# Patient Record
Sex: Male | Born: 1935 | Race: White | Hispanic: No | Marital: Married | Smoking: Never smoker
Health system: Southern US, Community
[De-identification: ages and names within clinical notes are randomized; demographics above are authoritative.]

## PROBLEM LIST (undated history)

## (undated) DIAGNOSIS — D126 Benign neoplasm of colon, unspecified: Secondary | ICD-10-CM

## (undated) DIAGNOSIS — Z8719 Personal history of other diseases of the digestive system: Secondary | ICD-10-CM

## (undated) DIAGNOSIS — I209 Angina pectoris, unspecified: Secondary | ICD-10-CM

## (undated) DIAGNOSIS — E1169 Type 2 diabetes mellitus with other specified complication: Secondary | ICD-10-CM

## (undated) DIAGNOSIS — M1990 Unspecified osteoarthritis, unspecified site: Secondary | ICD-10-CM

## (undated) DIAGNOSIS — K5790 Diverticulosis of intestine, part unspecified, without perforation or abscess without bleeding: Secondary | ICD-10-CM

## (undated) DIAGNOSIS — C61 Malignant neoplasm of prostate: Secondary | ICD-10-CM

## (undated) DIAGNOSIS — R011 Cardiac murmur, unspecified: Secondary | ICD-10-CM

## (undated) DIAGNOSIS — I2510 Atherosclerotic heart disease of native coronary artery without angina pectoris: Secondary | ICD-10-CM

## (undated) DIAGNOSIS — E785 Hyperlipidemia, unspecified: Secondary | ICD-10-CM

## (undated) DIAGNOSIS — K219 Gastro-esophageal reflux disease without esophagitis: Secondary | ICD-10-CM

## (undated) DIAGNOSIS — R29890 Loss of height: Secondary | ICD-10-CM

## (undated) HISTORY — DX: Diverticulosis of intestine, part unspecified, without perforation or abscess without bleeding: K57.90

## (undated) HISTORY — PX: PROSTATECTOMY: SHX69

## (undated) HISTORY — PX: COLONOSCOPY: SHX174

## (undated) HISTORY — DX: Malignant neoplasm of prostate: C61

## (undated) HISTORY — DX: Unspecified osteoarthritis, unspecified site: M19.90

## (undated) HISTORY — DX: Hyperlipidemia, unspecified: E78.5

## (undated) HISTORY — PX: TONSILLECTOMY: SHX5217

## (undated) HISTORY — DX: Loss of height: R29.890

## (undated) HISTORY — DX: Type 2 diabetes mellitus with other specified complication: E11.69

## (undated) HISTORY — DX: Benign neoplasm of colon, unspecified: D12.6

---
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
## 2004-07-27 ENCOUNTER — Ambulatory Visit: Payer: Self-pay | Admitting: Gastroenterology

## 2004-08-08 ENCOUNTER — Ambulatory Visit: Payer: Self-pay | Admitting: Gastroenterology

## 2007-10-12 ENCOUNTER — Telehealth (INDEPENDENT_AMBULATORY_CARE_PROVIDER_SITE_OTHER): Payer: Self-pay | Admitting: *Deleted

## 2007-10-15 ENCOUNTER — Ambulatory Visit: Payer: Self-pay | Admitting: Family Medicine

## 2007-10-15 DIAGNOSIS — R42 Dizziness and giddiness: Secondary | ICD-10-CM

## 2007-10-15 DIAGNOSIS — Z8546 Personal history of malignant neoplasm of prostate: Secondary | ICD-10-CM

## 2007-10-15 DIAGNOSIS — R030 Elevated blood-pressure reading, without diagnosis of hypertension: Secondary | ICD-10-CM | POA: Insufficient documentation

## 2007-10-15 HISTORY — DX: Dizziness and giddiness: R42

## 2007-10-15 HISTORY — DX: Personal history of malignant neoplasm of prostate: Z85.46

## 2007-11-11 ENCOUNTER — Ambulatory Visit: Payer: Self-pay | Admitting: Family Medicine

## 2007-11-17 ENCOUNTER — Ambulatory Visit: Payer: Self-pay | Admitting: Gastroenterology

## 2007-11-17 DIAGNOSIS — Z860100 Personal history of colon polyps, unspecified: Secondary | ICD-10-CM

## 2007-11-17 HISTORY — DX: Personal history of colonic polyps: Z86.010

## 2007-11-17 HISTORY — DX: Personal history of colon polyps, unspecified: Z86.0100

## 2007-11-26 ENCOUNTER — Ambulatory Visit: Payer: Self-pay | Admitting: Gastroenterology

## 2007-11-26 ENCOUNTER — Encounter: Payer: Self-pay | Admitting: Gastroenterology

## 2007-11-26 ENCOUNTER — Encounter: Payer: Self-pay | Admitting: Family Medicine

## 2007-11-26 LAB — HM COLONOSCOPY

## 2008-01-20 ENCOUNTER — Ambulatory Visit: Payer: Self-pay | Admitting: Internal Medicine

## 2008-01-20 ENCOUNTER — Telehealth: Payer: Self-pay | Admitting: Gastroenterology

## 2008-01-20 DIAGNOSIS — K5289 Other specified noninfective gastroenteritis and colitis: Secondary | ICD-10-CM | POA: Insufficient documentation

## 2008-01-20 HISTORY — DX: Other specified noninfective gastroenteritis and colitis: K52.89

## 2008-02-05 ENCOUNTER — Telehealth (INDEPENDENT_AMBULATORY_CARE_PROVIDER_SITE_OTHER): Payer: Self-pay | Admitting: *Deleted

## 2008-02-11 ENCOUNTER — Ambulatory Visit: Payer: Self-pay | Admitting: Family Medicine

## 2008-02-11 DIAGNOSIS — E559 Vitamin D deficiency, unspecified: Secondary | ICD-10-CM

## 2008-02-11 DIAGNOSIS — K449 Diaphragmatic hernia without obstruction or gangrene: Secondary | ICD-10-CM

## 2008-02-11 DIAGNOSIS — R011 Cardiac murmur, unspecified: Secondary | ICD-10-CM | POA: Insufficient documentation

## 2008-02-11 HISTORY — DX: Vitamin D deficiency, unspecified: E55.9

## 2008-02-11 HISTORY — DX: Diaphragmatic hernia without obstruction or gangrene: K44.9

## 2008-02-12 ENCOUNTER — Encounter (INDEPENDENT_AMBULATORY_CARE_PROVIDER_SITE_OTHER): Payer: Self-pay | Admitting: *Deleted

## 2008-02-13 LAB — CONVERTED CEMR LAB
ALT: 57 units/L — ABNORMAL HIGH (ref 0–53)
AST: 42 units/L — ABNORMAL HIGH (ref 0–37)
Albumin: 4.6 g/dL (ref 3.5–5.2)
CO2: 28 meq/L (ref 19–32)
Cholesterol: 214 mg/dL (ref 0–200)
GFR calc Af Amer: 92 mL/min
Glucose, Bld: 104 mg/dL — ABNORMAL HIGH (ref 70–99)
HDL: 28.6 mg/dL — ABNORMAL LOW (ref >39.0)
Lymphocytes Relative: 33.6 % (ref 12.0–46.0)
Monocytes Absolute: 0.4 10*3/uL (ref 0.1–1.0)
Monocytes Relative: 7.9 % (ref 3.0–12.0)
Neutrophils Relative %: 55.9 % (ref 43.0–77.0)
Platelets: 283 10*3/uL (ref 150–400)
Potassium: 4.9 meq/L (ref 3.5–5.1)
RDW: 12.7 % (ref 11.5–14.6)
Sodium: 146 meq/L — ABNORMAL HIGH (ref 135–145)
TSH: 5.05 microintl units/mL (ref 0.35–5.50)
Total CHOL/HDL Ratio: 7.5
Triglycerides: 230 mg/dL (ref 0–149)
VLDL: 46 mg/dL — ABNORMAL HIGH (ref 0–40)
Vit D, 1,25-Dihydroxy: 26 — ABNORMAL LOW (ref 30–89)

## 2008-02-15 ENCOUNTER — Encounter (INDEPENDENT_AMBULATORY_CARE_PROVIDER_SITE_OTHER): Payer: Self-pay | Admitting: *Deleted

## 2008-02-18 ENCOUNTER — Encounter: Payer: Self-pay | Admitting: Family Medicine

## 2008-02-18 ENCOUNTER — Ambulatory Visit: Payer: Self-pay

## 2008-02-22 ENCOUNTER — Telehealth (INDEPENDENT_AMBULATORY_CARE_PROVIDER_SITE_OTHER): Payer: Self-pay | Admitting: *Deleted

## 2009-09-18 ENCOUNTER — Encounter: Payer: Self-pay | Admitting: Family Medicine

## 2010-07-12 ENCOUNTER — Encounter: Payer: Self-pay | Admitting: Family Medicine

## 2010-07-12 ENCOUNTER — Ambulatory Visit: Payer: Self-pay | Admitting: Family Medicine

## 2010-07-12 DIAGNOSIS — D239 Other benign neoplasm of skin, unspecified: Secondary | ICD-10-CM | POA: Insufficient documentation

## 2010-07-12 DIAGNOSIS — M1990 Unspecified osteoarthritis, unspecified site: Secondary | ICD-10-CM

## 2010-07-12 HISTORY — DX: Other benign neoplasm of skin, unspecified: D23.9

## 2010-07-12 HISTORY — DX: Unspecified osteoarthritis, unspecified site: M19.90

## 2010-07-12 LAB — CONVERTED CEMR LAB
Bilirubin Urine: NEGATIVE
Ketones, urine, test strip: NEGATIVE
Nitrite: NEGATIVE
Specific Gravity, Urine: 1.02
Urobilinogen, UA: NEGATIVE

## 2010-07-13 ENCOUNTER — Telehealth: Payer: Self-pay | Admitting: Family Medicine

## 2010-07-13 LAB — CONVERTED CEMR LAB
Albumin: 4.3 g/dL (ref 3.5–5.2)
Alkaline Phosphatase: 66 units/L (ref 39–117)
Basophils Absolute: 0 10*3/uL (ref 0.0–0.1)
Basophils Relative: 0.3 % (ref 0.0–3.0)
CO2: 26 meq/L (ref 19–32)
Chloride: 105 meq/L (ref 96–112)
Eosinophils Absolute: 0.1 10*3/uL (ref 0.0–0.7)
Glucose, Bld: 87 mg/dL (ref 70–99)
HCT: 47.7 % (ref 39.0–52.0)
Hemoglobin: 16.4 g/dL (ref 13.0–17.0)
Lymphocytes Relative: 28.2 % (ref 12.0–46.0)
Lymphs Abs: 1.6 10*3/uL (ref 0.7–4.0)
MCHC: 34.3 g/dL (ref 30.0–36.0)
MCV: 92.6 fL (ref 78.0–100.0)
Monocytes Absolute: 0.4 10*3/uL (ref 0.1–1.0)
Neutro Abs: 3.5 10*3/uL (ref 1.4–7.7)
PSA: 0.01 ng/mL — ABNORMAL LOW (ref 0.10–4.00)
RBC: 5.15 M/uL (ref 4.22–5.81)
RDW: 12.9 % (ref 11.5–14.6)
Sodium: 139 meq/L (ref 135–145)
Total CHOL/HDL Ratio: 5
Total Protein: 7 g/dL (ref 6.0–8.3)
Triglycerides: 171 mg/dL — ABNORMAL HIGH (ref 0.0–149.0)
Vit D, 25-Hydroxy: 35 ng/mL (ref 30–89)

## 2010-07-18 ENCOUNTER — Encounter: Payer: Self-pay | Admitting: Family Medicine

## 2010-07-22 HISTORY — PX: EYE SURGERY: SHX253

## 2010-08-21 NOTE — Letter (Signed)
Summary: [facility]  [facility]   Imported By: Lanelle Bal 09/26/2009 12:02:07  _____________________________________________________________________  External Attachment:    Type:   Image     Comment:   External Document

## 2010-08-23 NOTE — Assessment & Plan Note (Signed)
Summary: EST MEDICARE//PH   Vital Signs:  Patient profile:   77 year old male Height:      70 inches Weight:      188.8 pounds BMI:     27.19 Pulse rate:   64 / minute Pulse rhythm:   regular BP sitting:   118 / 80  (left arm) Cuff size:   regular  Vitals Entered By: Almeta Monas CMA Duncan Dull) (July 12, 2010 9:48 AM) CC: cpx/fasting---no problems  Does patient need assistance? Functional Status Self care, Cook/clean, Shopping, Social activities Ambulation Normal Comments pt is able to do all adls and can read and write  Vision Screening:      Vision Comments: + cataracts---sees Dr Hazle Quant 40db HL: Left  Right  Audiometry Comment: grossly normal    History of Present Illness: Pt here for cpe and labs.  [facility] and GE exam.  Pt seeing Dr Hazle Quant for cataracts.     Preventive Screening-Counseling & Management  Alcohol-Tobacco     Alcohol drinks/day: 0     Smoking Status: never  Caffeine-Diet-Exercise     Caffeine use/day: 2     Does Patient Exercise: no     Exercise Counseling: to improve exercise regimen  Hep-HIV-STD-Contraception     Dental Visit-last 6 months yes     Dental Care Counseling: not indicated; dental care within six months  Safety-Violence-Falls     Firearms in the Home: firearms in the home     Firearm Counseling: not indicated; uses recommended firearm safety measures     Smoke Detectors: yes     Smoke Detector Counseling: n/a     Violence in the Home: no risk noted     Sexual Abuse: no     Fall Risk: no      Sexual History:  currently monogamous.    Problems Prior to Update: 1)  Preventive Health Care  (ICD-V70.0) 2)  Osteoarthritis  (ICD-715.90) 3)  Unspecified Vitamin D Deficiency  (ICD-268.9) 4)  Cardiac Murmur  (ICD-785.2) 5)  Hiatal Hernia With Reflux  (ICD-553.3) 6)  Gastroenteritis  (ICD-558.9) 7)  Personal History of Colonic Polyps  (ICD-V12.72) 8)  Elevated Blood Pressure Without Diagnosis of  Hypertension  (ICD-796.2) 9)  Dizziness  (ICD-780.4) 10)  Prostate Cancer, Hx of  (ICD-V10.46)  Medications Prior to Update: 1)  Baby Aspirin 81 Mg  Chew (Aspirin) .[name].. 1 By Mouth Once Daily 2)  Fish Oil   Oil (Fish Oil) .[name].. 1 Tablet Once Daily 3)  Meclizine Hcl 25 Mg  Tabs (Meclizine Hcl) .[name].. 1 Q 6 Hrs As Needed Dizziness 4)  Prevacid 30 Mg  Cpdr (Lansoprazole) .[name].. 1 By Mouth Once Daily 5)  Vitamin D 04540 Unit  Caps (Ergocalciferol) .... Take One Capsule Weekly.  Current Medications (verified): 1)  Baby Aspirin 81 Mg  Chew (Aspirin) .[name].. 1 By Mouth Once Daily 2)  Caltrate 600 1500 Mg Tabs (Calcium Carbonate) .... By Mouth Once Daily 3)  Osteo Bi-Flex Regular Strength 250-200 Mg Tabs (Glucosamine-Chondroitin) .... By Mouth Once Daily 4)  Multivitamins  Caps (Multiple Vitamin) .... By Mouth Once Daily 5)  Zostavax 98119 Unt/0.32ml Solr (Zoster Vaccine Live) .[name].. 1 Ml Im X1 6)  Boostrix 5-2.5-18.5 Susp (Tetanus-Diphth-Acell Pertussis) .... Im X1  Allergies (verified): 1)  ! Pcn  Past History:  Past Surgical History: Last updated: 10/15/2007 Appendectomy Prostatectomy Tonsillectomy  Family History: Last updated: 10/15/2007 Family History Lung cancer Family History of Prostate CA 1st degree relative <50  Social History: Last updated: 10/15/2007 Married Never Smoked Alcohol use-no  Risk Factors: Alcohol Use: 0 (07/12/2010) Caffeine Use: 2 (07/12/2010) Exercise: no (07/12/2010)  Risk Factors: Smoking Status: never (07/12/2010)  Past Medical History: Prostate cancer, hx of Osteoarthritis  Family History: Reviewed history from 10/15/2007 and no changes required. Family History Lung cancer Family History of Prostate CA 1st degree relative <50  Social History: Reviewed history from 10/15/2007 and no changes required. Married Never Smoked Alcohol use-no Does Patient Exercise:  no Caffeine use/day:  2 Dental Care w/in 6 mos.:  yes Fall Risk:  no Sexual  History:  currently monogamous  Review of Systems      See HPI General:  Denies chills, fatigue, fever, loss of appetite, malaise, sleep disorder, sweats, weakness, and weight loss. Eyes:  Complains of blurring; denies discharge, double vision, eye irritation, eye pain, halos, itching, light sensitivity, red eye, vision loss-1 eye, and vision loss-both eyes. ENT:  Denies decreased hearing, difficulty swallowing, ear discharge, earache, hoarseness, nasal congestion, nosebleeds, postnasal drainage, ringing in ears, sinus pressure, and sore throat. CV:  Denies bluish discoloration of lips or nails, chest pain or discomfort, difficulty breathing at night, difficulty breathing while lying down, fainting, fatigue, leg cramps with exertion, lightheadness, near fainting, palpitations, shortness of breath with exertion, swelling of feet, swelling of hands, and weight gain. Resp:  Denies chest discomfort, chest pain with inspiration, cough, coughing up blood, excessive snoring, hypersomnolence, morning headaches, pleuritic, shortness of breath, sputum productive, and wheezing. GI:  Denies abdominal pain, bloody stools, change in bowel habits, constipation, dark tarry stools, diarrhea, excessive appetite, gas, hemorrhoids, indigestion, loss of appetite, nausea, vomiting, vomiting blood, and yellowish skin color. GU:  Denies decreased libido, discharge, dysuria, erectile dysfunction, genital sores, hematuria, incontinence, nocturia, urinary frequency, and urinary hesitancy. MS:  Denies joint pain, joint redness, joint swelling, loss of strength, low back pain, mid back pain, muscle aches, muscle , cramps, muscle weakness, stiffness, and thoracic pain. Derm:  Denies changes in color of skin, changes in nail beds, dryness, excessive perspiration, flushing, hair loss, insect bite(s), itching, lesion(s), poor wound healing, and rash. Psych:  Denies alternate hallucination ( auditory/visual), anxiety, depression, easily  angered, easily tearful, irritability, mental problems, panic attacks, sense of great danger, suicidal thoughts/plans, thoughts of violence, unusual visions or sounds, and thoughts /plans of harming others. Endo:  Denies cold intolerance, excessive hunger, excessive thirst, excessive urination, heat intolerance, polyuria, and weight change. Heme:  Denies abnormal bruising, bleeding, enlarge lymph nodes, fevers, pallor, and skin discoloration. Allergy:  Denies hives or rash, itching eyes, persistent infections, seasonal allergies, and sneezing.   Impression & Recommendations:  Problem # 1:  PREVENTIVE HEALTH CARE (ICD-V70.0) ghm utd Orders: Venipuncture (16109) TLB-Lipid Panel (80061-LIPID) TLB-BMP (Basic Metabolic Panel-BMET) (80048-METABOL) TLB-CBC Platelet - w/Differential (85025-CBCD) TLB-Hepatic/Liver Function Pnl (80076-HEPATIC) TLB-PSA (Prostate Specific Antigen) (84153-PSA) UA Dipstick W/ Micro (manual) (60454) Medicare -1st Annual Wellness Visit [phone]) EKG w/ Interpretation (93000)  Reviewed preventive care protocols, scheduled due services, and updated immunizations.  Problem # 2:  UNSPECIFIED VITAMIN D DEFICIENCY (ICD-268.9)  Orders: Venipuncture (91478) TLB-Lipid Panel (80061-LIPID) TLB-BMP (Basic Metabolic Panel-BMET) (80048-METABOL) TLB-CBC Platelet - w/Differential (85025-CBCD) TLB-Hepatic/Liver Function Pnl (80076-HEPATIC) TLB-PSA (Prostate Specific Antigen) (84153-PSA) T-Vitamin D (25-Hydroxy) (29562-13086) Specimen Handling (57846)  Problem # 3:  PROSTATE CANCER, HX OF (ICD-V10.46)  Orders: Venipuncture (96295) TLB-Lipid Panel (80061-LIPID) TLB-BMP (Basic Metabolic Panel-BMET) (80048-METABOL) TLB-CBC Platelet - w/Differential (85025-CBCD) TLB-Hepatic/Liver Function Pnl (80076-HEPATIC) TLB-PSA (Prostate Specific Antigen) (84153-PSA) T-Vitamin D (25-Hydroxy) (28413-24401) Venipuncture (02725) TLB-Lipid  Panel (80061-LIPID) TLB-BMP (Basic Metabolic  Panel-BMET) (80048-METABOL) TLB-CBC Platelet - w/Differential (85025-CBCD) TLB-Hepatic/Liver Function Pnl (80076-HEPATIC) TLB-PSA (Prostate Specific Antigen) (84153-PSA) Specimen Handling (99000)  Complete Medication List: 1)  Baby Aspirin 81 Mg Chew (Aspirin) .[name].. 1 by mouth once daily 2)  Caltrate 600 1500 Mg Tabs (Calcium carbonate) .... By mouth once daily 3)  Osteo Bi-flex Regular Strength 250-200 Mg Tabs (Glucosamine-chondroitin) .... By mouth once daily 4)  Multivitamins Caps (Multiple vitamin) .... By mouth once daily 5)  Zostavax 43329 Unt/0.99ml Solr (Zoster vaccine live) .[name].. 1 ml im x1 6)  Boostrix 5-2.5-18.5 Susp (Tetanus-diphth-acell pertussis) .... Im x1 Prescriptions: BOOSTRIX 5-2.5-18.5 SUSP (TETANUS-DIPHTH-ACELL PERTUSSIS) IM x1  #1 x 0   Entered and Authorized by:   Loreen Freud DO   Signed by:   Loreen Freud DO on 07/12/2010   Method used:   Print then Give to Patient   RxID:   [id number] ZOSTAVAX 09323 UNT/0.65ML SOLR (ZOSTER VACCINE LIVE) 1 ml IM x1  #1 x 0   Entered and Authorized by:   Loreen Freud DO   Signed by:   Loreen Freud DO on 07/12/2010   Method used:   Print then Give to Patient   RxID:   [id number]    Orders Added: 1)  Venipuncture [76283] 2)  TLB-Lipid Panel [80061-LIPID] 3)  TLB-BMP (Basic Metabolic Panel-BMET) [80048-METABOL] 4)  TLB-CBC Platelet - w/Differential [85025-CBCD] 5)  TLB-Hepatic/Liver Function Pnl [80076-HEPATIC] 6)  TLB-PSA (Prostate Specific Antigen) [84153-PSA] 7)  T-Vitamin D (25-Hydroxy) [15176-16073] 8)  Venipuncture [36415] 9)  TLB-Lipid Panel [80061-LIPID] 10)  TLB-BMP (Basic Metabolic Panel-BMET) [80048-METABOL] 11)  TLB-CBC Platelet - w/Differential [85025-CBCD] 12)  TLB-Hepatic/Liver Function Pnl [80076-HEPATIC] 13)  TLB-PSA (Prostate Specific Antigen) [71062-IRS] 14)  Specimen Handling [99000] 15)  UA Dipstick W/ Micro (manual) [81000] 16)  Medicare -1st Annual Wellness Visit [G0438] 17)  EKG w/  Interpretation [93000]    Flu Vaccine Result Date:  05/09/2010 Flu Vaccine Result:  given Flu Vaccine Next Due:  1 yr Pneumovax Result Date:  07/03/2005 Pneumovax Result:  given    Laboratory Results   Urine Tests    Routine Urinalysis   Color: yellow Appearance: Clear Glucose: negative   (Normal Range: Negative) Bilirubin: negative   (Normal Range: Negative) Ketone: negative   (Normal Range: Negative) Spec. Gravity: 1.020   (Normal Range: 1.003-1.035) Blood: negative   (Normal Range: Negative) pH: 5.0   (Normal Range: 5.0-8.0) Protein: negative   (Normal Range: Negative) Urobilinogen: negative   (Normal Range: 0-1) Nitrite: negative   (Normal Range: Negative) Leukocyte Esterace: negative   (Normal Range: Negative)    Comments: Floydene Flock  July 12, 2010 11:34 AM      Appended Document: EST MEDICARE//PH     Allergies: 1)  ! Pcn  Physical Exam  Skin:  + black mole on back x2 +2 dark brown moles on nose--new    Impression & Recommendations:  Problem # 4:  NEVI, MULTIPLE (ICD-216.9) avoid sun  use sunscreen f/u derm  Complete Medication List: 1)  Baby Aspirin 81 Mg Chew (Aspirin) .[name].. 1 by mouth once daily 2)  Caltrate 600 1500 Mg Tabs (Calcium carbonate) .... By mouth once daily 3)  Osteo Bi-flex Regular Strength 250-200 Mg Tabs (Glucosamine-chondroitin) .... By mouth once daily 4)  Multivitamins Caps (Multiple vitamin) .... By mouth once daily 5)  Zostavax 85462 Unt/0.47ml Solr (Zoster vaccine live) .[name].. 1 ml im x1 6)  Boostrix 5-2.5-18.5 Susp (Tetanus-diphth-acell pertussis) .... Im x1  Appended Document: EST  MEDICARE//PH     Allergies: 1)  ! Pcn  Physical Exam  General:  Well-developed,well-nourished,in no acute distress; alert,appropriate and cooperative throughout examination Head:  Normocephalic and atraumatic without obvious abnormalities. No apparent alopecia or balding. Eyes:  pupils equal, pupils round, pupils reactive to  light, and no injection.   Ears:  External ear exam shows no significant lesions or deformities.  Otoscopic examination reveals clear canals, tympanic membranes are intact bilaterally without bulging, retraction, inflammation or discharge. Hearing is grossly normal bilaterally. Nose:  External nasal examination shows no deformity or inflammation. Nasal mucosa are pink and moist without lesions or exudates. Mouth:  Oral mucosa and oropharynx without lesions or exudates.  Teeth in good repair. Neck:  No deformities, masses, or tenderness noted. Lungs:  Normal respiratory effort, chest expands symmetrically. Lungs are clear to auscultation, no crackles or wheezes. Heart:  normal rate and Grade  2 /6 systolic ejection murmur.   Abdomen:  Bowel sounds positive,abdomen soft and non-tender without masses, organomegaly or hernias noted. Rectal:  per urololgy Genitalia:  per urology Prostate:  per urology Msk:  normal ROM, no joint tenderness, no joint swelling, no joint warmth, no redness over joints, no joint deformities, no joint instability, and no crepitation.   Pulses:  R posterior tibial normal, R dorsalis pedis normal, R carotid normal, L posterior tibial normal, L dorsalis pedis normal, and L carotid normal.   Extremities:  No clubbing, cyanosis, edema, or deformity noted with normal full range of motion of all joints.   Neurologic:  No cranial nerve deficits noted. Station and gait are normal. Plantar reflexes are down-going bilaterally. DTRs are symmetrical throughout. Sensory, motor and coordinative functions appear intact. Skin:  Intact without suspicious lesions or rashes Cervical Nodes:  No lymphadenopathy noted Psych:  Oriented X3, memory intact for recent and remote, normally interactive, good eye contact, not anxious appearing, not depressed appearing, and not suicidal.     Complete Medication List: 1)  Baby Aspirin 81 Mg Chew (Aspirin) .[name].. 1 by mouth once daily 2)  Caltrate 600 1500  Mg Tabs (Calcium carbonate) .... By mouth once daily 3)  Osteo Bi-flex Regular Strength 250-200 Mg Tabs (Glucosamine-chondroitin) .... By mouth once daily 4)  Multivitamins Caps (Multiple vitamin) .... By mouth once daily 5)  Zostavax 16109 Unt/0.103ml Solr (Zoster vaccine live) .[name].. 1 ml im x1 6)  Boostrix 5-2.5-18.5 Susp (Tetanus-diphth-acell pertussis) .... Im x1

## 2010-08-23 NOTE — Consult Note (Signed)
Summary: [facility]  [facility]   Imported By: Lanelle Bal 08/17/2010 10:32:12  _____________________________________________________________________  External Attachment:    Type:   Image     Comment:   External Document

## 2010-08-23 NOTE — Progress Notes (Signed)
Summary: Results-FYI  Phone Note Outgoing Call   Call placed by: Almeta Monas CMA Duncan Dull),  July 13, 2010 11:29 AM Call placed to: Patient Details for Reason: Ideally your LDL (bad cholesterol) should be <_100___, your HDL (good cholesterol) should be >_40__ and your triglycerides should be< 150.  Diet and exercise will increase HDL and decrease the LDL and triglycerides. Read Dr. Danice Goltz book--Eat Drink and Be Healthy.  Start Lipitor 10 mg #30 1 by mouth  at bedtime,  2 refills.   We will recheck labs in _3__ months.   lipids, hep 272.4  Summary of Call: I spoke with the patient and he stated that he would rather do some home remedies before he started any meds, I advised of the actual numbers and explained the importance of the meds and high risk of stroke and heart attack. Pt declined meds and wanted to try his home remedy first. Stated he would call back if he changed his mind. He said he would f/u in 3 mos as directed..... Almeta Monas CMA Duncan Dull)  July 13, 2010 11:33 AM

## 2010-10-08 ENCOUNTER — Other Ambulatory Visit (INDEPENDENT_AMBULATORY_CARE_PROVIDER_SITE_OTHER): Payer: Medicare Other

## 2010-10-08 ENCOUNTER — Encounter: Payer: Self-pay | Admitting: *Deleted

## 2010-10-08 ENCOUNTER — Other Ambulatory Visit: Payer: Self-pay | Admitting: Family Medicine

## 2010-10-08 DIAGNOSIS — E785 Hyperlipidemia, unspecified: Secondary | ICD-10-CM

## 2010-10-08 LAB — LDL CHOLESTEROL, DIRECT: Direct LDL: 165.3 mg/dL

## 2010-10-08 LAB — LIPID PANEL
Cholesterol: 232 mg/dL — ABNORMAL HIGH (ref 0–200)
Total CHOL/HDL Ratio: 5
Triglycerides: 198 mg/dL — ABNORMAL HIGH (ref 0.0–149.0)
VLDL: 39.6 mg/dL (ref 0.0–40.0)

## 2010-10-08 LAB — HEPATIC FUNCTION PANEL
AST: 30 U/L (ref 0–37)
Albumin: 4.5 g/dL (ref 3.5–5.2)
Alkaline Phosphatase: 64 U/L (ref 39–117)
Total Bilirubin: 0.7 mg/dL (ref 0.3–1.2)

## 2010-12-12 ENCOUNTER — Encounter: Payer: Self-pay | Admitting: Gastroenterology

## 2011-01-05 ENCOUNTER — Encounter: Payer: Self-pay | Admitting: Family Medicine

## 2011-01-15 ENCOUNTER — Encounter: Payer: Self-pay | Admitting: Family Medicine

## 2011-01-15 ENCOUNTER — Other Ambulatory Visit (INDEPENDENT_AMBULATORY_CARE_PROVIDER_SITE_OTHER): Payer: Medicare Other | Admitting: Family Medicine

## 2011-01-15 ENCOUNTER — Ambulatory Visit (INDEPENDENT_AMBULATORY_CARE_PROVIDER_SITE_OTHER): Payer: Medicare Other | Admitting: Family Medicine

## 2011-01-15 VITALS — BP 120/80 | HR 67 | Temp 99.5°F | Wt 186.0 lb

## 2011-01-15 DIAGNOSIS — L719 Rosacea, unspecified: Secondary | ICD-10-CM

## 2011-01-15 DIAGNOSIS — E785 Hyperlipidemia, unspecified: Secondary | ICD-10-CM

## 2011-01-15 LAB — HEPATIC FUNCTION PANEL
ALT: 32 U/L (ref 0–53)
Alkaline Phosphatase: 62 U/L (ref 39–117)
Bilirubin, Direct: 0.1 mg/dL (ref 0.0–0.3)
Total Protein: 7 g/dL (ref 6.0–8.3)

## 2011-01-15 LAB — LIPID PANEL
HDL: 44.1 mg/dL (ref >39.00)
Total CHOL/HDL Ratio: 4

## 2011-01-15 LAB — BASIC METABOLIC PANEL
CO2: 25 mEq/L (ref 19–32)
Calcium: 9.5 mg/dL (ref 8.4–10.5)
Chloride: 109 mEq/L (ref 96–112)
Potassium: 4.3 mEq/L (ref 3.5–5.1)
Sodium: 142 mEq/L (ref 135–145)

## 2011-01-15 MED ORDER — OCUVITE EYE HEALTH FORMULA PO CAPS: 16.0000 mg | ORAL_CAPSULE | ORAL | Status: DC

## 2011-01-15 MED ORDER — METRONIDAZOLE 0.75 % EX GEL: Freq: Two times a day (BID) | CUTANEOUS | Status: AC

## 2011-01-15 NOTE — Progress Notes (Signed)
  Subjective:    Evan Wright is a 77 y.o. male here for follow up of dyslipidemia. The patient does not use medications that may worsen dyslipidemias (corticosteroids, progestins, anabolic steroids, diuretics, beta-blockers, amiodarone, cyclosporine, olanzapine). The patient exercises occasionally. The patient is not known to have coexisting coronary artery disease.   Cardiac Risk Factors Age > 45-male, > 55-male:  YES  +1  Smoking:   NO  Sig. family hx of CHD*:  NO  Hypertension:   NO  Diabetes:   NO  HDL < 35:   NO  HDL > 59:   NO  Total: 1   *- Sig. family h/o CHD per NCEP = MI or sudden death at <55yo in  father or other 1st-degree male relative, or <65yo in mother or  other 1st-degree male relative  The following portions of the patient's history were reviewed and updated as appropriate: allergies, current medications, past family history, past medical history, past social history, past surgical history and problem list.  Review of Systems Pertinent items are noted in HPI.    Objective:    BP 120/80  Pulse 67  Temp(Src) 99.5 F (37.5 C) (Oral)  Wt 186 lb (84.369 kg)  SpO2 98% General appearance: alert, cooperative, appears stated age and no distress Neck: no adenopathy, no carotid bruit, no JVD, supple, symmetrical, trachea midline and thyroid not enlarged, symmetric, no tenderness/mass/nodules Lungs: clear to auscultation bilaterally Heart: murmur  2/6 Extremities: extremities normal, atraumatic, no cyanosis or edema psy--AAO x3  nad  Lab Review Lab Results  Component Value Date   CHOL 232* 10/08/2010   CHOL 213* 07/12/2010   CHOL 214* 02/11/2008   HDL 44.30 10/08/2010   HDL 08.65 07/12/2010   HDL 28.6* 02/11/2008   LDLDIRECT 165.3 10/08/2010   LDLDIRECT 157.2 07/12/2010      Assessment:    Dyslipidemia as detailed above with 1 CHD risk factors using NCEP scheme above.  Target levels for LDL are: < 100 mg/dl (CHD or "CHD risk equivalent" is  present)  Explained to the patient the respective contributions of genetics, diet, and exercise to lipid levels and the use of medication in severe cases which do not respond to lifestyle alteration. The patient's interest and motivation in making lifestyle changes seems good.    Plan:    The following changes are planned for the next 6 months, at which time the patient will return for repeat fasting lipids:  1. Dietary changes: Increase soluble fiber Plant sterols 2grams per day (e.g. Benecol): yes Reduce saturated fat, "trans" monounsaturated fatty acids, and cholesterol Referral to nutritionist 2. Exercise changes:  increase physical exercise 3. Other treatment: None 4. Lipid-lowering medications: zocor 10 mg  (Recommended by NCEP after 3-6 mos of dietary therapy & lifestyle modification,  except if CHD is present or LDL well above 190.) 5. Hormone replacement therapy (patient is a postmenopausal  woman): no 6. Screening for secondary causes of dyslipidemias: None indicated 7. Lipid screening for relatives: na 8. Follow up: 6 months.  Note: The majority of the visit was spent in counseling on the pathophysiology and treatment of dyslipidemias. The total face-to-face time was in excess of 20 minutes.

## 2011-01-15 NOTE — Patient Instructions (Signed)

## 2011-01-24 ENCOUNTER — Telehealth: Payer: Self-pay | Admitting: Family Medicine

## 2011-01-24 NOTE — Telephone Encounter (Signed)
Left msg for pt to return call.

## 2011-01-24 NOTE — Telephone Encounter (Signed)
Discussed with [name] and she stated [name] had been cutting his pills in half, she stated she will tell him to take the full dose and call back to schedule the recheck     KP

## 2011-01-24 NOTE — Telephone Encounter (Signed)
Message copied by Doristine Devoid on Thu Jan 24, 2011 10:11 AM ------      Message from: Lelon Perla      Created: Tue Jan 22, 2011  4:55 PM       Cholesterol--- LDL goal < 100,  HDL >40,  TG < 150.  Diet and exercise will increase HDL and decrease LDL and TG.  Fish,  Fish Oil, Flaxseed oil will also help increase the HDL and decrease Triglycerides.   Recheck labs in 3 months.       Please increase zocor to 20 mg a day-------272.4  Lipid, hep

## 2011-03-05 ENCOUNTER — Other Ambulatory Visit: Payer: Self-pay | Admitting: Family Medicine

## 2011-03-05 MED ORDER — SIMVASTATIN 20 MG PO TABS: 20.0000 mg | ORAL_TABLET | Freq: Every day | ORAL | Status: DC

## 2011-03-05 NOTE — Telephone Encounter (Signed)
Rx sent 

## 2011-05-14 ENCOUNTER — Ambulatory Visit (INDEPENDENT_AMBULATORY_CARE_PROVIDER_SITE_OTHER): Payer: Medicare Other | Admitting: Gastroenterology

## 2011-05-14 ENCOUNTER — Encounter: Payer: Self-pay | Admitting: Gastroenterology

## 2011-05-14 DIAGNOSIS — Z86010 Personal history of colonic polyps: Secondary | ICD-10-CM

## 2011-05-14 DIAGNOSIS — Z800 Family history of malignant neoplasm of digestive organs: Secondary | ICD-10-CM

## 2011-05-14 HISTORY — DX: Family history of malignant neoplasm of digestive organs: Z80.0

## 2011-05-14 NOTE — Progress Notes (Signed)
Evan Wright is a pleasant 77 year old white male with family history of colon cancer and personal history of colon polyps and prostate cancer here to set a followup colonoscopy. Colonoscopy in 2006 and 2001 demonstrated multiple adenomatous polyps. He has no GI complaints including change in bowel habits, abdominal pain, melena or hematochezia. Because of multiplicity of polyps 3 year followup was recommended. Family history is pertinent for his brother who had colon cancer in his early 66s.      Past Medical History  Diagnosis Date  . Prostate cancer   . Osteoarthritis   . Hyperlipidemia   . Colon polyps   . Diverticulosis    Past Surgical History  Procedure Date  . Appendectomy   . Prostatectomy   . Tonsillectomy   . Eye surgery 2012    march and 11/28/2022  --Dr Hazle Quant    reports that he has never smoked. He does not have any smokeless tobacco history on file. He reports that he does not drink alcohol. His drug history not on file. family history includes Colon cancer in his brother; Lung cancer in an unspecified family member; and Prostate cancer in an unspecified family member.  Current medications and social history were reviewed in [facility] electronic medical record  Review of Systems: Pertinent positive and negative review of systems were noted in the above HPI section. All other review of systems were otherwise negative.  Vital signs were reviewed in today's medical record Physical Exam: General: Well developed , well nourished, no acute distress Head: Normocephalic and atraumatic Eyes:  sclerae anicteric, EOMI Ears: Normal auditory acuity Mouth: No deformity or lesions Neck: Supple, no masses or thyromegaly Lungs: Clear throughout to auscultation Heart: Regular rate and rhythm; no murmurs, rubs or bruits Abdomen: Soft, non tender and non distended. No masses, hepatosplenomegaly or hernias noted. Normal Bowel sounds Rectal:deferred Musculoskeletal: Symmetrical with no  gross deformities  Skin: No lesions on visible extremities Pulses:  Normal pulses noted Extremities: No clubbing, cyanosis, edema or deformities noted Neurological: Alert oriented x 4, grossly nonfocal Cervical Nodes:  No significant cervical adenopathy Inguinal Nodes: No significant inguinal adenopathy Psychological:  Alert and cooperative. Normal mood and affect

## 2011-05-14 NOTE — Patient Instructions (Signed)
As requested by Dr Arlyce Dice, You are to schedule your follow up colonoscopy Please call back to schedule when you decide on your time and date When you call back to schedule you will be scheduled for a Previsit with a nurse at no-charge to sign paperwork and get all information needed for your procedure

## 2011-05-14 NOTE — Assessment & Plan Note (Signed)
Plan followup colonoscopy 

## 2011-07-30 ENCOUNTER — Other Ambulatory Visit: Payer: Self-pay | Admitting: Family Medicine

## 2011-07-30 MED ORDER — SIMVASTATIN 20 MG PO TABS: 20.0000 mg | ORAL_TABLET | Freq: Every day | ORAL | Status: DC

## 2011-07-30 NOTE — Telephone Encounter (Signed)
Letter mailed to schedule a lab apt.     KP 

## 2011-08-22 ENCOUNTER — Other Ambulatory Visit: Payer: Self-pay | Admitting: Family Medicine

## 2011-08-22 DIAGNOSIS — E785 Hyperlipidemia, unspecified: Secondary | ICD-10-CM

## 2011-08-23 ENCOUNTER — Other Ambulatory Visit (INDEPENDENT_AMBULATORY_CARE_PROVIDER_SITE_OTHER): Payer: Medicare Other

## 2011-08-23 DIAGNOSIS — E785 Hyperlipidemia, unspecified: Secondary | ICD-10-CM

## 2011-08-23 LAB — LIPID PANEL
Cholesterol: 182 mg/dL (ref 0–200)
Triglycerides: 183 mg/dL — ABNORMAL HIGH (ref 0.0–149.0)

## 2011-08-23 LAB — HEPATIC FUNCTION PANEL
ALT: 34 U/L (ref 0–53)
AST: 30 U/L (ref 0–37)
Albumin: 4.4 g/dL (ref 3.5–5.2)
Alkaline Phosphatase: 64 U/L (ref 39–117)
Total Protein: 7.3 g/dL (ref 6.0–8.3)

## 2011-09-03 ENCOUNTER — Ambulatory Visit (AMBULATORY_SURGERY_CENTER): Payer: Medicare Other | Admitting: *Deleted

## 2011-09-03 VITALS — Ht 72.0 in | Wt 184.0 lb

## 2011-09-03 DIAGNOSIS — Z1211 Encounter for screening for malignant neoplasm of colon: Secondary | ICD-10-CM

## 2011-09-03 MED ORDER — PEG-KCL-NACL-NASULF-NA ASC-C 100 G PO SOLR: ORAL | Status: DC

## 2011-09-17 ENCOUNTER — Encounter: Payer: Self-pay | Admitting: Gastroenterology

## 2011-09-17 ENCOUNTER — Ambulatory Visit (AMBULATORY_SURGERY_CENTER): Payer: Medicare Other | Admitting: Gastroenterology

## 2011-09-17 DIAGNOSIS — Z800 Family history of malignant neoplasm of digestive organs: Secondary | ICD-10-CM

## 2011-09-17 DIAGNOSIS — D126 Benign neoplasm of colon, unspecified: Secondary | ICD-10-CM

## 2011-09-17 DIAGNOSIS — Z1211 Encounter for screening for malignant neoplasm of colon: Secondary | ICD-10-CM

## 2011-09-17 MED ORDER — SODIUM CHLORIDE 0.9 % IV SOLN: 500.0000 mL | INTRAVENOUS | Status: DC

## 2011-09-17 NOTE — Patient Instructions (Signed)
YOU HAD AN ENDOSCOPIC PROCEDURE TODAY AT THE [facility]: Refer to the procedure report that was given to you for any specific questions about what was found during the examination.  If the procedure report does not answer your questions, please call your gastroenterologist to clarify.  If you requested that your care partner not be given the details of your procedure findings, then the procedure report has been included in a sealed envelope for you to review at your convenience later.  YOU SHOULD EXPECT: Some feelings of bloating in the abdomen. Passage of more gas than usual.  Walking can help get rid of the air that was put into your GI tract during the procedure and reduce the bloating. If you had a lower endoscopy (such as a colonoscopy or flexible sigmoidoscopy) you may notice spotting of blood in your stool or on the toilet paper. If you underwent a bowel prep for your procedure, then you may not have a normal bowel movement for a few days.  DIET: Your first meal following the procedure should be a light meal and then it is ok to progress to your normal diet.  A half-sandwich or bowl of soup is an example of a good first meal.  Heavy or fried foods are harder to digest and may make you feel nauseous or bloated.  Likewise meals heavy in dairy and vegetables can cause extra gas to form and this can also increase the bloating.  Drink plenty of fluids but you should avoid alcoholic beverages for 24 hours.  ACTIVITY: Your care partner should take you home directly after the procedure.  You should plan to take it easy, moving slowly for the rest of the day.  You can resume normal activity the day after the procedure however you should NOT DRIVE or use heavy machinery for 24 hours (because of the sedation medicines used during the test).    SYMPTOMS TO REPORT IMMEDIATELY: A gastroenterologist can be reached at any hour.  During normal business hours, 8:30 AM to 5:00 PM Monday through Friday,  call [phone].  After hours and on weekends, please call the GI answering service at [phone] who will take a message and have the physician on call contact you.   Following lower endoscopy (colonoscopy or flexible sigmoidoscopy):  Excessive amounts of blood in the stool  Significant tenderness or worsening of abdominal pains  Swelling of the abdomen that is new, acute  Fever of 100F or higher    FOLLOW UP: If any biopsies were taken you will be contacted by phone or by letter within the next 1-3 weeks.  Call your gastroenterologist if you have not heard about the biopsies in 3 weeks.  Our staff will call the home number listed on your records the next business day following your procedure to check on you and address any questions or concerns that you may have at that time regarding the information given to you following your procedure. This is a courtesy call and so if there is no answer at the home number and we have not heard from you through the emergency physician on call, we will assume that you have returned to your regular daily activities without incident.  SIGNATURES/CONFIDENTIALITY: You and/or your care partner have signed paperwork which will be entered into your electronic medical record.  These signatures attest to the fact that that the information above on your After Visit Summary has been reviewed and is understood.  Full responsibility of the confidentiality   of this discharge information lies with you and/or your care-partner.     

## 2011-09-17 NOTE — Op Note (Signed)
[facility] [address] [place], [place]  [address]  COLONOSCOPY PROCEDURE REPORT  PATIENT:  Evan Wright, Evan Wright  MR#:  [id number] BIRTHDATE:  1924-12-20, 87 yrs. old  GENDER:  male ENDOSCOPIST:  Barbette Hair. Arlyce Dice, MD REF. BY: PROCEDURE DATE:  09/17/2011 PROCEDURE:  Colonoscopy with snare polypectomy, Colon with cold biopsy polypectomy ASA CLASS:  Class II INDICATIONS:  Screening, history of pre-cancerous (adenomatous) colon polyps, family history of colon cancer 2009 - multiple polyps Brother with colon Ca MEDICATIONS:   MAC sedation, administered by CRNA propofol 200mg IV  DESCRIPTION OF PROCEDURE:   After the risks benefits and alternatives of the procedure were thoroughly explained, informed consent was obtained.  Digital rectal exam was performed and revealed external hemorrhoids.   The LB 180AL [id number] endoscope was introduced through the anus and advanced to the cecum, which was identified by both the appendix and ileocecal valve, without limitations.  The quality of the prep was excellent, using MoviPrep.  The instrument was then slowly withdrawn as the colon was fully examined. <<PROCEDUREIMAGES>>  FINDINGS:  There were multiple polyps identified and removed. 10 polyps ranging from 2-61mm in cecum, ascending colon and hepatic flexure. All but one polyp was removed with cold polypectomy snare and submitted to pathology. The smallest polyp was removed with cold bx forceps (see image4, image6, image7, and image8).  A sessile polyp was found in the descending colon. It was 6 mm in size. Polyp was snared without cautery. Retrieval was successful (see image15). snare polyp  A sessile polyp was found in the sigmoid colon. It was 10 mm in size. It was found 20 cm from the point of entry. Polyp was snared without cautery. Retrieval was successful (see image17). snare polyp  Scattered diverticula were found in the sigmoid to descending colon segments.  This was otherwise a  normal examination of the colon (see image2, image3, and image18).   Retroflexed views in the rectum revealed no abnormalities.    The time to cecum =  1) 4.0  minutes. The scope was then withdrawn in  1) 22.75  minutes from the cecum and the procedure completed. COMPLICATIONS:  None ENDOSCOPIC IMPRESSION: 1) Polyps, multiple 2) 6 mm sessile polyp in the descending colon 3) 10 mm sessile polyp in the sigmoid colon 4) Diverticula, scattered in the sigmoid to descending colon segments 5) Otherwise normal examination RECOMMENDATIONS: 1) Await biopsy results REPEAT EXAM:  You will receive a letter from Dr. Arlyce Dice in 1-2 weeks, after reviewing the final pathology, with followup recommendations.  ______________________________ Barbette Hair Arlyce Dice, MD  CC:  Lelon Perla, DO  n. eSIGNED:   Barbette Hair. Dayleen Beske at 09/17/2011 10:28 AM  Burna Cash, [phone]

## 2011-09-17 NOTE — Progress Notes (Signed)
Patient did not experience any of the following events: a burn prior to discharge; a fall within the facility; wrong site/side/patient/procedure/implant event; or a hospital transfer or hospital admission upon discharge from the facility. (G8907) Patient did not have preoperative order for IV antibiotic SSI prophylaxis. (G8918)  

## 2011-09-17 NOTE — Progress Notes (Signed)
Propofol per [facility]. See scanned intra procedure report. ewm 

## 2011-09-18 ENCOUNTER — Telehealth: Payer: Self-pay | Admitting: *Deleted

## 2011-09-18 NOTE — Telephone Encounter (Signed)
  Follow up Call-  Call back number 09/17/2011  Post procedure Call Back phone  # [phone]  Permission to leave phone message Yes     Patient questions:  Message left to call us if necessary.

## 2011-09-24 ENCOUNTER — Encounter: Payer: Self-pay | Admitting: Gastroenterology

## 2011-10-23 ENCOUNTER — Telehealth: Payer: Self-pay | Admitting: Family Medicine

## 2011-10-23 MED ORDER — SIMVASTATIN 20 MG PO TABS: 20.0000 mg | ORAL_TABLET | Freq: Every day | ORAL | Status: DC

## 2011-10-23 NOTE — Telephone Encounter (Signed)
Refill: Simvastatin 20 mg tab. Take 1 tablet by mouth at bedtime. Qty 30. Last fill 09-27-11

## 2012-05-18 ENCOUNTER — Ambulatory Visit (INDEPENDENT_AMBULATORY_CARE_PROVIDER_SITE_OTHER): Payer: Medicare Other | Admitting: Family Medicine

## 2012-05-18 ENCOUNTER — Encounter: Payer: Self-pay | Admitting: Family Medicine

## 2012-05-18 VITALS — BP 132/70 | HR 62 | Temp 98.2°F | Ht 69.5 in | Wt 172.8 lb

## 2012-05-18 DIAGNOSIS — Z8546 Personal history of malignant neoplasm of prostate: Secondary | ICD-10-CM

## 2012-05-18 DIAGNOSIS — M1990 Unspecified osteoarthritis, unspecified site: Secondary | ICD-10-CM

## 2012-05-18 DIAGNOSIS — Z125 Encounter for screening for malignant neoplasm of prostate: Secondary | ICD-10-CM

## 2012-05-18 DIAGNOSIS — M654 Radial styloid tenosynovitis [de Quervain]: Secondary | ICD-10-CM

## 2012-05-18 DIAGNOSIS — Z23 Encounter for immunization: Secondary | ICD-10-CM

## 2012-05-18 DIAGNOSIS — E785 Hyperlipidemia, unspecified: Secondary | ICD-10-CM

## 2012-05-18 DIAGNOSIS — Z0000 Encounter for general adult medical examination without abnormal findings: Secondary | ICD-10-CM

## 2012-05-18 HISTORY — DX: Radial styloid tenosynovitis (de quervain): M65.4

## 2012-05-18 LAB — CBC WITH DIFFERENTIAL/PLATELET
Basophils Absolute: 0 10*3/uL (ref 0.0–0.1)
Basophils Relative: 0.3 % (ref 0.0–3.0)
Eosinophils Absolute: 0.1 10*3/uL (ref 0.0–0.7)
Hemoglobin: 15.6 g/dL (ref 13.0–17.0)
Lymphocytes Relative: 19.4 % (ref 12.0–46.0)
Lymphs Abs: 1.4 10*3/uL (ref 0.7–4.0)
MCHC: 33.1 g/dL (ref 30.0–36.0)
MCV: 91.9 fl (ref 78.0–100.0)
Monocytes Absolute: 0.5 10*3/uL (ref 0.1–1.0)
Neutro Abs: 5.3 10*3/uL (ref 1.4–7.7)
RBC: 5.14 Mil/uL (ref 4.22–5.81)
RDW: 13.2 % (ref 11.5–14.6)

## 2012-05-18 LAB — POCT URINALYSIS DIPSTICK
Bilirubin, UA: NEGATIVE
Blood, UA: NEGATIVE
Ketones, UA: NEGATIVE
Leukocytes, UA: NEGATIVE
Spec Grav, UA: 1.02
pH, UA: 6.5

## 2012-05-18 LAB — LIPID PANEL
Cholesterol: 154 mg/dL (ref 0–200)
HDL: 43.7 mg/dL (ref >39.00)
Triglycerides: 113 mg/dL (ref 0.0–149.0)
VLDL: 22.6 mg/dL (ref 0.0–40.0)

## 2012-05-18 LAB — HEPATIC FUNCTION PANEL
Albumin: 3.9 g/dL (ref 3.5–5.2)
Total Protein: 6.8 g/dL (ref 6.0–8.3)

## 2012-05-18 LAB — BASIC METABOLIC PANEL
CO2: 23 mEq/L (ref 19–32)
Calcium: 9.6 mg/dL (ref 8.4–10.5)
Chloride: 108 mEq/L (ref 96–112)
Glucose, Bld: 113 mg/dL — ABNORMAL HIGH (ref 70–99)
Sodium: 139 mEq/L (ref 135–145)

## 2012-05-18 MED ORDER — CELECOXIB 200 MG PO CAPS: ORAL_CAPSULE | ORAL | Status: DC

## 2012-05-18 MED ORDER — ZOSTER VACCINE LIVE 19400 UNT/0.65ML ~~LOC~~ SOLR: 0.6500 mL | Freq: Once | SUBCUTANEOUS | Status: DC

## 2012-05-18 NOTE — Patient Instructions (Addendum)
Preventive Care for Adults, Male A healthy lifestyle and preventive care can promote health and wellness. Preventive health guidelines for men include the following key practices:  A routine yearly physical is a good way to check with your caregiver about your health and preventative screening. It is a chance to share any concerns and updates on your health, and to receive a thorough exam.  Visit your dentist for a routine exam and preventative care every 6 months. Brush your teeth twice a day and floss once a day. Good oral hygiene prevents tooth decay and gum disease.  The frequency of eye exams is based on your age, health, family medical history, use of contact lenses, and other factors. Follow your caregiver's recommendations for frequency of eye exams.  Eat a healthy diet. Foods like vegetables, fruits, whole grains, low-fat dairy products, and lean protein foods contain the nutrients you need without too many calories. Decrease your intake of foods high in solid fats, added sugars, and salt. Eat the right amount of calories for you.Get information about a proper diet from your caregiver, if necessary.  Regular physical exercise is one of the most important things you can do for your health. Most adults should get at least 150 minutes of moderate-intensity exercise (any activity that increases your heart rate and causes you to sweat) each week. In addition, most adults need muscle-strengthening exercises on 2 or more days a week.  Maintain a healthy weight. The body mass index (BMI) is a screening tool to identify possible weight problems. It provides an estimate of body fat based on height and weight. Your caregiver can help determine your BMI, and can help you achieve or maintain a healthy weight.For adults 20 years and older:  A BMI below 18.5 is considered underweight.  A BMI of 18.5 to 24.9 is normal.  A BMI of 25 to 29.9 is considered overweight.  A BMI of 30 and above is  considered obese.  Maintain normal blood lipids and cholesterol levels by exercising and minimizing your intake of saturated fat. Eat a balanced diet with plenty of fruit and vegetables. Blood tests for lipids and cholesterol should begin at age 20 and be repeated every 5 years. If your lipid or cholesterol levels are high, you are over 50, or you are a high risk for heart disease, you may need your cholesterol levels checked more frequently.Ongoing high lipid and cholesterol levels should be treated with medicines if diet and exercise are not effective.  If you smoke, find out from your caregiver how to quit. If you do not use tobacco, do not start.  If you choose to drink alcohol, do not exceed 2 drinks per day. One drink is considered to be 12 ounces (355 mL) of beer, 5 ounces (148 mL) of wine, or 1.5 ounces (44 mL) of liquor.  Avoid use of street drugs. Do not share needles with anyone. Ask for help if you need support or instructions about stopping the use of drugs.  High blood pressure causes heart disease and increases the risk of stroke. Your blood pressure should be checked at least every 1 to 2 years. Ongoing high blood pressure should be treated with medicines, if weight loss and exercise are not effective.  If you are 45 to 76 years old, ask your caregiver if you should take aspirin to prevent heart disease.  Diabetes screening involves taking a blood sample to check your fasting blood sugar level. This should be done once every 3 years,   after age 45, if you are within normal weight and without risk factors for diabetes. Testing should be considered at a younger age or be carried out more frequently if you are overweight and have at least 1 risk factor for diabetes.  Colorectal cancer can be detected and often prevented. Most routine colorectal cancer screening begins at the age of 50 and continues through age 75. However, your caregiver may recommend screening at an earlier age if you  have risk factors for colon cancer. On a yearly basis, your caregiver may provide home test kits to check for hidden blood in the stool. Use of a small camera at the end of a tube, to directly examine the colon (sigmoidoscopy or colonoscopy), can detect the earliest forms of colorectal cancer. Talk to your caregiver about this at age 50, when routine screening begins. Direct examination of the colon should be repeated every 5 to 10 years through age 75, unless early forms of pre-cancerous polyps or small growths are found.  Hepatitis C blood testing is recommended for all people born from 1945 through 1965 and any individual with known risks for hepatitis C.  Practice safe sex. Use condoms and avoid high-risk sexual practices to reduce the spread of sexually transmitted infections (STIs). STIs include gonorrhea, chlamydia, syphilis, trichomonas, herpes, HPV, and human immunodeficiency virus (HIV). Herpes, HIV, and HPV are viral illnesses that have no cure. They can result in disability, cancer, and death.  A one-time screening for abdominal aortic aneurysm (AAA) and surgical repair of large AAAs by sound wave imaging (ultrasonography) is recommended for ages 65 to 75 years who are current or former smokers.  Healthy men should no longer receive prostate-specific antigen (PSA) blood tests as part of routine cancer screening. Consult with your caregiver about prostate cancer screening.  Testicular cancer screening is not recommended for adult males who have no symptoms. Screening includes self-exam, caregiver exam, and other screening tests. Consult with your caregiver about any symptoms you have or any concerns you have about testicular cancer.  Use sunscreen with skin protection factor (SPF) of 30 or more. Apply sunscreen liberally and repeatedly throughout the day. You should seek shade when your shadow is shorter than you. Protect yourself by wearing long sleeves, pants, a wide-brimmed hat, and  sunglasses year round, whenever you are outdoors.  Once a month, do a whole body skin exam, using a mirror to look at the skin on your back. Notify your caregiver of new moles, moles that have irregular borders, moles that are larger than a pencil eraser, or moles that have changed in shape or color.  Stay current with required immunizations.  Influenza. You need a dose every fall (or winter). The composition of the flu vaccine changes each year, so being vaccinated once is not enough.  Pneumococcal polysaccharide. You need 1 to 2 doses if you smoke cigarettes or if you have certain chronic medical conditions. You need 1 dose at age 65 (or older) if you have never been vaccinated.  Tetanus, diphtheria, pertussis (Tdap, Td). Get 1 dose of Tdap vaccine if you are younger than age 65 years, are over 65 and have contact with an infant, are a healthcare worker, or simply want to be protected from whooping cough. After that, you need a Td booster dose every 10 years. Consult your caregiver if you have not had at least 3 tetanus and diphtheria-containing shots sometime in your life or have a deep or dirty wound.  HPV. This vaccine is recommended   for males 13 through 76 years of age. This vaccine may be given to men 22 through 76 years of age who have not completed the 3 dose series. It is recommended for men through age 26 who have sex with men or whose immune system is weakened because of HIV infection, other illness, or medications. The vaccine is given in 3 doses over 6 months.  Measles, mumps, rubella (MMR). You need at least 1 dose of MMR if you were born in 1957 or later. You may also need a 2nd dose.  Meningococcal. If you are age 19 to 21 years and a first-year college student living in a residence hall, or have one of several medical conditions, you need to get vaccinated against meningococcal disease. You may also need additional booster doses.  Zoster (shingles). If you are age 60 years or  older, you should get this vaccine.  Varicella (chickenpox). If you have never had chickenpox or you were vaccinated but received only 1 dose, talk to your caregiver to find out if you need this vaccine.  Hepatitis A. You need this vaccine if you have a specific risk factor for hepatitis A virus infection, or you simply wish to be protected from this disease. The vaccine is usually given as 2 doses, 6 to 18 months apart.  Hepatitis B. You need this vaccine if you have a specific risk factor for hepatitis B virus infection or you simply wish to be protected from this disease. The vaccine is given in 3 doses, usually over 6 months. Preventative Service / Frequency Ages 19 to 39  Blood pressure check.** / Every 1 to 2 years.  Lipid and cholesterol check.** / Every 5 years beginning at age 20.  Hepatitis C blood test.** / For any individual with known risks for hepatitis C.  Skin self-exam. / Monthly.  Influenza immunization.** / Every year.  Pneumococcal polysaccharide immunization.** / 1 to 2 doses if you smoke cigarettes or if you have certain chronic medical conditions.  Tetanus, diphtheria, pertussis (Tdap,Td) immunization. / A one-time dose of Tdap vaccine. After that, you need a Td booster dose every 10 years.  HPV immunization. / 3 doses over 6 months, if 26 and younger.  Measles, mumps, rubella (MMR) immunization. / You need at least 1 dose of MMR if you were born in 1957 or later. You may also need a 2nd dose.  Meningococcal immunization. / 1 dose if you are age 19 to 21 years and a first-year college student living in a residence hall, or have one of several medical conditions, you need to get vaccinated against meningococcal disease. You may also need additional booster doses.  Varicella immunization.** / Consult your caregiver.  Hepatitis A immunization.** / Consult your caregiver. 2 doses, 6 to 18 months apart.  Hepatitis B immunization.** / Consult your caregiver. 3 doses  usually over 6 months. Ages 40 to 64  Blood pressure check.** / Every 1 to 2 years.  Lipid and cholesterol check.** / Every 5 years beginning at age 20.  Fecal occult blood test (FOBT) of stool. / Every year beginning at age 50 and continuing until age 75. You may not have to do this test if you get colonoscopy every 10 years.  Flexible sigmoidoscopy** or colonoscopy.** / Every 5 years for a flexible sigmoidoscopy or every 10 years for a colonoscopy beginning at age 50 and continuing until age 75.  Hepatitis C blood test.** / For all people born from 1945 through 1965 and any   individual with known risks for hepatitis C.  Skin self-exam. / Monthly.  Influenza immunization.** / Every year.  Pneumococcal polysaccharide immunization.** / 1 to 2 doses if you smoke cigarettes or if you have certain chronic medical conditions.  Tetanus, diphtheria, pertussis (Tdap/Td) immunization.** / A one-time dose of Tdap vaccine. After that, you need a Td booster dose every 10 years.  Measles, mumps, rubella (MMR) immunization. / You need at least 1 dose of MMR if you were born in 1957 or later. You may also need a 2nd dose.  Varicella immunization.**/ Consult your caregiver.  Meningococcal immunization.** / Consult your caregiver.  Hepatitis A immunization.** / Consult your caregiver. 2 doses, 6 to 18 months apart.  Hepatitis B immunization.** / Consult your caregiver. 3 doses, usually over 6 months. Ages 65 and over  Blood pressure check.** / Every 1 to 2 years.  Lipid and cholesterol check.**/ Every 5 years beginning at age 20.  Fecal occult blood test (FOBT) of stool. / Every year beginning at age 50 and continuing until age 75. You may not have to do this test if you get colonoscopy every 10 years.  Flexible sigmoidoscopy** or colonoscopy.** / Every 5 years for a flexible sigmoidoscopy or every 10 years for a colonoscopy beginning at age 50 and continuing until age 75.  Hepatitis C blood  test.** / For all people born from 1945 through 1965 and any individual with known risks for hepatitis C.  Abdominal aortic aneurysm (AAA) screening.** / A one-time screening for ages 65 to 75 years who are current or former smokers.  Skin self-exam. / Monthly.  Influenza immunization.** / Every year.  Pneumococcal polysaccharide immunization.** / 1 dose at age 65 (or older) if you have never been vaccinated.  Tetanus, diphtheria, pertussis (Tdap, Td) immunization. / A one-time dose of Tdap vaccine if you are over 65 and have contact with an infant, are a healthcare worker, or simply want to be protected from whooping cough. After that, you need a Td booster dose every 10 years.  Varicella immunization. ** / Consult your caregiver.  Meningococcal immunization.** / Consult your caregiver.  Hepatitis A immunization. ** / Consult your caregiver. 2 doses, 6 to 18 months apart.  Hepatitis B immunization.** / Check with your caregiver. 3 doses, usually over 6 months. **Family history and personal history of risk and conditions may change your caregiver's recommendations. Document Released: 09/03/2001 Document Revised: 09/30/2011 Document Reviewed: 12/03/2010 [employer] Patient Information 2013 [employer].  

## 2012-05-18 NOTE — Assessment & Plan Note (Signed)
Sample of celebrex given

## 2012-05-18 NOTE — Assessment & Plan Note (Signed)
con't meds  Check labs 

## 2012-05-18 NOTE — Assessment & Plan Note (Signed)
Brace from pharmacy F/u ortho celebrex

## 2012-05-18 NOTE — Progress Notes (Signed)
Subjective:    Evan Wright is a 78 y.o. male who presents for Medicare Annual/Subsequent preventive examination.   Preventive Screening-Counseling & Management  Tobacco History  Smoking status  . Never Smoker   Smokeless tobacco  . Never Used    Problems Prior to Visit 1. osteoarthritis  Current Problems (verified) Patient Active Problem List  Diagnosis  . UNSPECIFIED VITAMIN D DEFICIENCY  . HIATAL HERNIA WITH REFLUX  . GASTROENTERITIS  . DIZZINESS  . CARDIAC MURMUR  . ELEVATED BLOOD PRESSURE WITHOUT DIAGNOSIS OF HYPERTENSION  . PROSTATE CANCER, HX OF  . PERSONAL HISTORY OF COLONIC POLYPS  . NEVI, MULTIPLE  . OSTEOARTHRITIS  . Family history of malignant neoplasm of gastrointestinal tract    Medications Prior to Visit Current Outpatient Prescriptions on File Prior to Visit  Medication Sig Dispense Refill  . aspirin 81 MG chewable tablet Chew 81 mg by mouth daily.        . Calcium Carbonate (CALTRATE 600) 1500 MG TABS Take by mouth daily.        . Glucosamine-Chondroitin (OSTEO BI-FLEX REGULAR STRENGTH) 250-200 MG TABS Take by mouth daily.        . simvastatin (ZOCOR) 20 MG tablet Take 1 tablet (20 mg total) by mouth at bedtime.  30 tablet  5    Current Medications (verified) Current Outpatient Prescriptions  Medication Sig Dispense Refill  . AMBULATORY NON FORMULARY MEDICATION Total Body Daily Defense (coq10,D3,Resevertrol,Greentea,ginkgo,lutein,omega 3 oils)  1 by mouth daily      . aspirin 81 MG chewable tablet Chew 81 mg by mouth daily.        . Calcium Carbonate (CALTRATE 600) 1500 MG TABS Take by mouth daily.        . Glucosamine-Chondroitin (OSTEO BI-FLEX REGULAR STRENGTH) 250-200 MG TABS Take by mouth daily.        . simvastatin (ZOCOR) 20 MG tablet Take 1 tablet (20 mg total) by mouth at bedtime.  30 tablet  5  . zoster vaccine live, PF, (ZOSTAVAX) 96045 UNT/0.65ML injection Inject 19,400 Units into the skin once.  1 vial  0     Allergies  (verified) Penicillins   PAST HISTORY  Family History Family History  Problem Relation Age of Onset  . Lung cancer    . Prostate cancer    . Colon cancer Brother     Social History History  Substance Use Topics  . Smoking status: Never Smoker   . Smokeless tobacco: Never Used  . Alcohol Use: No    Are there smokers in your home (other than you)?  No  Risk Factors Current exercise habits: Home exercise routine includes yard work .  Dietary issues discussed: na   Cardiac risk factors: advanced age (older than 75 for men, 73 for women), dyslipidemia and male gender.  Depression Screen (Note: if answer to either of the following is "Yes", a more complete depression screening is indicated)   Q1: Over the past two weeks, have you felt down, depressed or hopeless? No  Q2: Over the past two weeks, have you felt little interest or pleasure in doing things? No  Have you lost interest or pleasure in daily life? No  Do you often feel hopeless? No  Do you cry easily over simple problems? No  Activities of Daily Living In your present state of health, do you have any difficulty performing the following activities?:  Driving? No Managing money?  No Feeding yourself? No Getting from bed to chair? No Climbing a  flight of stairs? No Preparing food and eating?: No Bathing or showering? No Getting dressed: No Getting to the toilet? No Using the toilet:No Moving around from place to place: No In the past year have you fallen or had a near fall?:No   Are you sexually active?  Yes  Do you have more than one partner?  No  Hearing Difficulties: No Do you often ask people to speak up or repeat themselves? No Do you experience ringing or noises in your ears? No Do you have difficulty understanding soft or whispered voices? No   Do you feel that you have a problem with memory? No  Do you often misplace items? No  Do you feel safe at home?  Yes  Cognitive Testing  Alert? Yes   Normal Appearance?Yes  Oriented to person? Yes  Place? Yes   Time? Yes  Recall of three objects?  Yes  Can perform simple calculations? Yes  Displays appropriate judgment?Yes  Can read the correct time from a watch face?Yes   Advanced Directives have been discussed with the patient? No   List the Names of Other Physician/Practitioners you currently use: 1.  Urology-[facility] 2.  optho-- [place] 3 Dentist-  [facility] 4  Ortho-- [place] [facility] 5  GI-- [facility] any recent Medical Services you may have received from other than [facility] providers in the past year (date may be approximate).  Immunization History  Administered Date(s) Administered  . Influenza Whole 05/09/2010  . Pneumococcal Polysaccharide 07/03/2005    Screening Tests Health Maintenance  Topic Date Due  . Tetanus/tdap  [birth year]/1945  . Zostavax  08/28/1986  . Colonoscopy  09/16/2012  . Influenza Vaccine  03/22/2013  . Pneumococcal Polysaccharide Vaccine Age 62 And Over  Completed    All answers were reviewed with the patient and necessary referrals were made:  Loreen Freud, DO   05/18/2012   History reviewed: allergies, current medications, past family history, past medical history, past social history, past surgical history and problem list  Review of Systems  Review of Systems  Constitutional: Negative for activity change, appetite change and fatigue.  HENT: Negative for hearing loss, congestion, tinnitus and ear discharge. Dentist-- q47m  Eyes: Negative for visual disturbance (see optho q1y -- vision corrected to 20/20 with glasses).  Respiratory: Negative for cough, chest tightness and shortness of breath.   Cardiovascular: Negative for chest pain, palpitations and leg swelling.  Gastrointestinal: Negative for abdominal pain, diarrhea, constipation and abdominal distention.  Genitourinary: Negative for urgency, frequency, decreased urine volume and difficulty urinating.  Musculoskeletal: Negative for back pain,  arthralgias and gait problem.  Skin: Negative for color change, pallor and rash.  Neurological: Negative for dizziness, light-headedness, numbness and headaches.  Hematological: Negative for adenopathy. Does not bruise/bleed easily.  Psychiatric/Behavioral: Negative for suicidal ideas, confusion, sleep disturbance, self-injury, dysphoric mood, decreased concentration and agitation.  Pt is able to read and write and can do all ADLs No risk for falling No abuse/ violence in home     Objective:     Vision by Snellen chart: opth Blood pressure 132/70, pulse 62, temperature 98.2 F (36.8 C), temperature source Oral, height 5' 9.5" (1.765 m), weight 172 lb 12.8 oz (78.382 kg), SpO2 99.00%. Body mass index is 25.15 kg/(m^2).  BP 132/70  Pulse 62  Temp 98.2 F (36.8 C) (Oral)  Ht 5' 9.5" (1.765 m)  Wt 172 lb 12.8 oz (78.382 kg)  BMI 25.15 kg/m2  SpO2 99% General appearance: alert, cooperative, appears stated age  and no distress Head: Normocephalic, without obvious abnormality, atraumatic Eyes: negative findings: lids and lashes normal, conjunctivae and sclerae normal and pupils equal, round, reactive to light and accomodation Ears: normal TM's and external ear canals both ears Nose: Nares normal. Septum midline. Mucosa normal. No drainage or sinus tenderness. Throat: lips, mucosa, and tongue normal; teeth and gums normal Neck: no adenopathy, no carotid bruit, no JVD, supple, symmetrical, trachea midline and thyroid not enlarged, symmetric, no tenderness/mass/nodules Back: symmetric, no curvature. ROM normal. No CVA tenderness. Lungs: clear to auscultation bilaterally Chest wall: no tenderness Heart: regular rate and rhythm, S1, S2 normal, no murmur, click, rub or gallop Abdomen: soft, non-tender; bowel sounds normal; no masses,  no organomegaly Male genitalia: normal, deferred-- per urology Rectal: deferred--urology Extremities: extremities normal, atraumatic, no cyanosis or  edema Pulses: 2+ and symmetric Skin: Skin color, texture, turgor normal. No rashes or lesions Lymph nodes: Cervical, supraclavicular, and axillary nodes normal. Neurologic: Alert and oriented X 3, normal strength and tone. Normal symmetric reflexes. Normal coordination and gait Psych-- no depression, no anxiety    Assessment:     cpe     Plan:     During the course of the visit the patient was educated and counseled about appropriate screening and preventive services including:    Influenza vaccine  Prostate cancer screening  Colorectal cancer screening  Glaucoma screening  Nutrition counseling   Advanced directives: has NO advanced directive - not interested in additional information  Diet review for nutrition referral? Yes ____  Not Indicated __x__   Patient Instructions (the written plan) was given to the patient.  Medicare Attestation I have personally reviewed: The patient's medical and social history Their use of alcohol, tobacco or illicit drugs Their current medications and supplements The patient's functional ability including ADLs,fall risks, home safety risks, cognitive, and hearing and visual impairment Diet and physical activities Evidence for depression or mood disorders  The patient's weight, height, BMI, and visual acuity have been recorded in the chart.  I have made referrals, counseling, and provided education to the patient based on review of the above and I have provided the patient with a written personalized care plan for preventive services.     Loreen Freud, DO   05/18/2012

## 2012-05-18 NOTE — Assessment & Plan Note (Signed)
Per u rology 

## 2012-06-10 ENCOUNTER — Encounter: Payer: Medicare Other | Admitting: Family Medicine

## 2012-06-23 ENCOUNTER — Encounter: Payer: Medicare Other | Admitting: Family Medicine

## 2012-08-11 ENCOUNTER — Encounter: Payer: Medicare Other | Admitting: Family Medicine

## 2012-08-26 ENCOUNTER — Encounter: Payer: Self-pay | Admitting: Gastroenterology

## 2012-09-24 ENCOUNTER — Telehealth: Payer: Self-pay | Admitting: Family Medicine

## 2012-09-24 MED ORDER — SIMVASTATIN 20 MG PO TABS: 20.0000 mg | ORAL_TABLET | Freq: Every day | ORAL | Status: DC

## 2012-09-24 NOTE — Telephone Encounter (Signed)
refill Simvastatin (Tab) 20 MG Take 1 tablet (20 mg total) by mouth at bedtime. #30 LAST FILL IS NOT LISTED

## 2013-02-16 ENCOUNTER — Encounter: Payer: Self-pay | Admitting: Gastroenterology

## 2013-03-31 ENCOUNTER — Ambulatory Visit (INDEPENDENT_AMBULATORY_CARE_PROVIDER_SITE_OTHER): Payer: Medicare Other | Admitting: Gastroenterology

## 2013-03-31 ENCOUNTER — Encounter: Payer: Self-pay | Admitting: Gastroenterology

## 2013-03-31 VITALS — BP 170/80 | HR 76 | Ht 69.5 in | Wt 177.4 lb

## 2013-03-31 DIAGNOSIS — Z860100 Personal history of colon polyps, unspecified: Secondary | ICD-10-CM

## 2013-03-31 NOTE — Progress Notes (Signed)
History of Present Illness:  79 year old male here for recall colonoscopy.  Polyps were removed in 2009 and again in 2013.  At last colonoscopy 12 adenomatous polyps were removed ranging from 2-10 mm.  Except for mild constipation he has no complaints.    Review of Systems: Pertinent positive and negative review of systems were noted in the above HPI section. All other review of systems were otherwise negative.    Current Medications, Allergies, Past Medical History, Past Surgical History, Family History and Social History were reviewed in [facility] electronic medical record  Vital signs were reviewed in today's medical record. Physical Exam: General: Well developed , well nourished, no acute distress Skin: anicteric Head: Normocephalic and atraumatic Eyes:  sclerae anicteric, EOMI Ears: Normal auditory acuity Mouth: No deformity or lesions Lungs: Clear throughout to auscultation Heart: Regular rate and rhythm; no murmurs, rubs or bruits Abdomen: Soft, non tender and non distended. No masses, hepatosplenomegaly or hernias noted. Normal Bowel sounds Rectal:deferred Musculoskeletal: Symmetrical with no gross deformities  Pulses:  Normal pulses noted Extremities: No clubbing, cyanosis, edema or deformities noted Neurological: Alert oriented x 4, grossly nonfocal Psychological:  Alert and cooperative. Normal mood and affect

## 2013-03-31 NOTE — Patient Instructions (Addendum)
Follow up as needed

## 2013-03-31 NOTE — Assessment & Plan Note (Addendum)
Patient has had recurrent polyposis including 12 polyps that were removed in 2013.  Fortunately, there was no concerning pathology and the largest polyp was 10 mm.  Weighing risks versus benefits for routine colonoscopy I favor expectant management only.  I explained this to the patient who is agreeable to this.  Accordingly, no further routine examinations will be scheduled.

## 2013-04-02 ENCOUNTER — Telehealth: Payer: Self-pay | Admitting: Gastroenterology

## 2013-04-02 NOTE — Telephone Encounter (Signed)
Evan Wright the pt's daughter is calling wanting to speak to someone about her father being discriminated due to age. She states he was seen on Wednesday and was told he no longer needed to have a colon due to age. She says he had one last year and he was over 80, and she doesn't understand what the difference is. Her contact # is [phone].

## 2013-04-02 NOTE — Telephone Encounter (Signed)
Spoke with pts daughter and let her know that with new guidelines we do not usually do colons on pts over the age of 12. Discussed with her the risk vs benefit but she still was not pleased. Offered to schedule an appt for her and the pt to come in a speak with Dr. Arlyce Dice about her concerns. States she may call back to schedule an appt.

## 2013-04-13 ENCOUNTER — Telehealth: Payer: Self-pay | Admitting: Family Medicine

## 2013-04-13 MED ORDER — SIMVASTATIN 20 MG PO TABS: ORAL_TABLET | ORAL | Status: DC

## 2013-04-13 NOTE — Telephone Encounter (Signed)
Refill request for:   Simvastatin (Tab) 20 MG  Qty: 30 Directions: Take 1 tablet (20 mg total) by mouth at bedtime.  Last filled: 3.6.2014 Last OV: 10.28.2013

## 2013-07-26 ENCOUNTER — Other Ambulatory Visit: Payer: Self-pay | Admitting: *Deleted

## 2013-07-26 DIAGNOSIS — E785 Hyperlipidemia, unspecified: Secondary | ICD-10-CM

## 2013-07-26 MED ORDER — SIMVASTATIN 20 MG PO TABS: ORAL_TABLET | ORAL | Status: DC

## 2013-07-26 NOTE — Telephone Encounter (Signed)
Spoke with patient made aware that we could only refill zocor for 30 days. Any additional refills require OV and labs. He will call the office tomorrow to schedule appt.

## 2013-11-10 ENCOUNTER — Telehealth: Payer: Self-pay

## 2013-11-10 NOTE — Telephone Encounter (Signed)
Medication List and allergies:  Updated and Reviewed  90 day supply/mail order: n/a Local prescriptions:  [facility], [place] - [id number]  Immunization due:  Td- due; Zoster- due  A/P: No changes to personal, family or [place] Flu- 05/2013 Tdap- received last one more than 10 years ago PNA- 07/03/05 Shingles- never received one CCS- 09/17/11- polyps and diverticula PSA- 05/18/12- 0.01   To discuss with provider: Nothing at this time.

## 2013-11-10 NOTE — Telephone Encounter (Signed)
Left message with wife, Jazen Spraggins, for patient to call back.

## 2013-11-11 ENCOUNTER — Ambulatory Visit (INDEPENDENT_AMBULATORY_CARE_PROVIDER_SITE_OTHER): Payer: Medicare Other | Admitting: Family Medicine

## 2013-11-11 ENCOUNTER — Encounter: Payer: Self-pay | Admitting: Family Medicine

## 2013-11-11 VITALS — BP 120/70 | HR 60 | Temp 98.5°F | Ht 69.5 in | Wt 180.0 lb

## 2013-11-11 DIAGNOSIS — Z8546 Personal history of malignant neoplasm of prostate: Secondary | ICD-10-CM

## 2013-11-11 DIAGNOSIS — E785 Hyperlipidemia, unspecified: Secondary | ICD-10-CM

## 2013-11-11 DIAGNOSIS — Z23 Encounter for immunization: Secondary | ICD-10-CM

## 2013-11-11 DIAGNOSIS — Z0000 Encounter for general adult medical examination without abnormal findings: Secondary | ICD-10-CM

## 2013-11-11 LAB — CBC WITH DIFFERENTIAL/PLATELET
BASOS ABS: 0 10*3/uL (ref 0.0–0.1)
Basophils Relative: 0.3 % (ref 0.0–3.0)
EOS ABS: 0.1 10*3/uL (ref 0.0–0.7)
EOS PCT: 1.3 % (ref 0.0–5.0)
HEMATOCRIT: 48.1 % (ref 39.0–52.0)
Hemoglobin: 16.3 g/dL (ref 13.0–17.0)
LYMPHS ABS: 1.9 10*3/uL (ref 0.7–4.0)
Lymphocytes Relative: 28.3 % (ref 12.0–46.0)
MCHC: 33.9 g/dL (ref 30.0–36.0)
MCV: 92.1 fl (ref 78.0–100.0)
MONO ABS: 0.5 10*3/uL (ref 0.1–1.0)
Monocytes Relative: 7.8 % (ref 3.0–12.0)
Neutro Abs: 4.3 10*3/uL (ref 1.4–7.7)
Neutrophils Relative %: 62.3 % (ref 43.0–77.0)
PLATELETS: 214 10*3/uL (ref 150.0–400.0)
RBC: 5.23 Mil/uL (ref 4.22–5.81)
RDW: 13 % (ref 11.5–14.6)
WBC: 6.9 10*3/uL (ref 4.5–10.5)

## 2013-11-11 LAB — LIPID PANEL
CHOLESTEROL: 172 mg/dL (ref 0–200)
HDL: 45.1 mg/dL (ref >39.00)
LDL CALC: 97 mg/dL (ref 0–99)
TRIGLYCERIDES: 149 mg/dL (ref 0.0–149.0)
Total CHOL/HDL Ratio: 4
VLDL: 29.8 mg/dL (ref 0.0–40.0)

## 2013-11-11 LAB — BASIC METABOLIC PANEL
BUN: 15 mg/dL (ref 6–23)
CHLORIDE: 106 meq/L (ref 96–112)
CO2: 23 meq/L (ref 19–32)
Calcium: 9.8 mg/dL (ref 8.4–10.5)
Creatinine, Ser: 0.9 mg/dL (ref 0.4–1.5)
GFR: 90.58 mL/min (ref >60.00)
GLUCOSE: 117 mg/dL — AB (ref 70–99)
POTASSIUM: 4.5 meq/L (ref 3.5–5.1)
Sodium: 140 mEq/L (ref 135–145)

## 2013-11-11 LAB — HEPATIC FUNCTION PANEL
ALT: 34 U/L (ref 0–53)
AST: 32 U/L (ref 0–37)
Albumin: 4.5 g/dL (ref 3.5–5.2)
Alkaline Phosphatase: 66 U/L (ref 39–117)
BILIRUBIN TOTAL: 1.1 mg/dL (ref 0.3–1.2)
Bilirubin, Direct: 0.2 mg/dL (ref 0.0–0.3)
Total Protein: 7.2 g/dL (ref 6.0–8.3)

## 2013-11-11 LAB — PSA: PSA: 0.01 ng/mL — ABNORMAL LOW (ref 0.10–4.00)

## 2013-11-11 MED ORDER — SIMVASTATIN 20 MG PO TABS: ORAL_TABLET | ORAL | Status: DC

## 2013-11-11 MED ORDER — TETANUS-DIPHTH-ACELL PERTUSSIS 5-2-15.5 LF-MCG/0.5 IM SUSP: 0.5000 mL | Freq: Once | INTRAMUSCULAR | Status: DC

## 2013-11-11 MED ORDER — ZOSTER VACCINE LIVE 19400 UNT/0.65ML ~~LOC~~ SOLR: 0.6500 mL | Freq: Once | SUBCUTANEOUS | Status: DC

## 2013-11-11 NOTE — Progress Notes (Signed)
Subjective:    Evan Wright is a 80 y.o. male who presents for Medicare Annual/Subsequent preventive examination.   Preventive Screening-Counseling & Management  Tobacco History  Smoking status  . Never Smoker   Smokeless tobacco  . Never Used    Problems Prior to Visit 1. none  Current Problems (verified) Patient Active Problem List   Diagnosis Date Noted  . Hyperlipidemia 05/18/2012  . De Quervain's tenosynovitis 05/18/2012  . Family history of malignant neoplasm of gastrointestinal tract 05/14/2011  . NEVI, MULTIPLE 07/12/2010  . OSTEOARTHRITIS 07/12/2010  . UNSPECIFIED VITAMIN D DEFICIENCY 02/11/2008  . HIATAL HERNIA WITH REFLUX 02/11/2008  . CARDIAC MURMUR 02/11/2008  . GASTROENTERITIS 01/20/2008  . PERSONAL HISTORY OF COLONIC POLYPS 11/17/2007  . DIZZINESS 10/15/2007  . ELEVATED BLOOD PRESSURE WITHOUT DIAGNOSIS OF HYPERTENSION 10/15/2007  . PROSTATE CANCER, HX OF 10/15/2007    Medications Prior to Visit Current Outpatient Prescriptions on File Prior to Visit  Medication Sig Dispense Refill  . aspirin 81 MG chewable tablet Chew 81 mg by mouth daily.        . Calcium Carbonate (CALTRATE 600) 1500 MG TABS Take by mouth daily.        . Psyllium (METAMUCIL PO) Take by mouth daily.       No current facility-administered medications on file prior to visit.    Current Medications (verified) Current Outpatient Prescriptions  Medication Sig Dispense Refill  . aspirin 81 MG chewable tablet Chew 81 mg by mouth daily.        . Calcium Carbonate (CALTRATE 600) 1500 MG TABS Take by mouth daily.        . Psyllium (METAMUCIL PO) Take by mouth daily.      . simvastatin (ZOCOR) 20 MG tablet 1 tab by mouth at bedtime--  30 tablet  5  . Tdap (ADACEL) 11-20-13.5 LF-MCG/0.5 injection Inject 0.5 mLs into the muscle once.  0.5 mL  0  . zoster vaccine live, PF, (ZOSTAVAX) 17616 UNT/0.65ML injection Inject 19,400 Units into the skin once.  1 each  0   No current  facility-administered medications for this visit.     Allergies (verified) Penicillins   PAST HISTORY  Family History Family History  Problem Relation Age of Onset  . Lung cancer Father   . Prostate cancer Father   . Colon cancer Brother   . Colitis Mother     Social History History  Substance Use Topics  . Smoking status: Never Smoker   . Smokeless tobacco: Never Used  . Alcohol Use: No    Are there smokers in your home (other than you)?  No  Risk Factors Current exercise habits: The patient does not participate in regular exercise at present.  Dietary issues discussed: na   Cardiac risk factors: advanced age (older than 15 for men, 38 for women), dyslipidemia, male gender and sedentary lifestyle.  Depression Screen (Note: if answer to either of the following is "Yes", a more complete depression screening is indicated)   Q1: Over the past two weeks, have you felt down, depressed or hopeless? No  Q2: Over the past two weeks, have you felt little interest or pleasure in doing things? No  Have you lost interest or pleasure in daily life? No  Do you often feel hopeless? No  Do you cry easily over simple problems? No  Activities of Daily Living In your present state of health, do you have any difficulty performing the following activities?:  Driving? No Managing money?  No  Feeding yourself? No Getting from bed to chair? No Climbing a flight of stairs? No Preparing food and eating?: No Bathing or showering? No Getting dressed: No Getting to the toilet? No Using the toilet:No Moving around from place to place: No In the past year have you fallen or had a near fall?:No   Are you sexually active?  Yes  Do you have more than one partner?  No  Hearing Difficulties: No Do you often ask people to speak up or repeat themselves? No Do you experience ringing or noises in your ears? No Do you have difficulty understanding soft or whispered voices? No   Do you feel that  you have a problem with memory? No  Do you often misplace items? No  Do you feel safe at home?  Yes  Cognitive Testing  Alert? Yes  Normal Appearance?Yes  Oriented to person? Yes  Place? Yes   Time? Yes  Recall of three objects?  Yes  Can perform simple calculations? Yes  Displays appropriate judgment?Yes  Can read the correct time from a watch face?Yes   Advanced Directives have been discussed with the patient? Yes   List the Names of Other Physician/Practitioners you currently use: 1.  opth-- [facility] 2. Dental --   Indicate any recent Medical Services you may have received from other than [facility] providers in the past year (date may be approximate).  Immunization History  Administered Date(s) Administered  . Influenza Split 05/18/2012  . Influenza Whole 05/09/2010  . Influenza-Unspecified 05/22/2013  . Pneumococcal Polysaccharide-23 07/03/2005    Screening Tests Health Maintenance  Topic Date Due  . Tetanus/tdap  [birth year]/1945  . Zostavax  08/28/1986  . Colonoscopy  09/16/2012  . Influenza Vaccine  02/19/2014  . Pneumococcal Polysaccharide Vaccine Age 39 And Over  Completed    All answers were reviewed with the patient and necessary referrals were made:  Garnet Koyanagi, DO   11/11/2013   History reviewed:  He  has a past medical history of Osteoarthritis; Hyperlipidemia; Diverticulosis; Prostate cancer; and Adenomatous colon polyp. He  does not have any pertinent problems on file. He  has past surgical history that includes Prostatectomy; Tonsillectomy; Eye surgery (2012); and Appendectomy (1948). His family history includes Colitis in his mother; Colon cancer in his brother; Lung cancer in his father; Prostate cancer in his father. He  reports that he has never smoked. He has never used smokeless tobacco. He reports that he does not drink alcohol or use illicit drugs. He has a current medication list which includes the following prescription(s): aspirin, calcium carbonate,  psyllium, simvastatin, tdap, and zoster vaccine live (pf). Current Outpatient Prescriptions on File Prior to Visit  Medication Sig Dispense Refill  . aspirin 81 MG chewable tablet Chew 81 mg by mouth daily.        . Calcium Carbonate (CALTRATE 600) 1500 MG TABS Take by mouth daily.        . Psyllium (METAMUCIL PO) Take by mouth daily.       No current facility-administered medications on file prior to visit.   He is allergic to penicillins.  Review of Systems  Review of Systems  Constitutional: Negative for activity change, appetite change and fatigue.  HENT: Negative for hearing loss, congestion, tinnitus and ear discharge.   Eyes: Negative for visual disturbance (see optho q1y -- vision corrected to 20/20 with glasses).  Respiratory: Negative for cough, chest tightness and shortness of breath.   Cardiovascular: Negative for chest pain, palpitations and leg swelling.  Gastrointestinal: Negative for abdominal pain, diarrhea, constipation and abdominal distention.  Genitourinary: Negative for urgency, frequency, decreased urine volume and difficulty urinating.  Musculoskeletal: Negative for back pain, arthralgias and gait problem.  Skin: Negative for color change, pallor and rash.  Neurological: Negative for dizziness, light-headedness, numbness and headaches.  Hematological: Negative for adenopathy. Does not bruise/bleed easily.  Psychiatric/Behavioral: Negative for suicidal ideas, confusion, sleep disturbance, self-injury, dysphoric mood, decreased concentration and agitation.  Pt is able to read and write and can do all ADLs No risk for falling No abuse/ violence in home      Objective:     Vision by Snellen chart: opth Blood pressure 120/70, pulse 60, temperature 98.5 F (36.9 C), temperature source Oral, height 5' 9.5" (1.765 m), weight 180 lb (81.647 kg), SpO2 99.00%. Body mass index is 26.21 kg/(m^2).  BP 120/70  Pulse 60  Temp(Src) 98.5 F (36.9 C) (Oral)  Ht 5'  9.5" (1.765 m)  Wt 180 lb (81.647 kg)  BMI 26.21 kg/m2  SpO2 99% General appearance: alert, cooperative, appears stated age and no distress Head: Normocephalic, without obvious abnormality, atraumatic Eyes: conjunctivae/corneas clear. PERRL, EOM's intact. Fundi benign. Ears: normal TM's and external ear canals both ears Nose: Nares normal. Septum midline. Mucosa normal. No drainage or sinus tenderness. Throat: lips, mucosa, and tongue normal; teeth and gums normal Neck: no adenopathy, no carotid bruit, no JVD, supple, symmetrical, trachea midline and thyroid not enlarged, symmetric, no tenderness/mass/nodules Back: symmetric, no curvature. ROM normal. No CVA tenderness. Lungs: clear to auscultation bilaterally Chest wall: no tenderness Heart: S1, S2 normal Abdomen: soft, non-tender; bowel sounds normal; no masses,  no organomegaly Male genitalia: deferred---urology Rectal: deferred--urology Extremities: extremities normal, atraumatic, no cyanosis or edema Pulses: 2+ and symmetric Skin: Skin color, texture, turgor normal. No rashes or lesions Lymph nodes: Cervical, supraclavicular, and axillary nodes normal. Neurologic: Alert and oriented X 3, normal strength and tone. Normal symmetric reflexes. Normal coordination and gait Psych- no depression, no anxiety      Assessment:     cpe     Plan:     During the course of the visit the patient was educated and counseled about appropriate screening and preventive services including:    Pneumococcal vaccine   Influenza vaccine  Td vaccine  Screening electrocardiogram  Prostate cancer screening  Glaucoma screening  Advanced directives: has NO advanced directive  - add't info requested. Referral to SW: no       Pt was told by GI he no longer needs colonoscopy due to age Diet review for nutrition referral? Yes ____  Not Indicated __x_   Patient Instructions (the written plan) was given to the patient.  Medicare  Attestation I have personally reviewed: The patient's medical and social history Their use of alcohol, tobacco or illicit drugs Their current medications and supplements The patient's functional ability including ADLs,fall risks, home safety risks, cognitive, and hearing and visual impairment Diet and physical activities Evidence for depression or mood disorders  The patient's weight, height, BMI, and visual acuity have been recorded in the chart.  I have made referrals, counseling, and provided education to the patient based on review of the above and I have provided the patient with a written personalized care plan for preventive services.    1. Hyperlipidemia LDL goal < 100 Check labs, con't meds - Basic metabolic panel - CBC with Differential - Hepatic function panel - Lipid panel - POCT urinalysis dipstick  2. History of prostate cancer Per urology -  PSA  3. Need for shingles vaccine - zoster vaccine live, PF, (ZOSTAVAX) 19147 UNT/0.65ML injection; Inject 19,400 Units into the skin once.  Dispense: 1 each; Refill: 0  4. Need for prophylactic vaccination with combined diphtheria-tetanus-pertussis (DTP) vaccine  - Tdap (ADACEL) 11-20-13.5 LF-MCG/0.5 injection; Inject 0.5 mLs into the muscle once.  Dispense: 0.5 mL; Refill: 0  5. Medicare annual wellness visit, subsequent  6. Other and unspecified hyperlipidemia Check labs  - simvastatin (ZOCOR) 20 MG tablet; 1 tab by mouth at bedtime--  Dispense: 30 tablet; Refill: [place], DO   11/11/2013

## 2013-11-11 NOTE — Progress Notes (Signed)
Pre visit review using our clinic review tool, if applicable. No additional management support is needed unless otherwise documented below in the visit note. 

## 2013-11-11 NOTE — Patient Instructions (Signed)
Preventive Care for Adults, Male A healthy lifestyle and preventive care can promote health and wellness. Preventive health guidelines for men include the following key practices:  A routine yearly physical is a good way to check with your health care provider about your health and preventative screening. It is a chance to share any concerns and updates on your health and to receive a thorough exam.  Visit your dentist for a routine exam and preventative care every 6 months. Brush your teeth twice a day and floss once a day. Good oral hygiene prevents tooth decay and gum disease.  The frequency of eye exams is based on your age, health, family medical history, use of contact lenses, and other factors. Follow your health care provider's recommendations for frequency of eye exams.  Eat a healthy diet. Foods such as vegetables, fruits, whole grains, low-fat dairy products, and lean protein foods contain the nutrients you need without too many calories. Decrease your intake of foods high in solid fats, added sugars, and salt. Eat the right amount of calories for you.Get information about a proper diet from your health care provider, if necessary.  Regular physical exercise is one of the most important things you can do for your health. Most adults should get at least 150 minutes of moderate-intensity exercise (any activity that increases your heart rate and causes you to sweat) each week. In addition, most adults need muscle-strengthening exercises on 2 or more days a week.  Maintain a healthy weight. The body mass index (BMI) is a screening tool to identify possible weight problems. It provides an estimate of body fat based on height and weight. Your health care provider can find your BMI and can help you achieve or maintain a healthy weight.For adults 20 years and older:  A BMI below 18.5 is considered underweight.  A BMI of 18.5 to 24.9 is normal.  A BMI of 25 to 29.9 is considered  overweight.  A BMI of 30 and above is considered obese.  Maintain normal blood lipids and cholesterol levels by exercising and minimizing your intake of saturated fat. Eat a balanced diet with plenty of fruit and vegetables. Blood tests for lipids and cholesterol should begin at age 42 and be repeated every 5 years. If your lipid or cholesterol levels are high, you are over 50, or you are at high risk for heart disease, you may need your cholesterol levels checked more frequently.Ongoing high lipid and cholesterol levels should be treated with medicines if diet and exercise are not working.  If you smoke, find out from your health care provider how to quit. If you do not use tobacco, do not start.  Lung cancer screening is recommended for adults aged 24 80 years who are at high risk for developing lung cancer because of a history of smoking. A yearly low-dose CT scan of the lungs is recommended for people who have at least a 30-pack-year history of smoking and are a current smoker or have quit within the past 15 years. A pack year of smoking is smoking an average of 1 pack of cigarettes a day for 1 year (for example: 1 pack a day for 30 years or 2 packs a day for 15 years). Yearly screening should continue until the smoker has stopped smoking for at least 15 years. Yearly screening should be stopped for people who develop a health problem that would prevent them from having lung cancer treatment.  If you choose to drink alcohol, do not have  more than 2 drinks per day. One drink is considered to be 12 ounces (355 mL) of beer, 5 ounces (148 mL) of wine, or 1.5 ounces (44 mL) of liquor.  Avoid use of street drugs. Do not share needles with anyone. Ask for help if you need support or instructions about stopping the use of drugs.  High blood pressure causes heart disease and increases the risk of stroke. Your blood pressure should be checked at least every 1 2 years. Ongoing high blood pressure should be  treated with medicines, if weight loss and exercise are not effective.  If you are 75 78 years old, ask your health care provider if you should take aspirin to prevent heart disease.  Diabetes screening involves taking a blood sample to check your fasting blood sugar level. This should be done once every 3 years, after age 19, if you are within normal weight and without risk factors for diabetes. Testing should be considered at a younger age or be carried out more frequently if you are overweight and have at least 1 risk factor for diabetes.  Colorectal cancer can be detected and often prevented. Most routine colorectal cancer screening begins at the age of 47 and continues through age 80. However, your health care provider may recommend screening at an earlier age if you have risk factors for colon cancer. On a yearly basis, your health care provider may provide home test kits to check for hidden blood in the stool. Use of a small camera at the end of a tube to directly examine the colon (sigmoidoscopy or colonoscopy) can detect the earliest forms of colorectal cancer. Talk to your health care provider about this at age 66, when routine screening begins. Direct exam of the colon should be repeated every 5 10 years through age 19, unless early forms of precancerous polyps or small growths are found.  People who are at an increased risk for hepatitis B should be screened for this virus. You are considered at high risk for hepatitis B if:  You were born in a country where hepatitis B occurs often. Talk with your health care provider about which countries are considered high-risk.  Your parents were born in a high-risk country and you have not received a shot to protect against hepatitis B (hepatitis B vaccine).  You have HIV or AIDS.  You use needles to inject street drugs.  You live with, or have sex with, someone who has hepatitis B.  You are a man who has sex with other men (MSM).  You get  hemodialysis treatment.  You take certain medicines for conditions such as cancer, organ transplantation, and autoimmune conditions.  Hepatitis C blood testing is recommended for all people born from [birth year] through 1965 and any individual with known risks for hepatitis C.  Practice safe sex. Use condoms and avoid high-risk sexual practices to reduce the spread of sexually transmitted infections (STIs). STIs include gonorrhea, chlamydia, syphilis, trichomonas, herpes, HPV, and human immunodeficiency virus (HIV). Herpes, HIV, and HPV are viral illnesses that have no cure. They can result in disability, cancer, and death.  A one-time screening for abdominal aortic aneurysm (AAA) and surgical repair of large AAAs by ultrasound are recommended for men ages 94 to 74 years who are current or former smokers.  Healthy men should no longer receive prostate-specific antigen (PSA) blood tests as part of routine cancer screening. Talk with your health care provider about prostate cancer screening.  Testicular cancer screening is not recommended  for adult males who have no symptoms. Screening includes self-exam, a health care provider exam, and other screening tests. Consult with your health care provider about any symptoms you have or any concerns you have about testicular cancer.  Use sunscreen. Apply sunscreen liberally and repeatedly throughout the day. You should seek shade when your shadow is shorter than you. Protect yourself by wearing long sleeves, pants, a wide-brimmed hat, and sunglasses year round, whenever you are outdoors.  Once a month, do a whole-body skin exam, using a mirror to look at the skin on your back. Tell your health care provider about new moles, moles that have irregular borders, moles that are larger than a pencil eraser, or moles that have changed in shape or color.  Stay current with required vaccines (immunizations).  Influenza vaccine. All adults should be immunized every  year.  Tetanus, diphtheria, and acellular pertussis (Td, Tdap) vaccine. An adult who has not previously received Tdap or who does not know his vaccine status should receive 1 dose of Tdap. This initial dose should be followed by tetanus and diphtheria toxoids (Td) booster doses every 10 years. Adults with an unknown or incomplete history of completing a 3-dose immunization series with Td-containing vaccines should begin or complete a primary immunization series including a Tdap dose. Adults should receive a Td booster every 10 years.  Varicella vaccine. An adult without evidence of immunity to varicella should receive 2 doses or a second dose if he has previously received 1 dose.  Human papillomavirus (HPV) vaccine. Males aged 44 21 years who have not received the vaccine previously should receive the 3-dose series. Males aged 43 26 years may be immunized. Immunization is recommended through the age of 50 years for any male who has sex with males and did not get any or all doses earlier. Immunization is recommended for any person with an immunocompromised condition through the age of 23 years if he did not get any or all doses earlier. During the 3-dose series, the second dose should be obtained 4 8 weeks after the first dose. The third dose should be obtained 24 weeks after the first dose and 16 weeks after the second dose.  Zoster vaccine. One dose is recommended for adults aged 96 years or older unless certain conditions are present.  Measles, mumps, and rubella (MMR) vaccine. Adults born before 55 generally are considered immune to measles and mumps. Adults born in [birth year] or later should have 1 or more doses of MMR vaccine unless there is a contraindication to the vaccine or there is laboratory evidence of immunity to each of the three diseases. A routine second dose of MMR vaccine should be obtained at least 28 days after the first dose for students attending postsecondary schools, health care  workers, or international travelers. People who received inactivated measles vaccine or an unknown type of measles vaccine during 1963 1967 should receive 2 doses of MMR vaccine. People who received inactivated mumps vaccine or an unknown type of mumps vaccine before 1979 and are at high risk for mumps infection should consider immunization with 2 doses of MMR vaccine. Unvaccinated health care workers born before [birth year] who lack laboratory evidence of measles, mumps, or rubella immunity or laboratory confirmation of disease should consider measles and mumps immunization with 2 doses of MMR vaccine or rubella immunization with 1 dose of MMR vaccine.  Pneumococcal 13-valent conjugate (PCV13) vaccine. When indicated, a person who is uncertain of his immunization history and has no record of immunization  should receive the PCV13 vaccine. An adult aged 67 years or older who has certain medical conditions and has not been previously immunized should receive 1 dose of PCV13 vaccine. This PCV13 should be followed with a dose of pneumococcal polysaccharide (PPSV23) vaccine. The PPSV23 vaccine dose should be obtained at least 8 weeks after the dose of PCV13 vaccine. An adult aged 79 years or older who has certain medical conditions and previously received 1 or more doses of PPSV23 vaccine should receive 1 dose of PCV13. The PCV13 vaccine dose should be obtained 1 or more years after the last PPSV23 vaccine dose.  Pneumococcal polysaccharide (PPSV23) vaccine. When PCV13 is also indicated, PCV13 should be obtained first. All adults aged 74 years and older should be immunized. An adult younger than age 50 years who has certain medical conditions should be immunized. Any person who resides in a nursing home or long-term care facility should be immunized. An adult smoker should be immunized. People with an immunocompromised condition and certain other conditions should receive both PCV13 and PPSV23 vaccines. People with human  immunodeficiency virus (HIV) infection should be immunized as soon as possible after diagnosis. Immunization during chemotherapy or radiation therapy should be avoided. Routine use of PPSV23 vaccine is not recommended for American Indians, [place] Natives, or people younger than 65 years unless there are medical conditions that require PPSV23 vaccine. When indicated, people who have unknown immunization and have no record of immunization should receive PPSV23 vaccine. One-time revaccination 5 years after the first dose of PPSV23 is recommended for people aged 41 64 years who have chronic kidney failure, nephrotic syndrome, asplenia, or immunocompromised conditions. People who received 1 2 doses of PPSV23 before age 15 years should receive another dose of PPSV23 vaccine at age 48 years or later if at least 5 years have passed since the previous dose. Doses of PPSV23 are not needed for people immunized with PPSV23 at or after age 69 years.  Meningococcal vaccine. Adults with asplenia or persistent complement component deficiencies should receive 2 doses of quadrivalent meningococcal conjugate (MenACWY-D) vaccine. The doses should be obtained at least 2 months apart. Microbiologists working with certain meningococcal bacteria, [place] recruits, people at risk during an outbreak, and people who travel to or live in countries with a high rate of meningitis should be immunized. A first-year college student up through age 7 years who is living in a residence hall should receive a dose if he did not receive a dose on or after his 16th birthday. Adults who have certain high-risk conditions should receive one or more doses of vaccine.  Hepatitis A vaccine. Adults who wish to be protected from this disease, have certain high-risk conditions, work with hepatitis A-infected animals, work in hepatitis A research labs, or travel to or work in countries with a high rate of hepatitis A should be immunized. Adults who were  previously unvaccinated and who anticipate close contact with an international adoptee during the first 60 days after arrival in the [place] from a country with a high rate of hepatitis A should be immunized.  Hepatitis B vaccine. Adults who wish to be protected from this disease, have certain high-risk conditions, may be exposed to blood or other infectious body fluids, are household contacts or sex partners of hepatitis B positive people, are clients or workers in certain care facilities, or travel to or work in countries with a high rate of hepatitis B should be immunized.  Haemophilus influenzae type b (Hib) vaccine. A  previously unvaccinated person with asplenia or sickle cell disease or having a scheduled splenectomy should receive 1 dose of Hib vaccine. Regardless of previous immunization, a recipient of a hematopoietic stem cell transplant should receive a 3-dose series 6 12 months after his successful transplant. Hib vaccine is not recommended for adults with HIV infection. Preventive Service / Frequency Ages 62 to 3  Blood pressure check.** / Every 1 to 2 years.  Lipid and cholesterol check.** / Every 5 years beginning at age 43.  Hepatitis C blood test.** / For any individual with known risks for hepatitis C.  Skin self-exam. / Monthly.  Influenza vaccine. / Every year.  Tetanus, diphtheria, and acellular pertussis (Tdap, Td) vaccine.** / Consult your health care provider. 1 dose of Td every 10 years.  Varicella vaccine.** / Consult your health care provider.  HPV vaccine. / 3 doses over 6 months, if 48 or younger.  Measles, mumps, rubella (MMR) vaccine.** / You need at least 1 dose of MMR if you were born in 1957 or later. You may also need a second dose.  Pneumococcal 13-valent conjugate (PCV13) vaccine.** / Consult your health care provider.  Pneumococcal polysaccharide (PPSV23) vaccine.** / 1 to 2 doses if you smoke cigarettes or if you have certain  conditions.  Meningococcal vaccine.** / 1 dose if you are age 8 to 70 years and a Market researcher living in a residence hall, or have one of several medical conditions. You may also need additional booster doses.  Hepatitis A vaccine.** / Consult your health care provider.  Hepatitis B vaccine.** / Consult your health care provider.  Haemophilus influenzae type b (Hib) vaccine.** / Consult your health care provider. Ages 48 to 32  Blood pressure check.** / Every 1 to 2 years.  Lipid and cholesterol check.** / Every 5 years beginning at age 38.  Lung cancer screening. / Every year if you are aged 40 80 years and have a 30-pack-year history of smoking and currently smoke or have quit within the past 15 years. Yearly screening is stopped once you have quit smoking for at least 15 years or develop a health problem that would prevent you from having lung cancer treatment.  Fecal occult blood test (FOBT) of stool. / Every year beginning at age 4 and continuing until age 70. You may not have to do this test if you get a colonoscopy every 10 years.  Flexible sigmoidoscopy** or colonoscopy.** / Every 5 years for a flexible sigmoidoscopy or every 10 years for a colonoscopy beginning at age 76 and continuing until age 62.  Hepatitis C blood test.** / For all people born from [birth year] through 1965 and any individual with known risks for hepatitis C.  Skin self-exam. / Monthly.  Influenza vaccine. / Every year.  Tetanus, diphtheria, and acellular pertussis (Tdap/Td) vaccine.** / Consult your health care provider. 1 dose of Td every 10 years.  Varicella vaccine.** / Consult your health care provider.  Zoster vaccine.** / 1 dose for adults aged 60 years or older.  Measles, mumps, rubella (MMR) vaccine.** / You need at least 1 dose of MMR if you were born in 1957 or later. You may also need a second dose.  Pneumococcal 13-valent conjugate (PCV13) vaccine.** / Consult your health care  provider.  Pneumococcal polysaccharide (PPSV23) vaccine.** / 1 to 2 doses if you smoke cigarettes or if you have certain conditions.  Meningococcal vaccine.** / Consult your health care provider.  Hepatitis A vaccine.** / Consult your health care  provider.  Hepatitis B vaccine.** / Consult your health care provider.  Haemophilus influenzae type b (Hib) vaccine.** / Consult your health care provider. Ages 65 and over  Blood pressure check.** / Every 1 to 2 years.  Lipid and cholesterol check.**/ Every 5 years beginning at age 20.  Lung cancer screening. / Every year if you are aged 55 80 years and have a 30-pack-year history of smoking and currently smoke or have quit within the past 15 years. Yearly screening is stopped once you have quit smoking for at least 15 years or develop a health problem that would prevent you from having lung cancer treatment.  Fecal occult blood test (FOBT) of stool. / Every year beginning at age 50 and continuing until age 75. You may not have to do this test if you get a colonoscopy every 10 years.  Flexible sigmoidoscopy** or colonoscopy.** / Every 5 years for a flexible sigmoidoscopy or every 10 years for a colonoscopy beginning at age 50 and continuing until age 75.  Hepatitis C blood test.** / For all people born from 1945 through 1965 and any individual with known risks for hepatitis C.  Abdominal aortic aneurysm (AAA) screening.** / A one-time screening for ages 65 to 75 years who are current or former smokers.  Skin self-exam. / Monthly.  Influenza vaccine. / Every year.  Tetanus, diphtheria, and acellular pertussis (Tdap/Td) vaccine.** / 1 dose of Td every 10 years.  Varicella vaccine.** / Consult your health care provider.  Zoster vaccine.** / 1 dose for adults aged 60 years or older.  Pneumococcal 13-valent conjugate (PCV13) vaccine.** / Consult your health care provider.  Pneumococcal polysaccharide (PPSV23) vaccine.** / 1 dose for all  adults aged 65 years and older.  Meningococcal vaccine.** / Consult your health care provider.  Hepatitis A vaccine.** / Consult your health care provider.  Hepatitis B vaccine.** / Consult your health care provider.  Haemophilus influenzae type b (Hib) vaccine.** / Consult your health care provider. **Family history and personal history of risk and conditions may change your health care provider's recommendations. Document Released: 09/03/2001 Document Revised: 04/28/2013 Document Reviewed: 12/03/2010 [employer] Patient Information 2014 [employer].  

## 2013-11-22 ENCOUNTER — Other Ambulatory Visit: Payer: Self-pay

## 2013-11-22 DIAGNOSIS — E785 Hyperlipidemia, unspecified: Secondary | ICD-10-CM

## 2013-11-22 MED ORDER — SIMVASTATIN 20 MG PO TABS: ORAL_TABLET | ORAL | Status: DC

## 2013-11-22 NOTE — Telephone Encounter (Signed)
Rx faxed.    KP 

## 2014-05-16 ENCOUNTER — Other Ambulatory Visit (INDEPENDENT_AMBULATORY_CARE_PROVIDER_SITE_OTHER): Payer: Medicare Other

## 2014-05-16 DIAGNOSIS — E785 Hyperlipidemia, unspecified: Secondary | ICD-10-CM

## 2014-05-16 LAB — HEPATIC FUNCTION PANEL
ALK PHOS: 66 U/L (ref 39–117)
ALT: 21 U/L (ref 0–53)
AST: 20 U/L (ref 0–37)
Albumin: 3.8 g/dL (ref 3.5–5.2)
Bilirubin, Direct: 0 mg/dL (ref 0.0–0.3)
TOTAL PROTEIN: 7.4 g/dL (ref 6.0–8.3)
Total Bilirubin: 0.6 mg/dL (ref 0.2–1.2)

## 2014-05-16 LAB — BASIC METABOLIC PANEL
BUN: 16 mg/dL (ref 6–23)
CALCIUM: 9.3 mg/dL (ref 8.4–10.5)
CO2: 28 meq/L (ref 19–32)
CREATININE: 1 mg/dL (ref 0.4–1.5)
Chloride: 107 mEq/L (ref 96–112)
GFR: 75 mL/min (ref >60.00)
Glucose, Bld: 132 mg/dL — ABNORMAL HIGH (ref 70–99)
Potassium: 4.1 mEq/L (ref 3.5–5.1)
Sodium: 141 mEq/L (ref 135–145)

## 2014-05-16 LAB — LIPID PANEL
Cholesterol: 183 mg/dL (ref 0–200)
HDL: 37.3 mg/dL — AB (ref >39.00)
LDL Cholesterol: 124 mg/dL — ABNORMAL HIGH (ref 0–99)
NONHDL: 145.7
TRIGLYCERIDES: 110 mg/dL (ref 0.0–149.0)
Total CHOL/HDL Ratio: 5
VLDL: 22 mg/dL (ref 0.0–40.0)

## 2014-05-24 ENCOUNTER — Other Ambulatory Visit (INDEPENDENT_AMBULATORY_CARE_PROVIDER_SITE_OTHER): Payer: Medicare Other

## 2014-05-24 DIAGNOSIS — R7309 Other abnormal glucose: Secondary | ICD-10-CM

## 2014-05-24 LAB — BASIC METABOLIC PANEL
BUN: 17 mg/dL (ref 6–23)
CALCIUM: 9.4 mg/dL (ref 8.4–10.5)
CO2: 25 mEq/L (ref 19–32)
Chloride: 107 mEq/L (ref 96–112)
Creatinine, Ser: 1 mg/dL (ref 0.4–1.5)
GFR: 77.68 mL/min (ref >60.00)
Glucose, Bld: 125 mg/dL — ABNORMAL HIGH (ref 70–99)
Potassium: 4.3 mEq/L (ref 3.5–5.1)
Sodium: 140 mEq/L (ref 135–145)

## 2014-05-24 LAB — HEMOGLOBIN A1C: Hgb A1c MFr Bld: 6.5 % (ref 4.6–6.5)

## 2014-10-03 ENCOUNTER — Telehealth: Payer: Self-pay | Admitting: Family Medicine

## 2014-10-03 DIAGNOSIS — R739 Hyperglycemia, unspecified: Secondary | ICD-10-CM

## 2014-10-03 DIAGNOSIS — E785 Hyperlipidemia, unspecified: Secondary | ICD-10-CM

## 2014-10-03 NOTE — Telephone Encounter (Signed)
Lab appt schedule for pt's

## 2014-10-03 NOTE — Telephone Encounter (Signed)
Order are in. Please schedule.      KP

## 2014-10-03 NOTE — Telephone Encounter (Signed)
Caller name:Skolnik, Arzell Relation to HQ:[id number] Call back [place]:  Reason for call: pt states that dr. Etter Sjogren informed him that he needed to do labs again in 6 months, pt is needing order placed so that he can make his appt. Also states that he and his wife comes at the same time for labs. Shadi, Sessler ([birth year]).

## 2014-10-04 ENCOUNTER — Other Ambulatory Visit (INDEPENDENT_AMBULATORY_CARE_PROVIDER_SITE_OTHER): Payer: Medicare Other

## 2014-10-04 DIAGNOSIS — E785 Hyperlipidemia, unspecified: Secondary | ICD-10-CM

## 2014-10-04 DIAGNOSIS — R739 Hyperglycemia, unspecified: Secondary | ICD-10-CM

## 2014-10-04 LAB — BASIC METABOLIC PANEL
BUN: 22 mg/dL (ref 6–23)
CHLORIDE: 107 meq/L (ref 96–112)
CO2: 30 mEq/L (ref 19–32)
Calcium: 10 mg/dL (ref 8.4–10.5)
Creatinine, Ser: 1.05 mg/dL (ref 0.40–1.50)
GFR: 70.83 mL/min (ref >60.00)
Glucose, Bld: 118 mg/dL — ABNORMAL HIGH (ref 70–99)
POTASSIUM: 4.5 meq/L (ref 3.5–5.1)
Sodium: 140 mEq/L (ref 135–145)

## 2014-10-04 LAB — LIPID PANEL
CHOL/HDL RATIO: 4
CHOLESTEROL: 171 mg/dL (ref 0–200)
HDL: 41.4 mg/dL (ref >39.00)
LDL Cholesterol: 97 mg/dL (ref 0–99)
NonHDL: 129.6
Triglycerides: 163 mg/dL — ABNORMAL HIGH (ref 0.0–149.0)
VLDL: 32.6 mg/dL (ref 0.0–40.0)

## 2014-10-04 LAB — HEPATIC FUNCTION PANEL
ALBUMIN: 4.6 g/dL (ref 3.5–5.2)
ALT: 25 U/L (ref 0–53)
AST: 24 U/L (ref 0–37)
Alkaline Phosphatase: 63 U/L (ref 39–117)
Bilirubin, Direct: 0.2 mg/dL (ref 0.0–0.3)
TOTAL PROTEIN: 7.2 g/dL (ref 6.0–8.3)
Total Bilirubin: 0.7 mg/dL (ref 0.2–1.2)

## 2014-10-04 LAB — HEMOGLOBIN A1C: HEMOGLOBIN A1C: 6.3 % (ref 4.6–6.5)

## 2015-02-10 ENCOUNTER — Ambulatory Visit: Payer: Medicare Other | Admitting: Family Medicine

## 2015-02-21 ENCOUNTER — Encounter: Payer: Self-pay | Admitting: Family Medicine

## 2015-02-21 ENCOUNTER — Ambulatory Visit (INDEPENDENT_AMBULATORY_CARE_PROVIDER_SITE_OTHER): Payer: Medicare Other | Admitting: Family Medicine

## 2015-02-21 VITALS — BP 138/86 | HR 67 | Temp 98.3°F | Ht 70.0 in | Wt 171.0 lb

## 2015-02-21 DIAGNOSIS — E785 Hyperlipidemia, unspecified: Secondary | ICD-10-CM

## 2015-02-21 DIAGNOSIS — R739 Hyperglycemia, unspecified: Secondary | ICD-10-CM | POA: Diagnosis not present

## 2015-02-21 LAB — LIPID PANEL
CHOL/HDL RATIO: 4
CHOLESTEROL: 192 mg/dL (ref 0–200)
HDL: 44.6 mg/dL (ref >39.00)
LDL Cholesterol: 119 mg/dL — ABNORMAL HIGH (ref 0–99)
NONHDL: 147.05
Triglycerides: 140 mg/dL (ref 0.0–149.0)
VLDL: 28 mg/dL (ref 0.0–40.0)

## 2015-02-21 LAB — HEPATIC FUNCTION PANEL
ALBUMIN: 4.3 g/dL (ref 3.5–5.2)
ALK PHOS: 57 U/L (ref 39–117)
ALT: 20 U/L (ref 0–53)
AST: 21 U/L (ref 0–37)
BILIRUBIN TOTAL: 0.6 mg/dL (ref 0.2–1.2)
Bilirubin, Direct: 0.1 mg/dL (ref 0.0–0.3)
Total Protein: 6.9 g/dL (ref 6.0–8.3)

## 2015-02-21 LAB — BASIC METABOLIC PANEL
BUN: 17 mg/dL (ref 6–23)
CO2: 30 mEq/L (ref 19–32)
CREATININE: 0.91 mg/dL (ref 0.40–1.50)
Calcium: 9.5 mg/dL (ref 8.4–10.5)
Chloride: 106 mEq/L (ref 96–112)
GFR: 83.48 mL/min (ref >60.00)
GLUCOSE: 115 mg/dL — AB (ref 70–99)
POTASSIUM: 4.2 meq/L (ref 3.5–5.1)
Sodium: 140 mEq/L (ref 135–145)

## 2015-02-21 LAB — HEMOGLOBIN A1C: Hgb A1c MFr Bld: 6.2 % (ref 4.6–6.5)

## 2015-02-21 NOTE — Patient Instructions (Signed)

## 2015-02-21 NOTE — Progress Notes (Signed)
Pre visit review using our clinic review tool, if applicable. No additional management support is needed unless otherwise documented below in the visit note. 

## 2015-02-21 NOTE — Progress Notes (Signed)
Patient ID: Evan Wright, male    DOB: [birth year]  Age: 81 y.o. MRN: [id number]    Subjective:  Subjective HPI ALEXIO SROKA presents for f/u cholesterol and sugar.  Pt was concerned about dm and stopped the zocor.   Pt c/o balance issues he has fallen a few times but states he just tripped.  He is not interested in any PT or other referrals.  Review of Systems  Constitutional: Negative for diaphoresis, appetite change, fatigue and unexpected weight change.  Eyes: Negative for pain, redness and visual disturbance.  Respiratory: Negative for cough, chest tightness, shortness of breath and wheezing.   Cardiovascular: Negative for chest pain, palpitations and leg swelling.  Endocrine: Negative for cold intolerance, heat intolerance, polydipsia, polyphagia and polyuria.  Genitourinary: Negative for dysuria, frequency and difficulty urinating.  Neurological: Negative for dizziness, light-headedness, numbness and headaches.  Psychiatric/Behavioral: Negative for decreased concentration. The patient is not nervous/anxious.     History Past Medical History  Diagnosis Date  . Osteoarthritis   . Hyperlipidemia   . Diverticulosis   . Prostate cancer   . Adenomatous colon polyp     He has past surgical history that includes Prostatectomy; Tonsillectomy; Eye surgery (2012); and Appendectomy (1948).   His family history includes Colitis in his mother; Colon cancer in his brother; Lung cancer in his father; Prostate cancer in his father.He reports that he has never smoked. He has never used smokeless tobacco. He reports that he does not drink alcohol or use illicit drugs.  Current Outpatient Prescriptions on File Prior to Visit  Medication Sig Dispense Refill  . Calcium Carbonate (CALTRATE 600) 1500 MG TABS Take by mouth daily.      . Psyllium (METAMUCIL PO) Take by mouth daily.     No current facility-administered medications on file prior to visit.     Objective:  Objective Physical Exam   Constitutional: He is oriented to person, place, and time. Vital signs are normal. He appears well-developed and well-nourished. He is sleeping.  HENT:  Head: Normocephalic and atraumatic.  Mouth/Throat: Oropharynx is clear and moist.  Eyes: EOM are normal. Pupils are equal, round, and reactive to light.  Neck: Normal range of motion. Neck supple. No thyromegaly present.  Cardiovascular: Normal rate and regular rhythm.   No murmur heard. Pulmonary/Chest: Effort normal and breath sounds normal. No respiratory distress. He has no wheezes. He has no rales. He exhibits no tenderness.  Musculoskeletal: He exhibits no edema or tenderness.  Neurological: He is alert and oriented to person, place, and time.  Skin: Skin is warm and dry.  Psychiatric: He has a normal mood and affect. His behavior is normal. Judgment and thought content normal.   BP 138/86 mmHg  Pulse 67  Temp(Src) 98.3 F (36.8 C) (Oral)  Ht 5\' 10"  (1.778 m)  Wt 171 lb (77.565 kg)  BMI 24.54 kg/m2  SpO2 98% Wt Readings from Last 3 Encounters:  02/21/15 171 lb (77.565 kg)  11/11/13 180 lb (81.647 kg)  03/31/13 177 lb 6 oz (80.457 kg)     Lab Results  Component Value Date   WBC 6.9 11/11/2013   HGB 16.3 11/11/2013   HCT 48.1 11/11/2013   PLT 214.0 11/11/2013   GLUCOSE 115* 02/21/2015   CHOL 192 02/21/2015   TRIG 140.0 02/21/2015   HDL 44.60 02/21/2015   LDLDIRECT 165.3 10/08/2010   LDLCALC 119* 02/21/2015   ALT 20 02/21/2015   AST 21 02/21/2015   NA 140 02/21/2015  K 4.2 02/21/2015   CL 106 02/21/2015   CREATININE 0.91 02/21/2015   BUN 17 02/21/2015   CO2 30 02/21/2015   TSH 5.05 02/11/2008   PSA 0.01* 11/11/2013   HGBA1C 6.2 02/21/2015    No results found.   Assessment & Plan:  Plan I have discontinued Mr. Clasby aspirin, zoster vaccine live (PF), Tdap, and simvastatin. I am also having him maintain his Calcium Carbonate, Psyllium (METAMUCIL PO), and EYE VITAMINS.  Meds ordered this encounter    Medications  . Multiple Vitamins-Minerals (EYE VITAMINS) CAPS    Sig: Take 1 capsule by mouth daily.    Problem List Items Addressed This Visit    Hyperlipidemia - Primary    Pt stopped med because he was concerned about DM Check labs today      Relevant Orders   Hepatic function panel (Completed)   Lipid panel (Completed)    Other Visit Diagnoses    Hyperglycemia        Relevant Orders    Basic metabolic panel (Completed)    Hemoglobin A1c (Completed)       Follow-up: Return if symptoms worsen or fail to improve, for annual exam, fasting.  Garnet Koyanagi, DO

## 2015-02-22 NOTE — Assessment & Plan Note (Signed)
Pt stopped med because he was concerned about DM Check labs today

## 2015-03-17 ENCOUNTER — Encounter: Payer: Self-pay | Admitting: Gastroenterology

## 2015-05-24 ENCOUNTER — Telehealth: Payer: Self-pay | Admitting: Family Medicine

## 2015-05-24 DIAGNOSIS — Z1211 Encounter for screening for malignant neoplasm of colon: Secondary | ICD-10-CM

## 2015-05-24 NOTE — Telephone Encounter (Signed)
Caller name: Pleasant Bensinger   Relationship to patient: Self   Can be reached: [phone]   Reason for call: Pt would like to speak with CMA directly. He says that it is hard to explain to me what he has to speak with you about

## 2015-05-25 NOTE — Telephone Encounter (Signed)
Ok let pt know.

## 2015-05-25 NOTE — Telephone Encounter (Signed)
Can he get a colonoscopy since the cologuard is not covered?

## 2015-05-25 NOTE — Telephone Encounter (Signed)
Spoke with the patient and he wanted to get the Cologuard done,he said Dr.Lowe told him that she was going to order I. I advised I was not made aware and he verbalized understanding. He wanted to know if his insurance would cover it and I advised I was not sure, I called the insurance company and it is not covered but the colonoscopy is. I tried to call the patient back but the line is busy.      KP

## 2015-05-26 NOTE — Telephone Encounter (Signed)
Spoke with patient and he stated that he had 9 polyps removed his colon and was told to recheck in 1 year, and he is concerned, but GI told him he was too old for the colonoscopy. He said she will call [employer] and try to get them to approve his Cologuard. He wanted to know if there was a CT scan or something else he could do, and other  procedure?     KP

## 2015-05-27 NOTE — Telephone Encounter (Signed)
If Gi said repeat in 1 year than we will get him an appointment with GI to discuss this--- ok for referral

## 2015-05-29 ENCOUNTER — Telehealth: Payer: Self-pay

## 2015-05-29 NOTE — Addendum Note (Signed)
Addended by: Ewing Schlein on: 05/29/2015 08:53 AM   Modules accepted: Orders

## 2015-05-29 NOTE — Telephone Encounter (Signed)
Called patient to schedule annual wellness visit

## 2015-06-12 ENCOUNTER — Ambulatory Visit: Payer: Medicare Other | Admitting: Gastroenterology

## 2015-08-11 ENCOUNTER — Ambulatory Visit: Payer: Medicare Other | Admitting: Gastroenterology

## 2015-09-21 ENCOUNTER — Telehealth: Payer: Self-pay | Admitting: Family Medicine

## 2015-09-21 NOTE — Telephone Encounter (Signed)
Patient declined flu shot  °

## 2015-09-21 NOTE — Telephone Encounter (Signed)
Noted  KP 

## 2015-09-27 ENCOUNTER — Encounter: Payer: Self-pay | Admitting: Gastroenterology

## 2015-09-27 ENCOUNTER — Ambulatory Visit (INDEPENDENT_AMBULATORY_CARE_PROVIDER_SITE_OTHER): Payer: Medicare Other | Admitting: Gastroenterology

## 2015-09-27 VITALS — BP 160/80 | HR 68 | Ht 69.5 in | Wt 175.8 lb

## 2015-09-27 DIAGNOSIS — Z86010 Personal history of colonic polyps: Secondary | ICD-10-CM

## 2015-09-27 NOTE — Progress Notes (Signed)
HPI :  82 y/o male here to discuss possible surveillance colonoscopy. He has been previously followed by Dr. Deatra Ina. He has had multiple colonoscopies over the years with adenomas removed.  His last colonoscopy was in 2013 in which he had 10 adenomas removed, however one was 1cm and all the rest were small and < 1cm . He reports some baseline constipation, but no significant bowel changes. He takes metamucil PRN. He has rare blood in the stools he thinks is due to hemorrhoids and has been chronic and unchanged. No abdominal pains. He is eating well without difficulty. No weight loss. His brother he thinks had a large polyp removed surgically, but no colon cancer. His mother passed away of C diff colitis.  He has a history of prostate cancer which was treated, he thinks about 20 years ago. He is otherwise very healthy, does not take any prescription medications, and is quite active without limitations. No history of anemia  Endoscopic history: Colonoscopy 08/2011 - > 10 adenomas Colonoscopy 5.2009 - 3 adenomas Colonoscopy 1/06 - 3 adenomas Colonoscopy 10/04  - 6 right sided adenomas   Past Medical History  Diagnosis Date  . Osteoarthritis   . Hyperlipidemia   . Diverticulosis   . Prostate cancer ([place])   . Adenomatous colon polyp      Past Surgical History  Procedure Laterality Date  . Prostatectomy    . Tonsillectomy    . Eye surgery  2012    march and April  --Dr [name]  . Appendectomy  1948  . Colonoscopy     Family History  Problem Relation Age of Onset  . Lung cancer Father   . Prostate cancer Father   . Colon cancer Brother   . Colitis Mother    Social History  Substance Use Topics  . Smoking status: Never Smoker   . Smokeless tobacco: Never Used  . Alcohol Use: No   Current Outpatient Prescriptions  Medication Sig Dispense Refill  . Calcium Carbonate (CALTRATE 600) 1500 MG TABS Take by mouth daily.      . Multiple Vitamins-Minerals (EYE VITAMINS) CAPS Take 1  capsule by mouth daily.    . Psyllium (METAMUCIL PO) Take by mouth daily.     No current facility-administered medications for this visit.   Allergies  Allergen Reactions  . Penicillins Other (See Comments)    unknown     Review of Systems: All systems reviewed and negative except where noted in HPI.   Lab Results  Component Value Date   WBC 6.9 11/11/2013   HGB 16.3 11/11/2013   HCT 48.1 11/11/2013   MCV 92.1 11/11/2013   PLT 214.0 11/11/2013    Lab Results  Component Value Date   ALT 20 02/21/2015   AST 21 02/21/2015   ALKPHOS 57 02/21/2015   BILITOT 0.6 02/21/2015    Lab Results  Component Value Date   CREATININE 0.91 02/21/2015   BUN 17 02/21/2015   NA 140 02/21/2015   K 4.2 02/21/2015   CL 106 02/21/2015   CO2 30 02/21/2015     Physical Exam: BP 160/80 mmHg  Pulse 68  Ht 5' 9.5" (1.765 m)  Wt 175 lb 12.8 oz (79.742 kg)  BMI 25.60 kg/m2 Constitutional: Pleasant,well-developed, male in no acute distress. HEENT: Normocephalic and atraumatic. Conjunctivae are normal. No scleral icterus. Neck supple.  Cardiovascular: Normal rate, regular rhythm.  Pulmonary/chest: Effort normal and breath sounds normal. No wheezing, rales or rhonchi. Abdominal: Soft, nondistended, nontender. Bowel sounds  active throughout. There are no masses palpable. No hepatomegaly. Extremities: trace to (+) 1 edema Lymphadenopathy: No cervical adenopathy noted. Neurological: Alert and oriented to person place and time. Skin: Skin is warm and dry. No rashes noted. Psychiatric: Normal mood and affect. Behavior is normal.   ASSESSMENT AND PLAN: 82 y/o male with remote history of prostate cancer, in remission, and in otherwise good health with a history of multiple colon adenomas on prior colonoscopies. His last exam was in 2013 as outlined above, he had 10 small adenomas removed. He followed up a year later with Dr. Deatra Ina and they decided against further surveillance colonoscopy at that  time. He presents again today to discuss the utility of surveillance colonoscopy. While he is in good health for his age without significant comorbidities, I think the benefit of surveillance colonoscopy at his age of 58 is likely low at this time. He had multiple small adenomas removed at his last exam, but nothing high risk for colon cancer. We discussed the risks of anesthesia and colonoscopy. I think it is possible he has some small colon polyps at this time, but unlikely they will be clinically relevant or cause colon cancer in his life time. If he is anxious about this or strongly wished to have a colonoscopy I offered it to him, but following our discussion and weighing the risks / benefits of the exam, he wished to hold off on future surveillance colonoscopy or stool testing at this time. If he has concerns moving forward about this he can follow up as needed for reassessment. All questions answered.   Blythe Cellar, MD [facility]astro[facility] Pager [phone]

## 2015-09-27 NOTE — Patient Instructions (Signed)
   Please follow up with [place] as needed.     I appreciate the opportunity to care for you.

## 2016-04-05 DIAGNOSIS — H524 Presbyopia: Secondary | ICD-10-CM | POA: Diagnosis not present

## 2016-04-22 ENCOUNTER — Ambulatory Visit: Payer: Medicare Other | Admitting: *Deleted

## 2016-05-15 ENCOUNTER — Ambulatory Visit: Payer: Medicare Other | Admitting: *Deleted

## 2016-05-24 ENCOUNTER — Encounter: Payer: Self-pay | Admitting: *Deleted

## 2016-05-24 ENCOUNTER — Ambulatory Visit (INDEPENDENT_AMBULATORY_CARE_PROVIDER_SITE_OTHER): Payer: Medicare Other | Admitting: *Deleted

## 2016-05-24 VITALS — BP 164/84 | HR 69 | Temp 98.0°F | Resp 16 | Ht 70.0 in | Wt 178.2 lb

## 2016-05-24 DIAGNOSIS — Z0000 Encounter for general adult medical examination without abnormal findings: Secondary | ICD-10-CM

## 2016-05-24 NOTE — Progress Notes (Signed)
Pre visit review using our clinic review tool, if applicable. No additional management support is needed unless otherwise documented below in the visit note. 

## 2016-05-24 NOTE — Progress Notes (Signed)
Subjective:   Evan Wright is a 82 y.o. male who presents for Medicare Annual/Subsequent preventive examination.  Review of Systems:  No ROS.  Medicare Wellness Visit.  Cardiac Risk Factors include: advanced age (>29men, >27 women);dyslipidemia;male gender  Sleep patterns:   No issues per pt. Home Safety/Smoke Alarms:  Pt lives in a house with his wife. Pt states he feels safe. Smoke alarms working in house.  Living environment; residence and Firearm Safety: No firearms [place] Safety/Bike Helmet: Wears seatbelt.   Counseling:   Eye Exam- Follows with Dr.[facility]- Pt sees dentist in [place] every 6 months.  Male:   CCS- last 09/17/11 w/ Dr. Deatra Ina. Multiple polyps. 1 year recall, however pt seen by Dr. Deatra Ina 03/2013 and decided on no further routine examinations.      PSA-   Lab Results  Component Value Date   PSA 0.01 (L) 11/11/2013   PSA 0.01 (L) 05/18/2012   PSA 0.01 (L) 07/12/2010       Objective:    Vitals: BP (!) 164/84 (BP Location: Right Arm, Patient Position: Sitting)   Pulse 69   Temp 98 F (36.7 C) (Oral)   Resp 16   Ht 5\' 10"  (1.778 m)   Wt 178 lb 3.2 oz (80.8 kg)   SpO2 99%   BMI 25.57 kg/m   Body mass index is 25.57 kg/m.  Tobacco History  Smoking Status  . Never Smoker  Smokeless Tobacco  . Never Used     Counseling given: Not Answered   Past Medical History:  Diagnosis Date  . Adenomatous colon polyp   . Diverticulosis   . Hyperlipidemia   . Osteoarthritis   . Prostate cancer [facility])    Past Surgical History:  Procedure Laterality Date  . APPENDECTOMY  1948  . COLONOSCOPY    . EYE SURGERY  2012   march and April  --Dr [name]  . PROSTATECTOMY    . TONSILLECTOMY     Family History  Problem Relation Age of Onset  . Colon cancer Brother   . Lung cancer Father   . Prostate cancer Father   . Colitis Mother    History  Sexual Activity  . Sexual activity: Not on file    Outpatient Encounter Prescriptions as of  05/24/2016  Medication Sig  . Calcium Carbonate (CALTRATE 600) 1500 MG TABS Take by mouth daily.    . Multiple Vitamins-Minerals (EYE VITAMINS) CAPS Take 1 capsule by mouth daily.  . Psyllium (METAMUCIL PO) Take by mouth daily.   No facility-administered encounter medications on file as of 05/24/2016.     Activities of Daily Living In your present state of health, do you have any difficulty performing the following activities: 05/24/2016  Hearing? N  Vision? N  Difficulty concentrating or making decisions? N  Walking or climbing stairs? Y  Dressing or bathing? N  Doing errands, shopping? N  Preparing Food and eating ? N  Using the Toilet? N  In the past six months, have you accidently leaked urine? N  Do you have problems with loss of bowel control? Y  Managing your Medications? N  Managing your Finances? N  Housekeeping or managing your Housekeeping? N  Some recent data might be hidden    Patient Care Team: [name], DO as PCP - General Calvert Cantor, MD as Consulting Physician (Ophthalmology) Carolan Clines, MD as Consulting Physician (Urology)   Assessment:    Physical assessment deferred to PCP.  Exercise Activities and  Dietary recommendations Current Exercise Habits: The patient does not participate in regular exercise at present, Exercise limited by: Other - see comments (pt states he just has to take breaks during activity)  Diet (meal preparation, eat out, water intake, caffeinated beverages, dairy products, fruits and vegetables): well balanced. Eats 3 meals per day. Drinks water throughout the day. Enjoys coffee in the morning and occasionally tea with lunch. No sodas. Breakfast: Eggs, meat, toast, and coffee  Lunch: Meat and vegetable (often baked chicken for meat) Dinner: Meat and vegtable   Goals    . Healthy Lifestyle          Continue doing brain stimulating activities (puzzles, reading, adult coloring books, staying active) to keep memory  sharp.  Continue to eat heart healthy diet (full of fruits, vegetables, whole grains, lean protein, water--limit salt, fat, and sugar intake) and maintain physical activity as tolerated.      Fall Risk Fall Risk  05/24/2016 02/21/2015 11/11/2013  Falls in the past year? No No No  Risk for fall due to : Impaired balance/gait - -   Depression Screen PHQ 2/9 Scores 05/24/2016 02/21/2015 11/11/2013 05/18/2012  PHQ - 2 Score 0 0 0 0    Cognitive Function MMSE - Mini Mental State Exam 05/24/2016  Orientation to time 5  Orientation to Place 5  Registration 3  Attention/ Calculation 5  Recall 3  Language- name 2 objects 2  Language- repeat 1  Language- follow 3 step command 3  Language- read & follow direction 1  Write a sentence 1  Copy design 0  Total score 29        Immunization History  Administered Date(s) Administered  . Influenza Split 05/18/2012, 08/06/2014  . Influenza Whole 05/09/2010  . Influenza-Unspecified 05/22/2013  . Pneumococcal Conjugate-13 08/06/2014  . Pneumococcal Polysaccharide-23 07/03/2005   Screening Tests Health Maintenance  Topic Date Due  . INFLUENZA VACCINE  10/19/2016 (Originally 02/20/2016)  . TETANUS/TDAP  11/21/2016 (Originally 08/28/1943)  . ZOSTAVAX  05/24/2017 (Originally 08/28/1986)  . PNA vac Low Risk Adult  Completed      Plan:   BP elevated today. Pt asymptomatic. Follow-up with Dr. Carollee Herter as scheduled. Bring a copy of your advanced directives to your next office visit. Pt states he will get flu shot at pharmacy with wife.   During the course of the visit the patient was educated and counseled about the following appropriate screening and preventive services:   Vaccines to include Pneumoccal, Influenza, Hepatitis B, Td, Zostavax, HCV  Cardiovascular Disease  Colorectal cancer screening  Diabetes screening  Prostate Cancer Screening  Glaucoma screening  Nutrition counseling   Patient Instructions (the written plan) was given  to the patient.    Shela Nevin, [place]  05/24/2016

## 2016-05-24 NOTE — Patient Instructions (Addendum)
Follow-up w/ Dr. Carollee Herter as scheduled.   Bring a copy of your advanced directives to your next office visit.  Advance Directive Advance directives are the legal documents that allow you to make choices about your health care and medical treatment if you cannot speak for yourself. Advance directives are a way for you to communicate your wishes to family, friends, and health care providers. The specified people can then convey your decisions about end-of-life care to avoid confusion if you should become unable to communicate. Ideally, the process of discussing and writing advance directives should happen over time rather than making decisions all at once. Advance directives can be modified as your situation changes, and you can change your mind at any time, even after you have signed the advance directives. Each state has its own laws regarding advance directives. You may want to check with your health care provider, attorney, or state representative about the law in your state. Below are some examples of advance directives. LIVING WILL A living will is a set of instructions documenting your wishes about medical care when you cannot care for yourself. It is used if you become:  Terminally ill.  Incapacitated.  Unable to communicate.  Unable to make decisions. Items to consider in your living will include:  The use or non-use of life-sustaining equipment, such as dialysis machines and breathing machines (ventilators).  A do not resuscitate (DNR) order, which is the instruction not to use cardiopulmonary resuscitation (CPR) if breathing or heartbeat stops.  Tube feeding.  Withholding of food and fluids.  Comfort (palliative) care when the goal becomes comfort rather than a cure.  Organ and tissue donation. A living will does not give instructions about distribution of your money and property if you should pass away. It is advisable to seek the expert advice of a lawyer in drawing up a  will regarding your possessions. Decisions about taxes, beneficiaries, and asset distribution will be legally binding. This process can relieve your family and friends of any burdens surrounding disputes or questions that may come up about the allocation of your assets. DO NOT RESUSCITATE (DNR) A do not resuscitate (DNR) order is a request to not have CPR in the event that your heart stops beating or you stop breathing. Unless given other instructions, a health care provider will try to help any patient whose heart has stopped or who has stopped breathing.  HEALTH CARE PROXY AND DURABLE POWER OF ATTORNEY FOR HEALTH CARE A health care proxy is a person (agent) appointed to make medical decisions for you if you cannot. Generally, people choose someone they know well and trust to represent their preferences when they can no longer do so. You should be sure to ask this person for agreement to act as your agent. An agent may have to exercise judgment in the event of a medical decision for which your wishes are not known. The durable power of attorney for health care is the legal document that names your health care proxy. Once written, it should be:  Signed.  Notarized.  Dated.  Copied.  Witnessed.  Incorporated into your medical record. You may also want to appoint someone to manage your financial affairs if you cannot. This is called a durable power of attorney for finances. It is a separate legal document from the durable power of attorney for health care. You may choose the same person or someone different from your health care proxy to act as your agent in financial matters.  This information is not intended to replace advice given to you by your health care provider. Make sure you discuss any questions you have with your health care provider.   Document Released: 10/15/2007 Document Revised: 07/13/2013 Document Reviewed: 11/25/2012 Elsevier Interactive Patient Education [employer].

## 2016-05-24 NOTE — Progress Notes (Signed)
reviewed

## 2016-06-11 ENCOUNTER — Encounter: Payer: Self-pay | Admitting: Family Medicine

## 2016-06-11 ENCOUNTER — Ambulatory Visit (HOSPITAL_BASED_OUTPATIENT_CLINIC_OR_DEPARTMENT_OTHER)
Admission: RE | Admit: 2016-06-11 | Discharge: 2016-06-11 | Disposition: A | Discharge status: Home / Self Care | Payer: Medicare Other | Source: Ambulatory Visit | Attending: Family Medicine | Admitting: Family Medicine

## 2016-06-11 ENCOUNTER — Ambulatory Visit (INDEPENDENT_AMBULATORY_CARE_PROVIDER_SITE_OTHER): Payer: Medicare Other | Admitting: Family Medicine

## 2016-06-11 VITALS — BP 142/70 | HR 68 | Temp 97.7°F | Resp 16 | Ht 72.0 in | Wt 178.6 lb

## 2016-06-11 DIAGNOSIS — M545 Low back pain: Secondary | ICD-10-CM | POA: Diagnosis present

## 2016-06-11 DIAGNOSIS — R29890 Loss of height: Secondary | ICD-10-CM | POA: Insufficient documentation

## 2016-06-11 DIAGNOSIS — R29898 Other symptoms and signs involving the musculoskeletal system: Secondary | ICD-10-CM | POA: Diagnosis not present

## 2016-06-11 DIAGNOSIS — R42 Dizziness and giddiness: Secondary | ICD-10-CM | POA: Diagnosis not present

## 2016-06-11 DIAGNOSIS — I700 Atherosclerosis of aorta: Secondary | ICD-10-CM | POA: Insufficient documentation

## 2016-06-11 DIAGNOSIS — Z23 Encounter for immunization: Secondary | ICD-10-CM | POA: Diagnosis not present

## 2016-06-11 DIAGNOSIS — M4317 Spondylolisthesis, lumbosacral region: Secondary | ICD-10-CM | POA: Diagnosis not present

## 2016-06-11 DIAGNOSIS — I708 Atherosclerosis of other arteries: Secondary | ICD-10-CM | POA: Diagnosis not present

## 2016-06-11 DIAGNOSIS — M85832 Other specified disorders of bone density and structure, left forearm: Secondary | ICD-10-CM | POA: Insufficient documentation

## 2016-06-11 DIAGNOSIS — M4186 Other forms of scoliosis, lumbar region: Secondary | ICD-10-CM | POA: Diagnosis not present

## 2016-06-11 DIAGNOSIS — M85852 Other specified disorders of bone density and structure, left thigh: Secondary | ICD-10-CM | POA: Insufficient documentation

## 2016-06-11 DIAGNOSIS — M1288 Other specific arthropathies, not elsewhere classified, other specified site: Secondary | ICD-10-CM | POA: Insufficient documentation

## 2016-06-11 DIAGNOSIS — E559 Vitamin D deficiency, unspecified: Secondary | ICD-10-CM | POA: Diagnosis not present

## 2016-06-11 HISTORY — DX: Loss of height: R29.890

## 2016-06-11 HISTORY — DX: Other symptoms and signs involving the musculoskeletal system: R29.898

## 2016-06-11 LAB — COMPREHENSIVE METABOLIC PANEL
ALK PHOS: 65 U/L (ref 39–117)
ALT: 27 U/L (ref 0–53)
AST: 22 U/L (ref 0–37)
Albumin: 4.5 g/dL (ref 3.5–5.2)
BUN: 15 mg/dL (ref 6–23)
CHLORIDE: 106 meq/L (ref 96–112)
CO2: 28 mEq/L (ref 19–32)
Calcium: 9.8 mg/dL (ref 8.4–10.5)
Creatinine, Ser: 0.98 mg/dL (ref 0.40–1.50)
GFR: 76.41 mL/min (ref >60.00)
GLUCOSE: 140 mg/dL — AB (ref 70–99)
POTASSIUM: 5 meq/L (ref 3.5–5.1)
SODIUM: 142 meq/L (ref 135–145)
TOTAL PROTEIN: 7.1 g/dL (ref 6.0–8.3)
Total Bilirubin: 0.8 mg/dL (ref 0.2–1.2)

## 2016-06-11 LAB — CBC WITH DIFFERENTIAL/PLATELET
BASOS PCT: 0.2 % (ref 0.0–3.0)
Basophils Absolute: 0 10*3/uL (ref 0.0–0.1)
EOS PCT: 1.5 % (ref 0.0–5.0)
Eosinophils Absolute: 0.1 10*3/uL (ref 0.0–0.7)
HCT: 47 % (ref 39.0–52.0)
Hemoglobin: 15.9 g/dL (ref 13.0–17.0)
LYMPHS ABS: 1.7 10*3/uL (ref 0.7–4.0)
Lymphocytes Relative: 23.4 % (ref 12.0–46.0)
MCHC: 33.9 g/dL (ref 30.0–36.0)
MCV: 91.3 fl (ref 78.0–100.0)
MONO ABS: 0.6 10*3/uL (ref 0.1–1.0)
MONOS PCT: 8.6 % (ref 3.0–12.0)
NEUTROS ABS: 4.7 10*3/uL (ref 1.4–7.7)
NEUTROS PCT: 66.3 % (ref 43.0–77.0)
Platelets: 210 10*3/uL (ref 150.0–400.0)
RBC: 5.14 Mil/uL (ref 4.22–5.81)
RDW: 12.8 % (ref 11.5–15.5)
WBC: 7.1 10*3/uL (ref 4.0–10.5)

## 2016-06-11 LAB — LIPID PANEL
CHOLESTEROL: 210 mg/dL — AB (ref 0–200)
HDL: 45.7 mg/dL (ref >39.00)
LDL Cholesterol: 127 mg/dL — ABNORMAL HIGH (ref 0–99)
NONHDL: 164.01
TRIGLYCERIDES: 186 mg/dL — AB (ref 0.0–149.0)
Total CHOL/HDL Ratio: 5
VLDL: 37.2 mg/dL (ref 0.0–40.0)

## 2016-06-11 LAB — POCT URINALYSIS DIPSTICK
Bilirubin, UA: NEGATIVE
Blood, UA: NEGATIVE
Glucose, UA: NEGATIVE
KETONES UA: NEGATIVE
LEUKOCYTES UA: NEGATIVE
NITRITE UA: NEGATIVE
PROTEIN UA: NEGATIVE
Spec Grav, UA: >=1.03
UROBILINOGEN UA: 0.2
pH, UA: 5.5

## 2016-06-11 LAB — PSA: PSA: 0.01 ng/mL — AB (ref 0.10–4.00)

## 2016-06-11 LAB — VITAMIN B12: VITAMIN B 12: 217 pg/mL (ref 211–911)

## 2016-06-11 LAB — TSH: TSH: 4.74 u[IU]/mL — AB (ref 0.35–4.50)

## 2016-06-11 NOTE — Assessment & Plan Note (Signed)
Check BMD

## 2016-06-11 NOTE — Assessment & Plan Note (Signed)
?   Etiology Check eeg + balance problems Refer to pt and neuro

## 2016-06-11 NOTE — Patient Instructions (Signed)
Deconditioning °Deconditioning refers to the changes in your body that occur during a period of inactivity. The changes happen in your heart, lungs, and muscles. They decrease your ability to be active, and they make you feel tired and weak. °There are three stages of deconditioning: °· Mild deconditioning. At this stage, you will notice a change in your ability to do your usual exercise activities, such as running, biking, or swimming. °· Moderate deconditioning. At this stage, you will notice a change in your ability to do normal everyday activities, such as walking, grocery shopping, and doing chores. °· Severe deconditioning. At this stage, you will notice a change in your ability to do minimal activity or normal self-care. °Deconditioning can occur after only a few days of inactivity. The longer the period of inactivity, the more severe the deconditioning will be, and the longer it will take to return to your previous level of functioning. °What are the causes? °Deconditioning is often caused by inactivity due to: °· Illnesses, such as cancer, stroke, heart attack, fibromyalgia, and chronic fatigue syndrome. °· Injuries, especially back injuries, broken bones, and ligament and tendon injuries. °· A long stay in the hospital. °· Pregnancy, especially if long periods of bed rest are needed. °What increases the risk? °This condition is more likely to develop in: °· People who are hospitalized. °· People on bed rest. °· People who are obese. °· People with poor nutrition. °· Elderly adults. °· People with injuries or illnesses that interfere with movement and activity. °What are the signs or symptoms? °Symptoms of deconditioning include: °· Weakness. °· Tiredness. °· Shortness of breath with minor exertion. °· A faster-than-normal heartbeat. You may not notice this without taking your pulse. °· Pain or discomfort with activity. °· Decreased strength. °· Decreased sense of balance. °· Decreased  endurance. °· Difficulty doing your usual forms of exercise. °· Difficulty doing activities of daily living, such as grocery shopping or chores. °· Difficulty walking around the house and doing basic self-care, such as getting to the bathroom, preparing meals, or doing laundry. °How is this diagnosed? °Deconditioning is diagnosed based on your medical history and a physical exam. During the physical exam, your health care provider will check for signs of deconditioning, such as: °· Decreased size of muscles. °· Decreased strength. °· Trouble with balance. °· Shortness of breath or abnormally increased heart rate after minor exertion. °How is this treated? °Treatment for deconditioning usually involves following a structured exercise program in which activity is increased gradually. Your health care provider will determine which exercises are right for you. The exercise program will likely include aerobic exercise and strength training: °· Aerobic exercise helps improve the functioning of the heart and lungs as well as the muscles. °· Strength training helps improve muscle size and strength. °Both of these types of exercise will improve your endurance. You may be referred to a physical therapist who can create a safe strengthening program for you to follow. °Follow these instructions at home: °· Follow the exercise program that is recommended by your health care provider or physical therapist. °· Do not increase your exercise any faster than directed. °· Eat a healthy diet. °· Do not use any products that contain nicotine or tobacco, such as cigarettes and e-cigarettes. If you need help quitting, ask your health care provider. °· Take over-the-counter and prescription medicines only as told by your health care provider. °· Keep all follow-up visits as told by your health care provider. This is important. °Contact a   health care provider if: °· You are not able to carry out the prescribed exercise program. °· You are  becoming more and more fatigued and weak. °· You become light-headed when rising to a sitting or standing position. °· Your level of endurance decreases after it has improved. °Get help right away if: °· You have chest pain. °· You are very short of breath. °· You have any episodes of passing out. °This information is not intended to replace advice given to you by your health care provider. Make sure you discuss any questions you have with your health care provider. °Document Released: 11/22/2013 Document Revised: 01/26/2016 Document Reviewed: 10/07/2015 °Elsevier Interactive Patient Education © 2017 [employer] ° °

## 2016-06-11 NOTE — Progress Notes (Signed)
Patient ID: Evan Wright, male    DOB: 26-Jan-1925  Age: 82 y.o. MRN: [id number]    Subjective:  Subjective  HPI  Evan Wright presents for f/u cholesterol and he c/o of sometimes feeling dizzy and difficulty getting up from floor or chair secondary to weakness in legs.  This has been going on for years but the weakness in the legs has gotten worse.    Review of Systems  Constitutional: Negative for appetite change, diaphoresis, fatigue and unexpected weight change.  Eyes: Negative for pain, redness and visual disturbance.  Respiratory: Negative for cough, chest tightness, shortness of breath and wheezing.   Cardiovascular: Negative for chest pain, palpitations and leg swelling.  Endocrine: Negative for cold intolerance, heat intolerance, polydipsia, polyphagia and polyuria.  Genitourinary: Negative for difficulty urinating, dysuria and frequency.  Musculoskeletal: Positive for arthralgias and back pain. Negative for myalgias.  Neurological: Positive for weakness. Negative for dizziness, light-headedness, numbness and headaches.    History Past Medical History:  Diagnosis Date  . Adenomatous colon polyp   . Diverticulosis   . Hyperlipidemia   . Osteoarthritis   . Prostate cancer [facility])     He has a past surgical history that includes Prostatectomy; Tonsillectomy; Eye surgery (2012); Appendectomy (1948); and Colonoscopy.   His family history includes Colitis in his mother; Colon cancer in his brother; Lung cancer in his father; Prostate cancer in his father.He reports that he has never smoked. He has never used smokeless tobacco. He reports that he does not drink alcohol or use drugs.  Current Outpatient Prescriptions on File Prior to Visit  Medication Sig Dispense Refill  . Calcium Carbonate (CALTRATE 600) 1500 MG TABS Take by mouth daily.      . Multiple Vitamins-Minerals (EYE VITAMINS) CAPS Take 1 capsule by mouth daily.    . Psyllium (METAMUCIL PO) Take by mouth daily.     No  current facility-administered medications on file prior to visit.      Objective:  Objective  Physical Exam  Constitutional: He is oriented to person, place, and time. Vital signs are normal. He appears well-developed and well-nourished. He is sleeping.  HENT:  Head: Normocephalic and atraumatic.  Mouth/Throat: Oropharynx is clear and moist.  Eyes: EOM are normal. Pupils are equal, round, and reactive to light.  Neck: Normal range of motion. Neck supple. No thyromegaly present.  Cardiovascular: Normal rate and regular rhythm.   Murmur heard. Pulmonary/Chest: Effort normal and breath sounds normal. No respiratory distress. He has no wheezes. He has no rales. He exhibits no tenderness.  Musculoskeletal: He exhibits no edema or tenderness.  Neurological: He is alert and oriented to person, place, and time.  Dec DTR b/l Weakness in both legs---- L >R   Skin: Skin is warm and dry.  Psychiatric: He has a normal mood and affect. His behavior is normal. Judgment and thought content normal.  Nursing note and vitals reviewed.  BP (!) 142/70 (BP Location: Left Arm, Patient Position: Sitting, Cuff Size: Normal)   Pulse 68   Temp 97.7 F (36.5 C) (Oral)   Resp 16   Ht 6' (1.829 m)   Wt 178 lb 9.6 oz (81 kg)   SpO2 98%   BMI 24.22 kg/m  Wt Readings from Last 3 Encounters:  06/11/16 178 lb 9.6 oz (81 kg)  05/24/16 178 lb 3.2 oz (80.8 kg)  09/27/15 175 lb 12.8 oz (79.7 kg)     Lab Results  Component Value Date   WBC 6.9  11/11/2013   HGB 16.3 11/11/2013   HCT 48.1 11/11/2013   PLT 214.0 11/11/2013   GLUCOSE 115 (H) 02/21/2015   CHOL 192 02/21/2015   TRIG 140.0 02/21/2015   HDL 44.60 02/21/2015   LDLDIRECT 165.3 10/08/2010   LDLCALC 119 (H) 02/21/2015   ALT 20 02/21/2015   AST 21 02/21/2015   NA 140 02/21/2015   K 4.2 02/21/2015   CL 106 02/21/2015   CREATININE 0.91 02/21/2015   BUN 17 02/21/2015   CO2 30 02/21/2015   TSH 5.05 02/11/2008   PSA 0.01 (L) 11/11/2013   HGBA1C  6.2 02/21/2015    No results found.   Assessment & Plan:  Plan  I am having Mr. Penman maintain his calcium carbonate, Psyllium (METAMUCIL PO), and EYE VITAMINS.  No orders of the defined types were placed in this encounter.   Problem List Items Addressed This Visit      Unprioritized   DIZZINESS    ? Etiology Check eeg + balance problems Refer to pt and neuro      Loss of height    Check BMD      Relevant Orders   DG Bone Density   Weakness of both lower extremities    Hx back problems Check xray Pt for re conditioning  Pt with problems getting up from chairs and stepping down off curb Balance is an issure as well Refer to neuro      Relevant Orders   Ambulatory referral to Physical Therapy   DG Lumbar Spine Complete   POCT urinalysis dipstick (Completed)   PSA   TSH   Vitamin B12   Vitamin D 1,25 dihydroxy   Lipid panel   CBC with Differential/Platelet   Comprehensive metabolic panel   Ambulatory referral to Neurology    Other Visit Diagnoses    Need for influenza vaccination    -  Primary   Relevant Orders   Flu vaccine HIGH DOSE PF (Completed)   Dizzy       Relevant Orders   Ambulatory referral to Physical Therapy   DG Lumbar Spine Complete   POCT urinalysis dipstick (Completed)   PSA   TSH   Vitamin B12   Vitamin D 1,25 dihydroxy   Lipid panel   CBC with Differential/Platelet   Comprehensive metabolic panel   Ambulatory referral to Neurology      Follow-up: Return in about 6 months (around 12/09/2016) for annual exam, fasting.  Evan Held, DO

## 2016-06-11 NOTE — Assessment & Plan Note (Signed)
Hx back problems Check xray Pt for re conditioning  Pt with problems getting up from chairs and stepping down off curb Balance is an issure as well Refer to neuro

## 2016-06-11 NOTE — Progress Notes (Signed)
Pre visit review using our clinic review tool, if applicable. No additional management support is needed unless otherwise documented below in the visit note. 

## 2016-06-12 NOTE — Progress Notes (Signed)
Lab results printed and mailed to pt.  PC

## 2016-06-14 LAB — VITAMIN D 1,25 DIHYDROXY
VITAMIN D 1, 25 (OH) TOTAL: 37 pg/mL (ref 18–72)
Vitamin D2 1, 25 (OH)2: <8 pg/mL
Vitamin D3 1, 25 (OH)2: 37 pg/mL

## 2016-06-25 ENCOUNTER — Telehealth: Payer: Self-pay

## 2016-06-25 NOTE — Telephone Encounter (Signed)
Would you please call    ----- Message -----  From: Franchot Gallo  Sent: 06/24/2016  1:42 PM  To: [name], DO  Subject: Referral                       Dr. Etter Sjogren,    I just spoke to Mr. Schrimsher and he would like a call from you before scheduling.    Have a nice day,    Otho Bellows patient to follow up.   Pt states he has an appt with neuro in Jan and would like to be seen by them before scheduling an appt with physical therapy.  He stated he was under the impression that Dr. Etter Sjogren wanted him to be seen by neuro first and then once results were back and Dr. Etter Sjogren deemed it necessary, be seen by physical therapy.   Please advise.

## 2016-06-29 NOTE — Telephone Encounter (Signed)
ok 

## 2016-07-03 ENCOUNTER — Telehealth: Payer: Self-pay | Admitting: Family Medicine

## 2016-07-03 NOTE — Telephone Encounter (Signed)
Patient would like a copy of the lab results from 06/11/16 sent to his address. Address of file is correct. Please advise

## 2016-07-04 NOTE — Telephone Encounter (Signed)
Printed and sent letter with results to pt, per pt's request. LB

## 2016-08-13 ENCOUNTER — Other Ambulatory Visit (INDEPENDENT_AMBULATORY_CARE_PROVIDER_SITE_OTHER): Payer: Medicare Other

## 2016-08-13 ENCOUNTER — Encounter: Payer: Self-pay | Admitting: Neurology

## 2016-08-13 ENCOUNTER — Ambulatory Visit (INDEPENDENT_AMBULATORY_CARE_PROVIDER_SITE_OTHER): Payer: Medicare Other | Admitting: Neurology

## 2016-08-13 VITALS — BP 144/76 | HR 76 | Ht 72.0 in | Wt 179.3 lb

## 2016-08-13 DIAGNOSIS — R29898 Other symptoms and signs involving the musculoskeletal system: Secondary | ICD-10-CM

## 2016-08-13 DIAGNOSIS — G629 Polyneuropathy, unspecified: Secondary | ICD-10-CM | POA: Diagnosis not present

## 2016-08-13 LAB — C-REACTIVE PROTEIN: CRP: 0.2 mg/dL — ABNORMAL LOW (ref 0.5–20.0)

## 2016-08-13 LAB — SEDIMENTATION RATE: SED RATE: 20 mm/h (ref 0–20)

## 2016-08-13 LAB — CK: Total CK: 178 U/L (ref 7–232)

## 2016-08-13 LAB — TSH: TSH: 5.51 u[IU]/mL — AB (ref 0.35–4.50)

## 2016-08-13 NOTE — Patient Instructions (Signed)
1. Schedule EMG/NCV of both lower extremities with Dr. Posey Pronto 2. Bloodwork for TSH, ESR, CRP, CK (creatine kinase), aldolase, lactate dehydrogenase 3. Refer to Physical Therapy for leg weakness and balance therapy 4. Follow-up after EMG

## 2016-08-13 NOTE — Progress Notes (Signed)
NEUROLOGY CONSULTATION NOTE  Evan Wright MRN: [id number] DOB: 09-May-1925  Referring provider: Dr. Lyndal Pulley Primary care provider: Dr. Lyndal Pulley  Reason for consult:  Leg weakness  Dear Dr Cheri Rous:  Thank you for your kind referral of Evan Wright for consultation of the above symptoms. Although his history is well known to you, please allow me to reiterate it for the purpose of our medical record. The patient was accompanied to the clinic by his wife and daughter who also provide collateral information. Records and images were personally reviewed where available.  HISTORY OF PRESENT ILLNESS: This is a pleasant 83 year old right-handed man with a history of diet-controlled hyperlipidemia, prostate cancer, presenting for evaluation of proximal leg weakness. He is also referred for headaches, but denies any headaches at this time. He states he has had trouble walking worse in the past year, getting out of a chair is difficult, he has to push with both hands, then gets his bearings before taking a step. He has difficulty stepping up curbs. He later states that around 4-5 years ago he had similar troubles getting out of chairs and was told he had arthritis in the lower spine. Around 5-6 years ago, he was going down steps and fell on his knees, and was told he had arthritis in his knees. Symptoms do not stop him form working in his garden, he continues to rake and mow the lawn. He feels tired pushing the Conservation officer, nature. He feels his problems are more due to balance rather than weakness. His family is concerned that he takes slow small steps, and sometimes he moves slightly and would fall to the floor. One time he was kicking toilet paper off the floor with his left foot then fell on the floor. He has to hold on to the rails when going down stairs. He has occasional numbness in both feet when in the same position for a prolonged period of time. He has noticed bending down makes him dizzy, such as  when he looks for his shoes. He has pain in his knees and around the thighs/calf areas. He reports arms are okay, they do not bother him. He denies any headaches, diplopia, dysarthria/dysphagia, neck pain, bowel/bladder dysfunction except for occasional constipation. His daughter later stated that when they eat out, he has some difficulty swallowing. He has a history of vertigo several years ago, which is different from the current dizziness. 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 No family history of similar symptoms.   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 There is scoliosis with multilevel arthropathy. Bone density scan showed osteopenia.  Laboratory Data: Lab Results  Component Value Date   WBC 7.1 06/11/2016   HGB 15.9 06/11/2016   HCT 47.0 06/11/2016   MCV 91.3 06/11/2016   PLT 210.0 06/11/2016  .[facility] Lab Results  Component Value Date   TSH 4.74 (H) 06/11/2016   Lab Results  Component Value Date   VITAMINB12 217 06/11/2016    PAST MEDICAL HISTORY: Past Medical History:  Diagnosis Date  . Adenomatous colon polyp   . Diverticulosis   . Hyperlipidemia   . Osteoarthritis   . Prostate cancer ([place])     PAST SURGICAL HISTORY: Past Surgical History:  Procedure Laterality Date  . APPENDECTOMY  1948  . COLONOSCOPY    . EYE SURGERY  2012  march and April  --Dr [name]  . PROSTATECTOMY    . TONSILLECTOMY      MEDICATIONS: Current Outpatient Prescriptions on File Prior to Visit  Medication Sig Dispense Refill  . Calcium Carbonate (CALTRATE 600) 1500 MG TABS Take by mouth daily.      . Multiple Vitamins-Minerals (EYE VITAMINS) CAPS Take 1 capsule by mouth daily.    . Psyllium (METAMUCIL PO) Take by mouth daily.     No current facility-administered medications on file prior to visit.     ALLERGIES: Allergies  Allergen Reactions  . Penicillins  Other (See Comments)    unknown    FAMILY HISTORY: Family History  Problem Relation Age of Onset  . Colon cancer Brother   . Lung cancer Father   . Prostate cancer Father   . Colitis Mother     SOCIAL HISTORY: Social History   Social History  . Marital status: Married    Spouse name: N/A  . Number of children: 2  . Years of education: N/A   Occupational History  . retired Retired   Social History Main Topics  . Smoking status: Never Smoker  . Smokeless tobacco: Never Used  . Alcohol use No  . Drug use: No  . Sexual activity: Not on file   Other Topics Concern  . Not on file   Social History Narrative  . No narrative on file    REVIEW OF SYSTEMS: Constitutional: No fevers, chills, or sweats, no generalized fatigue, change in appetite Eyes: No visual changes, double vision, eye pain Ear, nose and throat: No hearing loss, ear pain, nasal congestion, sore throat Cardiovascular: No chest pain, palpitations Respiratory:  No shortness of breath at rest or with exertion, wheezes GastrointestinaI: No nausea, vomiting, diarrhea, abdominal pain, fecal incontinence Genitourinary:  No dysuria, urinary retention or frequency Musculoskeletal:  No neck pain, + occasional back pain Integumentary: No rash, pruritus, skin lesions Neurological: as above Psychiatric: No depression, insomnia, anxiety Endocrine: No palpitations, fatigue, diaphoresis, mood swings, change in appetite, change in weight, increased thirst Hematologic/Lymphatic:  No anemia, purpura, petechiae. Allergic/Immunologic: no itchy/runny eyes, nasal congestion, recent allergic reactions, rashes  PHYSICAL EXAM: Vitals:   08/13/16 0855  BP: (!) 144/76  Pulse: 76   General: No acute distress, some hypophonia Head:  Normocephalic/atraumatic Eyes: Fundoscopic exam shows bilateral sharp discs, no vessel changes, exudates, or hemorrhages Neck: supple, no paraspinal tenderness, full range of motion Back: No  paraspinal tenderness Heart: regular rate and rhythm Lungs: Clear to auscultation bilaterally. Vascular: No carotid bruits. Skin/Extremities: No rash, no edema Neurological Exam: Mental status: alert and oriented to person, place, and time, no aphasia, slight dysarthria with pharyngeal sounds ("ka-ka"). Fund of knowledge is appropriate.  Recent and remote memory are intact.  Attention and concentration are normal.    Able to name objects and repeat phrases. Cranial nerves: CN I: not tested CN II: pupils equal, round and reactive to light, visual fields intact, fundi unremarkable. CN III, IV, VI:  full range of motion, no nystagmus, no ptosis CN V: facial sensation intact CN VII: upper and lower face symmetric CN VIII: hearing intact to finger rub CN IX, X: gag intact, uvula midline CN XI: sternocleidomastoid and trapezius muscles intact CN XII: tongue midline Bulk & Tone: normal, no cogwheeling, +fasciculations seen over both thighs and at times on the forearms Motor: 5/5 throughout except for 4/5 bilateral feet eversion, with no pronator drift. Decreased finger taps bilaterally, normal foot taps Sensation: intact to light touch,  cold, pin on both UE, decreased cold on both feet, intact pin, decreased vibration to knees bilaterally. Romberg test positive sway Deep Tendon Reflexes: unable to elicit reflexes even with facilitation, no ankle clonus Plantar responses: ?upgoing toe on left, downgoing on right Cerebellar: no incoordination on finger to nose, heel to shin. No dysdiadochokinesia Gait: slow and cautious, fair arm swing, mild difficulty with tandem walk but able.  Unable to stand without pushing with hands on chair.  Tremor: none  IMPRESSION: This is a pleasant 83 year old right-handed man with a history of diet-controlled hyperlipidemia, prostate cancer, presenting for evaluation of proximal leg weakness. His neurological exam shows pretty good muscle strength on individual muscle  testing except for possible mild weakness on foot eversion bilaterally. 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 We discussed that symptoms may be multifactorial from his spine, neuropathy, and knee issues. Myopathy is also considered with the proximal weakness reported. EMG/NCV of both lower extremities will be ordered to further evaluate his symptoms. Bloodwork for myopathy and neuropathy labs will be ordered. We discussed that he would benefit from physical therapy regardless of EMG results, he is agreeable to see them. He will follow-up after the tests.   Thank you for allowing me to participate in the care of this patient. Please do not hesitate to call for any questions or concerns.   Ellouise Newer, M.D.  CC: Dr. Cheri Rous

## 2016-08-15 LAB — LACTATE DEHYDROGENASE: LDH: 189 IU/L (ref 121–224)

## 2016-08-15 LAB — ALDOLASE: Aldolase: 5.5 U/L (ref 3.3–10.3)

## 2016-08-16 ENCOUNTER — Telehealth: Payer: Self-pay

## 2016-08-16 NOTE — Telephone Encounter (Signed)
-----   Message from Cameron Sprang, MD sent at 08/15/2016  1:55 PM EST ----- Pls let patient know that the bloodwork came back good except for the thyroid, it is actually higher than before. Pls have him contact Dr. Etter Sjogren, we will send results to her as well. Thanks

## 2016-08-16 NOTE — Telephone Encounter (Signed)
Patient notified. He states he will contact Dr. Etter Sjogren about thyroid level.

## 2016-08-20 ENCOUNTER — Ambulatory Visit (INDEPENDENT_AMBULATORY_CARE_PROVIDER_SITE_OTHER): Payer: Medicare Other | Admitting: Neurology

## 2016-08-20 DIAGNOSIS — R29898 Other symptoms and signs involving the musculoskeletal system: Secondary | ICD-10-CM

## 2016-08-20 DIAGNOSIS — G629 Polyneuropathy, unspecified: Secondary | ICD-10-CM

## 2016-08-20 NOTE — Procedures (Signed)
[facility] Neurology  [employer], [address]  [place], [place] [address] Tel: [phone] Fax:  [phone] Test Date:  08/20/2016  Patient: Evan Wright DOB: 05/04/1925 Physician: Narda Amber, DO  Sex: Male Height: 6' " Ref Phys: Ellouise Newer, MD  ID#: [id number] Temp: 33.6C Technician: Jerilynn Mages. Dean   Patient Complaints: This is a 83 year old gentleman referred for evaluation of bilateral leg weakness and gait instability.   NCV & EMG Findings: Extensive electrodiagnostic testing of the left lower extremity and additional studies of the right shows:  1. Bilateral sural and superficial peroneal sensory responses are absent. 2. Bilateral peroneal (EDB) and tibial motor responses are absent. Bilateral peroneal motor responses recording at the tibialis anterior is within normal limits. 3. Bilateral tibial H reflexes studies are prolonged. 4. Chronic motor axon loss changes are seen affecting all the tested muscles of the lower extremity (L3-S1 myotomes) and conform to a gradient pattern, with most severe changes distally.   Impression: The electrophysiologic findings are most consistent with a chronic sensorimotor polyneuropathy, axon loss in type, affecting the lower extremities. Overall, these findings are severe in degree electrically. A multilevel lumbosacral radiculopathy affecting the L3-S1 nerve roots cannot be excluded.   ___________________________ Narda Amber, DO    Nerve Conduction Studies Anti Sensory Summary Table   Site NR Peak (ms) Norm Peak (ms) P-T Amp (V) Norm P-T Amp  Left Sup Peroneal Anti Sensory (Ant Lat Mall)  33.6C  12 cm NR  <4.6  >3  Right Sup Peroneal Anti Sensory (Ant Lat Mall)  12 cm NR  <4.6  >3  Left Sural Anti Sensory (Lat Mall)  33.6C  Calf NR  <4.6  >3  Right Sural Anti Sensory (Lat Mall)  Calf NR  <4.6  >3   Motor Summary Table   Site NR Onset (ms) Norm Onset (ms) O-P Amp (mV) Norm O-P Amp Site1 Site2 Delta-0 (ms) Dist (cm) Vel (m/s)  Norm Vel (m/s)  Left Peroneal Motor (Ext Dig Brev)  33.6C  Ankle NR  <6.0  >2.5 B Fib Ankle  0.0  >40  B Fib NR     Poplt B Fib  0.0  >40  Poplt NR            Right Peroneal Motor (Ext Dig Brev)  Ankle NR  <6.0  >2.5 B Fib Ankle  0.0  >40  B Fib NR     Poplt B Fib  0.0  >40  Poplt NR            Left Peroneal TA Motor (Tib Ant)  33.6C  Fib Head    3.6 <4.5 3.0 >3 Poplit Fib Head 1.6 10.0 63 >40  Poplit    5.2  2.1         Right Peroneal TA Motor (Tib Ant)  Fib Head    3.9 <4.5 3.3 >3 Poplit Fib Head 1.7 10.0 59 >40  Poplit    5.6  3.1         Left Tibial Motor (Abd Hall Brev)  33.6C  Ankle NR  <6.0  >4 Knee Ankle  0.0  >40  Knee NR            Right Tibial Motor (Abd Hall Brev)  Ankle NR  <6.0  >4 Knee Ankle  0.0  >40  Knee NR             H Reflex Studies   NR H-Lat (ms) Lat Norm (ms) L-R H-Lat (ms)  M-Lat (ms) HLat-MLat (ms)  Left Tibial (Gastroc)  33.6C     37.96 <35 0.41 5.85 32.11  Right Tibial (Gastroc)     37.55 <35 0.41 5.85 31.70   EMG   Side Muscle Ins Act Fibs Psw Fasc Number Recrt Dur Dur. Amp Amp. Poly Poly. Comment  Left AntTibialis Nml Nml Nml Nml 2- Rapid Many 1+ Many 1+ Some 1+ N/A  Left Gastroc Nml Nml Nml Nml 2- Rapid Many 1+ Many 1+ Some 1+ N/A  Left Flex Dig Long Nml Nml Nml Nml SMU Rapid All 1+ All 1+ All 1+ N/A  Left RectFemoris Nml Nml Nml Nml 1- Rapid Some 1+ Some 1+ Some 1+ N/A  Left GluteusMed Nml Nml Nml Nml 1- Rapid Some 1+ Some 1+ Some 1+ N/A  Left BicepsFemS Nml Nml Nml Nml 1- Rapid Some 1+ Some 1+ Some 1+ N/A  Left AdductorLong Nml Nml Nml Nml 1- Rapid Some 1+ Some 1+ Some 1+ N/A  Right AntTibialis Nml Nml Nml Nml 2- Rapid Many 1+ Many 1+ Many 1+ N/A  Right Gastroc Nml Nml Nml Nml 2- Rapid Many 1+ Many 1+ Many 1+ N/A  Right Flex Dig Long Nml Nml Nml Nml SMU Rapid All 1+ All 1+ All 1+ N/A  Right RectFemoris Nml Nml Nml Nml 1- Rapid Some 1+ Some 1+ Some 1+ N/A  Right GluteusMed Nml Nml Nml Nml 1- Rapid Few 1+ Some 1+ Some 1+ N/A       Waveforms:

## 2016-08-28 ENCOUNTER — Telehealth: Payer: Self-pay | Admitting: Neurology

## 2016-08-28 NOTE — Telephone Encounter (Signed)
Called re: EMG results. Left VM, asked to call to leave best time and number to call him back.

## 2016-08-29 ENCOUNTER — Telehealth: Payer: Self-pay | Admitting: Family Medicine

## 2016-08-29 NOTE — Telephone Encounter (Signed)
Patient called requesting lab results from 07/2016. Please advise   Phone: [phone]

## 2016-08-29 NOTE — Telephone Encounter (Signed)
Can you please review labs from Dr. Delice Lesch?

## 2016-08-29 NOTE — Telephone Encounter (Signed)
If dr Evan Wright ordered them she will go over them with the patient

## 2016-08-30 NOTE — Telephone Encounter (Signed)
Hi Dr. Etter Sjogren, the patient is talking about his TSH, it was a little high when you saw him. He said you were going to re-check it and asked if I can check it with the other bloodwork. It was higher, so I asked him to contact you about it. Sorry for confusion, thanks  -Santiago Glad

## 2016-08-30 NOTE — Telephone Encounter (Signed)
Pt will need to start synthroid 25 mcg 1 po qd #30  2 refills --- recheck TSH in 2 months

## 2016-08-30 NOTE — Telephone Encounter (Signed)
ahhh-- ok-- thanks!   yvonne

## 2016-09-03 ENCOUNTER — Other Ambulatory Visit: Payer: Self-pay | Admitting: Family Medicine

## 2016-09-03 DIAGNOSIS — R7989 Other specified abnormal findings of blood chemistry: Secondary | ICD-10-CM

## 2016-09-03 MED ORDER — LEVOTHYROXINE SODIUM 25 MCG PO TABS: 25.0000 ug | ORAL_TABLET | Freq: Every day | ORAL | 3 refills | Status: DC

## 2016-09-03 NOTE — Telephone Encounter (Signed)
Left message on machine to call back  

## 2016-09-03 NOTE — Telephone Encounter (Signed)
Spoke to this patient Informed of TSH/sent in prescription/scheduled lab and put in order to TSH. The patient verbally understood/agreed to all instructions.

## 2016-09-03 NOTE — Telephone Encounter (Signed)
Called left message to call back 

## 2016-10-11 ENCOUNTER — Ambulatory Visit (INDEPENDENT_AMBULATORY_CARE_PROVIDER_SITE_OTHER): Payer: Medicare Other | Admitting: Neurology

## 2016-10-11 ENCOUNTER — Encounter: Payer: Self-pay | Admitting: Neurology

## 2016-10-11 VITALS — BP 140/78 | HR 65 | Temp 97.7°F | Resp 16 | Ht 69.5 in | Wt 178.5 lb

## 2016-10-11 DIAGNOSIS — G609 Hereditary and idiopathic neuropathy, unspecified: Secondary | ICD-10-CM

## 2016-10-11 DIAGNOSIS — R29898 Other symptoms and signs involving the musculoskeletal system: Secondary | ICD-10-CM | POA: Diagnosis not present

## 2016-10-11 HISTORY — DX: Other symptoms and signs involving the musculoskeletal system: R29.898

## 2016-10-11 HISTORY — DX: Hereditary and idiopathic neuropathy, unspecified: G60.9

## 2016-10-11 NOTE — Progress Notes (Signed)
NEUROLOGY FOLLOW UP OFFICE NOTE  Evan Wright [id number]  HISTORY OF PRESENT ILLNESS: I had the pleasure of seeing Evan Wright in follow-up in the neurology clinic on 10/11/2016.  The patient was last seen 2 months ago for proximal leg weakness and is again accompanied by his wife and daughter who help supplement the history today.  Records and images were personally reviewed where available. Bloodwork for neuropathy and myopathy was done, ESR, CRP, CK, aldolase, and lactate dehydrogenase were normal. His TSH was elevated at 5.51, he has been started by his PCP on Synthroid. He had an EMG/NCV of both legs done, showing chronic sensorimotor polyneuropathy, axon loss in type, affecting the lower extremities. Overall, these findings are severe in degree electrically. A multilevel lumbosacral radiculopathy affecting the L3-S1 nerve roots cannot be excluded. He has not done PT yet due to weather issues, his wife states he says he is "too busy." He denies any falls since his last visit. He still has difficulty standing up from chairs and needs the rail going up stairs. He still works in the garden and carries heavy loads and feels unbalanced. After working for an hour, he feels very fatigued and has to rest. He denies any leg or feet pain. He has occasional stabbing pain in the back when lifting. He denies any paresthesias in his legs. He feels his hands are strong but his shoulders are not as strong. He denies any headaches, diplopia, dysarthria/dysphagia, neck pain, bowel/bladder dysfunction.   HPI 08/28/2016: This is a pleasant 83 yo RH man with a history of diet-controlled hyperlipidemia, prostate cancer, who presented for evaluation of proximal leg weakness. He was also referred for headaches, but denied any headaches on initial evaluation. He states he has had trouble walking worse in the past year, getting out of a chair is difficult, he has to push with both hands, then gets his bearings before taking  a step. He has difficulty stepping up curbs. He later states that around 4-5 years ago he had similar troubles getting out of chairs and was told he had arthritis in the lower spine. Around 5-6 years ago, he was going down steps and fell on his knees, and was told he had arthritis in his knees. Symptoms do not stop him form working in his garden, he continues to rake and mow the lawn. He feels tired pushing the Conservation officer, nature. He feels his problems are more due to balance rather than weakness. His family is concerned that he takes slow small steps, and sometimes he moves slightly and would fall to the floor. One time he was kicking toilet paper off the floor with his left foot then fell on the floor. He has to hold on to the rails when going down stairs. He has occasional numbness in both feet when in the same position for a prolonged period of time. He has noticed bending down makes him dizzy, such as when he looks for his shoes. He has pain in his knees and around the thighs/calf areas. He reports arms are okay, they do not bother him. He denies any   except for occasional constipation. His daughter later stated that when they eat out, he has some difficulty swallowing. He has a history of vertigo several years ago, which is different from the current dizziness. 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 No family history of similar symptoms.   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 There is scoliosis with multilevel arthropathy. Bone density scan showed osteopenia.  PAST MEDICAL HISTORY: Past Medical History:  Diagnosis Date  . Adenomatous colon polyp   . Diverticulosis   . Hyperlipidemia   . Osteoarthritis   . Prostate cancer [facility])     MEDICATIONS: Current Outpatient Prescriptions on File Prior to Visit  Medication Sig Dispense Refill  . Calcium Carbonate (CALTRATE 600) 1500 MG  TABS Take by mouth daily.      [name] levothyroxine (SYNTHROID, LEVOTHROID) 25 MCG tablet Take 1 tablet (25 mcg total) by mouth daily before breakfast. 30 tablet 3  . Multiple Vitamins-Minerals (EYE VITAMINS) CAPS Take 1 capsule by mouth daily.    . Psyllium (METAMUCIL PO) Take by mouth daily.     No current facility-administered medications on file prior to visit.     ALLERGIES: Allergies  Allergen Reactions  . Penicillins Other (See Comments)    unknown    FAMILY HISTORY: Family History  Problem Relation Age of Onset  . Colon cancer Brother   . Lung cancer Father   . Prostate cancer Father   . Colitis Mother     SOCIAL HISTORY: Social History   Social History  . Marital status: Married    Spouse name: N/A  . Number of children: 2  . Years of education: N/A   Occupational History  . retired Retired   Social History Main Topics  . Smoking status: Never Smoker  . Smokeless tobacco: Never Used  . Alcohol use No  . Drug use: No  . Sexual activity: Not on file   Other Topics Concern  . Not on file   Social History Narrative  . No narrative on file    REVIEW OF SYSTEMS: Constitutional: No fevers, chills, or sweats, no generalized fatigue, change in appetite Eyes: No visual changes, double vision, eye pain Ear, nose and throat: No hearing loss, ear pain, nasal congestion, sore throat Cardiovascular: No chest pain, palpitations Respiratory:  No shortness of breath at rest or with exertion, wheezes GastrointestinaI: No nausea, vomiting, diarrhea, abdominal pain, fecal incontinence Genitourinary:  No dysuria, urinary retention or frequency Musculoskeletal:  No neck pain, + occasional back pain Integumentary: No rash, pruritus, skin lesions Neurological: as above Psychiatric: No depression, insomnia, anxiety Endocrine: No palpitations, fatigue, diaphoresis, mood swings, change in appetite, change in weight, increased thirst Hematologic/Lymphatic:  No anemia, purpura,  petechiae. Allergic/Immunologic: no itchy/runny eyes, nasal congestion, recent allergic reactions, rashes  PHYSICAL EXAM: Vitals:   10/11/16 0941  BP: 140/78  Pulse: 65  Resp: 16  Temp: 97.7 F (36.5 C)   General: No acute distress, some hypophonia (similar to prior) Head:  Normocephalic/atraumatic Neck: supple, no paraspinal tenderness, full range of motion Back: No paraspinal tenderness Heart: regular rate and rhythm Lungs: Clear to auscultation bilaterally. Vascular: No carotid bruits. Skin/Extremities: No rash, no edema Neurological Exam: Mental status: alert and oriented to person, place, and time, no aphasia, slight dysarthria. Fund of knowledge is appropriate.  Recent and remote memory are intact.  Attention and concentration are normal.    Able to name objects and repeat phrases. Cranial nerves: CN I: not tested CN II: pupils equal, round and reactive to light, visual fields intact CN III, IV, VI:  full range of motion, no nystagmus, no ptosis CN V: facial sensation intact CN VII: upper and lower face symmetric CN VIII: hearing intact to finger rub CN IX, X: gag  intact, uvula midline CN XI: sternocleidomastoid and trapezius muscles intact CN XII: tongue midline Bulk & Tone: normal, no cogwheeling, + occasional fasciculations seen at times on the forearms (similar to prior) Motor: 5/5 throughout except for 4/5 bilateral feet eversion, with no pronator drift. Sensation: intact decreased in both LE. Romberg test positive sway Deep Tendon Reflexes: unable to elicit reflexes even with facilitation, no ankle clonus Cerebellar: no incoordination on finger to nose testing Gait: slow small steps, fair arm swing, mild difficulty with tandem walk but able.  Unable to stand without pushing with hands on chair.  Tremor: none  IMPRESSION: This is a pleasant 83 yo RH man with a history of diet-controlled hyperlipidemia, prostate cancer, who presented for evaluation of proximal leg  weakness. Exam showed pretty good individual muscle testing, there is evidence of a length-dependent neuropathy and fasciculations. Bloodwork was overall unremarkable except for elevated TSH, which could cause myopathy and neuropathy, he is now on Synthroid. His EMG/NCV of both lower extremities did not show any evidence of myopathy, however there was evidence of chronic sensorimotor polyneuropathy, severe in degree. A multilevel lumbosacral radiculopathy affecting the L3-S1 nerve roots cannot be excluded. We discussed the option of doing lumbar imaging, however it is very likely he has degenerative disease, would recommend doing PT first. If symptoms continue, lumbar MRI will be ordered and we may consider neurosurgery consult if indicated.  He is hesitant to do PT, I strongly recommended this several times during the visit. He will follow-up in 6 months and knows to call for any changes.   Thank you for allowing me to participate in his care.  Please do not hesitate to call for any questions or concerns.  The duration of this appointment visit was 25 minutes of face-to-face time with the patient.  Greater than 50% of this time was spent in counseling, explanation of diagnosis, planning of further management, and coordination of care.   Ellouise Newer, M.D.   CC: Dr. Cheri Rous

## 2016-10-11 NOTE — Patient Instructions (Signed)
1. Strongly recommend Physical Therapy 2. Do not lift heavy objects 3. Continue thyroid medication as prescribed by Dr. Etter Sjogren 4. Follow-up in 6 months, call for any changes

## 2016-10-22 ENCOUNTER — Telehealth: Payer: Self-pay | Admitting: Family Medicine

## 2016-10-22 NOTE — Telephone Encounter (Signed)
ok 

## 2016-10-22 NOTE — Telephone Encounter (Signed)
Caller name:[facility]  Relationship to patient: Can be [phone] Pharmacy:  Reason for call:Need updated referral for PT, lower weakness

## 2016-10-22 NOTE — Telephone Encounter (Signed)
The patients referral expired and they need an updated one for him to do PT.  Advise if ok. Thanks,

## 2016-10-24 ENCOUNTER — Other Ambulatory Visit: Payer: Self-pay | Admitting: Family Medicine

## 2016-10-24 DIAGNOSIS — R29898 Other symptoms and signs involving the musculoskeletal system: Secondary | ICD-10-CM

## 2016-10-24 NOTE — Telephone Encounter (Signed)
Referred for PT.

## 2016-10-29 ENCOUNTER — Ambulatory Visit: Payer: Medicare Other | Attending: Family Medicine | Admitting: Physical Therapy

## 2016-10-29 DIAGNOSIS — M6281 Muscle weakness (generalized): Secondary | ICD-10-CM | POA: Diagnosis not present

## 2016-10-29 DIAGNOSIS — R2689 Other abnormalities of gait and mobility: Secondary | ICD-10-CM | POA: Diagnosis not present

## 2016-10-29 DIAGNOSIS — R293 Abnormal posture: Secondary | ICD-10-CM | POA: Insufficient documentation

## 2016-10-29 DIAGNOSIS — R2681 Unsteadiness on feet: Secondary | ICD-10-CM | POA: Diagnosis not present

## 2016-10-29 NOTE — Therapy (Signed)
[facility] [address]  [place], [place], [address] Phone: [phone]   Fax:  [phone]  Physical Therapy Evaluation  Patient Details  Name: Evan Wright MRN: [id number] Date of Birth: 11/23/1924 Referring Provider: Dr. Roma Schanz  Encounter Date: 10/29/2016      PT End of Session - 10/29/16 1230    Visit Number 1   Number of Visits 16   Date for PT Re-Evaluation 12/24/16   Authorization Type Medicare   PT Start Time 1017   PT Stop Time 1115   PT Time Calculation (min) 58 min   Activity Tolerance Patient tolerated treatment well   Behavior During Therapy [facility] for tasks assessed/performed      Past Medical History:  Diagnosis Date  . Adenomatous colon polyp   . Diverticulosis   . Hyperlipidemia   . Osteoarthritis   . Prostate cancer [facility])     Past Surgical History:  Procedure Laterality Date  . APPENDECTOMY  1948  . COLONOSCOPY    . EYE SURGERY  2012   march and April  --Dr [name]  . PROSTATECTOMY    . TONSILLECTOMY      There were no vitals filed for this visit.       Subjective Assessment - 10/29/16 1151    Subjective Patient with wife today. Reports a few month history of his balance becoming worse. Has been seen by neurology with NCV/EMG testing done with results of neuropathy of B LE. Patient reports most difficulty with sit to stand transfers, standing for prolonged periods of time, as well as initiating gait without first standing to ensure good balance. Has had 1 fall in the past 6 months with difficulty getting up. Denies any hip/knee pain, but does state he has general leg soreness.    Patient is accompained by: Family member   Pertinent History osteoarthritis, neuropathy   Limitations Standing;Walking   Diagnostic tests NCV/EMG: chronic sensorimotor polyneuropathy   Patient Stated Goals improve balance, reduce fall risk   Currently in Pain? No/denies   Multiple Pain Sites  No            [facility] PT Assessment - 10/29/16 1034      Assessment   Medical Diagnosis Weakness of lower extremity   Referring Provider Dr. Roma Schanz   Next MD Visit 11/01/16   Prior Therapy no     Precautions   Precautions None     Restrictions   Weight Bearing Restrictions No     Balance Screen   Has the patient fallen in the past 6 months Yes   How many times? 1   Has the patient had a decrease in activity level because of a fear of falling?  No   Is the patient reluctant to leave their home because of a fear of falling?  No     Home Environment   Living Environment Private residence   Living Arrangements Spouse/significant other   Type of [place] to enter   Entrance Stairs-Number of Steps 3   Entrance Stairs-Rails Right   [place] One level   [place] bars - tub/shower     Prior Function   Level of Independence Independent   Vocation Retired   Leisure gardening, yard work     Cognition   Overall Cognitive Status Within [employer] for tasks assessed     Observation/Other Assessments   Focus on Therapeutic  Outcomes (FOTO)  Neuro: 55 (45% limited, predicted 39% limited)     Sensation   Light Touch Appears Intact     Coordination   Gross Motor Movements are Fluid and Coordinated Yes     Posture/Postural Control   Posture/Postural Control Postural limitations   Postural Limitations Rounded Shoulders;Forward head;Increased thoracic kyphosis     ROM / Strength   AROM / PROM / Strength AROM;Strength     AROM   Overall AROM  Within functional limits for tasks performed   Overall AROM Comments B LE     Strength   Strength Assessment Site Hip;Knee;Ankle   Right/Left Hip Right;Left   Right Hip Flexion 4-/5   Left Hip Flexion 4/5   Right/Left Knee Right;Left   Right Knee Flexion 4-/5   Right Knee Extension 4/5   Left Knee Flexion 4-/5   Left Knee Extension 4/5   Right/Left Ankle Right;Left   Right  Ankle Dorsiflexion 3+/5   Left Ankle Dorsiflexion 3+/5     Transfers   Comments increased time for all trasnfer. Definite need of hands for all transfers     Ambulation/Gait   Ambulation/Gait Yes   Ambulation/Gait Assistance 6: Modified independent (Device/Increase time)   Ambulation Distance (Feet) 150 Feet   Assistive device None   Gait Pattern Decreased stride length;Trunk flexed   Ambulation Surface Level;Indoor   Gait velocity 1.95 feet/second     Standardized Balance Assessment   Standardized Balance Assessment Berg Balance Test;Timed Up and Go Test;Five Times Sit to Stand   Five times sit to stand comments  35.34     Berg Balance Test   Sit to Stand Able to stand  independently using hands   Standing Unsupported Able to stand 2 minutes with supervision   Sitting with Back Unsupported but Feet Supported on Floor or Stool Able to sit safely and securely 2 minutes   Stand to Sit Controls descent by using hands   Transfers Able to transfer safely, definite need of hands   Standing Unsupported with Eyes Closed Able to stand 10 seconds with supervision   Standing Ubsupported with Feet Together Able to place feet together independently and stand for 1 minute with supervision   From Standing, Reach Forward with Outstretched Arm Loses balance while trying/requires external support   From Standing Position, Pick up Object from Floor Able to pick up shoe, needs supervision   From Standing Position, Turn to Look Behind Over each Shoulder Turn sideways only but maintains balance   Turn 360 Degrees Able to turn 360 degrees safely but slowly   Standing Unsupported, Alternately Place Feet on Step/Stool Needs assistance to keep from falling or unable to try   Standing Unsupported, One Foot in [employer] balance while stepping or standing   Standing on One Leg Tries to lift leg/unable to hold 3 seconds but remains standing independently   Total Score 30     Timed Up and Go Test   Normal  TUG (seconds) 26.16                           PT Education - 10/29/16 1229    Education provided Yes   Education Details exam findings, POC. Much time spent discussing results of balance assessments and plan of treatment   Person(s) Educated Patient;Spouse   Methods Explanation   Comprehension Verbalized understanding;Returned demonstration;Need further instruction          PT Short Term Goals -  10/29/16 1239      PT SHORT TERM GOAL #1   Title Patient to be independent with initial HEP of OTAGO program (11/26/16)   Status New           PT Long Term Goals - 10/29/16 1239      PT LONG TERM GOAL #1   Title Patient to be independent with advanced HEP (12/24/16)   Status New     PT LONG TERM GOAL #2   Title Patient to improve gait velocity to >/= 2.62 feet/second demonstrating improved functional mobility (12/24/16)   Status New     PT LONG TERM GOAL #3   Title Patient to improve Berg Balance to >/= 45/56 demonstrating improved balance with reduced fall risk (12/24/16)   Status New     PT LONG TERM GOAL #4   Title Patient to improve TUG to </= 20 seconds demonstrating improved functional mobility (12/24/16)   Status New     PT LONG TERM GOAL #5   Title Patient to demonstrate ability to ambulate > 500 feet on various indoor and outdoor surfaces with no LOB or falls (12/24/16)   Status New     Additional Long Term Goals   Additional Long Term Goals Yes     PT LONG TERM GOAL #6   Title Patient to improve gross B LE strength to >/= 4+/5 (12/24/16)   Status New               Plan - 10/29/16 1234    Clinical Impression Statement Mr. Irizarry is a 83 y/o male presenting to [facility] today for initial evaluation regarding primary complaints of reduced balance with a history of falls. Patient today ambulating without AD, with short step length, with forward flexed trunk. Per patient report, patient has had NCV/EMG studies completed with findings of chronic sensorimotor  polyneuropathy. Patient with some strength deficits as seen with MMT as well as during trasnfers with definite need for use of hands as sometimes with multiple attempts to achieve upright standing posture. All balance assessments revealing high fall risk for patient with most difficulty during alternating stepping as well as during narrow base of support. Patient to benefit from skilled PT to address trength and balance deficits to improve functional mobility with reduced fall risk.    Rehab Potential Good   PT Frequency 2x / week   PT Duration 8 weeks   PT Treatment/Interventions ADLs/Self Care Home Management;Cryotherapy;Electrical Stimulation;Moist Heat;Ultrasound;Neuromuscular re-education;Balance training;Therapeutic exercise;Therapeutic activities;Functional mobility training;Stair training;Gait training;DME Instruction;Patient/family education;Manual techniques;Vasopneumatic Device;Taping   PT Next Visit Plan OTAGO   Consulted and Agree with Plan of Care Patient      Patient will benefit from skilled therapeutic intervention in order to improve the following deficits and impairments:  Decreased balance, Decreased mobility, Decreased strength, Difficulty walking  Visit Diagnosis: Unsteadiness on feet - Plan: PT plan of care cert/re-cert  Other abnormalities of gait and mobility - Plan: PT plan of care cert/re-cert  Muscle weakness (generalized) - Plan: PT plan of care cert/re-cert  Abnormal posture - Plan: PT plan of care cert/re-cert      G-Codes - 50/09/38 1243    Functional Assessment Tool Used (Outpatient Only) FOTO: 45% limited   Functional Limitation Mobility: Walking and moving around   Mobility: Walking and Moving Around Current Status (H8299) At least 40 percent but less than 60 percent impaired, limited or restricted   Mobility: Walking and Moving Around Goal Status (B7169) At least 20 percent but less than  40 percent impaired, limited or restricted       Problem  List Patient Active Problem List   Diagnosis Date Noted  . Idiopathic peripheral neuropathy 10/11/2016  . Proximal leg weakness 10/11/2016  . Weakness of both lower extremities 06/11/2016  . Loss of height 06/11/2016  . Hyperlipidemia 05/18/2012  . De Quervain's tenosynovitis 05/18/2012  . Family history of malignant neoplasm of gastrointestinal tract 05/14/2011  . NEVI, MULTIPLE 07/12/2010  . OSTEOARTHRITIS 07/12/2010  . UNSPECIFIED VITAMIN D DEFICIENCY 02/11/2008  . HIATAL HERNIA WITH REFLUX 02/11/2008  . CARDIAC MURMUR 02/11/2008  . GASTROENTERITIS 01/20/2008  . PERSONAL HISTORY OF COLONIC POLYPS 11/17/2007  . DIZZINESS 10/15/2007  . ELEVATED BLOOD PRESSURE WITHOUT DIAGNOSIS OF HYPERTENSION 10/15/2007  . PROSTATE CANCER, HX OF 10/15/2007    Lanney Gins, PT, DPT 10/29/16 1:15 PM   [facility] [address]  [place], [place], [address] Phone: [phone]   Fax:  [phone]  Name: Evan Wright MRN: [id number] Date of Birth: 02-23-1925

## 2016-11-01 ENCOUNTER — Other Ambulatory Visit (INDEPENDENT_AMBULATORY_CARE_PROVIDER_SITE_OTHER): Payer: Medicare Other

## 2016-11-01 DIAGNOSIS — R946 Abnormal results of thyroid function studies: Secondary | ICD-10-CM

## 2016-11-01 DIAGNOSIS — R7989 Other specified abnormal findings of blood chemistry: Secondary | ICD-10-CM

## 2016-11-01 LAB — TSH: TSH: 5.05 u[IU]/mL — ABNORMAL HIGH (ref 0.35–4.50)

## 2016-11-04 ENCOUNTER — Ambulatory Visit: Payer: Medicare Other | Admitting: Physical Therapy

## 2016-11-04 ENCOUNTER — Other Ambulatory Visit: Payer: Self-pay | Admitting: Family Medicine

## 2016-11-04 DIAGNOSIS — R2681 Unsteadiness on feet: Secondary | ICD-10-CM | POA: Diagnosis not present

## 2016-11-04 DIAGNOSIS — R2689 Other abnormalities of gait and mobility: Secondary | ICD-10-CM

## 2016-11-04 DIAGNOSIS — R293 Abnormal posture: Secondary | ICD-10-CM

## 2016-11-04 DIAGNOSIS — M6281 Muscle weakness (generalized): Secondary | ICD-10-CM

## 2016-11-04 DIAGNOSIS — E079 Disorder of thyroid, unspecified: Secondary | ICD-10-CM

## 2016-11-04 MED ORDER — LEVOTHYROXINE SODIUM 50 MCG PO TABS: 50.0000 ug | ORAL_TABLET | Freq: Every day | ORAL | 1 refills | Status: DC

## 2016-11-04 NOTE — Therapy (Signed)
[facility] [address]  [place], [place], [address] Phone: [phone]   Fax:  [phone]  Physical Therapy Treatment  Patient Details  Name: Evan Wright MRN: [id number] Date of Birth: [birth year] Referring Provider: Dr. Roma Schanz  Encounter Date: 11/04/2016      PT End of Session - 11/04/16 1104    Visit Number 2   Number of Visits 16   Date for PT Re-Evaluation 12/24/16   Authorization Type Medicare   PT Start Time 1102   PT Stop Time 1145   PT Time Calculation (min) 43 min   Activity Tolerance Patient tolerated treatment well   Behavior During Therapy [facility] for tasks assessed/performed      Past Medical History:  Diagnosis Date  . Adenomatous colon polyp   . Diverticulosis   . Hyperlipidemia   . Osteoarthritis   . Prostate cancer [facility])     Past Surgical History:  Procedure Laterality Date  . APPENDECTOMY  1948  . COLONOSCOPY    . EYE SURGERY  2012   march and April  --Dr [name]  . PROSTATECTOMY    . TONSILLECTOMY      There were no vitals filed for this visit.      Subjective Assessment - 11/04/16 1104    Subjective feels about the same with balance - still feels like it takes some time standing before he can walk.   Pertinent History osteoarthritis, neuropathy   Diagnostic tests NCV/EMG: chronic sensorimotor polyneuropathy   Patient Stated Goals improve balance, reduce fall risk   Currently in Pain? No/denies   Multiple Pain Sites No                         [facility] Adult PT Treatment/Exercise - 11/04/16 0001      Exercises   Exercises Knee/Hip     Knee/Hip Exercises: Aerobic   Nustep L4 x 5 minutes             Balance Exercises - 11/04/16 1149      OTAGO PROGRAM   Head Movements Standing;5 reps   Neck Movements Standing;5 reps   Back Extension Standing;5 reps   Trunk Movements Standing;5 reps   Ankle Movements Sitting;10 reps   Knee Extensor 10  reps   Knee Flexor 10 reps   Hip ABductor 10 reps   Ankle Plantorflexors 20 reps, support   Ankle Dorsiflexors 20 reps, support   Knee Bends 10 reps, support   Backwards Walking Support   Walking and Turning Around No assistive device   Sideways Walking No assistive device   Tandem Stance 10 seconds, support   Tandem Walk Support   One Leg Stand 10 seconds, support   Heel Walking Support   Toe Walk Support   Sit to Stand 10 reps, bilateral support             PT Short Term Goals - 11/04/16 1151      PT SHORT TERM GOAL #1   Title Patient to be independent with initial HEP of OTAGO program (11/26/16)   Status On-going           PT Long Term Goals - 11/04/16 1151      PT LONG TERM GOAL #1   Title Patient to be independent with advanced HEP (12/24/16)   Status On-going     PT LONG TERM GOAL #2   Title Patient to improve gait velocity  to >/= 2.62 feet/second demonstrating improved functional mobility (12/24/16)   Status On-going     PT LONG TERM GOAL #3   Title Patient to improve Berg Balance to >/= 45/56 demonstrating improved balance with reduced fall risk (12/24/16)   Status On-going     PT LONG TERM GOAL #4   Title Patient to improve TUG to </= 20 seconds demonstrating improved functional mobility (12/24/16)   Status On-going     PT LONG TERM GOAL #5   Title Patient to demonstrate ability to ambulate > 500 feet on various indoor and outdoor surfaces with no LOB or falls (12/24/16)   Status On-going     PT LONG TERM GOAL #6   Title Patient to improve gross B LE strength to >/= 4+/5 (12/24/16)   Status On-going               Plan - 11/04/16 1105    Clinical Impression Statement Patient doing well today - introduction and completion of OTAGO today with good ability. Does require UE support during standing and balance tassk to reduce fall risk, as well as heavy VC and TC for proper form of movement. Next session to focus on incorporation of stretching and  strengthening of B LE - with determination if patient needs to increase frequency to 2x/week depending on carryover at home. Patient and wife both in agreeance to this.    PT Treatment/Interventions ADLs/Self Care Home Management;Cryotherapy;Electrical Stimulation;Moist Heat;Ultrasound;Neuromuscular re-education;Balance training;Therapeutic exercise;Therapeutic activities;Functional mobility training;Stair training;Gait training;DME Instruction;Patient/family education;Manual techniques;Vasopneumatic Device;Taping   PT Next Visit Plan stretching (NO manual, only what patient is able to do independently), strengthening   Consulted and Agree with Plan of Care Patient      Patient will benefit from skilled therapeutic intervention in order to improve the following deficits and impairments:  Decreased balance, Decreased mobility, Decreased strength, Difficulty walking  Visit Diagnosis: Unsteadiness on feet  Other abnormalities of gait and mobility  Muscle weakness (generalized)  Abnormal posture     Problem List Patient Active Problem List   Diagnosis Date Noted  . Idiopathic peripheral neuropathy 10/11/2016  . Proximal leg weakness 10/11/2016  . Weakness of both lower extremities 06/11/2016  . Loss of height 06/11/2016  . Hyperlipidemia 05/18/2012  . De Quervain's tenosynovitis 05/18/2012  . Family history of malignant neoplasm of gastrointestinal tract 05/14/2011  . NEVI, MULTIPLE 07/12/2010  . OSTEOARTHRITIS 07/12/2010  . UNSPECIFIED VITAMIN D DEFICIENCY 02/11/2008  . HIATAL HERNIA WITH REFLUX 02/11/2008  . CARDIAC MURMUR 02/11/2008  . GASTROENTERITIS 01/20/2008  . PERSONAL HISTORY OF COLONIC POLYPS 11/17/2007  . DIZZINESS 10/15/2007  . ELEVATED BLOOD PRESSURE WITHOUT DIAGNOSIS OF HYPERTENSION 10/15/2007  . PROSTATE CANCER, HX OF 10/15/2007     Lanney Gins, PT, DPT 11/04/16 11:55 AM    [facility] [address]  [place], [place], [address] Phone: [phone]   Fax:  [phone]  Name: CAMILO MANDER MRN: [id number] Date of Birth: 1925/05/04

## 2016-11-11 ENCOUNTER — Ambulatory Visit: Payer: Medicare Other

## 2016-11-11 DIAGNOSIS — R2681 Unsteadiness on feet: Secondary | ICD-10-CM | POA: Diagnosis not present

## 2016-11-11 DIAGNOSIS — R2689 Other abnormalities of gait and mobility: Secondary | ICD-10-CM

## 2016-11-11 DIAGNOSIS — R293 Abnormal posture: Secondary | ICD-10-CM

## 2016-11-11 DIAGNOSIS — M6281 Muscle weakness (generalized): Secondary | ICD-10-CM

## 2016-11-11 NOTE — Therapy (Signed)
[facility] [address]  [place], [place], [address] Phone: [phone]   Fax:  [phone]  Physical Therapy Treatment  Patient Details  Name: Evan Wright MRN: [id number] Date of Birth: 27-Aug-1924 Referring Provider: Dr. Roma Schanz  Encounter Date: 11/11/2016      PT End of Session - 11/11/16 1328    Visit Number 3   Number of Visits 16   Date for PT Re-Evaluation 12/24/16   Authorization Type Medicare   PT Start Time 1315   PT Stop Time 1405   PT Time Calculation (min) 50 min   Activity Tolerance Patient tolerated treatment well   Behavior During Therapy [facility] for tasks assessed/performed      Past Medical History:  Diagnosis Date  . Adenomatous colon polyp   . Diverticulosis   . Hyperlipidemia   . Osteoarthritis   . Prostate cancer [facility])     Past Surgical History:  Procedure Laterality Date  . APPENDECTOMY  1948  . COLONOSCOPY    . EYE SURGERY  2012   march and April  --Dr [name]  . PROSTATECTOMY    . TONSILLECTOMY      There were no vitals filed for this visit.      Subjective Assessment - 11/11/16 1320    Subjective Pt. reporting he has been working in his yard a lot the last few days.     Patient Stated Goals improve balance, reduce fall risk   Currently in Pain? No/denies   Multiple Pain Sites No                         [facility] Adult PT Treatment/Exercise - 11/11/16 1338      Self-Care   Self-Care Other Self-Care Comments   Other Self-Care Comments  Discussion of importance for consistent performance of OTAGO balance program at home      Neuro Re-ed    Neuro Re-ed Details  at counter with UE support; tandem walk, side-stepping with yellow TB x 3 laps down/back each     Knee/Hip Exercises: Stretches   Passive Hamstring Stretch Right;Left;3 reps;30 seconds   Passive Hamstring Stretch Limitations Pt. able to perform without cueing for last rep on each    Piriformis Stretch Right;Left;1 rep;30 seconds   Piriformis Stretch Limitations with strap; figure-4; supine     Knee/Hip Exercises: Aerobic   Nustep L4 x 5 minutes     Knee/Hip Exercises: Seated   Long Arc Quad AROM;Right;Left;1 set;10 reps   Long Arc Quad Weight 2 lbs.   Long [employer] Limitations with adduction ball squeeze   Hamstring Curl AROM;1 set;Right;Left;15 reps   Hamstring Limitations red TB    Sit to Sand 5 reps;with UE support;3 sets  from mat table with airex pad; B UE pushoff                 PT Education - 11/11/16 1429    Education provided Yes   Education Details HS stretch with strap   Person(s) Educated Patient   Methods Explanation;Demonstration;Verbal cues;Handout   Comprehension Verbalized understanding;Returned demonstration;Verbal cues required;Need further instruction          PT Short Term Goals - 11/04/16 1151      PT SHORT TERM GOAL #1   Title Patient to be independent with initial HEP of OTAGO program (11/26/16)   Status On-going           PT  Long Term Goals - 11/04/16 1151      PT LONG TERM GOAL #1   Title Patient to be independent with advanced HEP (12/24/16)   Status On-going     PT LONG TERM GOAL #2   Title Patient to improve gait velocity to >/= 2.62 feet/second demonstrating improved functional mobility (12/24/16)   Status On-going     PT LONG TERM GOAL #3   Title Patient to improve Berg Balance to >/= 45/56 demonstrating improved balance with reduced fall risk (12/24/16)   Status On-going     PT LONG TERM GOAL #4   Title Patient to improve TUG to </= 20 seconds demonstrating improved functional mobility (12/24/16)   Status On-going     PT LONG TERM GOAL #5   Title Patient to demonstrate ability to ambulate > 500 feet on various indoor and outdoor surfaces with no LOB or falls (12/24/16)   Status On-going     PT LONG TERM GOAL #6   Title Patient to improve gross B LE strength to >/= 4+/5 (12/24/16)   Status On-going                Plan - 11/11/16 1422    Clinical Impression Statement Mr. Bond doing well today reporting he has been doing some of the OTAGO balance HEP.  Today's treatment focusing on LE strengthening to improve ability to rise from low surfaces, and LE flexibility for performance at home with HEP.  Pt. able to self-demo HS stretch with min cueing in treatment today thus added to HEP.  Significant time spent discussing need for consistent adherence to balance HEP today.  Pt. and wife verbalizing understanding of need for HEP performance to end treatment today.   PT Treatment/Interventions ADLs/Self Care Home Management;Cryotherapy;Electrical Stimulation;Moist Heat;Ultrasound;Neuromuscular re-education;Balance training;Therapeutic exercise;Therapeutic activities;Functional mobility training;Stair training;Gait training;DME Instruction;Patient/family education;Manual techniques;Vasopneumatic Device;Taping   PT Next Visit Plan Monitor adherence to OTAGO program; strengthening; balance training      Patient will benefit from skilled therapeutic intervention in order to improve the following deficits and impairments:  Decreased balance, Decreased mobility, Decreased strength, Difficulty walking  Visit Diagnosis: Unsteadiness on feet  Other abnormalities of gait and mobility  Muscle weakness (generalized)  Abnormal posture     Problem List Patient Active Problem List   Diagnosis Date Noted  . Idiopathic peripheral neuropathy 10/11/2016  . Proximal leg weakness 10/11/2016  . Weakness of both lower extremities 06/11/2016  . Loss of height 06/11/2016  . Hyperlipidemia 05/18/2012  . De Quervain's tenosynovitis 05/18/2012  . Family history of malignant neoplasm of gastrointestinal tract 05/14/2011  . NEVI, MULTIPLE 07/12/2010  . OSTEOARTHRITIS 07/12/2010  . UNSPECIFIED VITAMIN D DEFICIENCY 02/11/2008  . HIATAL HERNIA WITH REFLUX 02/11/2008  . CARDIAC MURMUR 02/11/2008  .  GASTROENTERITIS 01/20/2008  . PERSONAL HISTORY OF COLONIC POLYPS 11/17/2007  . DIZZINESS 10/15/2007  . ELEVATED BLOOD PRESSURE WITHOUT DIAGNOSIS OF HYPERTENSION 10/15/2007  . PROSTATE CANCER, HX OF 10/15/2007    Bess Harvest, PTA 11/11/16 2:37 PM   [facility] [address]  [place], [place], [address] Phone: [phone]   Fax:  [phone]  Name: Evan Wright MRN: [id number] Date of Birth: [birth year]

## 2016-11-18 ENCOUNTER — Ambulatory Visit: Payer: Medicare Other | Admitting: Physical Therapy

## 2016-11-18 DIAGNOSIS — M6281 Muscle weakness (generalized): Secondary | ICD-10-CM

## 2016-11-18 DIAGNOSIS — R293 Abnormal posture: Secondary | ICD-10-CM | POA: Diagnosis not present

## 2016-11-18 DIAGNOSIS — R2681 Unsteadiness on feet: Secondary | ICD-10-CM

## 2016-11-18 DIAGNOSIS — R2689 Other abnormalities of gait and mobility: Secondary | ICD-10-CM | POA: Diagnosis not present

## 2016-11-18 NOTE — Therapy (Signed)
[facility] [address]  [place], [place], [address] Phone: [phone]   Fax:  [phone]  Physical Therapy Treatment  Patient Details  Name: Evan Wright MRN: [id number] Date of Birth: 1924-10-22 Referring Provider: Dr. Roma Schanz  Encounter Date: 11/18/2016      PT End of Session - 11/18/16 1256    Visit Number 4   Number of Visits 16   Date for PT Re-Evaluation 12/24/16   Authorization Type Medicare   PT Start Time 1019   PT Stop Time 1100   PT Time Calculation (min) 41 min   Activity Tolerance Patient tolerated treatment well   Behavior During Therapy [facility] for tasks assessed/performed      Past Medical History:  Diagnosis Date  . Adenomatous colon polyp   . Diverticulosis   . Hyperlipidemia   . Osteoarthritis   . Prostate cancer [facility])     Past Surgical History:  Procedure Laterality Date  . APPENDECTOMY  1948  . COLONOSCOPY    . EYE SURGERY  2012   march and April  --Dr [name]  . PROSTATECTOMY    . TONSILLECTOMY      There were no vitals filed for this visit.      Subjective Assessment - 11/18/16 1256    Subjective Doing well at home - biggest complaint is balance with going up/down curbs   Pertinent History osteoarthritis, neuropathy   Diagnostic tests NCV/EMG: chronic sensorimotor polyneuropathy   Patient Stated Goals improve balance, reduce fall risk   Currently in Pain? No/denies   Multiple Pain Sites No                         [facility] Adult PT Treatment/Exercise - 11/18/16 0001      Neuro Re-ed    Neuro Re-ed Details  sit to stand between chairs - some reps with stepping over low foam roll, some with weaving in and out of cones; stanidng on AirEx with reaching and tossing bags 10 x each side; navigating environment to reach bend for cones;      Knee/Hip Exercises: Aerobic   Nustep L6 x 6 minutes                  PT Short Term Goals - 11/04/16  1151      PT SHORT TERM GOAL #1   Title Patient to be independent with initial HEP of OTAGO program (11/26/16)   Status On-going           PT Long Term Goals - 11/04/16 1151      PT LONG TERM GOAL #1   Title Patient to be independent with advanced HEP (12/24/16)   Status On-going     PT LONG TERM GOAL #2   Title Patient to improve gait velocity to >/= 2.62 feet/second demonstrating improved functional mobility (12/24/16)   Status On-going     PT LONG TERM GOAL #3   Title Patient to improve Berg Balance to >/= 45/56 demonstrating improved balance with reduced fall risk (12/24/16)   Status On-going     PT LONG TERM GOAL #4   Title Patient to improve TUG to </= 20 seconds demonstrating improved functional mobility (12/24/16)   Status On-going     PT LONG TERM GOAL #5   Title Patient to demonstrate ability to ambulate > 500 feet on various indoor and outdoor surfaces with no LOB or falls (12/24/16)  Status On-going     PT LONG TERM GOAL #6   Title Patient to improve gross B LE strength to >/= 4+/5 (12/24/16)   Status On-going               Plan - 11/18/16 1256    Clinical Impression Statement Patient doing well today - heavy focus on balance during session as well as for general mobility within the home and community. Good progress with sit to stand with good progression of improving intial gait after transfer as well as simulating step up/down curb with 4" aerobic step. Will plan to take patient to actual curb at next visit to work on this area of deficit more efficiently.    PT Treatment/Interventions ADLs/Self Care Home Management;Cryotherapy;Electrical Stimulation;Moist Heat;Ultrasound;Neuromuscular re-education;Balance training;Therapeutic exercise;Therapeutic activities;Functional mobility training;Stair training;Gait training;DME Instruction;Patient/family education;Manual techniques;Vasopneumatic Device;Taping   PT Next Visit Plan Monitor adherence to OTAGO program;  strengthening; balance training   Consulted and Agree with Plan of Care Patient      Patient will benefit from skilled therapeutic intervention in order to improve the following deficits and impairments:  Decreased balance, Decreased mobility, Decreased strength, Difficulty walking  Visit Diagnosis: Unsteadiness on feet  Other abnormalities of gait and mobility  Muscle weakness (generalized)  Abnormal posture     Problem List Patient Active Problem List   Diagnosis Date Noted  . Idiopathic peripheral neuropathy 10/11/2016  . Proximal leg weakness 10/11/2016  . Weakness of both lower extremities 06/11/2016  . Loss of height 06/11/2016  . Hyperlipidemia 05/18/2012  . De Quervain's tenosynovitis 05/18/2012  . Family history of malignant neoplasm of gastrointestinal tract 05/14/2011  . NEVI, MULTIPLE 07/12/2010  . OSTEOARTHRITIS 07/12/2010  . UNSPECIFIED VITAMIN D DEFICIENCY 02/11/2008  . HIATAL HERNIA WITH REFLUX 02/11/2008  . CARDIAC MURMUR 02/11/2008  . GASTROENTERITIS 01/20/2008  . PERSONAL HISTORY OF COLONIC POLYPS 11/17/2007  . DIZZINESS 10/15/2007  . ELEVATED BLOOD PRESSURE WITHOUT DIAGNOSIS OF HYPERTENSION 10/15/2007  . PROSTATE CANCER, HX OF 10/15/2007     Lanney Gins, PT, DPT 11/18/16 1:00 PM   [facility] [address]  [address] [place], [place], [address] Phone: [phone]   Fax:  [phone]  Name: Evan Wright MRN: [id number] Date of Birth: [birth year]

## 2016-11-25 ENCOUNTER — Ambulatory Visit: Payer: Medicare Other | Attending: Family Medicine

## 2016-11-25 DIAGNOSIS — R2681 Unsteadiness on feet: Secondary | ICD-10-CM | POA: Diagnosis not present

## 2016-11-25 DIAGNOSIS — M6281 Muscle weakness (generalized): Secondary | ICD-10-CM | POA: Diagnosis not present

## 2016-11-25 DIAGNOSIS — R293 Abnormal posture: Secondary | ICD-10-CM | POA: Insufficient documentation

## 2016-11-25 DIAGNOSIS — R2689 Other abnormalities of gait and mobility: Secondary | ICD-10-CM | POA: Diagnosis not present

## 2016-11-25 NOTE — Therapy (Signed)
[facility] [address]  [place], [place], [address] Phone: [phone]   Fax:  [phone]  Physical Therapy Treatment  Patient Details  Name: Evan Wright MRN: [id number] Date of Birth: 1924-12-10 Referring Provider: Dr. Roma Schanz  Encounter Date: 11/25/2016      PT End of Session - 11/25/16 1306    Visit Number 5   Number of Visits 16   Date for PT Re-Evaluation 12/24/16   Authorization Type Medicare   PT Start Time 0973   PT Stop Time 1100   PT Time Calculation (min) 45 min   Activity Tolerance Patient tolerated treatment well   Behavior During Therapy [facility] for tasks assessed/performed      Past Medical History:  Diagnosis Date  . Adenomatous colon polyp   . Diverticulosis   . Hyperlipidemia   . Osteoarthritis   . Prostate cancer [facility])     Past Surgical History:  Procedure Laterality Date  . APPENDECTOMY  1948  . COLONOSCOPY    . EYE SURGERY  2012   march and April  --Dr [name]  . PROSTATECTOMY    . TONSILLECTOMY      There were no vitals filed for this visit.      Subjective Assessment - 11/25/16 1016    Subjective Pt. reporting fall in yard while digging with shortened handled shovel. Pt. reporting his legs felt weak before he fell.  Pt. reporting he fell backwards in front yard landing on, "back and butt".  Pt. reporting he scooted to poll in yard at which point wife helped him to his feet.  Pt. reporting he did not hurt following this fall and did not stike his head upon falling.     Patient Stated Goals improve balance, reduce fall risk   Currently in Pain? No/denies   Multiple Pain Sites No                         [facility] Adult PT Treatment/Exercise - 11/25/16 1224      Ambulation/Gait   Curb 5: Supervision   Gait Comments Pt. able to ascend/descend 6" curb requiring supervision x 2 each way; pt. very instable requiring greately increased time to descend due to  anxiety of falling; practice ascending/descending sideways and forwards      Neuro Re-ed    Neuro Re-ed Details  Alternating toe touch to 6" step without UE support 2 x 10 reps each side; close supervision from therapist with occasional CGA; narrow stance and B staggered stance with head turns x 30 sec each way      Knee/Hip Exercises: Aerobic   Nustep L6 x 6 minutes     Knee/Hip Exercises: Standing   Hip ADduction --   Hip ADduction Limitations --   Hip Abduction Stengthening;Right;Left;1 set;15 reps;Knee straight   Abduction Limitations holding onto treadmill    Hip Extension Stengthening;Right;Left;1 set;15 reps;Knee straight  cueing for posture   Extension Limitations holding onto treadmill      Knee/Hip Exercises: Seated   Long Arc Quad AROM;Right;Left;1 set;15 reps   Long Arc Quad Weight 3 lbs.   Long [employer] Limitations with adduction ball squeeze   Hamstring Curl AROM;1 set;Right;Left;15 reps   Hamstring Limitations red TB    Sit to Sand 5 reps;with UE support  4 sets; from airex pad  PT Short Term Goals - 11/04/16 1151      PT SHORT TERM GOAL #1   Title Patient to be independent with initial HEP of OTAGO program (11/26/16)   Status On-going           PT Long Term Goals - 11/04/16 1151      PT LONG TERM GOAL #1   Title Patient to be independent with advanced HEP (12/24/16)   Status On-going     PT LONG TERM GOAL #2   Title Patient to improve gait velocity to >/= 2.62 feet/second demonstrating improved functional mobility (12/24/16)   Status On-going     PT LONG TERM GOAL #3   Title Patient to improve Berg Balance to >/= 45/56 demonstrating improved balance with reduced fall risk (12/24/16)   Status On-going     PT LONG TERM GOAL #4   Title Patient to improve TUG to </= 20 seconds demonstrating improved functional mobility (12/24/16)   Status On-going     PT LONG TERM GOAL #5   Title Patient to demonstrate ability to ambulate > 500  feet on various indoor and outdoor surfaces with no LOB or falls (12/24/16)   Status On-going     PT LONG TERM GOAL #6   Title Patient to improve gross B LE strength to >/= 4+/5 (12/24/16)   Status On-going               Plan - 11/25/16 1101    Clinical Impression Statement Pt. reporting fall in front yard while gardening using short handled shovel yesterday.  Pt. fell backwards landing on buttocks and back and reports he did not strike his head.  Pt. scooted on buttocks to nearby pole and wife helped him to his feet.  Wife reporting today pt. did not have dizziness, blurred vision, or unusual symptoms following fall.  PT made aware of fall today.  Pt. performed well in treatment today pain free.  Treatment focusing on balance training with stepping and head turn activities in narrow and staggered stance.  Gait training with navigation of outdoor curb today with pt. very unsteady requiring increase time to navigate with supervision.  Strength training focusing on improved muscle groups required to stance from low surfaces.  Pt. ending treatment noting LE fatigue.    PT Treatment/Interventions ADLs/Self Care Home Management;Cryotherapy;Electrical Stimulation;Moist Heat;Ultrasound;Neuromuscular re-education;Balance training;Therapeutic exercise;Therapeutic activities;Functional mobility training;Stair training;Gait training;DME Instruction;Patient/family education;Manual techniques;Vasopneumatic Device;Taping   PT Next Visit Plan Balance training; strengthening       Patient will benefit from skilled therapeutic intervention in order to improve the following deficits and impairments:  Decreased balance, Decreased mobility, Decreased strength, Difficulty walking  Visit Diagnosis: Unsteadiness on feet  Other abnormalities of gait and mobility  Muscle weakness (generalized)  Abnormal posture     Problem List Patient Active Problem List   Diagnosis Date Noted  . Idiopathic peripheral  neuropathy 10/11/2016  . Proximal leg weakness 10/11/2016  . Weakness of both lower extremities 06/11/2016  . Loss of height 06/11/2016  . Hyperlipidemia 05/18/2012  . De Quervain's tenosynovitis 05/18/2012  . Family history of malignant neoplasm of gastrointestinal tract 05/14/2011  . NEVI, MULTIPLE 07/12/2010  . OSTEOARTHRITIS 07/12/2010  . UNSPECIFIED VITAMIN D DEFICIENCY 02/11/2008  . HIATAL HERNIA WITH REFLUX 02/11/2008  . CARDIAC MURMUR 02/11/2008  . GASTROENTERITIS 01/20/2008  . PERSONAL HISTORY OF COLONIC POLYPS 11/17/2007  . DIZZINESS 10/15/2007  . ELEVATED BLOOD PRESSURE WITHOUT DIAGNOSIS OF HYPERTENSION 10/15/2007  . PROSTATE CANCER, HX OF 10/15/2007  Bess Harvest, PTA 11/25/16 1:10 PM  [facility] [address]  [place], [place], [address] Phone: [phone]   Fax:  [phone]  Name: Evan Wright MRN: [id number] Date of Birth: 08-05-1924

## 2016-12-02 ENCOUNTER — Ambulatory Visit: Payer: Medicare Other

## 2016-12-02 DIAGNOSIS — R293 Abnormal posture: Secondary | ICD-10-CM

## 2016-12-02 DIAGNOSIS — R2689 Other abnormalities of gait and mobility: Secondary | ICD-10-CM

## 2016-12-02 DIAGNOSIS — M6281 Muscle weakness (generalized): Secondary | ICD-10-CM | POA: Diagnosis not present

## 2016-12-02 DIAGNOSIS — R2681 Unsteadiness on feet: Secondary | ICD-10-CM | POA: Diagnosis not present

## 2016-12-02 NOTE — Therapy (Signed)
[facility] [address]  [place], [place], [address] Phone: [phone]   Fax:  [phone]  Physical Therapy Treatment  Patient Details  Name: Evan Wright MRN: [id number] Date of Birth: January 08, 1925 Referring Provider: Dr. Roma Schanz  Encounter Date: 12/02/2016      PT End of Session - 12/02/16 1027    Visit Number 6   Number of Visits 16   Date for PT Re-Evaluation 12/24/16   Authorization Type Medicare   PT Start Time 1021  pt. arrived late    PT Stop Time 1100   PT Time Calculation (min) 39 min   Activity Tolerance Patient tolerated treatment well   Behavior During Therapy [facility] for tasks assessed/performed      Past Medical History:  Diagnosis Date  . Adenomatous colon polyp   . Diverticulosis   . Hyperlipidemia   . Osteoarthritis   . Prostate cancer [facility])     Past Surgical History:  Procedure Laterality Date  . APPENDECTOMY  1948  . COLONOSCOPY    . EYE SURGERY  2012   march and April  --Dr [name]  . PROSTATECTOMY    . TONSILLECTOMY      There were no vitals filed for this visit.      Subjective Assessment - 12/02/16 1026    Subjective Pt. reporting some fatigue today due to going to park yesterday for mothers day gathering.     Patient Stated Goals improve balance, reduce fall risk   Currently in Pain? No/denies   Multiple Pain Sites No                         [facility] Adult PT Treatment/Exercise - 12/02/16 1038      Neuro Re-ed    Neuro Re-ed Details  Alternating toe-touch to 8" step with UE support x 10 reps each; Alternating cross-over toe-touch to rubber ball on 6" step x 10 reps each side; 1 HH support; Alternating step up onto 8" step with cross-over reach to cone on bolster x 5 reps each side      Knee/Hip Exercises: Aerobic   Nustep L6 x 6 minutes     Knee/Hip Exercises: Seated   Long Arc Quad Right;Left;15 reps;2 sets   Long Arc Quad Weight 3 lbs.   Long [employer] Limitations with adduction ball squeeze   Hamstring Curl 1 set;Right;Left;15 reps   Hamstring Limitations green TB   Sit to Sand 3 sets;5 reps;with UE support  chair with airex pad; 1 UE pushoff                  PT Short Term Goals - 11/04/16 1151      PT SHORT TERM GOAL #1   Title Patient to be independent with initial HEP of OTAGO program (11/26/16)   Status On-going           PT Long Term Goals - 11/04/16 1151      PT LONG TERM GOAL #1   Title Patient to be independent with advanced HEP (12/24/16)   Status On-going     PT LONG TERM GOAL #2   Title Patient to improve gait velocity to >/= 2.62 feet/second demonstrating improved functional mobility (12/24/16)   Status On-going     PT LONG TERM GOAL #3   Title Patient to improve Berg Balance to >/= 45/56 demonstrating improved balance with reduced fall risk (12/24/16)   Status  On-going     PT LONG TERM GOAL #4   Title Patient to improve TUG to </= 20 seconds demonstrating improved functional mobility (12/24/16)   Status On-going     PT LONG TERM GOAL #5   Title Patient to demonstrate ability to ambulate > 500 feet on various indoor and outdoor surfaces with no LOB or falls (12/24/16)   Status On-going     PT LONG TERM GOAL #6   Title Patient to improve gross B LE strength to >/= 4+/5 (12/24/16)   Status On-going               Plan - 12/02/16 1028    Clinical Impression Statement Pt. doing well today.  Balance activity today with stepping, and reaching activities focusing on improving control with SLS.  Pt. still requiring UE support with balance activity however seems to be improving with sit<>stand strengthening activities.  Reports performing, '"some" OTAGO balance HEP activities at home.  Complaint of some dizziness and "spinning" during standing therex today throughout treatment.  Pt. to see PT next visit and will plan to screen for vestibular involvement.   PT Treatment/Interventions ADLs/Self  Care Home Management;Cryotherapy;Electrical Stimulation;Moist Heat;Ultrasound;Neuromuscular re-education;Balance training;Therapeutic exercise;Therapeutic activities;Functional mobility training;Stair training;Gait training;DME Instruction;Patient/family education;Manual techniques;Vasopneumatic Device;Taping   PT Next Visit Plan Testing for potential vestibular involvement. Balance training; strengthening       Patient will benefit from skilled therapeutic intervention in order to improve the following deficits and impairments:  Decreased balance, Decreased mobility, Decreased strength, Difficulty walking  Visit Diagnosis: Unsteadiness on feet  Other abnormalities of gait and mobility  Muscle weakness (generalized)  Abnormal posture     Problem List Patient Active Problem List   Diagnosis Date Noted  . Idiopathic peripheral neuropathy 10/11/2016  . Proximal leg weakness 10/11/2016  . Weakness of both lower extremities 06/11/2016  . Loss of height 06/11/2016  . Hyperlipidemia 05/18/2012  . De Quervain's tenosynovitis 05/18/2012  . Family history of malignant neoplasm of gastrointestinal tract 05/14/2011  . NEVI, MULTIPLE 07/12/2010  . OSTEOARTHRITIS 07/12/2010  . UNSPECIFIED VITAMIN D DEFICIENCY 02/11/2008  . HIATAL HERNIA WITH REFLUX 02/11/2008  . CARDIAC MURMUR 02/11/2008  . GASTROENTERITIS 01/20/2008  . PERSONAL HISTORY OF COLONIC POLYPS 11/17/2007  . DIZZINESS 10/15/2007  . ELEVATED BLOOD PRESSURE WITHOUT DIAGNOSIS OF HYPERTENSION 10/15/2007  . PROSTATE CANCER, HX OF 10/15/2007    Bess Harvest, PTA 12/02/16 12:22 PM  [facility] [address]  [place], [place], [address] Phone: [phone]   Fax:  [phone]  Name: Evan Wright MRN: [id number] Date of Birth: Oct 25, 1924

## 2016-12-09 ENCOUNTER — Ambulatory Visit: Payer: Medicare Other | Admitting: Physical Therapy

## 2016-12-09 DIAGNOSIS — R2689 Other abnormalities of gait and mobility: Secondary | ICD-10-CM

## 2016-12-09 DIAGNOSIS — R2681 Unsteadiness on feet: Secondary | ICD-10-CM

## 2016-12-09 DIAGNOSIS — R293 Abnormal posture: Secondary | ICD-10-CM

## 2016-12-09 DIAGNOSIS — M6281 Muscle weakness (generalized): Secondary | ICD-10-CM

## 2016-12-09 NOTE — Patient Instructions (Signed)
Straight Leg Raise   Bend one leg. Raise other leg __6-8__ inches with knee locked. Exhale and tighten thigh muscles while raising leg. Repeat with other leg. Repeat _15___ times.    Bridging   Slowly raise buttocks from floor, keeping stomach tight. Repeat __15__ times per set. Do __2__ sets per session.    Hip Abduction: Side-Lying (Single Leg)    Lie on side with knees bent, tubing around thighs just above knees. Raise top leg, keeping knee bent. Repeat _15_ times per set. Repeat on other side. Do _2_ sets per session.   Long [employer]    Straighten operated leg and try to hold it __5__ seconds. Use ____ lbs on ankle. Repeat __15__ times. Do __2__ sessions a day.   Sit to Stand / Stand to Sit / Transfers   Sit on edge of a solid chair with arms, feet flat on floor. Lean forward over feet and stand up with hands on chair arms. Sit down slowly with hands on chair arms. Repeat _15___ times per session. Do _2-3___ sessions per day.

## 2016-12-10 DIAGNOSIS — R2681 Unsteadiness on feet: Secondary | ICD-10-CM | POA: Diagnosis not present

## 2016-12-10 NOTE — Therapy (Addendum)
[facility] [address]  [place], [place], [address] Phone: [phone]   Fax:  [phone]  Physical Therapy Treatment  Patient Details  Name: Evan Wright MRN: [id number] Date of Birth: Jan 19, 1925 Referring Provider: Dr. Roma Schanz  Encounter Date: 12/09/2016      PT End of Session - 12/09/16 1013    Visit Number 7   Number of Visits 16   Date for PT Re-Evaluation 12/24/16   Authorization Type Medicare   PT Start Time 1009   PT Stop Time 1058   PT Time Calculation (min) 49 min   Activity Tolerance Patient tolerated treatment well   Behavior During Therapy [facility] for tasks assessed/performed      Past Medical History:  Diagnosis Date  . Adenomatous colon polyp   . Diverticulosis   . Hyperlipidemia   . Osteoarthritis   . Prostate cancer [facility])     Past Surgical History:  Procedure Laterality Date  . APPENDECTOMY  1948  . COLONOSCOPY    . EYE SURGERY  2012   march and April  --Dr [name]  . PROSTATECTOMY    . TONSILLECTOMY      There were no vitals filed for this visit.      Subjective Assessment - 12/09/16 1012    Subjective Patient feels about the same; has noticed that its a little easier to get up and get going from standing   Pertinent History osteoarthritis, neuropathy   Diagnostic tests NCV/EMG: chronic sensorimotor polyneuropathy   Patient Stated Goals improve balance, reduce fall risk   Currently in Pain? No/denies   Multiple Pain Sites No                         [facility] Adult PT Treatment/Exercise - 12/09/16 1020      Ambulation/Gait   Gait velocity 1.80 feet/second     Knee/Hip Exercises: Aerobic   Nustep L6 x 6 minutes     Knee/Hip Exercises: Seated   Long Arc Quad Left;Right;15 reps   Sit to [employer] 10 reps;with UE support  seated on 1 AirEx     Knee/Hip Exercises: Supine   Bridges Limitations 15 reps   Straight Leg Raises Strengthening;Left;Right;15  reps   Other Supine Knee/Hip Exercises supine clam with red tband x 15 reps each leg     Knee/Hip Exercises: Sidelying   Clams 2 x 15 reps - red tband; B LE                  PT Short Term Goals - 12/10/16 0757      PT SHORT TERM GOAL #1   Title Patient to be independent with initial HEP of OTAGO program (11/26/16)   Status Achieved           PT Long Term Goals - 12/10/16 0757      PT LONG TERM GOAL #1   Title Patient to be independent with advanced HEP (12/24/16)   Status Achieved     PT LONG TERM GOAL #2   Title Patient to improve gait velocity to >/= 2.62 feet/second demonstrating improved functional mobility (12/24/16)   Status Not Met     PT LONG TERM GOAL #3   Title Patient to improve Berg Balance to >/= 45/56 demonstrating improved balance with reduced fall risk (12/24/16)   Status Unable to assess     PT LONG TERM GOAL #4   Title Patient to  improve TUG to </= 20 seconds demonstrating improved functional mobility (12/24/16)   Status Unable to assess     PT LONG TERM GOAL #5   Title Patient to demonstrate ability to ambulate > 500 feet on various indoor and outdoor surfaces with no LOB or falls (12/24/16)   Status Unable to assess     PT LONG TERM GOAL #6   Title Patient to improve gross B LE strength to >/= 4+/5 (12/24/16)   Status Unable to assess               Plan - Dec 20, 2016 0758    Clinical Impression Statement Patient doing well. Feels like his balance has remained about the same. Does still report need to stand erect for a few moments before beginning to walk upon transfer as well as inability to change lightbulb overhead without holding onto something, and go down curb slowly. PT educating patient on need to continue this for overall safety rather than rushing to walk and go down curbs as this only increases his fall risk. Patient notifying PT at end of session that he will be travelling over the next few weeks and is unsure if he will be back to PT,  thus PT unable to assess any goals other than gait speed due to time contraints. Patient has been compliant with HEP for balance with PT issuing some low level strengthening tasks today with good carryover.    PT Treatment/Interventions ADLs/Self Care Home Management;Cryotherapy;Electrical Stimulation;Moist Heat;Ultrasound;Neuromuscular re-education;Balance training;Therapeutic exercise;Therapeutic activities;Functional mobility training;Stair training;Gait training;DME Instruction;Patient/family education;Manual techniques;Vasopneumatic Device;Taping   Consulted and Agree with Plan of Care Patient      Patient will benefit from skilled therapeutic intervention in order to improve the following deficits and impairments:  Decreased balance, Decreased mobility, Decreased strength, Difficulty walking  Visit Diagnosis: Unsteadiness on feet  Other abnormalities of gait and mobility  Muscle weakness (generalized)  Abnormal posture       G-Codes - 20-Dec-2016 0803    Functional Assessment Tool Used (Outpatient Only) FOTO: 45% limited   Functional Limitation Mobility: Walking and moving around   Mobility: Walking and Moving Around Goal Status [phone]) At least 20 percent but less than 40 percent impaired, limited or restricted   Mobility: Walking and Moving Around Discharge Status [phone]) At least 40 percent but less than 60 percent impaired, limited or restricted      Problem List Patient Active Problem List   Diagnosis Date Noted  . Idiopathic peripheral neuropathy 10/11/2016  . Proximal leg weakness 10/11/2016  . Weakness of both lower extremities 06/11/2016  . Loss of height 06/11/2016  . Hyperlipidemia 05/18/2012  . De Quervain's tenosynovitis 05/18/2012  . Family history of malignant neoplasm of gastrointestinal tract 05/14/2011  . NEVI, MULTIPLE 07/12/2010  . OSTEOARTHRITIS 07/12/2010  . UNSPECIFIED VITAMIN D DEFICIENCY 02/11/2008  . HIATAL HERNIA WITH REFLUX 02/11/2008  .  CARDIAC MURMUR 02/11/2008  . GASTROENTERITIS 01/20/2008  . PERSONAL HISTORY OF COLONIC POLYPS 11/17/2007  . DIZZINESS 10/15/2007  . ELEVATED BLOOD PRESSURE WITHOUT DIAGNOSIS OF HYPERTENSION 10/15/2007  . PROSTATE CANCER, HX OF 10/15/2007    Lanney Gins, PT, DPT Dec 20, 2016 8:04 AM   PHYSICAL THERAPY DISCHARGE SUMMARY  Visits from Start of Care: 7  Current functional level related to goals / functional outcomes: See above   Remaining deficits: See above   Education / Equipment: HEP  Plan: Patient agrees to discharge.  Patient goals were partially met. Patient is being discharged due to being pleased with the  current functional level.  ?????    Patient with primary concerns for needing to stand prior to initiating walking with PT providing education on this as a good safety practice to ensure immediate balance and no lightheaded/dizziness upon arising. Patient welcome to return to PT for any other needs.   Lanney Gins, PT, DPT 01/06/17 9:24 AM    [facility] [address]  [place], [place], [address] Phone: [phone]   Fax:  [phone]  Name: Evan Wright MRN: [id number] Date of Birth: 1925/03/29

## 2017-01-06 ENCOUNTER — Other Ambulatory Visit: Payer: Medicare Other

## 2017-01-07 ENCOUNTER — Other Ambulatory Visit: Payer: Self-pay | Admitting: Family Medicine

## 2017-01-07 ENCOUNTER — Other Ambulatory Visit (INDEPENDENT_AMBULATORY_CARE_PROVIDER_SITE_OTHER): Payer: Medicare Other

## 2017-01-07 DIAGNOSIS — E079 Disorder of thyroid, unspecified: Secondary | ICD-10-CM

## 2017-01-07 DIAGNOSIS — E039 Hypothyroidism, unspecified: Secondary | ICD-10-CM

## 2017-01-07 LAB — TSH: TSH: 1.32 u[IU]/mL (ref 0.35–4.50)

## 2017-03-21 ENCOUNTER — Ambulatory Visit: Payer: Self-pay | Admitting: Podiatry

## 2017-03-26 ENCOUNTER — Telehealth: Payer: Self-pay | Admitting: Family Medicine

## 2017-03-26 ENCOUNTER — Encounter: Payer: Self-pay | Admitting: Family Medicine

## 2017-03-26 NOTE — Telephone Encounter (Signed)
° °  Reason for call:  Daughter called wanting to know if referral can be placed, patient declined appointment with PCP and would like call when PCP returns back to the office, informed daughter PCP/Nurse will touch base some time tomorrow.

## 2017-03-26 NOTE — Telephone Encounter (Signed)
Per Tye Maryland, RN (team health triage nurse), the patient last had mild chest pain 6 days ago, lasting for 15 minutes & requesting a referral to be seen at Dr. Harmon Pier office, cardiology. RN spoke with patient & he voiced that currently he's asymptomatic; denies chest pain, dizziness, lightheadedness & numbness/tingling in the limbs, however he would like a referral to the same cardiologist (Dr. Peter [name]) that his wife sees at this time. Informed patient that PCP is out of the office today, but will route message for review of this information. Patient voiced understanding and did not have any further questions or concerns before call ended.  Dr. Carollee Herter- Please advise. Thanks.

## 2017-03-26 NOTE — Telephone Encounter (Signed)
Patient Name: Evan Wright DOB: May 05, 1925 Initial Comment caller is looking for a referral to a cardiologist . Last Thursday he experienced chest pains . No chest pains since thursday.Osvaldo Human states he has NOT had any pains or issues today . He wants to see dr. peter [name] - cardiologist but needs a referral . Office transferred caller to nurse triage Nurse Assessment Nurse: Vallery Sa, RN, Tye Maryland Date/Time Eilene Ghazi Time): 03/26/2017 10:57:59 AM Confirm and document reason for call. If symptomatic, describe symptoms. ---Caller states that he some mild chest pain with nausea 6 days ago that lasted about 15 minutes. No breathing difficulty during the episode. No chest pain since. He is calling because he wants a referral to Dr. Peter [name], Cardiologist. No symptoms today. Does the patient have any new or worsening symptoms? ---Yes Will a triage be completed? ---Yes Related visit to physician within the last 2 weeks? ---No Does the PT have any chronic conditions? (i.e. diabetes, asthma, etc.) ---Yes List chronic conditions. ---Osteopenia, Neuropathy Is this a behavioral health or substance abuse call? ---No Guidelines Guideline Title Affirmed Question Affirmed Notes Chest Pain [1] Chest pain lasts > 5 minutes AND [2] occurred > 3 days ago (72 hours) AND [3] NO chest pain or cardiac symptoms now Final Disposition User See Physician within 24 Hours [place], RN, Tye Maryland Comments Talal aware that Dr. Etter Sjogren is off today and he asks for a referral to his wife's Cardiologist. Dr. Driscilla Moats, instead of being seen at the office. Called the office backline and connected him with Ronnie. Referrals GO TO FACILITY OTHER - SPECIFY Disagree/Comply: Comply

## 2017-03-26 NOTE — Telephone Encounter (Addendum)
error:[id number] ° °

## 2017-03-27 NOTE — Telephone Encounter (Signed)
Ok to refer.

## 2017-03-27 NOTE — Telephone Encounter (Signed)
Pt called in to follow up. Pt says that he would like to have ref placed upstairs with Dr. Bettina Gavia and Agustin Cree

## 2017-03-27 NOTE — Telephone Encounter (Signed)
Ok to refer Just make note pt refused appointment

## 2017-03-28 NOTE — Telephone Encounter (Signed)
They are on EPIC, have access to referral and notes

## 2017-03-28 NOTE — Telephone Encounter (Signed)
Patient checking on the status of referral, patient scheduled with Dr. Geraldo Pitter for Wednesday 04/02/17, cardiologist requesting referral notes and referral, please advise

## 2017-04-02 ENCOUNTER — Ambulatory Visit (INDEPENDENT_AMBULATORY_CARE_PROVIDER_SITE_OTHER): Payer: Medicare Other | Admitting: Cardiology

## 2017-04-02 ENCOUNTER — Encounter: Payer: Self-pay | Admitting: Cardiology

## 2017-04-02 DIAGNOSIS — I209 Angina pectoris, unspecified: Secondary | ICD-10-CM | POA: Diagnosis not present

## 2017-04-02 DIAGNOSIS — R079 Chest pain, unspecified: Secondary | ICD-10-CM

## 2017-04-02 DIAGNOSIS — R9431 Abnormal electrocardiogram [ECG] [EKG]: Secondary | ICD-10-CM | POA: Insufficient documentation

## 2017-04-02 HISTORY — DX: Abnormal electrocardiogram (ECG) (EKG): R94.31

## 2017-04-02 MED ORDER — ASPIRIN EC 81 MG PO TBEC: 81.0000 mg | DELAYED_RELEASE_TABLET | Freq: Every day | ORAL | 0 refills | Status: DC

## 2017-04-02 MED ORDER — NITROGLYCERIN 0.4 MG SL SUBL: 0.4000 mg | SUBLINGUAL_TABLET | SUBLINGUAL | 11 refills | Status: DC | PRN

## 2017-04-02 NOTE — Patient Instructions (Signed)
Medication Instructions:  Your physician has recommended you make the following change in your medication:  START aspirin EC 81 mg daily START nitroglycerin 0.4 mg sublingual (under your tongue) as needed for chest pain. When having chest pain, stop what you are doing and sit down. Take 1 nitro, wait 5 minutes. Still having chest pain, take 1 nitro, wait 5 minutes. Still having chest pain, take 1 nitro, dial 911. Total of 3 nitro in 15 minutes.    Labwork: Your physician recommends that you return for lab work in: today. BMP, LFT, lipid   Testing/Procedures: You had an EKG today.  Your physician has requested that you have an echocardiogram. Echocardiography is a painless test that uses sound waves to create images of your heart. It provides your doctor with information about the size and shape of your heart and how well your heart's chambers and valves are working. This procedure takes approximately one hour. There are no restrictions for this procedure.  Your physician has requested that you have a lexiscan myoview. For further information please visit [email or web address]. Please follow instruction sheet, as given.  Follow-Up: Your physician recommends that you schedule a follow-up appointment in: 1 month   Any Other Special Instructions Will Be Listed Below (If Applicable).     If you need a refill on your cardiac medications before your next appointment, please call your pharmacy.

## 2017-04-02 NOTE — Addendum Note (Signed)
Addended by: Orland Penman on: 04/02/2017 10:26 AM   Modules accepted: Orders

## 2017-04-02 NOTE — Addendum Note (Signed)
Addended by: Jossie Ng on: 04/02/2017 09:58 AM   Modules accepted: Orders

## 2017-04-02 NOTE — Progress Notes (Signed)
Cardiology Office Note:    Date:  04/02/2017   ID:  Evan Wright, DOB September 09, 1924, MRN [id number]  PCP:  Carollee Herter, Alferd Apa, DO  Cardiologist:  Jenean Lindau, MD   Referring MD: Carollee Herter, Alferd Apa, *    ASSESSMENT:    1. Angina pectoris ([name])   2. Abnormal EKG    PLAN:    In order of problems listed above:  1. Primary prevention stressed to the patient. Importance of compliance with diet and medications stressed and he vocalized understanding. His symptoms are of significant concerns especially in view of the fact of his advanced age. I told him to take 1 coated aspirin 81 mg on a daily basis. Sublingual nitroglycerin prescription was sent. His protocol and 911 protocol explained placed understanding. Patient will have echocardiogram to assess murmur heard on auscultation. Patient will also have a Lexiscan sestamibi. Further recommendations will be made based on the findings of these tests. I will do blood work including lipids to risk stratify him. He has been told to avoid anything strenuous in the next few days still he sees me in appointment in a month.   Medication Adjustments/Labs and Tests Ordered: Current medicines are reviewed at length with the patient today.  Concerns regarding medicines are outlined above.  No orders of the defined types were placed in this encounter.  No orders of the defined types were placed in this encounter.    History of Present Illness:    Evan Wright is a 83 y.o. male who is being seen today for the evaluation of chest pain at the request of [name], *. Patient is a pleasant 83 year old male. He has past medical history of thyroid problems. He is accompanied by his wife and 2 daughters and a very supportive family. He mentions to me that he had chest tightness 2 weeks ago and felt nauseous. He did not go to the emergency room. No orthopnea or PND. No radiation of the symptoms to any part of the body. At the time of my  evaluation is alert awake oriented and in no distress in the past 2 weeks that has been no recurrence of the symptoms.  Past Medical History:  Diagnosis Date  . Adenomatous colon polyp   . Diverticulosis   . Hyperlipidemia   . Osteoarthritis   . Prostate cancer [facility])     Past Surgical History:  Procedure Laterality Date  . APPENDECTOMY  1948  . COLONOSCOPY    . EYE SURGERY  2012   march and April  --Dr [name]  . PROSTATECTOMY    . TONSILLECTOMY      Current Medications: Current Meds  Medication Sig  . Calcium Carbonate (CALTRATE 600) 1500 MG TABS Take 1,200 mg by mouth daily.   . Cholecalciferol (VITAMIN D PO) Take 2,000 Units by mouth daily.   [name] levothyroxine (SYNTHROID, LEVOTHROID) 50 MCG tablet Take 1 tablet (50 mcg total) by mouth daily.  . Multiple Vitamins-Minerals (EYE VITAMINS) CAPS Take 1 capsule by mouth daily.  . Psyllium (METAMUCIL PO) Take by mouth daily.     Allergies:   Penicillins   Social History   Social History  . Marital status: Married    Spouse name: N/A  . Number of children: 2  . Years of education: N/A   Occupational History  . retired Retired   Social History Main Topics  . Smoking status: Never Smoker  . Smokeless tobacco: Never Used  . Alcohol use  No  . Drug use: No  . Sexual activity: Not Asked   Other Topics Concern  . None   Social History Narrative  . None     Family History: The patient's family history includes Colitis in his mother; Colon cancer in his brother; Lung cancer in his father; Prostate cancer in his father.  ROS:   Please see the history of present illness.    All other systems reviewed and are negative.  EKGs/Labs/Other Studies Reviewed:    The following studies were reviewed today: I reviewed lab work and lipids done in the past and discussed this with the patient at length.   Recent Labs: 06/11/2016: ALT 27; BUN 15; Creatinine, Ser 0.98; Hemoglobin 15.9; Platelets 210.0; Potassium 5.0; Sodium  142 01/07/2017: TSH 1.32  Recent Lipid Panel    Component Value Date/Time   CHOL 210 (H) 06/11/2016 1111   TRIG 186.0 (H) 06/11/2016 1111   HDL 45.70 06/11/2016 1111   CHOLHDL 5 06/11/2016 1111   VLDL 37.2 06/11/2016 1111   LDLCALC 127 (H) 06/11/2016 1111   LDLDIRECT 165.3 10/08/2010 1008    Physical Exam:    VS:  BP 138/76   Pulse 73   Ht 5\' 10"  (1.778 m)   Wt 176 lb 12.8 oz (80.2 kg)   SpO2 97%   BMI 25.37 kg/m     Wt Readings from Last 3 Encounters:  04/02/17 176 lb 12.8 oz (80.2 kg)  10/11/16 178 lb 8 oz (81 kg)  08/13/16 179 lb 5 oz (81.3 kg)     GEN: Patient is in no acute distress HEENT: Normal NECK: No JVD; No carotid bruits LYMPHATICS: No lymphadenopathy CARDIAC: S1 S2 regular, 2/6 systolic murmur at the apex. RESPIRATORY:  Clear to auscultation without rales, wheezing or rhonchi  ABDOMEN: Soft, non-tender, non-distended MUSCULOSKELETAL:  No edema; No deformity  SKIN: Warm and dry NEUROLOGIC:  Alert and oriented x 3 PSYCHIATRIC:  Normal affect    Signed, Jenean Lindau, MD  04/02/2017 9:44 AM    [place]

## 2017-04-03 ENCOUNTER — Ambulatory Visit: Payer: Self-pay | Admitting: Podiatry

## 2017-04-03 ENCOUNTER — Telehealth (HOSPITAL_COMMUNITY): Payer: Self-pay | Admitting: *Deleted

## 2017-04-03 DIAGNOSIS — R9431 Abnormal electrocardiogram [ECG] [EKG]: Secondary | ICD-10-CM | POA: Diagnosis not present

## 2017-04-03 DIAGNOSIS — I209 Angina pectoris, unspecified: Secondary | ICD-10-CM | POA: Diagnosis not present

## 2017-04-03 NOTE — Telephone Encounter (Signed)
Left message on voicemail per DPR in reference to upcoming appointment scheduled on 04/08/17 with detailed instructions given per Myocardial Perfusion Study Information Sheet for the test. LM to arrive 15 minutes early, and that it is imperative to arrive on time for appointment to keep from having the test rescheduled. If you need to cancel or reschedule your appointment, please call the office within 24 hours of your appointment. Failure to do so may result in a cancellation of your appointment, and a $50 no show fee. Phone number given for call back for any questions. Cartier Mapel Jacqueline    

## 2017-04-04 ENCOUNTER — Other Ambulatory Visit: Payer: Self-pay

## 2017-04-04 DIAGNOSIS — I209 Angina pectoris, unspecified: Secondary | ICD-10-CM

## 2017-04-04 DIAGNOSIS — E782 Mixed hyperlipidemia: Secondary | ICD-10-CM

## 2017-04-04 LAB — LIPID PANEL
CHOL/HDL RATIO: 5 ratio (ref 0.0–5.0)
Cholesterol, Total: 208 mg/dL — ABNORMAL HIGH (ref 100–199)
HDL: 42 mg/dL (ref >39)
LDL CALC: 129 mg/dL — AB (ref 0–99)
Triglycerides: 184 mg/dL — ABNORMAL HIGH (ref 0–149)
VLDL CHOLESTEROL CAL: 37 mg/dL (ref 5–40)

## 2017-04-04 LAB — BASIC METABOLIC PANEL
BUN / CREAT RATIO: 13 (ref 10–24)
BUN: 13 mg/dL (ref 10–36)
CO2: 22 mmol/L (ref 20–29)
CREATININE: 0.99 mg/dL (ref 0.76–1.27)
Calcium: 9.7 mg/dL (ref 8.6–10.2)
Chloride: 104 mmol/L (ref 96–106)
GFR, EST AFRICAN AMERICAN: 77 mL/min/{1.73_m2} (ref >59)
GFR, EST NON AFRICAN AMERICAN: 67 mL/min/{1.73_m2} (ref >59)
Glucose: 175 mg/dL — ABNORMAL HIGH (ref 65–99)
POTASSIUM: 4.4 mmol/L (ref 3.5–5.2)
SODIUM: 142 mmol/L (ref 134–144)

## 2017-04-04 LAB — HEPATIC FUNCTION PANEL
ALBUMIN: 4.4 g/dL (ref 3.2–4.6)
ALT: 19 IU/L (ref 0–44)
AST: 21 IU/L (ref 0–40)
Alkaline Phosphatase: 67 IU/L (ref 39–117)
BILIRUBIN TOTAL: 0.7 mg/dL (ref 0.0–1.2)
BILIRUBIN, DIRECT: 0.18 mg/dL (ref 0.00–0.40)
TOTAL PROTEIN: 7 g/dL (ref 6.0–8.5)

## 2017-04-08 ENCOUNTER — Other Ambulatory Visit: Payer: Self-pay

## 2017-04-08 ENCOUNTER — Ambulatory Visit (HOSPITAL_BASED_OUTPATIENT_CLINIC_OR_DEPARTMENT_OTHER): Payer: Medicare Other

## 2017-04-08 ENCOUNTER — Ambulatory Visit (HOSPITAL_COMMUNITY): Payer: Medicare Other | Attending: Cardiology

## 2017-04-08 DIAGNOSIS — R9431 Abnormal electrocardiogram [ECG] [EKG]: Secondary | ICD-10-CM | POA: Insufficient documentation

## 2017-04-08 DIAGNOSIS — I209 Angina pectoris, unspecified: Secondary | ICD-10-CM | POA: Diagnosis not present

## 2017-04-08 DIAGNOSIS — R079 Chest pain, unspecified: Secondary | ICD-10-CM | POA: Diagnosis not present

## 2017-04-08 DIAGNOSIS — I071 Rheumatic tricuspid insufficiency: Secondary | ICD-10-CM | POA: Insufficient documentation

## 2017-04-08 DIAGNOSIS — E785 Hyperlipidemia, unspecified: Secondary | ICD-10-CM | POA: Insufficient documentation

## 2017-04-08 LAB — MYOCARDIAL PERFUSION IMAGING
CHL CUP NUCLEAR SRS: 28
CHL CUP RESTING HR STRESS: 60 {beats}/min
CSEPPHR: 74 {beats}/min
LV dias vol: 117 mL (ref 62–150)
LV sys vol: 79 mL
RATE: 0.29
SDS: 8
SSS: 36
TID: 1.15

## 2017-04-08 MED ORDER — TECHNETIUM TC 99M TETROFOSMIN IV KIT
32.6000 | PACK | Freq: Once | INTRAVENOUS | Status: AC | PRN
  Administered 2017-04-08: 32.6 via INTRAVENOUS
  Filled 2017-04-08: qty 33

## 2017-04-08 MED ORDER — TECHNETIUM TC 99M TETROFOSMIN IV KIT
11.0000 | PACK | Freq: Once | INTRAVENOUS | Status: AC | PRN
  Administered 2017-04-08: 11 via INTRAVENOUS
  Filled 2017-04-08: qty 11

## 2017-04-08 MED ORDER — REGADENOSON 0.4 MG/5ML IV SOLN
0.4000 mg | Freq: Once | INTRAVENOUS | Status: AC
  Administered 2017-04-08: 0.4 mg via INTRAVENOUS

## 2017-04-10 ENCOUNTER — Ambulatory Visit: Payer: Medicare Other | Admitting: Cardiology

## 2017-04-10 ENCOUNTER — Ambulatory Visit (INDEPENDENT_AMBULATORY_CARE_PROVIDER_SITE_OTHER): Payer: Medicare Other | Admitting: Cardiology

## 2017-04-10 ENCOUNTER — Encounter: Payer: Self-pay | Admitting: Cardiology

## 2017-04-10 DIAGNOSIS — I255 Ischemic cardiomyopathy: Secondary | ICD-10-CM | POA: Insufficient documentation

## 2017-04-10 DIAGNOSIS — E782 Mixed hyperlipidemia: Secondary | ICD-10-CM | POA: Insufficient documentation

## 2017-04-10 HISTORY — DX: Mixed hyperlipidemia: E78.2

## 2017-04-10 HISTORY — DX: Ischemic cardiomyopathy: I25.5

## 2017-04-10 MED ORDER — CARVEDILOL 3.125 MG PO TABS: 3.1250 mg | ORAL_TABLET | Freq: Two times a day (BID) | ORAL | 11 refills | Status: DC

## 2017-04-10 MED ORDER — ATORVASTATIN CALCIUM 10 MG PO TABS: 10.0000 mg | ORAL_TABLET | Freq: Every day | ORAL | 11 refills | Status: DC

## 2017-04-10 NOTE — Addendum Note (Signed)
Addended by: Kathyrn Sheriff on: 04/10/2017 09:55 AM   Modules accepted: Orders

## 2017-04-10 NOTE — Addendum Note (Signed)
Addended by: Kathyrn Sheriff on: 04/10/2017 09:53 AM   Modules accepted: Orders

## 2017-04-10 NOTE — Patient Instructions (Addendum)
Medication Instructions:  Your physician has recommended you make the following change in your medication:  1.) START Coreg (carvediolol) 3.125 mg twice daily.  2.) START atorvastatin (Lipitor) 10 mg daily.   Labwork: Your physician recommends that you return for lab work in: 1 month fasting so we can check your cholesterol and liver function.   Testing/Procedures: Your physician has recommended that you wear a holter monitor for 48 hours. Holter monitors are medical devices that record the heart's electrical activity. Doctors most often use these monitors to diagnose arrhythmias. Arrhythmias are problems with the speed or rhythm of the heartbeat. The monitor is a small, portable device. You can wear one while you do your normal daily activities. This is usually used to diagnose what is causing palpitations/syncope (passing out).   Follow-Up: Your physician recommends that you schedule a follow-up appointment in: 1 month   Any Other Special Instructions Will Be Listed Below (If Applicable).  Please note that any paperwork needing to be filled out by the provider will need to be addressed at the front desk prior to seeing the provider. Please note that any paperwork FMLA, Disability or other documents regarding health condition is subject to a $25.00 charge that must be received prior to completion of paperwork in the form of a money order or check.    If you need a refill on your cardiac medications before your next appointment, please call your pharmacy.  Cholesterol Cholesterol is a white, waxy, fat-like substance that is needed by the human body in small amounts. The liver makes all the cholesterol we need. Cholesterol is carried from the liver by the blood through the blood vessels. Deposits of cholesterol (plaques) may build up on blood vessel (artery) walls. Plaques make the arteries narrower and stiffer. Cholesterol plaques increase the risk for heart attack and stroke. You cannot feel  your cholesterol level even if it is very high. The only way to know that it is high is to have a blood test. Once you know your cholesterol levels, you should keep a record of the test results. Work with your health care provider to keep your levels in the desired range. What do the results mean?  Total cholesterol is a rough measure of all the cholesterol in your blood.  LDL (low-density lipoprotein) is the "bad" cholesterol. This is the type that causes plaque to build up on the artery walls. You want this level to be low.  HDL (high-density lipoprotein) is the "good" cholesterol because it cleans the arteries and carries the LDL away. You want this level to be high.  Triglycerides are fat that the body can either burn for energy or store. High levels are closely linked to heart disease. What are the desired levels of cholesterol?  Total cholesterol below 200.  LDL below 100 for people who are at risk, below 70 for people at very high risk.  HDL above 40 is good. A level of 60 or higher is considered to be protective against heart disease.  Triglycerides below 150. How can I lower my cholesterol? Diet Follow your diet program as told by your health care provider.  Choose fish or white meat chicken and [place], roasted or baked. Limit fatty cuts of red meat, fried foods, and processed meats, such as sausage and lunch meats.  Eat lots of fresh fruits and vegetables.  Choose whole grains, beans, pasta, potatoes, and cereals.  Choose olive oil, corn oil, or canola oil, and use only small amounts.  Avoid butter, mayonnaise, shortening, or palm kernel oils.  Avoid foods with trans fats.  Drink skim or nonfat milk and eat low-fat or nonfat yogurt and cheeses. Avoid whole milk, cream, ice cream, egg yolks, and full-fat cheeses.  Healthier desserts include angel food cake, ginger snaps, animal crackers, hard candy, popsicles, and low-fat or nonfat frozen yogurt. Avoid pastries, cakes,  pies, and cookies.  Exercise  Follow your exercise program as told by your health care provider. A regular program: ? Helps to decrease LDL and raise HDL. ? Helps with weight control.  Do things that increase your activity level, such as gardening, walking, and taking the stairs.  Ask your health care provider about ways that you can be more active in your daily life.  Medicine  Take over-the-counter and prescription medicines only as told by your health care provider. ? Medicine may be prescribed by your health care provider to help lower cholesterol and decrease the risk for heart disease. This is usually done if diet and exercise have failed to bring down cholesterol levels. ? If you have several risk factors, you may need medicine even if your levels are normal.  This information is not intended to replace advice given to you by your health care provider. Make sure you discuss any questions you have with your health care provider. Document Released: 04/02/2001 Document Revised: 02/03/2016 Document Reviewed: 01/06/2016 Elsevier Interactive Patient Education  2017 [employer].

## 2017-04-10 NOTE — Progress Notes (Signed)
Cardiology Office Note:    Date:  04/10/2017   ID:  Evan Wright, DOB 30-Apr-1925, MRN [id number]  PCP:  Carollee Herter, Alferd Apa, DO  Cardiologist:  Jenean Lindau, MD   Referring MD: Carollee Herter, Alferd Apa, *    ASSESSMENT:    1. Ischemic cardiomyopathy   2. Mixed dyslipidemia    PLAN:    In order of problems listed above:  1. The findings of the echocardiogram and stress test and cardiomyopathy was discussed with the patient. The patient at this time is asymptomatic and 83 year old. Benefits and potential risks of aggressive invasive approach the possibility of evaluating for electrophysiologic studies was discussed with the patient and the family are not very keen on this and I respect her wishes. I we will initiate him on carvedilol 3.125 mg twice a day 2. Lipids were reviewed and in view of the current findings have initiated him on atorvastatin 10 mg daily.  3.     Patient will be seen in follow-up appointment and 40 liver lipid check in the next one month. He knows to go to the nearest emergency room for any concerning symptoms. Total time for this evaluation was 30 minutes.   Medication Adjustments/Labs and Tests Ordered: Current medicines are reviewed at length with the patient today.  Concerns regarding medicines are outlined above.  No orders of the defined types were placed in this encounter.  No orders of the defined types were placed in this encounter.    Chief Complaint  Patient presents with  . Follow-up     History of Present Illness:    Evan Wright is a 83 y.o. male patient and he was evaluated for significant symptoms suggesting of a possible myocardial infarction. Patient underwent echocardiogram and stress testing and has significant areas of fixed defects suggesting possible myocardial infarction of uncertain cause. No areas of ischemia identified. I called the patient to discuss these results. He is completely his wife and 2 daughters were very  supportive. At the time of my evaluation is alert awake oriented and in no distress. He mentions to me that with activities of daily living he has no symptoms.  Past Medical History:  Diagnosis Date  . Adenomatous colon polyp   . Diverticulosis   . Hyperlipidemia   . Osteoarthritis   . Prostate cancer [facility])     Past Surgical History:  Procedure Laterality Date  . APPENDECTOMY  1948  . COLONOSCOPY    . EYE SURGERY  2012   march and April  --Dr [name]  . PROSTATECTOMY    . TONSILLECTOMY      Current Medications: Current Meds  Medication Sig  . aspirin EC 81 MG tablet Take 1 tablet (81 mg total) by mouth daily.  . Calcium Carbonate (CALTRATE 600) 1500 MG TABS Take 1,200 mg by mouth daily.   . Cholecalciferol (VITAMIN D PO) Take 2,000 Units by mouth daily.   [name] levothyroxine (SYNTHROID, LEVOTHROID) 50 MCG tablet Take 1 tablet (50 mcg total) by mouth daily.  . Multiple Vitamins-Minerals (EYE VITAMINS) CAPS Take 1 capsule by mouth daily.  . nitroGLYCERIN (NITROSTAT) 0.4 MG SL tablet Place 1 tablet (0.4 mg total) under the tongue every 5 (five) minutes as needed for chest pain.  [name] Psyllium (METAMUCIL PO) Take by mouth daily.     Allergies:   Penicillins   Social History   Social History  . Marital status: Married    Spouse name: N/A  . Number of children:  2  . Years of education: N/A   Occupational History  . retired Retired   Social History Main Topics  . Smoking status: Never Smoker  . Smokeless tobacco: Never Used  . Alcohol use No  . Drug use: No  . Sexual activity: Not Asked   Other Topics Concern  . None   Social History Narrative  . None     Family History: The patient's family history includes Colitis in his mother; Colon cancer in his brother; Lung cancer in his father; Prostate cancer in his father.  ROS:   Please see the history of present illness.    All other systems reviewed and are negative.  EKGs/Labs/Other Studies Reviewed:    The following  studies were reviewed today: I mentioned to the patient and his family the findings of the echocardiogram and the stress test at extensive length. We had to discuss questions which was extensive and detailed and questions were answered to their satisfaction.   Recent Labs: 06/11/2016: Hemoglobin 15.9; Platelets 210.0 01/07/2017: TSH 1.32 04/03/2017: ALT 19; BUN 13; Creatinine, Ser 0.99; Potassium 4.4; Sodium 142  Recent Lipid Panel    Component Value Date/Time   CHOL 208 (H) 04/03/2017 1002   TRIG 184 (H) 04/03/2017 1002   HDL 42 04/03/2017 1002   CHOLHDL 5.0 04/03/2017 1002   CHOLHDL 5 06/11/2016 1111   VLDL 37.2 06/11/2016 1111   LDLCALC 129 (H) 04/03/2017 1002   LDLDIRECT 165.3 10/08/2010 1008    Physical Exam:    VS:  BP 136/66   Pulse 71   Ht 5\' 11"  (1.803 m)   Wt 175 lb 1.9 oz (79.4 kg)   SpO2 98%   BMI 24.42 kg/m     Wt Readings from Last 3 Encounters:  04/10/17 175 lb 1.9 oz (79.4 kg)  04/08/17 176 lb (79.8 kg)  04/02/17 176 lb 12.8 oz (80.2 kg)     GEN: Patient is in no acute distress HEENT: Normal NECK: No JVD; No carotid bruits LYMPHATICS: No lymphadenopathy CARDIAC: Hear sounds regular, 2/6 systolic murmur at the apex. RESPIRATORY:  Clear to auscultation without rales, wheezing or rhonchi  ABDOMEN: Soft, non-tender, non-distended MUSCULOSKELETAL:  No edema; No deformity  SKIN: Warm and dry NEUROLOGIC:  Alert and oriented x 3 PSYCHIATRIC:  Normal affect   Signed, Jenean Lindau, MD  04/10/2017 9:43 AM    [place]

## 2017-04-15 DIAGNOSIS — H353131 Nonexudative age-related macular degeneration, bilateral, early dry stage: Secondary | ICD-10-CM | POA: Diagnosis not present

## 2017-04-15 DIAGNOSIS — H524 Presbyopia: Secondary | ICD-10-CM | POA: Diagnosis not present

## 2017-04-15 DIAGNOSIS — Z961 Presence of intraocular lens: Secondary | ICD-10-CM | POA: Diagnosis not present

## 2017-04-15 DIAGNOSIS — H26491 Other secondary cataract, right eye: Secondary | ICD-10-CM | POA: Diagnosis not present

## 2017-04-16 ENCOUNTER — Ambulatory Visit: Payer: Medicare Other

## 2017-04-16 DIAGNOSIS — E782 Mixed hyperlipidemia: Secondary | ICD-10-CM

## 2017-04-16 DIAGNOSIS — I255 Ischemic cardiomyopathy: Secondary | ICD-10-CM

## 2017-04-17 ENCOUNTER — Ambulatory Visit: Payer: Medicare Other | Admitting: Neurology

## 2017-04-21 DIAGNOSIS — I209 Angina pectoris, unspecified: Secondary | ICD-10-CM

## 2017-04-21 HISTORY — DX: Angina pectoris, unspecified: I20.9

## 2017-04-23 ENCOUNTER — Telehealth: Payer: Self-pay | Admitting: Cardiology

## 2017-04-23 NOTE — Telephone Encounter (Signed)
Pt c/o of Chest Pain: STAT if CP now or developed within 24 hours  1. Are you having CP right now? No   2. Are you experiencing any other symptoms (ex. SOB, nausea, vomiting, sweating)? No   3. How long have you been experiencing CP? Today   4. Is your CP continuous or coming and going? Continuous   5. Have you taken Nitroglycerin? Yes 2 and then took alka seltzer   Discussed care with Dr. Geraldo Pitter and pt was educated regarding the nitro protocol and 911. He was also arranged for a follow up tomorrow in office.  ?

## 2017-04-23 NOTE — Telephone Encounter (Signed)
Patient states that he had some pains in his chest and they didn't go away for some time and he took 2 nitro and the pain did not go away immediately, but it has eased off and he also took [employer].  He is just concerned and wants to talk to someone.[name]

## 2017-04-24 ENCOUNTER — Ambulatory Visit (INDEPENDENT_AMBULATORY_CARE_PROVIDER_SITE_OTHER): Payer: Medicare Other | Admitting: Cardiology

## 2017-04-24 ENCOUNTER — Ambulatory Visit (HOSPITAL_BASED_OUTPATIENT_CLINIC_OR_DEPARTMENT_OTHER)
Admission: RE | Admit: 2017-04-24 | Discharge: 2017-04-24 | Disposition: A | Discharge status: Home / Self Care | Payer: Medicare Other | Source: Ambulatory Visit | Attending: Cardiology | Admitting: Cardiology

## 2017-04-24 ENCOUNTER — Encounter: Payer: Self-pay | Admitting: Cardiology

## 2017-04-24 VITALS — BP 130/32 | HR 64 | Ht 72.0 in | Wt 173.1 lb

## 2017-04-24 DIAGNOSIS — I255 Ischemic cardiomyopathy: Secondary | ICD-10-CM | POA: Diagnosis not present

## 2017-04-24 DIAGNOSIS — I4729 Other ventricular tachycardia: Secondary | ICD-10-CM

## 2017-04-24 DIAGNOSIS — R079 Chest pain, unspecified: Secondary | ICD-10-CM | POA: Diagnosis not present

## 2017-04-24 DIAGNOSIS — E782 Mixed hyperlipidemia: Secondary | ICD-10-CM

## 2017-04-24 DIAGNOSIS — I209 Angina pectoris, unspecified: Secondary | ICD-10-CM | POA: Insufficient documentation

## 2017-04-24 DIAGNOSIS — I472 Ventricular tachycardia: Secondary | ICD-10-CM

## 2017-04-24 HISTORY — DX: Other ventricular tachycardia: I47.29

## 2017-04-24 HISTORY — DX: Ventricular tachycardia: I47.2

## 2017-04-24 NOTE — Progress Notes (Signed)
Cardiology Office Note:    Date:  04/24/2017   ID:  Evan Wright, DOB 1924/12/02, MRN [id number]  PCP:  Evan Wright, Evan Apa, DO  Cardiologist:  Evan Lindau, MD   Referring MD: Evan Wright, Evan Wright, *    ASSESSMENT:    1. Angina pectoris ([place])   2. Ischemic cardiomyopathy   3. Mixed dyslipidemia   4. Nonsustained ventricular tachycardia (HCC)    PLAN:    In order of problems listed above:  1. I discussed coronary angiography and left heart catheterization with the patient at extensive length. Procedure, benefits and potential risks were explained. Patient had multiple questions which were answered to the patient's satisfaction. Patient agreed and consented for the procedure. Further recommendations will be made based on the findings of the coronary angiography. In the interim. The patient has any significant symptoms he knows to go to the nearest emergency room. 2. The patient and family took extensive time to understand the Process of coronary angiography and had multiple questions and I made sure that I explained to them including the risks in view of his advanced age and they're agreeable. 3. The test has been set for October 9 at [facility]   Medication Adjustments/Labs and Tests Ordered: Current medicines are reviewed at length with the patient today.  Concerns regarding medicines are outlined above.  Orders Placed This Encounter  Procedures  . DG Chest 2 View  . Basic metabolic panel  . Protime-INR  . CBC with Differential/Platelet   No orders of the defined types were placed in this encounter.    Chief Complaint  Patient presents with  . Follow-up  . Chest Pain  . Fatigue     History of Present Illness:    Evan Wright is a 83 y.o. male . The patient called our office and mentioning that he's been having substernal chest tightness relieved with nitroglycerin. These symptoms are offering fairly consistently on exertion and this is concerned the  family. Therefore I called the patient for evaluation. His symptoms are very consistent for angina on exertion and it is to be noted that he's had a significant abnormal stress test and now a Holter monitor. At the time of my evaluation is alert awake oriented and in no distress  Past Medical History:  Diagnosis Date  . Adenomatous colon polyp   . Diverticulosis   . Hyperlipidemia   . Osteoarthritis   . Prostate cancer [facility])     Past Surgical History:  Procedure Laterality Date  . APPENDECTOMY  1948  . COLONOSCOPY    . EYE SURGERY  2012   march and April  --Evan Wright  . PROSTATECTOMY    . TONSILLECTOMY      Current Medications: Current Meds  Medication Sig  . aspirin EC 81 MG tablet Take 1 tablet (81 mg total) by mouth daily.  [name] atorvastatin (LIPITOR) 10 MG tablet Take 1 tablet (10 mg total) by mouth daily.  . Calcium Carbonate (CALTRATE 600) 1500 MG TABS Take 1,200 mg by mouth daily.   . carvedilol (COREG) 3.125 MG tablet Take 1 tablet (3.125 mg total) by mouth 2 (two) times daily.  . Cholecalciferol (VITAMIN D PO) Take 2,000 Units by mouth daily.   [name] levothyroxine (SYNTHROID, LEVOTHROID) 50 MCG tablet Take 1 tablet (50 mcg total) by mouth daily.  . Multiple Vitamins-Minerals (EYE VITAMINS) CAPS Take 1 capsule by mouth daily.  . nitroGLYCERIN (NITROSTAT) 0.4 MG SL tablet Place 1 tablet (0.4  mg total) under the tongue every 5 (five) minutes as needed for chest pain.  [name] Psyllium (METAMUCIL PO) Take by mouth daily.     Allergies:   Penicillins   Social History   Social History  . Marital status: Married    Spouse name: N/A  . Number of children: 2  . Years of education: N/A   Occupational History  . retired Retired   Social History Main Topics  . Smoking status: Never Smoker  . Smokeless tobacco: Never Used  . Alcohol use No  . Drug use: No  . Sexual activity: Not Asked   Other Topics Concern  . None   Social History Narrative  . None     Family  History: The patient's family history includes Colitis in his mother; Colon cancer in his brother; Lung cancer in his father; Prostate cancer in his father.  ROS:   Please see the history of present illness.    All other systems reviewed and are negative.  EKGs/Labs/Other Studies Reviewed:    The following studies were reviewed today: I reviewed the findings of the stress test with the patient at extensive length and he verbalized understanding   Recent Labs: 06/11/2016: Hemoglobin 15.9; Platelets 210.0 01/07/2017: TSH 1.32 04/03/2017: ALT 19; BUN 13; Creatinine, Ser 0.99; Potassium 4.4; Sodium 142  Recent Lipid Panel    Component Value Date/Time   CHOL 208 (H) 04/03/2017 1002   TRIG 184 (H) 04/03/2017 1002   HDL 42 04/03/2017 1002   CHOLHDL 5.0 04/03/2017 1002   CHOLHDL 5 06/11/2016 1111   VLDL 37.2 06/11/2016 1111   LDLCALC 129 (H) 04/03/2017 1002   LDLDIRECT 165.3 10/08/2010 1008    Physical Exam:    VS:  BP (!) 130/32   Pulse 64   Ht 6' (1.829 m)   Wt 173 lb 1.3 oz (78.5 kg)   SpO2 97%   BMI 23.47 kg/m     Wt Readings from Last 3 Encounters:  04/24/17 173 lb 1.3 oz (78.5 kg)  04/10/17 175 lb 1.9 oz (79.4 kg)  04/08/17 176 lb (79.8 kg)     GEN: Patient is in no acute distress HEENT: Normal NECK: No JVD; No carotid bruits LYMPHATICS: No lymphadenopathy CARDIAC: Hear sounds regular, 2/6 systolic murmur at the apex. RESPIRATORY:  Clear to auscultation without rales, wheezing or rhonchi  ABDOMEN: Soft, non-tender, non-distended MUSCULOSKELETAL:  No edema; No deformity  SKIN: Warm and dry NEUROLOGIC:  Alert and oriented x 3 PSYCHIATRIC:  Normal affect   Signed, Evan Lindau, MD  04/24/2017 11:44 AM    [place]

## 2017-04-24 NOTE — Patient Instructions (Addendum)
Medication Instructions:   Your physician recommends that you continue on your current medications as directed. Please refer to the Current Medication list given to you today.   Labwork: Your physician recommends that you return for lab work in: Today   Testing/Procedures: Your physician has requested that you have a cardiac catheterization. Cardiac catheterization is used to diagnose and/or treat various heart conditions. Doctors may recommend this procedure for a number of different reasons. The most common reason is to evaluate chest pain. Chest pain can be a symptom of coronary artery disease (CAD), and cardiac catheterization can show whether plaque is narrowing or blocking your heart's arteries. This procedure is also used to evaluate the valves, as well as measure the blood flow and oxygen levels in different parts of your heart. For further information please visit [email or web address]. Please follow instruction sheet, as given.    Follow-Up: To be determined after the heart cath Any Other Special Instructions Will Be Listed Below (If Applicable).     If you need a refill on your cardiac medications before your next appointment, please call your pharmacy.

## 2017-04-25 LAB — BASIC METABOLIC PANEL
BUN/Creatinine Ratio: 17 (ref 10–24)
BUN: 17 mg/dL (ref 10–36)
CALCIUM: 9.9 mg/dL (ref 8.6–10.2)
CHLORIDE: 103 mmol/L (ref 96–106)
CO2: 23 mmol/L (ref 20–29)
Creatinine, Ser: 0.99 mg/dL (ref 0.76–1.27)
GFR calc non Af Amer: 67 mL/min/{1.73_m2} (ref >59)
GFR, EST AFRICAN AMERICAN: 77 mL/min/{1.73_m2} (ref >59)
GLUCOSE: 162 mg/dL — AB (ref 65–99)
POTASSIUM: 4.2 mmol/L (ref 3.5–5.2)
Sodium: 142 mmol/L (ref 134–144)

## 2017-04-25 LAB — CBC WITH DIFFERENTIAL/PLATELET
BASOS ABS: 0 10*3/uL (ref 0.0–0.2)
Basos: 0 %
EOS (ABSOLUTE): 0.1 10*3/uL (ref 0.0–0.4)
Eos: 1 %
HEMATOCRIT: 46.4 % (ref 37.5–51.0)
HEMOGLOBIN: 15.4 g/dL (ref 13.0–17.7)
IMMATURE GRANS (ABS): 0 10*3/uL (ref 0.0–0.1)
IMMATURE GRANULOCYTES: 0 %
LYMPHS ABS: 1.4 10*3/uL (ref 0.7–3.1)
Lymphs: 21 %
MCH: 31 pg (ref 26.6–33.0)
MCHC: 33.2 g/dL (ref 31.5–35.7)
MCV: 93 fL (ref 79–97)
MONOS ABS: 0.7 10*3/uL (ref 0.1–0.9)
Monocytes: 10 %
NEUTROS PCT: 68 %
Neutrophils Absolute: 4.5 10*3/uL (ref 1.4–7.0)
Platelets: 215 10*3/uL (ref 150–379)
RBC: 4.97 x10E6/uL (ref 4.14–5.80)
RDW: 12.9 % (ref 12.3–15.4)
WBC: 6.7 10*3/uL (ref 3.4–10.8)

## 2017-04-25 LAB — PROTIME-INR
INR: 1 (ref 0.8–1.2)
PROTHROMBIN TIME: 10.7 s (ref 9.1–12.0)

## 2017-04-28 ENCOUNTER — Telehealth: Payer: Self-pay

## 2017-04-28 ENCOUNTER — Ambulatory Visit: Payer: Medicare Other | Admitting: Neurology

## 2017-04-28 NOTE — Telephone Encounter (Signed)
Patient contacted pre-catheterization at [facility] scheduled for:  04/29/2017 @ 1330 Verified arrival time and place:  NT @ 1130 Confirmed AM meds to be taken pre-cath with sip of water: Take ASA Confirmed patient has responsible person to drive home post procedure and observe patient for 24 hours:  yes Addl concerns:  None-daughters will be driving

## 2017-04-29 ENCOUNTER — Encounter (HOSPITAL_COMMUNITY)
Admission: AD | Disposition: A | Discharge status: Home / Self Care | Payer: Self-pay | Source: Ambulatory Visit | Attending: Interventional Cardiology

## 2017-04-29 ENCOUNTER — Inpatient Hospital Stay (HOSPITAL_COMMUNITY)
Admission: AD | Admit: 2017-04-29 | Discharge: 2017-05-02 | DRG: 247 | Disposition: A | Discharge status: Home / Self Care | Payer: Medicare Other | Source: Ambulatory Visit | Attending: Interventional Cardiology | Admitting: Interventional Cardiology

## 2017-04-29 DIAGNOSIS — I472 Ventricular tachycardia: Secondary | ICD-10-CM | POA: Diagnosis present

## 2017-04-29 DIAGNOSIS — Z23 Encounter for immunization: Secondary | ICD-10-CM

## 2017-04-29 DIAGNOSIS — K5790 Diverticulosis of intestine, part unspecified, without perforation or abscess without bleeding: Secondary | ICD-10-CM | POA: Diagnosis present

## 2017-04-29 DIAGNOSIS — I209 Angina pectoris, unspecified: Secondary | ICD-10-CM | POA: Diagnosis not present

## 2017-04-29 DIAGNOSIS — Z86010 Personal history of colonic polyps: Secondary | ICD-10-CM | POA: Diagnosis not present

## 2017-04-29 DIAGNOSIS — I252 Old myocardial infarction: Secondary | ICD-10-CM

## 2017-04-29 DIAGNOSIS — Z800 Family history of malignant neoplasm of digestive organs: Secondary | ICD-10-CM

## 2017-04-29 DIAGNOSIS — M1990 Unspecified osteoarthritis, unspecified site: Secondary | ICD-10-CM | POA: Diagnosis present

## 2017-04-29 DIAGNOSIS — Z8042 Family history of malignant neoplasm of prostate: Secondary | ICD-10-CM

## 2017-04-29 DIAGNOSIS — I255 Ischemic cardiomyopathy: Secondary | ICD-10-CM | POA: Diagnosis present

## 2017-04-29 DIAGNOSIS — R079 Chest pain, unspecified: Secondary | ICD-10-CM | POA: Diagnosis present

## 2017-04-29 DIAGNOSIS — R011 Cardiac murmur, unspecified: Secondary | ICD-10-CM | POA: Diagnosis present

## 2017-04-29 DIAGNOSIS — R9431 Abnormal electrocardiogram [ECG] [EKG]: Secondary | ICD-10-CM | POA: Diagnosis not present

## 2017-04-29 DIAGNOSIS — I25119 Atherosclerotic heart disease of native coronary artery with unspecified angina pectoris: Secondary | ICD-10-CM | POA: Diagnosis not present

## 2017-04-29 DIAGNOSIS — I2510 Atherosclerotic heart disease of native coronary artery without angina pectoris: Secondary | ICD-10-CM

## 2017-04-29 DIAGNOSIS — Z7982 Long term (current) use of aspirin: Secondary | ICD-10-CM | POA: Diagnosis not present

## 2017-04-29 DIAGNOSIS — I25110 Atherosclerotic heart disease of native coronary artery with unstable angina pectoris: Secondary | ICD-10-CM | POA: Diagnosis not present

## 2017-04-29 DIAGNOSIS — E782 Mixed hyperlipidemia: Secondary | ICD-10-CM | POA: Diagnosis not present

## 2017-04-29 DIAGNOSIS — Z955 Presence of coronary angioplasty implant and graft: Secondary | ICD-10-CM

## 2017-04-29 DIAGNOSIS — Z801 Family history of malignant neoplasm of trachea, bronchus and lung: Secondary | ICD-10-CM | POA: Diagnosis not present

## 2017-04-29 DIAGNOSIS — Z8546 Personal history of malignant neoplasm of prostate: Secondary | ICD-10-CM

## 2017-04-29 DIAGNOSIS — I4729 Other ventricular tachycardia: Secondary | ICD-10-CM

## 2017-04-29 DIAGNOSIS — Z880 Allergy status to penicillin: Secondary | ICD-10-CM | POA: Diagnosis not present

## 2017-04-29 DIAGNOSIS — I25118 Atherosclerotic heart disease of native coronary artery with other forms of angina pectoris: Secondary | ICD-10-CM | POA: Diagnosis not present

## 2017-04-29 HISTORY — DX: Atherosclerotic heart disease of native coronary artery without angina pectoris: I25.10

## 2017-04-29 HISTORY — PX: LEFT HEART CATH AND CORONARY ANGIOGRAPHY: CATH118249

## 2017-04-29 HISTORY — DX: Personal history of other diseases of the digestive system: Z87.19

## 2017-04-29 HISTORY — DX: Cardiac murmur, unspecified: R01.1

## 2017-04-29 HISTORY — DX: Angina pectoris, unspecified: I20.9

## 2017-04-29 HISTORY — DX: Gastro-esophageal reflux disease without esophagitis: K21.9

## 2017-04-29 LAB — CBC
HCT: 42.4 % (ref 39.0–52.0)
HEMOGLOBIN: 14.7 g/dL (ref 13.0–17.0)
MCH: 31.7 pg (ref 26.0–34.0)
MCHC: 34.7 g/dL (ref 30.0–36.0)
MCV: 91.4 fL (ref 78.0–100.0)
PLATELETS: 124 10*3/uL — AB (ref 150–400)
RBC: 4.64 MIL/uL (ref 4.22–5.81)
RDW: 12.7 % (ref 11.5–15.5)
WBC: 5 10*3/uL (ref 4.0–10.5)

## 2017-04-29 LAB — CREATININE, SERUM
CREATININE: 0.85 mg/dL (ref 0.61–1.24)
GFR calc non Af Amer: >60 mL/min (ref >60)

## 2017-04-29 SURGERY — LEFT HEART CATH AND CORONARY ANGIOGRAPHY
Anesthesia: LOCAL

## 2017-04-29 MED ORDER — LIDOCAINE HCL 2 % IJ SOLN
INTRAMUSCULAR | Status: AC
  Filled 2017-04-29: qty 10

## 2017-04-29 MED ORDER — MIDAZOLAM HCL 2 MG/2ML IJ SOLN
INTRAMUSCULAR | Status: DC | PRN
  Administered 2017-04-29: 1 mg via INTRAVENOUS

## 2017-04-29 MED ORDER — ENOXAPARIN SODIUM 80 MG/0.8ML ~~LOC~~ SOLN: 1.0000 mg/kg | Freq: Two times a day (BID) | SUBCUTANEOUS | Status: DC

## 2017-04-29 MED ORDER — ISOSORBIDE MONONITRATE ER 60 MG PO TB24
60.0000 mg | ORAL_TABLET | Freq: Every day | ORAL | Status: DC
  Administered 2017-04-30 – 2017-05-02 (×3): 60 mg via ORAL
  Filled 2017-04-29 (×4): qty 1

## 2017-04-29 MED ORDER — LEVOTHYROXINE SODIUM 50 MCG PO TABS
50.0000 ug | ORAL_TABLET | Freq: Every day | ORAL | Status: DC
  Administered 2017-04-30 – 2017-05-02 (×3): 50 ug via ORAL
  Filled 2017-04-29 (×3): qty 1

## 2017-04-29 MED ORDER — SODIUM CHLORIDE 0.9% FLUSH: 3.0000 mL | Freq: Two times a day (BID) | INTRAVENOUS | Status: DC

## 2017-04-29 MED ORDER — FENTANYL CITRATE (PF) 100 MCG/2ML IJ SOLN
INTRAMUSCULAR | Status: AC
  Filled 2017-04-29: qty 2

## 2017-04-29 MED ORDER — SODIUM CHLORIDE 0.9 % IV SOLN: INTRAVENOUS | Status: AC

## 2017-04-29 MED ORDER — HEPARIN (PORCINE) IN NACL 2-0.9 UNIT/ML-% IJ SOLN
INTRAMUSCULAR | Status: AC | PRN
  Administered 2017-04-29: 1000 mL

## 2017-04-29 MED ORDER — IOPAMIDOL (ISOVUE-370) INJECTION 76%
INTRAVENOUS | Status: AC
  Filled 2017-04-29: qty 100

## 2017-04-29 MED ORDER — HEPARIN (PORCINE) IN NACL 2-0.9 UNIT/ML-% IJ SOLN
INTRAMUSCULAR | Status: DC | PRN
  Administered 2017-04-29: 10 mL via INTRA_ARTERIAL

## 2017-04-29 MED ORDER — ACETAMINOPHEN 325 MG PO TABS: 650.0000 mg | ORAL_TABLET | ORAL | Status: DC | PRN

## 2017-04-29 MED ORDER — SODIUM CHLORIDE 0.9 % IV SOLN: 250.0000 mL | INTRAVENOUS | Status: DC | PRN

## 2017-04-29 MED ORDER — HEPARIN (PORCINE) IN NACL 2-0.9 UNIT/ML-% IJ SOLN
INTRAMUSCULAR | Status: AC
  Filled 2017-04-29: qty 1000

## 2017-04-29 MED ORDER — OXYCODONE HCL 5 MG PO TABS: 5.0000 mg | ORAL_TABLET | ORAL | Status: DC | PRN

## 2017-04-29 MED ORDER — ASPIRIN 81 MG PO CHEW: 81.0000 mg | CHEWABLE_TABLET | ORAL | Status: DC

## 2017-04-29 MED ORDER — SODIUM CHLORIDE 0.9% FLUSH: 3.0000 mL | INTRAVENOUS | Status: DC | PRN

## 2017-04-29 MED ORDER — CARVEDILOL 3.125 MG PO TABS
3.1250 mg | ORAL_TABLET | Freq: Two times a day (BID) | ORAL | Status: DC
  Administered 2017-05-01 – 2017-05-02 (×2): 3.125 mg via ORAL
  Filled 2017-04-29 (×4): qty 1

## 2017-04-29 MED ORDER — ATORVASTATIN CALCIUM 80 MG PO TABS
80.0000 mg | ORAL_TABLET | Freq: Every day | ORAL | Status: DC
  Administered 2017-04-29 – 2017-05-01 (×3): 80 mg via ORAL
  Filled 2017-04-29 (×3): qty 1

## 2017-04-29 MED ORDER — ENOXAPARIN SODIUM 80 MG/0.8ML ~~LOC~~ SOLN
1.0000 mg/kg | Freq: Once | SUBCUTANEOUS | Status: AC
  Administered 2017-04-30: 80 mg via SUBCUTANEOUS
  Filled 2017-04-29: qty 0.8

## 2017-04-29 MED ORDER — IOPAMIDOL (ISOVUE-370) INJECTION 76%
INTRAVENOUS | Status: DC | PRN
  Administered 2017-04-29: 90 mL via INTRA_ARTERIAL

## 2017-04-29 MED ORDER — FENTANYL CITRATE (PF) 100 MCG/2ML IJ SOLN
INTRAMUSCULAR | Status: DC | PRN
  Administered 2017-04-29: 50 ug via INTRAVENOUS

## 2017-04-29 MED ORDER — LIDOCAINE HCL (PF) 1 % IJ SOLN
INTRAMUSCULAR | Status: DC | PRN
  Administered 2017-04-29: 1 mL via SUBCUTANEOUS

## 2017-04-29 MED ORDER — HEPARIN SODIUM (PORCINE) 1000 UNIT/ML IJ SOLN
INTRAMUSCULAR | Status: DC | PRN
  Administered 2017-04-29: 4000 [IU] via INTRAVENOUS

## 2017-04-29 MED ORDER — NITROGLYCERIN 0.4 MG SL SUBL
0.4000 mg | SUBLINGUAL_TABLET | SUBLINGUAL | Status: DC | PRN
  Administered 2017-04-30 (×2): 0.4 mg via SUBLINGUAL
  Filled 2017-04-29: qty 1

## 2017-04-29 MED ORDER — ASPIRIN 81 MG PO CHEW: 81.0000 mg | CHEWABLE_TABLET | Freq: Every day | ORAL | Status: DC

## 2017-04-29 MED ORDER — VERAPAMIL HCL 2.5 MG/ML IV SOLN
INTRAVENOUS | Status: AC
  Filled 2017-04-29: qty 2

## 2017-04-29 MED ORDER — SODIUM CHLORIDE 0.9 % WEIGHT BASED INFUSION: 1.0000 mL/kg/h | INTRAVENOUS | Status: DC

## 2017-04-29 MED ORDER — ASPIRIN EC 81 MG PO TBEC
81.0000 mg | DELAYED_RELEASE_TABLET | Freq: Every day | ORAL | Status: DC
  Administered 2017-04-30 – 2017-05-02 (×2): 81 mg via ORAL
  Filled 2017-04-29 (×2): qty 1

## 2017-04-29 MED ORDER — CLOPIDOGREL BISULFATE 75 MG PO TABS
300.0000 mg | ORAL_TABLET | Freq: Once | ORAL | Status: AC
  Administered 2017-04-29: 300 mg via ORAL
  Filled 2017-04-29: qty 4

## 2017-04-29 MED ORDER — HEPARIN SODIUM (PORCINE) 1000 UNIT/ML IJ SOLN
INTRAMUSCULAR | Status: AC
  Filled 2017-04-29: qty 1

## 2017-04-29 MED ORDER — HEPARIN SODIUM (PORCINE) 5000 UNIT/ML IJ SOLN: 5000.0000 [IU] | Freq: Three times a day (TID) | INTRAMUSCULAR | Status: DC

## 2017-04-29 MED ORDER — CLOPIDOGREL BISULFATE 75 MG PO TABS
75.0000 mg | ORAL_TABLET | Freq: Every day | ORAL | Status: DC
  Administered 2017-04-30 – 2017-05-02 (×2): 75 mg via ORAL
  Filled 2017-04-29 (×2): qty 1

## 2017-04-29 MED ORDER — ONDANSETRON HCL 4 MG/2ML IJ SOLN
4.0000 mg | Freq: Four times a day (QID) | INTRAMUSCULAR | Status: DC | PRN
  Administered 2017-04-30: 4 mg via INTRAVENOUS
  Filled 2017-04-29: qty 2

## 2017-04-29 MED ORDER — SODIUM CHLORIDE 0.9 % WEIGHT BASED INFUSION
3.0000 mL/kg/h | INTRAVENOUS | Status: DC
  Administered 2017-04-29: 3 mL/kg/h via INTRAVENOUS

## 2017-04-29 MED ORDER — MIDAZOLAM HCL 2 MG/2ML IJ SOLN
INTRAMUSCULAR | Status: AC
  Filled 2017-04-29: qty 2

## 2017-04-29 SURGICAL SUPPLY — 14 items
CATH INFINITI 5 FR JL3.5 (CATHETERS) ×2 IMPLANT
CATH INFINITI JR4 5F (CATHETERS) ×2 IMPLANT
CATH LAUNCHER 5F RADR (CATHETERS) ×1 IMPLANT
CATHETER LAUNCHER 5F RADR (CATHETERS) ×2
COVER PRB 48X5XTLSCP FOLD TPE (BAG) ×1 IMPLANT
COVER PROBE 5X48 (BAG) ×1
DEVICE RAD COMP TR BAND LRG (VASCULAR PRODUCTS) ×2 IMPLANT
GLIDESHEATH SLEND A-KIT 6F 22G (SHEATH) ×2 IMPLANT
GUIDEWIRE INQWIRE 1.5J.035X260 (WIRE) ×1 IMPLANT
INQWIRE 1.5J .035X260CM (WIRE) ×2
KIT HEART LEFT (KITS) ×2 IMPLANT
PACK CARDIAC CATHETERIZATION (CUSTOM PROCEDURE TRAY) ×2 IMPLANT
TRANSDUCER W/STOPCOCK (MISCELLANEOUS) ×2 IMPLANT
TUBING CIL FLEX 10 FLL-RA (TUBING) ×2 IMPLANT

## 2017-04-29 NOTE — Interval H&P Note (Signed)
Cath Lab Visit (complete for each Cath Lab visit)  Clinical Evaluation Leading to the Procedure:   ACS: No.  Non-ACS:    Anginal Classification: CCS Wright  Anti-ischemic medical therapy: Minimal Therapy (1 class of medications)  Non-Invasive Test Results: High-risk stress test findings: cardiac mortality >3%/year  Prior CABG: No previous CABG      History and Physical Interval Note:  04/29/2017 4:42 PM  Evan Wright  has presented today for surgery, with the diagnosis of Chest Pain   The various methods of treatment have been discussed with the patient and family. After consideration of risks, benefits and other options for treatment, the patient has consented to  Procedure(s): LEFT HEART CATH AND CORONARY ANGIOGRAPHY (N/A) as a surgical intervention .  The patient's history has been reviewed, patient examined, no change in status, stable for surgery.  I have reviewed the patient's chart and labs.  Questions were answered to the patient's satisfaction.     Evan Wright

## 2017-04-29 NOTE — Progress Notes (Signed)
ANTICOAGULATION CONSULT NOTE - Initial Consult  Pharmacy Consult for lovenox Indication: chest pain/ACS  Allergies  Allergen Reactions  . Penicillins Other (See Comments)    unknown    Patient Measurements: Height: 5\' 11"  (180.3 cm) Weight: 176 lb (79.8 kg) IBW/kg (Calculated) : 75.3  Vital Signs: Temp: 97.6 F (36.4 C) (10/09 1758) Temp Source: Oral (10/09 1758) BP: 162/77 (10/09 1758) Pulse Rate: 59 (10/09 1718)  Labs: No results for input(s): HGB, HCT, PLT, APTT, LABPROT, INR, HEPARINUNFRC, HEPRLOWMOCWT, CREATININE, CKTOTAL, CKMB, TROPONINI in the last 72 hours.  Estimated Creatinine Clearance: 52.8 mL/min (by C-G formula based on SCr of 0.99 mg/dL).   Medical History: Past Medical History:  Diagnosis Date  . Adenomatous colon polyp   . Diverticulosis   . Hyperlipidemia   . Osteoarthritis   . Prostate cancer [facility])     Assessment: 83 year old male admitted with chest pain. Patient with severe CAD, cardiology considering PCI of LAD if medical management is not adequate. Plavix and Lovenox ordered.   TR placed at 530pm, will plan on starting lovenox 8 hours after sheath removed. Renal function is normal even with age of 57, patient qualifies for q 12 hour dosing will need to watch for bleeding closely.   Goal of Therapy:  Anti-Xa level 0.6-1 units/ml 4hrs after LMWH dose given Monitor platelets by anticoagulation protocol: Yes   Plan:  Lovenox 80mg  q12 hours - start at 0100 CBC in am then q72 hours while on lovenox  Erin Hearing PharmD., BCPS Clinical Pharmacist Pager [phone] 04/29/2017 6:46 PM

## 2017-04-29 NOTE — H&P (View-Only) (Signed)
Cardiology Office Note:    Date:  04/24/2017   ID:  Evan Wright, DOB Apr 15, 1925, MRN [id number]  PCP:  Carollee Herter, Alferd Apa, DO  Cardiologist:  Jenean Lindau, MD   Referring MD: Carollee Herter, Alferd Apa, *    ASSESSMENT:    1. Angina pectoris ([place])   2. Ischemic cardiomyopathy   3. Mixed dyslipidemia   4. Nonsustained ventricular tachycardia (HCC)    PLAN:    In order of problems listed above:  1. I discussed coronary angiography and left heart catheterization with the patient at extensive length. Procedure, benefits and potential risks were explained. Patient had multiple questions which were answered to the patient's satisfaction. Patient agreed and consented for the procedure. Further recommendations will be made based on the findings of the coronary angiography. In the interim. The patient has any significant symptoms he knows to go to the nearest emergency room. 2. The patient and family took extensive time to understand the Process of coronary angiography and had multiple questions and I made sure that I explained to them including the risks in view of his advanced age and they're agreeable. 3. The test has been [facility] for October [facility]   Medication Adjustments/Labs and Tests Ordered: Current medicines are reviewed at length with the patient today.  Concerns regarding medicines are outlined above.  Orders Placed This Encounter  Procedures  . DG Chest 2 View  . Basic metabolic panel  . Protime-INR  . CBC with Differential/Platelet   No orders of the defined types were placed in this encounter.    Chief Complaint  Patient presents with  . Follow-up  . Chest Pain  . Fatigue     History of Present Illness:    Evan Wright is a 83 y.o. male . The patient called our office and mentioning that he's been having substernal chest tightness relieved with nitroglycerin. These symptoms are offering fairly consistently on exertion and this is concerned the  family. Therefore I called the patient for evaluation. His symptoms are very consistent for angina on exertion and it is to be noted that he's had a significant abnormal stress test and now a Holter monitor. At the time of my evaluation is alert awake oriented and in no distress  Past Medical History:  Diagnosis Date  . Adenomatous colon polyp   . Diverticulosis   . Hyperlipidemia   . Osteoarthritis   . Prostate cancer [facility])     Past Surgical History:  Procedure Laterality Date  . APPENDECTOMY  1948  . COLONOSCOPY    . EYE SURGERY  2012   march and April  --Dr [name]  . PROSTATECTOMY    . TONSILLECTOMY      Current Medications: Current Meds  Medication Sig  . aspirin EC 81 MG tablet Take 1 tablet (81 mg total) by mouth daily.  [name] atorvastatin (LIPITOR) 10 MG tablet Take 1 tablet (10 mg total) by mouth daily.  . Calcium Carbonate (CALTRATE 600) 1500 MG TABS Take 1,200 mg by mouth daily.   . carvedilol (COREG) 3.125 MG tablet Take 1 tablet (3.125 mg total) by mouth 2 (two) times daily.  . Cholecalciferol (VITAMIN D PO) Take 2,000 Units by mouth daily.   [name] levothyroxine (SYNTHROID, LEVOTHROID) 50 MCG tablet Take 1 tablet (50 mcg total) by mouth daily.  . Multiple Vitamins-Minerals (EYE VITAMINS) CAPS Take 1 capsule by mouth daily.  . nitroGLYCERIN (NITROSTAT) 0.4 MG SL tablet Place 1 tablet (0.4  mg total) under the tongue every 5 (five) minutes as needed for chest pain.  [name] Psyllium (METAMUCIL PO) Take by mouth daily.     Allergies:   Penicillins   Social History   Social History  . Marital status: Married    Spouse name: N/A  . Number of children: 2  . Years of education: N/A   Occupational History  . retired Retired   Social History Main Topics  . Smoking status: Never Smoker  . Smokeless tobacco: Never Used  . Alcohol use No  . Drug use: No  . Sexual activity: Not Asked   Other Topics Concern  . None   Social History Narrative  . None     Family  History: The patient's family history includes Colitis in his mother; Colon cancer in his brother; Lung cancer in his father; Prostate cancer in his father.  ROS:   Please see the history of present illness.    All other systems reviewed and are negative.  EKGs/Labs/Other Studies Reviewed:    The following studies were reviewed today: I reviewed the findings of the stress test with the patient at extensive length and he verbalized understanding   Recent Labs: 06/11/2016: Hemoglobin 15.9; Platelets 210.0 01/07/2017: TSH 1.32 04/03/2017: ALT 19; BUN 13; Creatinine, Ser 0.99; Potassium 4.4; Sodium 142  Recent Lipid Panel    Component Value Date/Time   CHOL 208 (H) 04/03/2017 1002   TRIG 184 (H) 04/03/2017 1002   HDL 42 04/03/2017 1002   CHOLHDL 5.0 04/03/2017 1002   CHOLHDL 5 06/11/2016 1111   VLDL 37.2 06/11/2016 1111   LDLCALC 129 (H) 04/03/2017 1002   LDLDIRECT 165.3 10/08/2010 1008    Physical Exam:    VS:  BP (!) 130/32   Pulse 64   Ht 6' (1.829 m)   Wt 173 lb 1.3 oz (78.5 kg)   SpO2 97%   BMI 23.47 kg/m     Wt Readings from Last 3 Encounters:  04/24/17 173 lb 1.3 oz (78.5 kg)  04/10/17 175 lb 1.9 oz (79.4 kg)  04/08/17 176 lb (79.8 kg)     GEN: Patient is in no acute distress HEENT: Normal NECK: No JVD; No carotid bruits LYMPHATICS: No lymphadenopathy CARDIAC: Hear sounds regular, 2/6 systolic murmur at the apex. RESPIRATORY:  Clear to auscultation without rales, wheezing or rhonchi  ABDOMEN: Soft, non-tender, non-distended MUSCULOSKELETAL:  No edema; No deformity  SKIN: Warm and dry NEUROLOGIC:  Alert and oriented x 3 PSYCHIATRIC:  Normal affect   Signed, Jenean Lindau, MD  04/24/2017 11:44 AM    [place]

## 2017-04-30 ENCOUNTER — Ambulatory Visit: Payer: Medicare Other | Admitting: Cardiology

## 2017-04-30 ENCOUNTER — Other Ambulatory Visit: Payer: Self-pay

## 2017-04-30 ENCOUNTER — Encounter (HOSPITAL_COMMUNITY): Payer: Self-pay | Admitting: Interventional Cardiology

## 2017-04-30 DIAGNOSIS — I209 Angina pectoris, unspecified: Secondary | ICD-10-CM

## 2017-04-30 DIAGNOSIS — I472 Ventricular tachycardia: Secondary | ICD-10-CM

## 2017-04-30 DIAGNOSIS — R9431 Abnormal electrocardiogram [ECG] [EKG]: Secondary | ICD-10-CM

## 2017-04-30 LAB — CBC
HEMATOCRIT: 41 % (ref 39.0–52.0)
HEMOGLOBIN: 13.3 g/dL (ref 13.0–17.0)
MCH: 30 pg (ref 26.0–34.0)
MCHC: 32.4 g/dL (ref 30.0–36.0)
MCV: 92.3 fL (ref 78.0–100.0)
Platelets: 147 10*3/uL — ABNORMAL LOW (ref 150–400)
RBC: 4.44 MIL/uL (ref 4.22–5.81)
RDW: 12.4 % (ref 11.5–15.5)
WBC: 5.7 10*3/uL (ref 4.0–10.5)

## 2017-04-30 LAB — BASIC METABOLIC PANEL
ANION GAP: 9 (ref 5–15)
BUN: 13 mg/dL (ref 6–20)
CALCIUM: 8.8 mg/dL — AB (ref 8.9–10.3)
CO2: 22 mmol/L (ref 22–32)
Chloride: 106 mmol/L (ref 101–111)
Creatinine, Ser: 0.83 mg/dL (ref 0.61–1.24)
GLUCOSE: 148 mg/dL — AB (ref 65–99)
POTASSIUM: 4 mmol/L (ref 3.5–5.1)
Sodium: 137 mmol/L (ref 135–145)

## 2017-04-30 LAB — HEPARIN LEVEL (UNFRACTIONATED): HEPARIN UNFRACTIONATED: 0.66 [IU]/mL (ref 0.30–0.70)

## 2017-04-30 LAB — PROTIME-INR
INR: 1.09
PROTHROMBIN TIME: 14 s (ref 11.4–15.2)

## 2017-04-30 MED ORDER — CLOPIDOGREL BISULFATE 75 MG PO TABS
75.0000 mg | ORAL_TABLET | ORAL | Status: AC
  Administered 2017-05-01: 75 mg via ORAL
  Filled 2017-04-30: qty 1

## 2017-04-30 MED ORDER — HEPARIN (PORCINE) IN NACL 100-0.45 UNIT/ML-% IJ SOLN
1000.0000 [IU]/h | INTRAMUSCULAR | Status: DC
  Administered 2017-04-30: 1100 [IU]/h via INTRAVENOUS
  Administered 2017-05-01: 1000 [IU]/h via INTRAVENOUS
  Filled 2017-04-30 (×2): qty 250

## 2017-04-30 MED ORDER — SODIUM CHLORIDE 0.9% FLUSH: 3.0000 mL | INTRAVENOUS | Status: DC | PRN

## 2017-04-30 MED ORDER — SODIUM CHLORIDE 0.9 % WEIGHT BASED INFUSION
3.0000 mL/kg/h | INTRAVENOUS | Status: DC
  Administered 2017-05-01: 3 mL/kg/h via INTRAVENOUS

## 2017-04-30 MED ORDER — SODIUM CHLORIDE 0.9% FLUSH
3.0000 mL | Freq: Two times a day (BID) | INTRAVENOUS | Status: DC
  Administered 2017-04-30 – 2017-05-01 (×2): 3 mL via INTRAVENOUS

## 2017-04-30 MED ORDER — ASPIRIN 81 MG PO CHEW
81.0000 mg | CHEWABLE_TABLET | ORAL | Status: AC
  Administered 2017-05-01: 81 mg via ORAL
  Filled 2017-04-30: qty 1

## 2017-04-30 MED ORDER — SODIUM CHLORIDE 0.9 % IV SOLN: 250.0000 mL | INTRAVENOUS | Status: DC | PRN

## 2017-04-30 MED ORDER — SODIUM CHLORIDE 0.9 % WEIGHT BASED INFUSION: 1.0000 mL/kg/h | INTRAVENOUS | Status: DC

## 2017-04-30 MED FILL — Lidocaine HCl Local Inj 2%: INTRAMUSCULAR | Qty: 10 | Status: AC

## 2017-04-30 NOTE — Progress Notes (Signed)
Heparin drip started as ordered.  Informed pt and family of NPO after midnight for pre op.  Idolina Primer, RN

## 2017-04-30 NOTE — Progress Notes (Signed)
ANTICOAGULATION CONSULT NOTE - Follow up Evan Wright for lovenox  switching to IV heparin drip Indication: chest pain/ACS  Allergies  Allergen Reactions  . Penicillins Other (See Comments)    unknown    Patient Measurements: Height: 5\' 11"  (180.3 cm) Weight: 168 lb 12.8 oz (76.6 kg) IBW/kg (Calculated) : 75.3  Heparin dosing weight = TBW = 76.6 kg  Vital Signs: Temp: 98.4 F (36.9 C) (10/10 1300) Temp Source: Oral (10/10 1300)  Labs:  Recent Labs  04/29/17 1942 04/30/17 0503 04/30/17 1859 04/30/17 2038  HGB 14.7 13.3  --   --   HCT 42.4 41.0  --   --   PLT 124* 147*  --   --   LABPROT  --   --  14.0  --   INR  --   --  1.09  --   HEPARINUNFRC  --   --   --  0.66  CREATININE 0.85 0.83  --   --     Estimated Creatinine Clearance: 63 mL/min (by C-G formula based on SCr of 0.83 mg/dL).   Medical History: Past Medical History:  Diagnosis Date  . Adenomatous colon polyp   . Angina pectoris ([place]) 04/2017  . Cardiac murmur   . Coronary artery disease   . Diverticulosis   . GERD (gastroesophageal reflux disease)   . History of hiatal hernia   . Hyperlipidemia   . Osteoarthritis   . Prostate cancer [facility])     Assessment: 83 year old male admitted with chest pain. Patient with severe CAD. Post Cath 10/9  Lovenox 1mg /kg sq q12h started, first and last dose given this AM @ 01:00.  Cardiology now switching to IV heparin infusion. Patient scheduled for stent on 10/11 @1200 . H/H wnl/stable, pltc improved to 147K<<124K.  No bleeding reported.  Heparin level this evening is therapeutic at 0.66.   Goal of Therapy:  Anti-Xa level 0.6-1 units/ml 4hrs after LMWH dose given Monitor platelets by anticoagulation protocol: Yes   Plan:  Continue IV heparin drip at 1100 units/hr  Follow up am level Heparin level and CBC daily   Thank you for allowing [place] to participate in this patients care.  Jens Som, PharmD Clinical phone for 04/30/2017 from  3:30-10:30p: [phone] If after 10:30p, please call main pharmacy at: [phone] 04/30/2017 9:06 PM

## 2017-04-30 NOTE — Plan of Care (Signed)
Problem: Physical Regulation: Goal: Ability to maintain clinical measurements within normal limits will improve Outcome: Progressing Pt SB w/ HR in 50's. Coreg held. Other VSS. Pt had one episode of CP and epigastric pain this AM w/  Nausea. Nitro administered X2 w/ some relief. Pt also felt improvement after belching. Zofran administered w/ some relief. Radial cath site w/o complication. Will continue to monitor

## 2017-04-30 NOTE — Progress Notes (Signed)
ANTICOAGULATION CONSULT NOTE - Initial Consult  Pharmacy Consult for lovenox  switching to IV heparin drip Indication: chest pain/ACS  Allergies  Allergen Reactions  . Penicillins Other (See Comments)    unknown    Patient Measurements: Height: 5\' 11"  (180.3 cm) Weight: 168 lb 12.8 oz (76.6 kg) IBW/kg (Calculated) : 75.3  Heparin dosing weight = TBW = 76.6 kg  Vital Signs: BP: 129/73 (10/10 0831) Pulse Rate: 54 (10/10 0831)  Labs:  Recent Labs  04/29/17 1942 04/30/17 0503  HGB 14.7 13.3  HCT 42.4 41.0  PLT 124* 147*  CREATININE 0.85 0.83    Estimated Creatinine Clearance: 63 mL/min (by C-G formula based on SCr of 0.83 mg/dL).   Medical History: Past Medical History:  Diagnosis Date  . Adenomatous colon polyp   . Diverticulosis   . Hyperlipidemia   . Osteoarthritis   . Prostate cancer [facility])     Assessment: 83 year old male admitted with chest pain. Patient with severe CAD, cardiology planning for  PCI of LAD today at 12noon. Post Cath 10/9  Lovenox 1mg /kg sq q12h started, first and last dose given this AM @ 01:00.  Cardiology now switching to IV heparin infusion. Patient scheduled for PCI within next hour unless procedure gets delayed.  H/H wnl/stable, pltc improved to 147K<<124K.  No bleeding reported.    Goal of Therapy:  Anti-Xa level 0.6-1 units/ml 4hrs after LMWH dose given Monitor platelets by anticoagulation protocol: Yes   Plan:  Lovenox discontinued, last given at 0100 04/30/17.  Start IV heparin drip now at rate 1100 units/hr  F/u post PCI today  Heparin level and CBC daily if heparin resumed post PCI.   Nicole Cella, [place] Clinical Pharmacist Pager: [phone] 8a-330p [phone] 330p-1030p phone [phone] or [phone] Main pharmacy [phone] 04/30/2017 11:02 AM

## 2017-04-30 NOTE — Progress Notes (Signed)
Progress Note  Patient Name: Evan Wright Date of Encounter: 04/30/2017  Primary Cardiologist: Revankar  Subjective   Postoperative day one diagnostic coronary angiogram via right radial approach by Dr. Tamala Julian yesterday. This demonstrated high-grade proximal LAD disease, ramus disease. He did have right to LAD collaterals with anteroapical wall motion abnormality.[name] His EF was 45% range. He did have chest pain this morning relieved with supplemental glycerin. He is currently pain-free.  Inpatient Medications    Scheduled Meds: . aspirin EC  81 mg Oral Daily  . atorvastatin  80 mg Oral q1800  . carvedilol  3.125 mg Oral BID  . clopidogrel  75 mg Oral Daily  . enoxaparin (LOVENOX) injection  1 mg/kg Subcutaneous BID  . isosorbide mononitrate  60 mg Oral Daily  . levothyroxine  50 mcg Oral Daily  . sodium chloride flush  3 mL Intravenous Q12H   Continuous Infusions: . sodium chloride     PRN Meds: sodium chloride, acetaminophen, nitroGLYCERIN, ondansetron (ZOFRAN) IV, oxyCODONE, sodium chloride flush   Vital Signs    Vitals:   04/29/17 2138 04/30/17 0420 04/30/17 0505 04/30/17 0831  BP: (!) 128/55 (!) 151/65 128/66 129/73  Pulse: (!) 56   (!) 54  Resp: 17 18 20    Temp: 97.9 F (36.6 C)     TempSrc: Oral     SpO2: 95%     Weight:  168 lb 12.8 oz (76.6 kg)    Height:        Intake/Output Summary (Last 24 hours) at 04/30/17 0917 Last data filed at 04/30/17 0116  Gross per 24 hour  Intake                0 ml  Output              675 ml  Net             -675 ml   Filed Weights   04/29/17 1135 04/30/17 0420  Weight: 176 lb (79.8 kg) 168 lb 12.8 oz (76.6 kg)    Telemetry    Sinus rhythm - Personally Reviewed  ECG    Sinus bradycardia 53 with anterolateral T-wave inversion - Personally Reviewed  Physical Exam   GEN: No acute distress.   Neck: No JVD Cardiac: RRR, no murmurs, rubs, or gallops.  Respiratory: Clear to auscultation bilaterally. GI: Soft,  nontender, non-distended  MS: No edema; No deformity. Neuro:  Nonfocal  Psych: Normal affect  extremities-right radial puncture site well-healed  Labs    Chemistry Recent Labs Lab 04/24/17 1201 04/29/17 1942 04/30/17 0503  NA 142  --  137  K 4.2  --  4.0  CL 103  --  106  CO2 23  --  22  GLUCOSE 162*  --  148*  BUN 17  --  13  CREATININE 0.99 0.85 0.83  CALCIUM 9.9  --  8.8*  GFRNONAA 67 >60 >60  GFRAA 77 >60 >60  ANIONGAP  --   --  9     Hematology Recent Labs Lab 04/24/17 1201 04/29/17 1942 04/30/17 0503  WBC 6.7 5.0 5.7  RBC 4.97 4.64 4.44  HGB 15.4 14.7 13.3  HCT 46.4 42.4 41.0  MCV 93 91.4 92.3  MCH 31.0 31.7 30.0  MCHC 33.2 34.7 32.4  RDW 12.9 12.7 12.4  PLT 215 124* 147*    Cardiac EnzymesNo results for input(s): TROPONINI in the last 168 hours. No results for input(s): TROPIPOC in the last 168 hours.  BNPNo results for input(s): BNP, PROBNP in the last 168 hours.   DDimer No results for input(s): DDIMER in the last 168 hours.   Radiology    No results found.  Cardiac Studies   2-D echocardiogram(04/08/17)  Study Conclusions  - Left ventricle: The cavity size was normal. Wall thickness was   normal. Systolic function was mildly to moderately reduced. The   estimated ejection fraction was in the range of 40% to 45%. There   is akinesis of the mid-apicalanteroseptal and apical myocardium.   Doppler parameters are consistent with abnormal left ventricular   relaxation (grade 1 diastolic dysfunction). Doppler parameters   are consistent with high ventricular filling pressure. - Aortic valve: There was trivial regurgitation. - Ascending aorta: The ascending aorta was mildly dilated. - Mitral valve: Calcified annulus. - Pulmonary arteries: Systolic pressure was mildly increased.  Impressions:  - Akinesis of the distal septum and apex with overall mild to   moderate LV dysfunction; mild diastolic dysfunction with elevated   LV filling  pressure; trace AI; mildly dilated ascending aorta;   mild TR with mildly elevated pulmonary pressure.  Myoview stress test (04/08/17)  Study Highlights     Nuclear stress EF: 32%.  There was no ST segment deviation noted during stress.  Defect 1: There is a large defect of severe severity present in the mid anterior, mid anteroseptal, apical anterior, apical septal, apical inferior, apical lateral and apex location.  Findings consistent with prior myocardial infarction.  This is a high risk study.  The left ventricular ejection fraction is moderately decreased (30-44%).   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  There is no significant reversible ischemia.  Moderate to severe reduction in left ventricular global systolic function.   Cardiac catheterization (04/29/17)  Conclusion    99% proximal to mid LAD, heavily calcified and thrombotic, with right to left collaterals.  Ramus intermedius with segmental 65-75% proximal narrowing and 99% mid to distal narrowing.  50% mid circumflex with 99% stenosis in small first obtuse marginal.  Dominant right coronary with 50% proximal narrowing. Third left ventricular branch, small with 95% stenosis. Right coronary supplies collaterals to the LAD.  Apical akinesis. EF estimated to be 45-50% by cath. Normal left ventricular end-diastolic pressure.  RECOMMENDATIONS:   Plavix started  Consider percutaneous revascularization of the LAD via the femoral approach. Right radial approach was very difficult due to marked tortuosity and difficulty with catheter control and advancement using 5 [place] equipment.  Advance medical therapy by adding long-acting nitrates. An alternative approach could be aggressive up titration of antianginal therapy and PCI if he fails to respond.     Patient Profile     83 y.o. male patient of Dr. Geraldo Pitter who has what sounds like an  out-of-hospital anterior wall myocardial infarction at the end of August. He has been having postinfarct angina. Echo confirmed reduced EF aforementioned malady in the LAD territory and a Myoview stress test showed scar in the LAD territory. Cardiac catheterization performed by Dr. Tamala Julian yesterday. Right radial approach revealed high-grade calcified proximal LAD disease with right to left collaterals, high-grade high first diagonal branch stenosis with distal anteroapical wall motion abnormality. While this does appear to be scar on Myoview stress test the patient does have nitroglycerin responsive angina and probably would benefit from proximal LAD intervention. This vessel is highly calcified fluoroscopically and I suspect debulking with diamondback orbital rotation arthrectomy prior to PCI and  drug-eluting stenting would be the best strategy. I will review this with my other colleagues. I discussed this with the patient and his family as well as Dr. Tamala Julian..  Assessment & Plan    1: Coronary artery disease-out of hospital anterior wall myocardial infarction and of August with post infarction angina, dense scar on Myoview stress testing and high-grade calcified segmental proximal LAD disease with right-to-left collaterals and an anteroapical wall motion. He is a Lovenox which I will switch to heparin. This diagnostic cath was right radial was somewhat difficult because of tortuosity and intervention would have to be done femorally. I think given his ongoing symptoms he probably still has residual viable myocardium and would benefit from revascularization. I do not think that he is a bypass candidate.  For questions or updates, please contact [place] Please consult [email or web address] for contact info under Cardiology/STEMI.      Angelina Sheriff, MD  04/30/2017, 9:17 AM

## 2017-05-01 ENCOUNTER — Encounter (HOSPITAL_COMMUNITY)
Admission: AD | Disposition: A | Discharge status: Home / Self Care | Payer: Self-pay | Source: Ambulatory Visit | Attending: Interventional Cardiology

## 2017-05-01 ENCOUNTER — Other Ambulatory Visit: Payer: Self-pay

## 2017-05-01 ENCOUNTER — Ambulatory Visit: Payer: Self-pay | Admitting: Podiatry

## 2017-05-01 DIAGNOSIS — I25118 Atherosclerotic heart disease of native coronary artery with other forms of angina pectoris: Secondary | ICD-10-CM

## 2017-05-01 HISTORY — PX: CORONARY STENT INTERVENTION: CATH118234

## 2017-05-01 LAB — HEPARIN LEVEL (UNFRACTIONATED): HEPARIN UNFRACTIONATED: 0.7 [IU]/mL (ref 0.30–0.70)

## 2017-05-01 LAB — CBC
HEMATOCRIT: 40.7 % (ref 39.0–52.0)
HEMOGLOBIN: 13.2 g/dL (ref 13.0–17.0)
MCH: 30.1 pg (ref 26.0–34.0)
MCHC: 32.4 g/dL (ref 30.0–36.0)
MCV: 92.9 fL (ref 78.0–100.0)
Platelets: 151 10*3/uL (ref 150–400)
RBC: 4.38 MIL/uL (ref 4.22–5.81)
RDW: 12.8 % (ref 11.5–15.5)
WBC: 7.4 10*3/uL (ref 4.0–10.5)

## 2017-05-01 LAB — POCT ACTIVATED CLOTTING TIME: Activated Clotting Time: 439 seconds

## 2017-05-01 SURGERY — CORONARY STENT INTERVENTION
Anesthesia: LOCAL

## 2017-05-01 MED ORDER — NITROGLYCERIN 1 MG/10 ML FOR IR/CATH LAB
INTRA_ARTERIAL | Status: AC
  Filled 2017-05-01: qty 10

## 2017-05-01 MED ORDER — SODIUM CHLORIDE 0.9 % WEIGHT BASED INFUSION
1.0000 mL/kg/h | INTRAVENOUS | Status: AC
  Administered 2017-05-01: 1 mL/kg/h via INTRAVENOUS

## 2017-05-01 MED ORDER — IOPAMIDOL (ISOVUE-370) INJECTION 76%
INTRAVENOUS | Status: AC
  Filled 2017-05-01: qty 100

## 2017-05-01 MED ORDER — SODIUM CHLORIDE 0.9% FLUSH
3.0000 mL | Freq: Two times a day (BID) | INTRAVENOUS | Status: DC
  Administered 2017-05-02: 3 mL via INTRAVENOUS

## 2017-05-01 MED ORDER — LIDOCAINE HCL 2 % IJ SOLN
INTRAMUSCULAR | Status: AC
  Filled 2017-05-01: qty 10

## 2017-05-01 MED ORDER — FENTANYL CITRATE (PF) 100 MCG/2ML IJ SOLN
INTRAMUSCULAR | Status: AC
  Filled 2017-05-01: qty 2

## 2017-05-01 MED ORDER — HEPARIN (PORCINE) IN NACL 2-0.9 UNIT/ML-% IJ SOLN
INTRAMUSCULAR | Status: AC | PRN
  Administered 2017-05-01: 1000 mL

## 2017-05-01 MED ORDER — HYDRALAZINE HCL 20 MG/ML IJ SOLN: 5.0000 mg | INTRAMUSCULAR | Status: AC | PRN

## 2017-05-01 MED ORDER — ATROPINE SULFATE 1 MG/10ML IJ SOSY
PREFILLED_SYRINGE | INTRAMUSCULAR | Status: AC
  Filled 2017-05-01: qty 10

## 2017-05-01 MED ORDER — SODIUM CHLORIDE 0.9 % IV SOLN: 250.0000 mL | INTRAVENOUS | Status: DC | PRN

## 2017-05-01 MED ORDER — NITROGLYCERIN 1 MG/10 ML FOR IR/CATH LAB
INTRA_ARTERIAL | Status: DC | PRN
  Administered 2017-05-01: 200 ug via INTRACORONARY

## 2017-05-01 MED ORDER — MIDAZOLAM HCL 2 MG/2ML IJ SOLN
INTRAMUSCULAR | Status: DC | PRN
  Administered 2017-05-01: 1 mg via INTRAVENOUS

## 2017-05-01 MED ORDER — LIDOCAINE HCL 2 % IJ SOLN
INTRAMUSCULAR | Status: DC | PRN
  Administered 2017-05-01: 15 mL

## 2017-05-01 MED ORDER — IOPAMIDOL (ISOVUE-370) INJECTION 76%
INTRAVENOUS | Status: DC | PRN
  Administered 2017-05-01: 170 mL via INTRA_ARTERIAL

## 2017-05-01 MED ORDER — MIDAZOLAM HCL 2 MG/2ML IJ SOLN
INTRAMUSCULAR | Status: AC
  Filled 2017-05-01: qty 2

## 2017-05-01 MED ORDER — BIVALIRUDIN TRIFLUOROACETATE 250 MG IV SOLR
INTRAVENOUS | Status: AC
  Filled 2017-05-01: qty 250

## 2017-05-01 MED ORDER — SODIUM CHLORIDE 0.9% FLUSH: 3.0000 mL | INTRAVENOUS | Status: DC | PRN

## 2017-05-01 MED ORDER — BIVALIRUDIN BOLUS VIA INFUSION - CUPID
INTRAVENOUS | Status: DC | PRN
  Administered 2017-05-01: 57.825 mg via INTRAVENOUS

## 2017-05-01 MED ORDER — SODIUM CHLORIDE 0.9 % IV SOLN
INTRAVENOUS | Status: DC | PRN
  Administered 2017-05-01 (×2): 1.75 mg/kg/h via INTRAVENOUS

## 2017-05-01 MED ORDER — FENTANYL CITRATE (PF) 100 MCG/2ML IJ SOLN
INTRAMUSCULAR | Status: DC | PRN
Start: 2017-05-01 — End: 2017-05-01
  Administered 2017-05-01: 25 ug via INTRAVENOUS

## 2017-05-01 SURGICAL SUPPLY — 25 items
BALLN EUPHORA RX 2.5X15 (BALLOONS) ×2
BALLN EUPHORA RX 2.5X20 (BALLOONS) ×2
BALLN WOLVERINE 2.50X10 (BALLOONS) ×2
BALLN ~~LOC~~ EUPHORA RX 3.0X12 (BALLOONS) ×2
BALLN ~~LOC~~ EUPHORA RX 3.0X15 (BALLOONS) ×2
BALLOON EUPHORA RX 2.5X15 (BALLOONS) ×1 IMPLANT
BALLOON EUPHORA RX 2.5X20 (BALLOONS) ×1 IMPLANT
BALLOON WOLVERINE 2.50X10 (BALLOONS) ×1 IMPLANT
BALLOON ~~LOC~~ EUPHORA RX 3.0X12 (BALLOONS) ×1 IMPLANT
BALLOON ~~LOC~~ EUPHORA RX 3.0X15 (BALLOONS) ×1 IMPLANT
CATH GUIDEZILLA II 6F (CATHETERS) ×1 IMPLANT
CATH VISTA GUIDE 6FR XBLAD4 (CATHETERS) ×2 IMPLANT
CATHETER GUIDEZILLA II 6F (CATHETERS) ×2
DEVICE CONTINUOUS FLUSH (MISCELLANEOUS) ×2 IMPLANT
KIT ENCORE 26 ADVANTAGE (KITS) ×2 IMPLANT
KIT ESSENTIALS PG (KITS) ×2 IMPLANT
KIT HEART LEFT (KITS) ×2 IMPLANT
PACK CARDIAC CATHETERIZATION (CUSTOM PROCEDURE TRAY) ×2 IMPLANT
SHEATH PINNACLE 6F 10CM (SHEATH) ×2 IMPLANT
STENT RESOLUTE ONYX 3.0X22 (Permanent Stent) ×2 IMPLANT
STENT SIERRA 3.00 X 15 MM (Permanent Stent) ×2 IMPLANT
TRANSDUCER W/STOPCOCK (MISCELLANEOUS) ×2 IMPLANT
TUBING CIL FLEX 10 FLL-RA (TUBING) ×2 IMPLANT
WIRE ASAHI PROWATER 180CM (WIRE) ×2 IMPLANT
WIRE MAILMAN 182CM (WIRE) ×2 IMPLANT

## 2017-05-01 NOTE — Progress Notes (Signed)
ANTICOAGULATION CONSULT NOTE  Pharmacy Consult for: heparin infusion Indication: chest pain/ACS  Allergies  Allergen Reactions  . Penicillins Other (See Comments)    unknown    Patient Measurements: Height: 5\' 11"  (180.3 cm) Weight: 170 lb (77.1 kg) IBW/kg (Calculated) : 75.3  Heparin dosing weight: 77 kg  Vital Signs: Temp: 98 F (36.7 C) (10/11 0544) Temp Source: Oral (10/11 0544) BP: 134/64 (10/11 0544) Pulse Rate: 56 (10/11 0544)  Labs:  Recent Labs  04/29/17 1942 04/30/17 0503 04/30/17 1859 04/30/17 2038 05/01/17 0534  HGB 14.7 13.3  --   --  13.2  HCT 42.4 41.0  --   --  40.7  PLT 124* 147*  --   --  151  LABPROT  --   --  14.0  --   --   INR  --   --  1.09  --   --   HEPARINUNFRC  --   --   --  0.66 0.70  CREATININE 0.85 0.83  --   --   --     Estimated Creatinine Clearance: 63 mL/min (by C-G formula based on SCr of 0.83 mg/dL).   Medical History: Past Medical History:  Diagnosis Date  . Adenomatous colon polyp   . Angina pectoris ([place]) 04/2017  . Cardiac murmur   . Coronary artery disease   . Diverticulosis   . GERD (gastroesophageal reflux disease)   . History of hiatal hernia   . Hyperlipidemia   . Osteoarthritis   . Prostate cancer [facility])     Assessment: 83 year old male admitted with chest pain. Patient with severe CAD, pt was admitted for scheduled diagnostic cath on 10/9. He had this cath which showed disease in the proximal LAD. Planning for another cath today via different access for possible PCI. Remains on heparin gtt in the meantime. Heparin level was on high end of therapeutic range. CBC and SCr are stable.   Goal of Therapy:  Heparin dosing weight: 0.3-0.7 units/mL Monitor platelets by anticoagulation protocol: Yes   Plan:  -Reduce heparin to 1000 units/hr -Daily HL, CBC -F/u after cath today    Hughes Better, PharmD, BCPS Clinical Pharmacist 05/01/2017 8:32 AM

## 2017-05-01 NOTE — H&P (View-Only) (Signed)
Progress Note  Patient Name: Evan Wright Date of Encounter: 05/01/2017  Primary Cardiologist: Revankar  Subjective   Postoperative day 2 diagnostic coronary angiogram via right radial approach by Dr. Tamala Julian . This demonstrated high-grade proximal LAD disease, ramus disease. He did have right to LAD collaterals with anteroapical wall motion abnormality.[name] His EF was 45% range. He did have chest pain yesterday morning relieved with supplemental glycerin. He is currently pain-free n IV heparin. He is scheduled for PCI and drug-eluting stenting of his proximal LAD and ramus branch by Dr. [name] today.  Inpatient Medications    Scheduled Meds: . aspirin EC  81 mg Oral Daily  . atorvastatin  80 mg Oral q1800  . carvedilol  3.125 mg Oral BID  . clopidogrel  75 mg Oral Daily  . isosorbide mononitrate  60 mg Oral Daily  . levothyroxine  50 mcg Oral Daily  . sodium chloride flush  3 mL Intravenous Q12H   Continuous Infusions: . sodium chloride    . sodium chloride 1 mL/kg/hr (05/01/17 0734)  . heparin 1,000 Units/hr (05/01/17 0843)   PRN Meds: sodium chloride, acetaminophen, nitroGLYCERIN, ondansetron (ZOFRAN) IV, oxyCODONE, sodium chloride flush   Vital Signs    Vitals:   04/30/17 1300 04/30/17 2223 05/01/17 0544 05/01/17 0853  BP:  104/64 134/64 (!) 164/65  Pulse:  60 (!) 56   Resp: 18 (!) 22 (!) 23 13  Temp: 98.4 F (36.9 C) 97.8 F (36.6 C) 98 F (36.7 C)   TempSrc: Oral Oral Oral   SpO2: 96% 99% 98%   Weight:   170 lb (77.1 kg)   Height:        Intake/Output Summary (Last 24 hours) at 05/01/17 1001 Last data filed at 05/01/17 0400  Gross per 24 hour  Intake            412.7 ml  Output              450 ml  Net            -37.3 ml   Filed Weights   04/29/17 1135 04/30/17 0420 05/01/17 0544  Weight: 176 lb (79.8 kg) 168 lb 12.8 oz (76.6 kg) 170 lb (77.1 kg)    Telemetry    Sinus rhythm - Personally Reviewed  ECG    Sinus bradycardia 53 with anterolateral  T-wave inversion - Personally Reviewed  Physical Exam   GEN: No acute distress.   Neck: No JVD Cardiac: RRR, no murmurs, rubs, or gallops.  Respiratory: Clear to auscultation bilaterally. GI: Soft, nontender, non-distended  MS: No edema; No deformity. Neuro:  Nonfocal  Psych: Normal affect  extremities-right radial puncture site well-healed  Labs    Chemistry  Recent Labs Lab 04/24/17 1201 04/29/17 1942 04/30/17 0503  NA 142  --  137  K 4.2  --  4.0  CL 103  --  106  CO2 23  --  22  GLUCOSE 162*  --  148*  BUN 17  --  13  CREATININE 0.99 0.85 0.83  CALCIUM 9.9  --  8.8*  GFRNONAA 67 >60 >60  GFRAA 77 >60 >60  ANIONGAP  --   --  9     Hematology  Recent Labs Lab 04/29/17 1942 04/30/17 0503 05/01/17 0534  WBC 5.0 5.7 7.4  RBC 4.64 4.44 4.38  HGB 14.7 13.3 13.2  HCT 42.4 41.0 40.7  MCV 91.4 92.3 92.9  MCH 31.7 30.0 30.1  MCHC 34.7 32.4 32.4  RDW 12.7 12.4 12.8  PLT 124* 147* 151    Cardiac EnzymesNo results for input(s): TROPONINI in the last 168 hours. No results for input(s): TROPIPOC in the last 168 hours.   BNPNo results for input(s): BNP, PROBNP in the last 168 hours.   DDimer No results for input(s): DDIMER in the last 168 hours.   Radiology    No results found.  Cardiac Studies   2-D echocardiogram(04/08/17)  Study Conclusions  - Left ventricle: The cavity size was normal. Wall thickness was   normal. Systolic function was mildly to moderately reduced. The   estimated ejection fraction was in the range of 40% to 45%. There   is akinesis of the mid-apicalanteroseptal and apical myocardium.   Doppler parameters are consistent with abnormal left ventricular   relaxation (grade 1 diastolic dysfunction). Doppler parameters   are consistent with high ventricular filling pressure. - Aortic valve: There was trivial regurgitation. - Ascending aorta: The ascending aorta was mildly dilated. - Mitral valve: Calcified annulus. - Pulmonary  arteries: Systolic pressure was mildly increased.  Impressions:  - Akinesis of the distal septum and apex with overall mild to   moderate LV dysfunction; mild diastolic dysfunction with elevated   LV filling pressure; trace AI; mildly dilated ascending aorta;   mild TR with mildly elevated pulmonary pressure.  Myoview stress test (04/08/17)  Study Highlights     Nuclear stress EF: 32%.  There was no ST segment deviation noted during stress.  Defect 1: There is a large defect of severe severity present in the mid anterior, mid anteroseptal, apical anterior, apical septal, apical inferior, apical lateral and apex location.  Findings consistent with prior myocardial infarction.  This is a high risk study.  The left ventricular ejection fraction is moderately decreased (30-44%).   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  There is no significant reversible ischemia.  Moderate to severe reduction in left ventricular global systolic function.   Cardiac catheterization (04/29/17)  Conclusion    99% proximal to mid LAD, heavily calcified and thrombotic, with right to left collaterals.  Ramus intermedius with segmental 65-75% proximal narrowing and 99% mid to distal narrowing.  50% mid circumflex with 99% stenosis in small first obtuse marginal.  Dominant right coronary with 50% proximal narrowing. Third left ventricular branch, small with 95% stenosis. Right coronary supplies collaterals to the LAD.  Apical akinesis. EF estimated to be 45-50% by cath. Normal left ventricular end-diastolic pressure.  RECOMMENDATIONS:   Plavix started  Consider percutaneous revascularization of the LAD via the femoral approach. Right radial approach was very difficult due to marked tortuosity and difficulty with catheter control and advancement using 5 [place] equipment.  Advance medical therapy by adding long-acting  nitrates. An alternative approach could be aggressive up titration of antianginal therapy and PCI if he fails to respond.     Patient Profile     83 y.o. male patient of Dr. Geraldo Pitter who has what sounds like an out-of-hospital anterior wall myocardial infarction at the end of August. He has been having postinfarct angina. Echo confirmed reduced EF aforementioned malady in the LAD territory and a Myoview stress test showed scar in the LAD territory. Cardiac catheterization performed by Dr. Tamala Julian yesterday. Right radial approach revealed high-grade calcified proximal LAD disease with right to left collaterals, high-grade high first diagonal branch stenosis with distal anteroapical wall motion abnormality. While this does appear to be scar on Myoview stress  test the patient does have nitroglycerin responsive angina and probably would benefit from proximal LAD intervention. This vessel is highly calcified fluoroscopically and I suspect debulking with diamondback orbital rotation arthrectomy prior to PCI and drug-eluting stenting would be the best strategy. I will review this with my other colleagues. I discussed this with the patient and his family as well as Dr. Tamala Julian..  Assessment & Plan    1: Coronary artery disease-out of hospital anterior wall myocardial infarction and of August with post infarction angina, dense scar on Myoview stress testing and high-grade calcified segmental proximal LAD disease with right-to-left collaterals and an anteroapical wall motion. He is a Lovenox which I will switch to heparin. This diagnostic cath was performed 2 days ago via the right radial approach andwas somewhat difficult because of tortuosity.  The intervention would have to be done femorally. I think given his ongoing symptoms he probably still has residual viable myocardium and would benefit from revascularization. I do not think that he is a bypass candidate. He has been a symptomatic since yesterday.He is  scheduled for intervention today.  For questions or updates, please contact [place] Please consult [email or web address] for contact info under Cardiology/STEMI.      Angelina Sheriff, MD  05/01/2017, 10:01 AM

## 2017-05-01 NOTE — Care Management Note (Addendum)
Case Management Note  Patient Details  Name: TEAGUE GOYNES MRN: [id number] Date of Birth: Sep 15, 1924  Subjective/Objective:     From home with wife, has a cane and a walker at home but does not really use them, S/p coronary stent intervention, will be on plavix.  He states he will not need any HH services.              Action/Plan: NCM will follow for dc needs.  Expected Discharge Date:                  Expected Discharge Plan:  Home/Self Care  In-House Referral:     Discharge planning Services  CM Consult  Post Acute Care Choice:    Choice offered to:     DME Arranged:    DME Agency:     HH Arranged:    [place] Agency:     Status of Service:  Completed, signed off  If discussed at [employer] of Stay Meetings, dates discussed:    Additional Comments:  Zenon Mayo, RN 05/01/2017, 4:17 PM

## 2017-05-01 NOTE — Progress Notes (Signed)
Progress Note  Patient Name: Evan Wright Date of Encounter: 05/01/2017  Primary Cardiologist: Revankar  Subjective   Postoperative day 2 diagnostic coronary angiogram via right radial approach by Dr. Tamala Julian . This demonstrated high-grade proximal LAD disease, ramus disease. He did have right to LAD collaterals with anteroapical wall motion abnormality.Evan Wright His EF was 45% range. He did have chest pain yesterday morning relieved with supplemental glycerin. He is currently pain-free n IV heparin. He is scheduled for PCI and drug-eluting stenting of his proximal LAD and ramus branch by Dr. [name] today.  Inpatient Medications    Scheduled Meds: . aspirin EC  81 mg Oral Daily  . atorvastatin  80 mg Oral q1800  . carvedilol  3.125 mg Oral BID  . clopidogrel  75 mg Oral Daily  . isosorbide mononitrate  60 mg Oral Daily  . levothyroxine  50 mcg Oral Daily  . sodium chloride flush  3 mL Intravenous Q12H   Continuous Infusions: . sodium chloride    . sodium chloride 1 mL/kg/hr (05/01/17 0734)  . heparin 1,000 Units/hr (05/01/17 0843)   PRN Meds: sodium chloride, acetaminophen, nitroGLYCERIN, ondansetron (ZOFRAN) IV, oxyCODONE, sodium chloride flush   Vital Signs    Vitals:   04/30/17 1300 04/30/17 2223 05/01/17 0544 05/01/17 0853  BP:  104/64 134/64 (!) 164/65  Pulse:  60 (!) 56   Resp: 18 (!) 22 (!) 23 13  Temp: 98.4 F (36.9 C) 97.8 F (36.6 C) 98 F (36.7 C)   TempSrc: Oral Oral Oral   SpO2: 96% 99% 98%   Weight:   170 lb (77.1 kg)   Height:        Intake/Output Summary (Last 24 hours) at 05/01/17 1001 Last data filed at 05/01/17 0400  Gross per 24 hour  Intake            412.7 ml  Output              450 ml  Net            -37.3 ml   Filed Weights   04/29/17 1135 04/30/17 0420 05/01/17 0544  Weight: 176 lb (79.8 kg) 168 lb 12.8 oz (76.6 kg) 170 lb (77.1 kg)    Telemetry    Sinus rhythm - Personally Reviewed  ECG    Sinus bradycardia 53 with anterolateral  T-wave inversion - Personally Reviewed  Physical Exam   GEN: No acute distress.   Neck: No JVD Cardiac: RRR, no murmurs, rubs, or gallops.  Respiratory: Clear to auscultation bilaterally. GI: Soft, nontender, non-distended  MS: No edema; No deformity. Neuro:  Nonfocal  Psych: Normal affect  extremities-right radial puncture site well-healed  Labs    Chemistry  Recent Labs Lab 04/24/17 1201 04/29/17 1942 04/30/17 0503  NA 142  --  137  K 4.2  --  4.0  CL 103  --  106  CO2 23  --  22  GLUCOSE 162*  --  148*  BUN 17  --  13  CREATININE 0.99 0.85 0.83  CALCIUM 9.9  --  8.8*  GFRNONAA 67 >60 >60  GFRAA 77 >60 >60  ANIONGAP  --   --  9     Hematology  Recent Labs Lab 04/29/17 1942 04/30/17 0503 05/01/17 0534  WBC 5.0 5.7 7.4  RBC 4.64 4.44 4.38  HGB 14.7 13.3 13.2  HCT 42.4 41.0 40.7  MCV 91.4 92.3 92.9  MCH 31.7 30.0 30.1  MCHC 34.7 32.4 32.4  RDW 12.7 12.4 12.8  PLT 124* 147* 151    Cardiac EnzymesNo results for input(s): TROPONINI in the last 168 hours. No results for input(s): TROPIPOC in the last 168 hours.   BNPNo results for input(s): BNP, PROBNP in the last 168 hours.   DDimer No results for input(s): DDIMER in the last 168 hours.   Radiology    No results found.  Cardiac Studies   2-D echocardiogram(04/08/17)  Study Conclusions  - Left ventricle: The cavity size was normal. Wall thickness was   normal. Systolic function was mildly to moderately reduced. The   estimated ejection fraction was in the range of 40% to 45%. There   is akinesis of the mid-apicalanteroseptal and apical myocardium.   Doppler parameters are consistent with abnormal left ventricular   relaxation (grade 1 diastolic dysfunction). Doppler parameters   are consistent with high ventricular filling pressure. - Aortic valve: There was trivial regurgitation. - Ascending aorta: The ascending aorta was mildly dilated. - Mitral valve: Calcified annulus. - Pulmonary  arteries: Systolic pressure was mildly increased.  Impressions:  - Akinesis of the distal septum and apex with overall mild to   moderate LV dysfunction; mild diastolic dysfunction with elevated   LV filling pressure; trace AI; mildly dilated ascending aorta;   mild TR with mildly elevated pulmonary pressure.  Myoview stress test (04/08/17)  Study Highlights     Nuclear stress EF: 32%.  There was no ST segment deviation noted during stress.  Defect 1: There is a large defect of severe severity present in the mid anterior, mid anteroseptal, apical anterior, apical septal, apical inferior, apical lateral and apex location.  Findings consistent with prior myocardial infarction.  This is a high risk study.  The left ventricular ejection fraction is moderately decreased (30-44%).   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  There is no significant reversible ischemia.  Moderate to severe reduction in left ventricular global systolic function.   Cardiac catheterization (04/29/17)  Conclusion    99% proximal to mid LAD, heavily calcified and thrombotic, with right to left collaterals.  Ramus intermedius with segmental 65-75% proximal narrowing and 99% mid to distal narrowing.  50% mid circumflex with 99% stenosis in small first obtuse marginal.  Dominant right coronary with 50% proximal narrowing. Third left ventricular branch, small with 95% stenosis. Right coronary supplies collaterals to the LAD.  Apical akinesis. EF estimated to be 45-50% by cath. Normal left ventricular end-diastolic pressure.  RECOMMENDATIONS:   Plavix started  Consider percutaneous revascularization of the LAD via the femoral approach. Right radial approach was very difficult due to marked tortuosity and difficulty with catheter control and advancement using 5 [place] equipment.  Advance medical therapy by adding long-acting  nitrates. An alternative approach could be aggressive up titration of antianginal therapy and PCI if he fails to respond.     Patient Profile     83 y.o. male patient of Dr. Geraldo Pitter who has what sounds like an out-of-hospital anterior wall myocardial infarction at the end of August. He has been having postinfarct angina. Echo confirmed reduced EF aforementioned malady in the LAD territory and a Myoview stress test showed scar in the LAD territory. Cardiac catheterization performed by Dr. Tamala Julian yesterday. Right radial approach revealed high-grade calcified proximal LAD disease with right to left collaterals, high-grade high first diagonal branch stenosis with distal anteroapical wall motion abnormality. While this does appear to be scar on Myoview stress  test the patient does have nitroglycerin responsive angina and probably would benefit from proximal LAD intervention. This vessel is highly calcified fluoroscopically and I suspect debulking with diamondback orbital rotation arthrectomy prior to PCI and drug-eluting stenting would be the best strategy. I will review this with my other colleagues. I discussed this with the patient and his family as well as Dr. Tamala Julian..  Assessment & Plan    1: Coronary artery disease-out of hospital anterior wall myocardial infarction and of August with post infarction angina, dense scar on Myoview stress testing and high-grade calcified segmental proximal LAD disease with right-to-left collaterals and an anteroapical wall motion. He is a Lovenox which I will switch to heparin. This diagnostic cath was performed 2 days ago via the right radial approach andwas somewhat difficult because of tortuosity.  The intervention would have to be done femorally. I think given his ongoing symptoms he probably still has residual viable myocardium and would benefit from revascularization. I do not think that he is a bypass candidate. He has been a symptomatic since yesterday.He is  scheduled for intervention today.  For questions or updates, please contact [place] Please consult [email or web address] for contact info under Cardiology/STEMI.      Angelina Sheriff, MD  05/01/2017, 10:01 AM

## 2017-05-01 NOTE — Interval H&P Note (Signed)
History and Physical Interval Note:  05/01/2017 12:40 PM  Evan Wright  has presented today for surgery, with the diagnosis of cad  The various methods of treatment have been discussed with the patient and family. After consideration of risks, benefits and other options for treatment, the patient has consented to  Procedure(s): CORONARY STENT INTERVENTION (N/A) as a surgical intervention .  The patient's history has been reviewed, patient examined, no change in status, stable for surgery.  I have reviewed the patient's chart and labs.  Questions were answered to the patient's satisfaction.   Cath Lab Visit (complete for each Cath Lab visit)  Clinical Evaluation Leading to the Procedure:   ACS: Yes.    Non-ACS:    Anginal Classification: CCS III  Anti-ischemic medical therapy: Maximal Therapy (2 or more classes of medications)  Non-Invasive Test Results: Intermediate-risk stress test findings: cardiac mortality 1-3%/year  Prior CABG: No previous CABG        Collier Salina [facility] 05/01/2017 12:40 PM

## 2017-05-01 NOTE — Progress Notes (Signed)
Site area: right groin  Site Prior to Removal:  Level 0  Pressure Applied For 20 MINUTES    Minutes Beginning at 1755  Manual:   Yes.    Patient Status During Pull:  AAO X 4  Post Pull Groin Site:  Level 0  Post Pull Instructions Given:  Yes.    Post Pull Pulses Present:  Yes.    Dressing Applied:  Yes.    Comments:  Tolerated procedure well

## 2017-05-01 NOTE — Plan of Care (Signed)
Problem: Physical Regulation: Goal: Ability to maintain clinical measurements within normal limits will improve Outcome: Progressing VSS throughout shift. Pt denies any CP, SOB, or Nausea. Site prep for cath. Pre cath medications administered. Pt NPO at this time. IV fluids running as ordered. Will continue to monitor.

## 2017-05-02 ENCOUNTER — Telehealth: Payer: Self-pay | Admitting: Cardiology

## 2017-05-02 ENCOUNTER — Encounter (HOSPITAL_COMMUNITY): Payer: Self-pay | Admitting: Cardiology

## 2017-05-02 LAB — CBC
HEMATOCRIT: 35.9 % — AB (ref 39.0–52.0)
Hemoglobin: 11.8 g/dL — ABNORMAL LOW (ref 13.0–17.0)
MCH: 30.6 pg (ref 26.0–34.0)
MCHC: 32.9 g/dL (ref 30.0–36.0)
MCV: 93 fL (ref 78.0–100.0)
Platelets: 147 10*3/uL — ABNORMAL LOW (ref 150–400)
RBC: 3.86 MIL/uL — ABNORMAL LOW (ref 4.22–5.81)
RDW: 12.7 % (ref 11.5–15.5)
WBC: 6.7 10*3/uL (ref 4.0–10.5)

## 2017-05-02 LAB — BASIC METABOLIC PANEL
Anion gap: 7 (ref 5–15)
BUN: 12 mg/dL (ref 6–20)
CALCIUM: 8.6 mg/dL — AB (ref 8.9–10.3)
CO2: 23 mmol/L (ref 22–32)
Chloride: 108 mmol/L (ref 101–111)
Creatinine, Ser: 0.91 mg/dL (ref 0.61–1.24)
GFR calc Af Amer: >60 mL/min (ref >60)
GLUCOSE: 130 mg/dL — AB (ref 65–99)
POTASSIUM: 4.1 mmol/L (ref 3.5–5.1)
Sodium: 138 mmol/L (ref 135–145)

## 2017-05-02 MED ORDER — INFLUENZA VAC SPLIT HIGH-DOSE 0.5 ML IM SUSY
0.5000 mL | PREFILLED_SYRINGE | INTRAMUSCULAR | Status: AC
  Administered 2017-05-02: 0.5 mL via INTRAMUSCULAR
  Filled 2017-05-02: qty 0.5

## 2017-05-02 MED ORDER — INFLUENZA VAC SPLIT HIGH-DOSE 0.5 ML IM SUSY: 0.5000 mL | PREFILLED_SYRINGE | INTRAMUSCULAR | Status: DC

## 2017-05-02 MED ORDER — ISOSORBIDE MONONITRATE ER 60 MG PO TB24: 60.0000 mg | ORAL_TABLET | Freq: Every day | ORAL | 6 refills | Status: DC

## 2017-05-02 MED ORDER — CLOPIDOGREL BISULFATE 75 MG PO TABS: 75.0000 mg | ORAL_TABLET | Freq: Every day | ORAL | 11 refills | Status: DC

## 2017-05-02 MED ORDER — ATORVASTATIN CALCIUM 80 MG PO TABS: 80.0000 mg | ORAL_TABLET | Freq: Every day | ORAL | 3 refills | Status: DC

## 2017-05-02 MED FILL — CLOPIDOGREL 75 MG TABLET: 75 | 30 days supply | Qty: 30 | Fill #0

## 2017-05-02 MED FILL — ATORVASTATIN 80 MG TABLET: 80 | 30 days supply | Qty: 30 | Fill #0

## 2017-05-02 MED FILL — ISOSORBIDE MN ER 60 MG TAB: 60 | 30 days supply | Qty: 30 | Fill #0

## 2017-05-02 NOTE — Telephone Encounter (Signed)
When attempting to call the patient could not get through to line as It was busy. May possibly be out of power will call back.

## 2017-05-02 NOTE — Telephone Encounter (Signed)
New message     TCM  Dr Lennox Pippins 05/08/17 10a

## 2017-05-02 NOTE — Telephone Encounter (Signed)
Patient contacted regarding discharge from [facility] on 05/02/2017.  Patient understands to follow up with provider Revankar on 05/08/2017 at 10:00 am at [facility]. Patient understands discharge instructions? Yes Patient understands medications and regiment? Yes, Plavix stressed to the daughter Patient understands to bring all medications to this visit? Yes

## 2017-05-02 NOTE — Progress Notes (Signed)
CARDIAC REHAB PHASE I   PRE:  Rate/Rhythm: 70 SR    BP: sitting 134/68    SaO2:   MODE:  Ambulation: 180 ft   POST:  Rate/Rhythm: 76 SR    BP: sitting150/63     SaO2: 98 RA  Pt is weak from bedrest. Able to stand slowly but wobbly. Used RW and I supported with gait belt. Had x1 assist to follow with w/c in case legs gave way. Pt with slow and steady walk with RW. Sts he feels better than he felt PTA. To recliner after walk. Ed completed with pt and wife. Good understanding of Plavix, restrictions, daily wts, MI/stents, low sodium, walking gl, NTG, and CRPII. Will refer to [place].  [id number]   [place], ACSM 05/02/2017 9:31 AM

## 2017-05-02 NOTE — Discharge Summary (Signed)
Discharge Summary    Patient ID: Evan Wright,  MRN: [id number], DOB/AGE: 1925/02/04 83 y.o.  Admit date: 04/29/2017 Discharge date: 05/02/2017  Primary Care Provider: Roma Schanz R Primary Cardiologist: Dr. Geraldo Pitter  Discharge Diagnoses    Principal Problem:   Angina pectoris [facility]) Active Problems:   CARDIAC MURMUR   Abnormal EKG   Nonsustained ventricular tachycardia (HCC)   CAD (coronary artery disease), native coronary artery   ICM   HLD  Allergies Allergies  Allergen Reactions  . Penicillins Other (See Comments)    unknown    Diagnostic Studies/Procedures    Cardiac catheterization (04/29/17)  Conclusion    99% proximal to mid LAD, heavily calcified and thrombotic, with right to left collaterals.  Ramus intermedius with segmental 65-75% proximal narrowing and 99% mid to distal narrowing.  50% mid circumflex with 99% stenosis in small first obtuse marginal.  Dominant right coronary with 50% proximal narrowing. Third left ventricular branch, small with 95% stenosis. Right coronary supplies collaterals to the LAD.  Apical akinesis. EF estimated to be 45-50% by cath. Normal left ventricular end-diastolic pressure.  RECOMMENDATIONS:   Plavix started  Consider percutaneous revascularization of the LAD via the femoral approach. Right radial approach was very difficult due to marked tortuosity and difficulty with catheter control and advancement using 5 [place] equipment.  Advance medical therapy by adding long-acting nitrates. An alternative approach could be aggressive up titration of antianginal therapy and PCI if he fails to respond.    CORONARY STENT INTERVENTION  05/01/17  Conclusion     99% proximal to mid LAD stenosis.  A STENT SIERRA 3.00 X 15 MM drug eluting stent was successfully placed.  A STENT RESOLUTE ONYX 3.0X22 drug-eluting stent was successfully placed.  Post intervention, there is a 0% residual stenosis.   1.  Successful stenting of the ostial to mid LAD with DES x 2.  Plan: DAPT for one year. Plan to treat other residual disease medically.    Diagnostic Diagram       Post-Intervention Diagram           History of Present Illness     83 y.o. male patient who recently evaluated by Dr. Salley Scarlet 04/02/17 for 2 weeks hx of substernal chest pain. Sounds like an out-of-hospital anterior wall myocardial infarction at the end of August. He has been having postinfarct angina. Outpatient Echo confirmed reduced EF aforementioned malady in the LAD territory and a Myoview stress test showed scar in the LAD territory. Now presenting for outpatient cath for unstable angina.   Hospital Course     Consultants: None  1. CAD - Cardiac catheterization performed by Dr. Tamala Julian 04/29/17 revealed high-grade calcified proximal LAD disease with right to left collaterals, high-grade high first diagonal branch stenosis with distal anteroapical wall motion abnormality. While this does appear to be scar on Myoview stress test the patient does have nitroglycerin responsive angina and probably would benefit from proximal LAD intervention. Lovenox switched to heparin. This diagnostic cath was right radial was somewhat difficult because of tortuosity and intervention would have to be done femorally. Recurrent chest pain relived with nitro.  - S/p Successful stenting of the ostial to mid LAD with DES x 2. No recurrent chest pain. Ambulated well.  - Continue ASA, Plavix, statin , imdur and BB.   2. ICM - Echo showed LVEF of 40-45% with WM abnormal. Now s/p PCI of LAD. Euvolemic. Continue BB. Consider adding ACE/ARB as outpatient.   3. HLD -  06/11/2016: VLDL 37.2 04/03/2017: Cholesterol, Total 208; HDL 42; LDL Calculated 129; Triglycerides 184  - Continue statin   The patient has been seen by Dr. Gwenlyn Found today and deemed ready for discharge home. All follow-up appointments have been scheduled. Discharge medications are  listed below.    Discharge Vitals Blood pressure 134/68, pulse 72, temperature 98 F (36.7 C), temperature source Oral, resp. rate 20, height 5\' 11"  (1.803 m), weight 170 lb (77.1 kg), SpO2 96 %.  Filed Weights   04/29/17 1135 04/30/17 0420 05/01/17 0544  Weight: 176 lb (79.8 kg) 168 lb 12.8 oz (76.6 kg) 170 lb (77.1 kg)   Physical Exam  Constitutional: He is oriented to person, place, and time and well-developed, well-nourished, and in no distress.  HENT:  Head: Normocephalic.  Eyes: Pupils are equal, round, and reactive to light.  Neck: Normal range of motion.  Cardiovascular: Normal rate and regular rhythm.   R groin cath site stable  Pulmonary/Chest: Effort normal and breath sounds normal.  Abdominal: Soft.  Musculoskeletal: Normal range of motion.  Neurological: He is alert and oriented to person, place, and time.  Skin: Skin is warm and dry.  Psychiatric: Affect normal.    Labs & Radiologic Studies     CBC  Recent Labs  05/01/17 0534 05/02/17 0350  WBC 7.4 6.7  HGB 13.2 11.8*  HCT 40.7 35.9*  MCV 92.9 93.0  PLT 151 161*   Basic Metabolic Panel  Recent Labs  04/30/17 0503 05/02/17 0350  NA 137 138  K 4.0 4.1  CL 106 108  CO2 22 23  GLUCOSE 148* 130*  BUN 13 12  CREATININE 0.83 0.91  CALCIUM 8.8* 8.6*    Dg Chest 2 View  Result Date: 04/24/2017 CLINICAL DATA:  Mid substernal chest pain one day ago. History of prostate cancer. EXAM: CHEST  2 VIEW COMPARISON:  None. FINDINGS: Unremarkable cardiac silhouette. Calcified tortuous aorta. No infiltrates or failure. No effusion or pneumothorax. Mild degenerative change thoracic spine IMPRESSION: No active disease. Electronically Signed   By: Staci Righter M.D.   On: 04/24/2017 15:49    Disposition   Pt is being discharged home today in good condition.  Follow-up Plans & Appointments    Follow-up Information    Revankar, Reita Cliche, MD. Go on 05/08/2017.   Specialty:  Cardiology Why:  @10am  for post  hosptial  Contact information: [address] [place] [address] [phone]          Discharge Instructions    AMB Referral to Cardiac Rehabilitation - Phase II    Complete by:  As directed    Diagnosis:  Coronary Stents   Amb Referral to Cardiac Rehabilitation    Complete by:  As directed    Diagnosis:   Coronary Stents PTCA NSTEMI     Diet - low sodium heart healthy    Complete by:  As directed    Discharge instructions    Complete by:  As directed    No driving for 72 hours.  No lifting over 5 lbs for or sexual activity until seen by MD. Keep procedure site clean & dry. If you notice increased pain, swelling, bleeding or pus, call/return!  You may shower, but no soaking baths/hot tubs/pools for 1 week.   Increase activity slowly    Complete by:  As directed       Discharge Medications   Current Discharge Medication List    START taking these medications  Details  clopidogrel (PLAVIX) 75 MG tablet Take 1 tablet (75 mg total) by mouth daily. Qty: 30 tablet, Refills: 11    isosorbide mononitrate (IMDUR) 60 MG 24 hr tablet Take 1 tablet (60 mg total) by mouth daily. Qty: 30 tablet, Refills: 6      CONTINUE these medications which have CHANGED   Details  atorvastatin (LIPITOR) 80 MG tablet Take 1 tablet (80 mg total) by mouth daily. Qty: 30 tablet, Refills: 3      CONTINUE these medications which have NOT CHANGED   Details  aspirin EC 81 MG tablet Take 1 tablet (81 mg total) by mouth daily. Qty: 30 tablet, Refills: 0    Calcium-Phosphorus-Vitamin D (CITRACAL +D3 PO) Take 1 tablet by mouth daily with lunch.    carvedilol (COREG) 3.125 MG tablet Take 1 tablet (3.125 mg total) by mouth 2 (two) times daily. Qty: 60 tablet, Refills: 11    cholecalciferol (VITAMIN D) 400 units TABS tablet Take 400 Units by mouth daily with lunch.    levothyroxine (SYNTHROID, LEVOTHROID) 50 MCG tablet Take 1 tablet (50 mcg total) by mouth daily. Qty: 90  tablet, Refills: 1    Multiple Vitamins-Minerals (ICAPS AREDS 2 PO) Take 1 tablet by mouth daily with lunch.    nitroGLYCERIN (NITROSTAT) 0.4 MG SL tablet Place 1 tablet (0.4 mg total) under the tongue every 5 (five) minutes as needed for chest pain. Qty: 25 tablet, Refills: 11    Psyllium (METAMUCIL PO) Take 1 Dose by mouth daily.          Aspirin prescribed at discharge?  Yes High Intensity Statin Prescribed? (Lipitor 40-80mg  or Crestor 20-40mg ): Yes Beta Blocker Prescribed? Yes For EF 45% or less, Was ACEI/ARB Prescribed? Plan to add as outpatient  ADP Receptor Inhibitor Prescribed? (i.e. Plavix etc.-Includes Medically Managed Patients): Yes For EF <40%, Aldosterone Inhibitor Prescribed? N/A Was EF assessed during THIS hospitalization? Yes Was Cardiac Rehab II ordered? (Included Medically managed Patients): Yes   Outstanding Labs/Studies   Lipid panel and LFTs in 4 weeks.   Duration of Discharge Encounter   Greater than 30 minutes including physician time.  Signed, Bhagat,Bhavinkumar PA-C 05/02/2017, 9:40 AM  Agree with note by Robbie Lis PA-C  Status post difficult proximal LAD PCI and drug-eluting stenting using overlapping stents. He is pain-free this morning his right femoral puncture site is well-healed. He is ambulatory without difficulty. He'll be discharged home this morning and follow-up with Dr. Lucie Leather, M.D., [place], [facility], [place] [address] [place], [place]  [address]  [phone] 05/02/2017 1:03 PM

## 2017-05-02 NOTE — Telephone Encounter (Signed)
Message left on daughters voice mail. 

## 2017-05-05 ENCOUNTER — Telehealth (HOSPITAL_COMMUNITY): Payer: Self-pay

## 2017-05-05 NOTE — Telephone Encounter (Signed)
Verified insurance. No co-payment, no deductible, out of pocket amount is $5,500/$758.61 has been met, no co-insurance, and no pre-authorization is required. Passport/reference # [id number]

## 2017-05-07 ENCOUNTER — Ambulatory Visit: Payer: Self-pay | Admitting: Podiatry

## 2017-05-08 ENCOUNTER — Ambulatory Visit (INDEPENDENT_AMBULATORY_CARE_PROVIDER_SITE_OTHER): Payer: Medicare Other | Admitting: Cardiology

## 2017-05-08 ENCOUNTER — Encounter: Payer: Self-pay | Admitting: Cardiology

## 2017-05-08 ENCOUNTER — Telehealth: Payer: Self-pay | Admitting: Family Medicine

## 2017-05-08 VITALS — BP 132/60 | HR 65 | Ht 71.0 in | Wt 176.1 lb

## 2017-05-08 DIAGNOSIS — I209 Angina pectoris, unspecified: Secondary | ICD-10-CM | POA: Diagnosis not present

## 2017-05-08 DIAGNOSIS — I2510 Atherosclerotic heart disease of native coronary artery without angina pectoris: Secondary | ICD-10-CM

## 2017-05-08 DIAGNOSIS — I255 Ischemic cardiomyopathy: Secondary | ICD-10-CM | POA: Diagnosis not present

## 2017-05-08 DIAGNOSIS — R9431 Abnormal electrocardiogram [ECG] [EKG]: Secondary | ICD-10-CM

## 2017-05-08 DIAGNOSIS — I472 Ventricular tachycardia: Secondary | ICD-10-CM | POA: Diagnosis not present

## 2017-05-08 DIAGNOSIS — I4729 Other ventricular tachycardia: Secondary | ICD-10-CM

## 2017-05-08 NOTE — Patient Instructions (Signed)
Medication Instructions:  Your physician recommends that you continue on your current medications as directed. Please refer to the Current Medication list given to you today.   Labwork: Your physician recommends that you have BMP, liver and lipid panel on 06/08/2017  Testing/Procedures: None  Follow-Up: 3 months  Any Other Special Instructions Will Be Listed Below (If Applicable).     If you need a refill on your cardiac medications before your next appointment, please call your pharmacy.

## 2017-05-08 NOTE — Addendum Note (Signed)
Addended by: Mattie Marlin on: 05/08/2017 10:56 AM   Modules accepted: Orders

## 2017-05-08 NOTE — Progress Notes (Signed)
Cardiology Office Note:    Date:  05/08/2017   ID:  [name], DOB 01-12-1925, MRN [id number]  PCP:  Carollee Herter, Alferd Apa, DO  Cardiologist:  Jenean Lindau, MD   Referring MD: Carollee Herter, Alferd Apa, *    ASSESSMENT:    1. Angina pectoris ([place])   2. Coronary artery disease involving native coronary artery of native heart without angina pectoris   3. Ischemic cardiomyopathy   4. Nonsustained ventricular tachycardia ([place])   5. Abnormal EKG    PLAN:    In order of problems listed above:  1. Coronary angiography report was discussed with the patient. Secondary prevention stressed. Importance of compliance with diet and medications stressed and he and his family were placed understanding. 2. His blood pressure is stable 3. Diet was discussed with dyslipidemia. He stent and has been increased to the intensity hydrostatic in one month for liver lipid check. 4. Patient will be seen in follow-up appointment in 3 months or earlier if the patient has any concerns.    Medication Adjustments/Labs and Tests Ordered: Current medicines are reviewed at length with the patient today.  Concerns regarding medicines are outlined above.  No orders of the defined types were placed in this encounter.  No orders of the defined types were placed in this encounter.    Chief Complaint  Patient presents with  . Follow-up    post Cath      History of Present Illness:    Evan Wright is a 83 y.o. male . The patient was evaluated by me for chest pain. He had significant episodes of recurrent angina for which she underwent coronary angiography and this revealed significant mid and proximal LAD stenosis for which the patient underwent drug-eluting stents. Subsequently his remarkably improved and denies any problems at this time. He is very happy. No chest pain orthopnea or PND. At the time of my evaluation he is alert awake oriented and in no distress.  Past Medical History:  Diagnosis Date    . Adenomatous colon polyp   . Angina pectoris ([place]) 04/2017  . CAD (coronary artery disease)    a. Cath 04/29/17-> high grade pLAD with R to L collaterals, high grade 1st dig  2. Cath 05/01/17 s/p ostial to mLAD with DES x 2.   . Cardiac murmur   . Coronary artery disease   . Diverticulosis   . GERD (gastroesophageal reflux disease)   . History of hiatal hernia   . Hyperlipidemia   . Osteoarthritis   . Prostate cancer [facility])     Past Surgical History:  Procedure Laterality Date  . APPENDECTOMY  1948  . COLONOSCOPY    . CORONARY STENT INTERVENTION N/A 05/01/2017   Procedure: CORONARY STENT INTERVENTION;  Surgeon: [name], [name], MD;  Location: [place] [facility];  Service: Cardiovascular;  Laterality: N/A;  . EYE SURGERY  2012   march and April  --Dr [name]  . LEFT HEART CATH AND CORONARY ANGIOGRAPHY N/A 04/29/2017   Procedure: LEFT HEART CATH AND CORONARY ANGIOGRAPHY;  Surgeon: [name], MD;  Location: [place] [facility];  Service: Cardiovascular;  Laterality: N/A;  . PROSTATECTOMY    . TONSILLECTOMY      Current Medications: Current Meds  Medication Sig  . aspirin EC 81 MG tablet Take 1 tablet (81 mg total) by mouth daily.  [name] atorvastatin (LIPITOR) 80 MG tablet Take 1 tablet (80 mg total) by mouth daily.  . Calcium-Phosphorus-Vitamin D (CITRACAL +D3  PO) Take 1 tablet by mouth daily with lunch.  . carvedilol (COREG) 3.125 MG tablet Take 1 tablet (3.125 mg total) by mouth 2 (two) times daily.  . cholecalciferol (VITAMIN D) 400 units TABS tablet Take 400 Units by mouth daily with lunch.  . clopidogrel (PLAVIX) 75 MG tablet Take 1 tablet (75 mg total) by mouth daily.  . isosorbide mononitrate (IMDUR) 60 MG 24 hr tablet Take 1 tablet (60 mg total) by mouth daily.  [name] levothyroxine (SYNTHROID, LEVOTHROID) 50 MCG tablet Take 1 tablet (50 mcg total) by mouth daily. (Patient taking differently: Take 50 mcg by mouth daily before breakfast. 30 minutes before a meal)  . Multiple  Vitamins-Minerals (ICAPS AREDS 2 PO) Take 1 tablet by mouth daily with lunch.  . nitroGLYCERIN (NITROSTAT) 0.4 MG SL tablet Place 1 tablet (0.4 mg total) under the tongue every 5 (five) minutes as needed for chest pain.  [name] Psyllium (METAMUCIL PO) Take 1 Dose by mouth daily.      Allergies:   Penicillins   Social History   Social History  . Marital status: Married    Spouse name: N/A  . Number of children: 2  . Years of education: N/A   Occupational History  . retired Retired   Social History Main Topics  . Smoking status: Never Smoker  . Smokeless tobacco: Never Used  . Alcohol use No  . Drug use: No  . Sexual activity: Not Asked   Other Topics Concern  . None   Social History Narrative  . None     Family History: The patient's family history includes Colitis in his mother; Colon cancer in his brother; Lung cancer in his father; Prostate cancer in his father.  ROS:   Please see the history of present illness.    All other systems reviewed and are negative.  EKGs/Labs/Other Studies Reviewed:    The following studies were reviewed today: I reviewed and discussed findings of the coronary angiography with the patient at extensive length and he vocalized understanding.   Recent Labs: 01/07/2017: TSH 1.32 04/03/2017: ALT 19 05/02/2017: BUN 12; Creatinine, Ser 0.91; Hemoglobin 11.8; Platelets 147; Potassium 4.1; Sodium 138  Recent Lipid Panel    Component Value Date/Time   CHOL 208 (H) 04/03/2017 1002   TRIG 184 (H) 04/03/2017 1002   HDL 42 04/03/2017 1002   CHOLHDL 5.0 04/03/2017 1002   CHOLHDL 5 06/11/2016 1111   VLDL 37.2 06/11/2016 1111   LDLCALC 129 (H) 04/03/2017 1002   LDLDIRECT 165.3 10/08/2010 1008    Physical Exam:    VS:  BP 132/60   Pulse 65   Ht 5\' 11"  (1.803 m)   Wt 176 lb 1.9 oz (79.9 kg)   SpO2 98%   BMI 24.56 kg/m     Wt Readings from Last 3 Encounters:  05/08/17 176 lb 1.9 oz (79.9 kg)  05/01/17 170 lb (77.1 kg)  04/24/17 173 lb 1.3  oz (78.5 kg)     GEN: Patient is in no acute distress HEENT: Normal NECK: No JVD; No carotid bruits LYMPHATICS: No lymphadenopathy CARDIAC: Hear sounds regular, 2/6 systolic murmur at the apex. RESPIRATORY:  Clear to auscultation without rales, wheezing or rhonchi  ABDOMEN: Soft, non-tender, non-distended MUSCULOSKELETAL:  No edema; No deformity  SKIN: Warm and dry NEUROLOGIC:  Alert and oriented x 3 PSYCHIATRIC:  Normal affect   Signed, Jenean Lindau, MD  05/08/2017 10:42 AM    [place]

## 2017-05-08 NOTE — Telephone Encounter (Signed)
Called patient in regards to Cardiac Rehab. He asked that we call back in a couple of days to recuperate. Will call back beginning of next week.

## 2017-05-08 NOTE — Telephone Encounter (Signed)
Pt dropped off document to be filled out by provider (Disability Parking Placard - 2 pages) Pt would like to be called at [phone] ([facility] tel phone [place]) to pick up document when ready. Document put at front office tray.

## 2017-05-09 NOTE — Telephone Encounter (Signed)
Received Application and Renewal of Disability Parking Placard form [place] [employer]; forward to provider/SLS 10/19

## 2017-05-12 ENCOUNTER — Ambulatory Visit: Payer: Medicare Other | Admitting: Cardiology

## 2017-05-13 DIAGNOSIS — H26492 Other secondary cataract, left eye: Secondary | ICD-10-CM | POA: Diagnosis not present

## 2017-05-14 NOTE — Telephone Encounter (Signed)
Attempted to call patient in regards to Cardiac Rehab - Left message on voicemail

## 2017-05-15 NOTE — Telephone Encounter (Signed)
Patient returned phone call in regards to Cardiac Rehab - patient stated he is not interested in the program due to his busy schedule.

## 2017-05-21 NOTE — Telephone Encounter (Signed)
Daughter Gaetana Michaelis [phone] would like a call back to see if Carollee Herter will consider completing another [place] [employer] Disability placard app since the other has not been returned to this practice via mail.

## 2017-05-22 ENCOUNTER — Ambulatory Visit: Payer: Medicare Other | Admitting: Cardiology

## 2017-05-22 NOTE — Telephone Encounter (Signed)
LMOM with contact name and number [for return call, if needed] RE: new Handicap Parking Placard has been forwarded to provider/SLS 11/01

## 2017-05-23 NOTE — Telephone Encounter (Signed)
LMOM with contact name and number RE: placard application ready for p/u; just need patient's signature and then copy to scan/SLS 11/02

## 2017-06-05 ENCOUNTER — Ambulatory Visit: Payer: Self-pay | Admitting: Podiatry

## 2017-06-06 ENCOUNTER — Other Ambulatory Visit: Payer: Self-pay

## 2017-06-06 MED ORDER — CARVEDILOL 3.125 MG PO TABS: 3.1250 mg | ORAL_TABLET | Freq: Two times a day (BID) | ORAL | 3 refills | Status: DC

## 2017-06-06 MED ORDER — CARVEDILOL 3.125 MG PO TABS: 3.1250 mg | ORAL_TABLET | Freq: Two times a day (BID) | ORAL | 1 refills | Status: DC

## 2017-06-09 DIAGNOSIS — I2510 Atherosclerotic heart disease of native coronary artery without angina pectoris: Secondary | ICD-10-CM | POA: Diagnosis not present

## 2017-06-10 LAB — BASIC METABOLIC PANEL
BUN / CREAT RATIO: 18 (ref 10–24)
BUN: 18 mg/dL (ref 10–36)
CALCIUM: 9.8 mg/dL (ref 8.6–10.2)
CO2: 22 mmol/L (ref 20–29)
CREATININE: 0.99 mg/dL (ref 0.76–1.27)
Chloride: 108 mmol/L — ABNORMAL HIGH (ref 96–106)
GFR calc Af Amer: 77 mL/min/{1.73_m2} (ref >59)
GFR calc non Af Amer: 67 mL/min/{1.73_m2} (ref >59)
GLUCOSE: 138 mg/dL — AB (ref 65–99)
Potassium: 4.9 mmol/L (ref 3.5–5.2)
Sodium: 145 mmol/L — ABNORMAL HIGH (ref 134–144)

## 2017-06-10 LAB — HEPATIC FUNCTION PANEL
ALBUMIN: 4.5 g/dL (ref 3.2–4.6)
ALK PHOS: 68 IU/L (ref 39–117)
ALT: 29 IU/L (ref 0–44)
AST: 24 IU/L (ref 0–40)
BILIRUBIN TOTAL: 0.7 mg/dL (ref 0.0–1.2)
BILIRUBIN, DIRECT: 0.17 mg/dL (ref 0.00–0.40)
TOTAL PROTEIN: 7.2 g/dL (ref 6.0–8.5)

## 2017-06-10 LAB — LIPID PANEL
CHOL/HDL RATIO: 2.5 ratio (ref 0.0–5.0)
CHOLESTEROL TOTAL: 115 mg/dL (ref 100–199)
HDL: 46 mg/dL (ref >39)
LDL CALC: 51 mg/dL (ref 0–99)
Triglycerides: 91 mg/dL (ref 0–149)
VLDL CHOLESTEROL CAL: 18 mg/dL (ref 5–40)

## 2017-06-19 ENCOUNTER — Ambulatory Visit: Payer: Self-pay | Admitting: Podiatry

## 2017-06-25 ENCOUNTER — Other Ambulatory Visit: Payer: Self-pay | Admitting: Family Medicine

## 2017-07-03 ENCOUNTER — Ambulatory Visit: Payer: Self-pay | Admitting: Podiatry

## 2017-07-03 ENCOUNTER — Other Ambulatory Visit: Payer: Self-pay

## 2017-07-03 MED ORDER — ISOSORBIDE MONONITRATE ER 60 MG PO TB24: 60.0000 mg | ORAL_TABLET | Freq: Every day | ORAL | 1 refills | Status: DC

## 2017-07-03 MED ORDER — ATORVASTATIN CALCIUM 80 MG PO TABS: 80.0000 mg | ORAL_TABLET | Freq: Every day | ORAL | 1 refills | Status: DC

## 2017-07-03 MED ORDER — CLOPIDOGREL BISULFATE 75 MG PO TABS: 75.0000 mg | ORAL_TABLET | Freq: Every day | ORAL | 1 refills | Status: DC

## 2017-08-03 IMAGING — DX DG LUMBAR SPINE COMPLETE 4+V
5 series · 5 of 5 positions shown · non-contrast
Comparison: None.

CLINICAL DATA: Left pain with bilateral radicular symptoms

EXAM:
LUMBAR SPINE - COMPLETE 4+ VIEW

[l-spine ap]
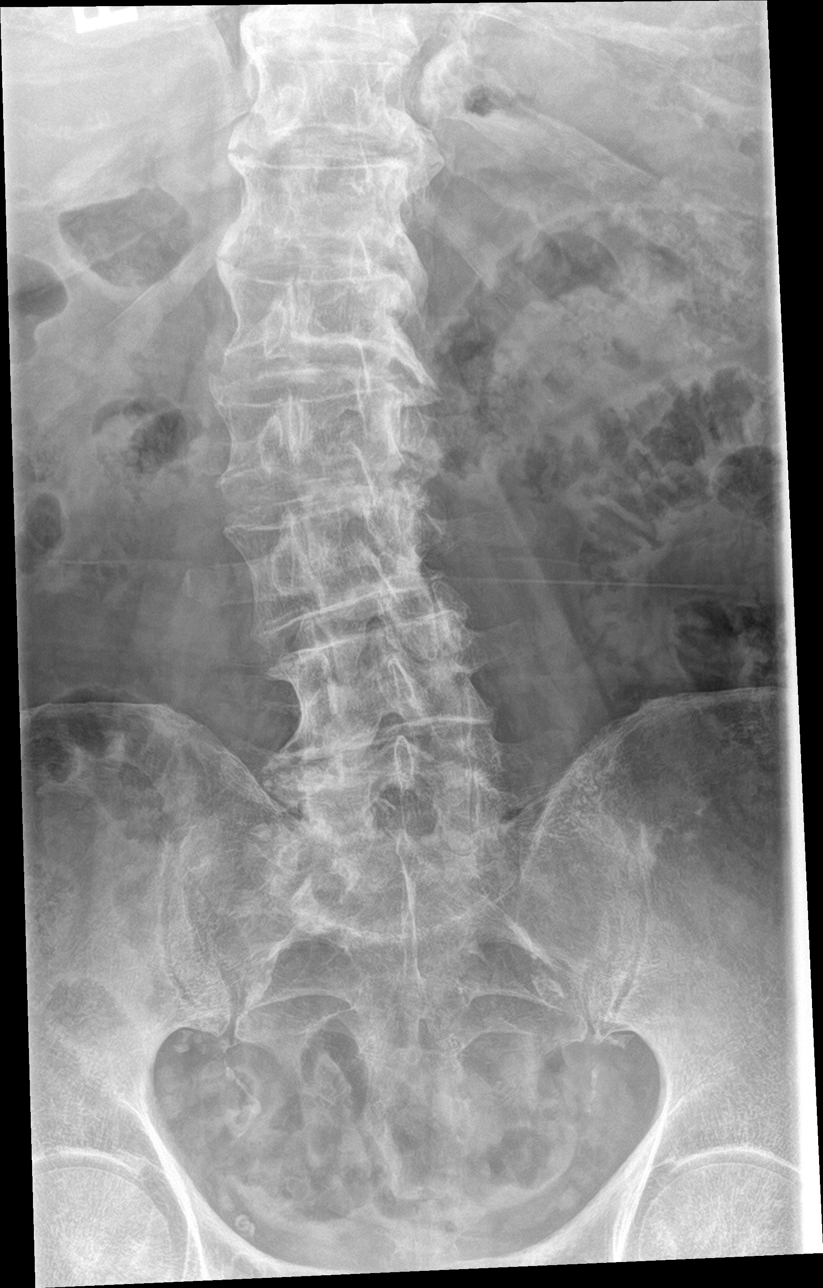

[l-spine obl (1 of 2)]
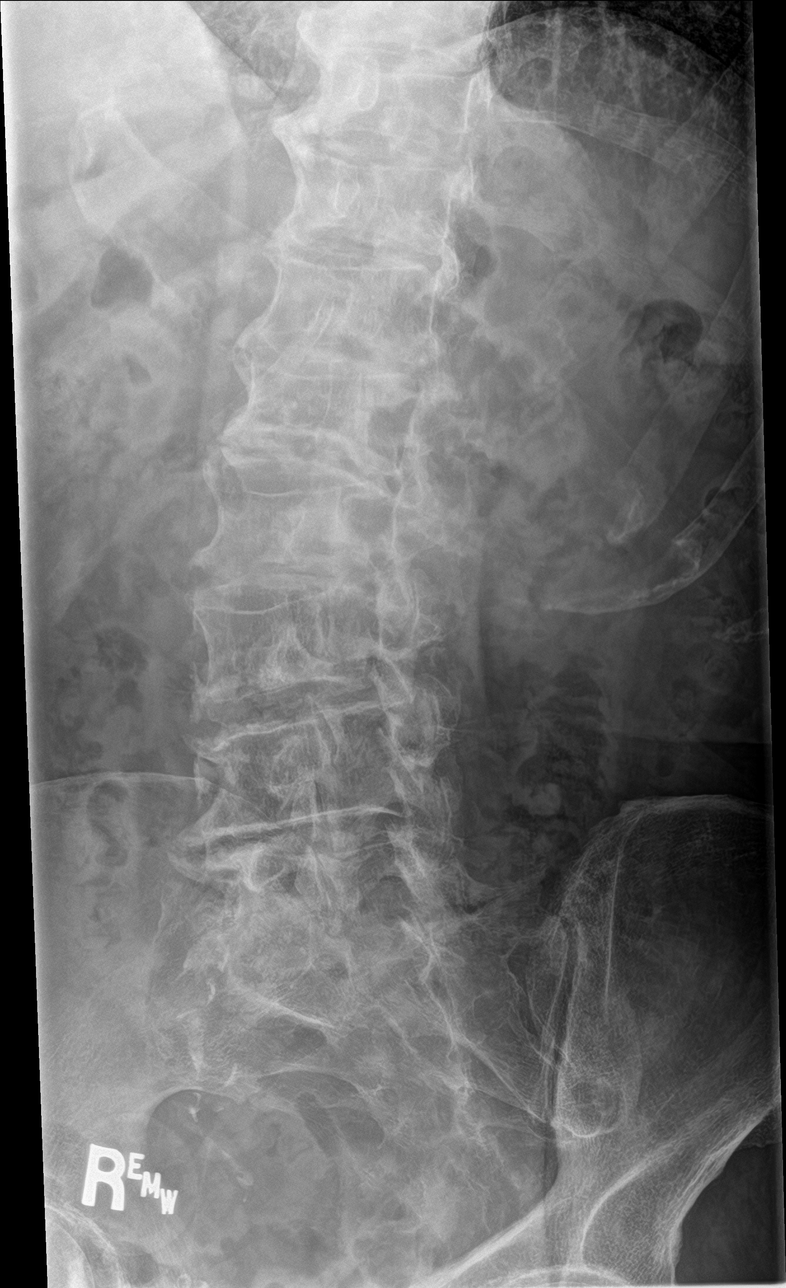

[l-spine obl (2 of 2)]
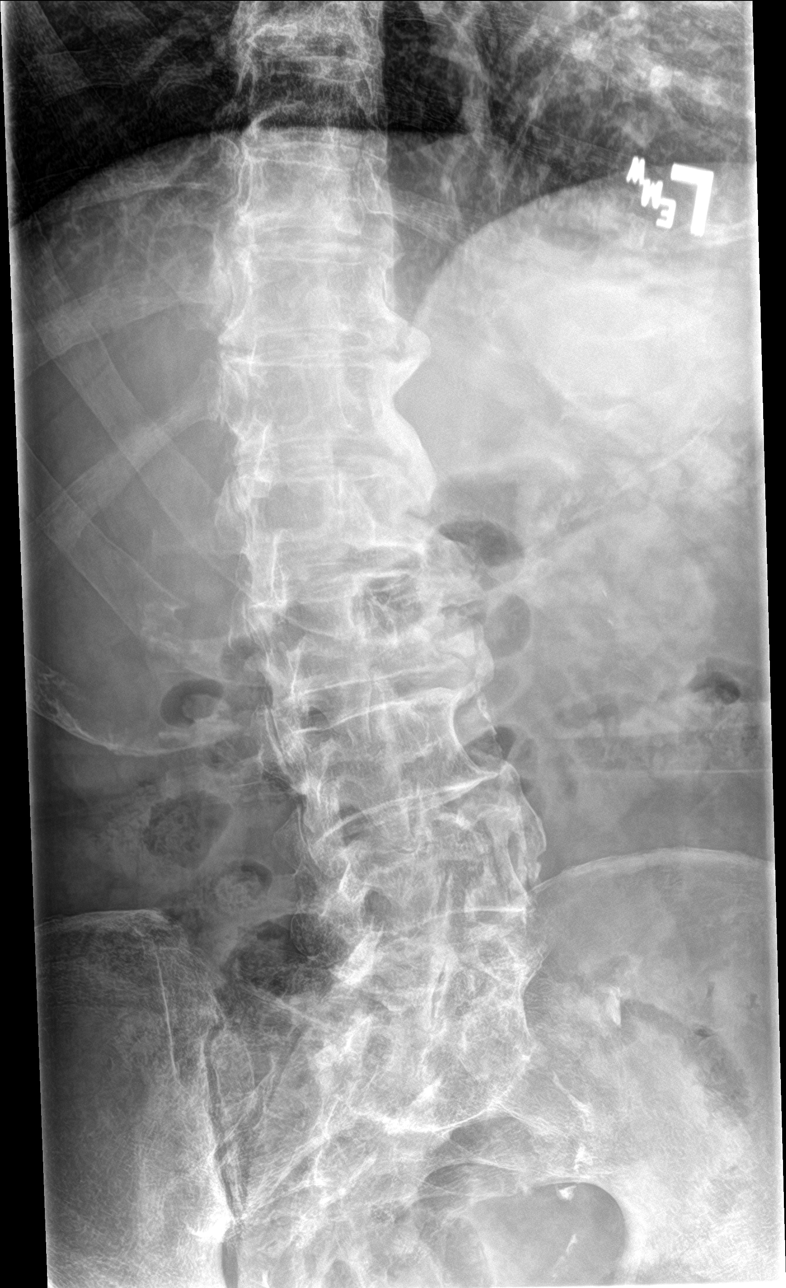

[l-spine lat]
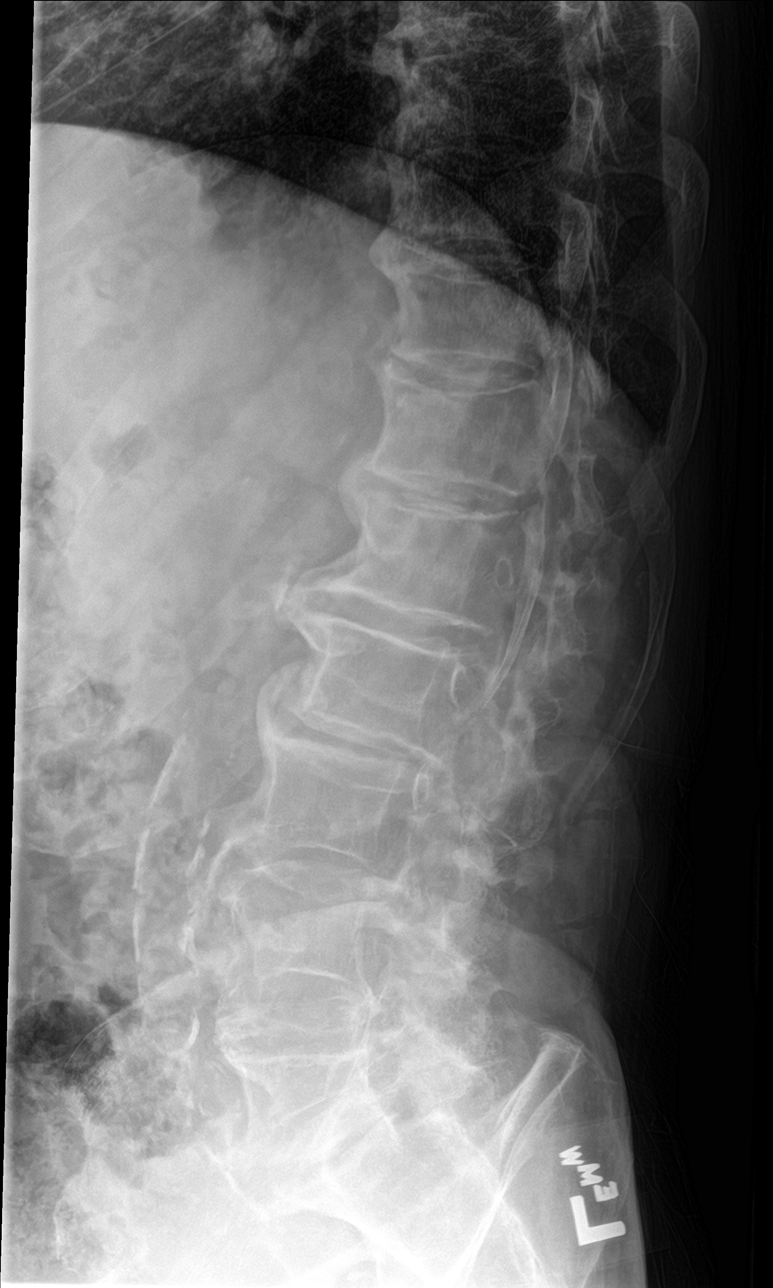

[l-spine spot]
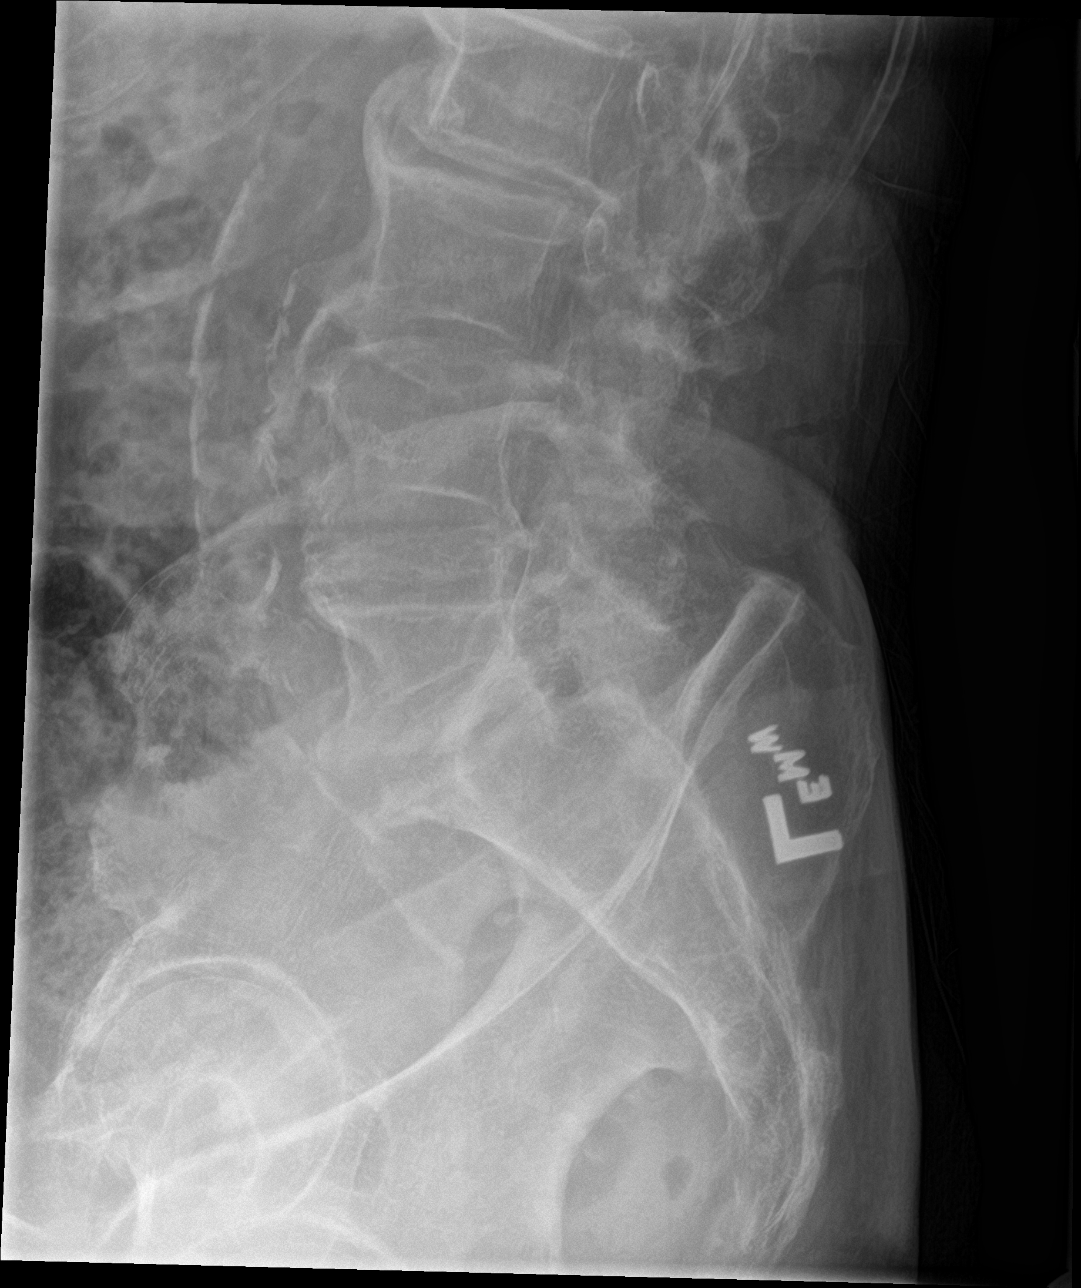

[5 of 5 positions shown; findings below may reference images not displayed]

FINDINGS: Frontal, lateral, spot lumbosacral lateral, and bilateral oblique
views were obtained. There are 5 non-rib-bearing lumbar type
vertebral bodies. There is lower lumbar levoscoliosis. There is no
fracture. There is 13 mm of anterolisthesis of L5 on S1. There is no
other spondylolisthesis. Pars defects are noted at L5 bilaterally.
There is moderate disc space narrowing at all levels, most notably
at L4-5 and L5-S1. There is facet osteoarthritic change at L4-5 and
L5-S1 bilaterally. There is atherosclerotic calcification in the
aorta and iliac arteries bilaterally.
IMPRESSION: Pars defects at L5 bilaterally with spondylolisthesis at L5-S1.
There is scoliosis with multilevel arthropathy. No fracture. There
is aortoiliac atherosclerosis.

## 2017-08-14 ENCOUNTER — Ambulatory Visit: Payer: Medicare Other | Admitting: Cardiology

## 2017-08-14 ENCOUNTER — Encounter: Payer: Self-pay | Admitting: Cardiology

## 2017-08-14 ENCOUNTER — Ambulatory Visit (INDEPENDENT_AMBULATORY_CARE_PROVIDER_SITE_OTHER): Payer: Medicare Other | Admitting: Cardiology

## 2017-08-14 VITALS — BP 136/70 | HR 74 | Ht 71.0 in | Wt 174.8 lb

## 2017-08-14 DIAGNOSIS — I472 Ventricular tachycardia: Secondary | ICD-10-CM | POA: Diagnosis not present

## 2017-08-14 DIAGNOSIS — I4729 Other ventricular tachycardia: Secondary | ICD-10-CM

## 2017-08-14 DIAGNOSIS — R9431 Abnormal electrocardiogram [ECG] [EKG]: Secondary | ICD-10-CM

## 2017-08-14 DIAGNOSIS — I2510 Atherosclerotic heart disease of native coronary artery without angina pectoris: Secondary | ICD-10-CM | POA: Diagnosis not present

## 2017-08-14 DIAGNOSIS — I255 Ischemic cardiomyopathy: Secondary | ICD-10-CM | POA: Diagnosis not present

## 2017-08-14 DIAGNOSIS — E782 Mixed hyperlipidemia: Secondary | ICD-10-CM | POA: Diagnosis not present

## 2017-08-14 NOTE — Progress Notes (Signed)
Cardiology Office Note:    Date:  08/14/2017   ID:  Evan Wright, DOB 09-24-1924, MRN [id number]  PCP:  Carollee Herter, Alferd Apa, DO  Cardiologist:  Jenean Lindau, MD   Referring MD: Carollee Herter, Alferd Apa, *    ASSESSMENT:    1. Coronary artery disease involving native coronary artery of native heart without angina pectoris   2. Ischemic cardiomyopathy   3. Nonsustained ventricular tachycardia ([place])   4. Abnormal EKG   5. Mixed dyslipidemia    PLAN:    In order of problems listed above:  1. Secondary prevention stressed to the patient.  Importance of compliance with diet and medications stressed and he vocalized understanding.  His blood pressure is stable and lipids are also stable.  Will continue his current medications. 2. Patient was advised to use stool softeners and we will also get a Chem-7 and a CBC today.  If these issues continue and he will need to see his primary care physician for evaluation.  In view of fairly recent stenting he will need dual antiplatelet therapy.  Benefits and potential risks were explained and he vocalized understanding. 3. Patient will be seen in follow-up appointment in 6 months or earlier if the patient has any concerns    Medication Adjustments/Labs and Tests Ordered: Current medicines are reviewed at length with the patient today.  Concerns regarding medicines are outlined above.  Orders Placed This Encounter  Procedures  . Basic metabolic panel  . CBC with Differential/Platelet   No orders of the defined types were placed in this encounter.    Chief Complaint  Patient presents with  . Follow-up    3 month flup appt   . Shortness of Breath     History of Present Illness:    Evan Wright is a 84 y.o. male.  Patient had coronary stenting for obstructive stenosis.  Subsequently he has done well and takes care of activities of daily living.  His angina has resolved he is very happy with this.  He says today he had some blood in his  stools and he has hemorrhoids.  This was fresh blood that is his only complaint today.  No chest pain orthopnea or PND.  At the time of my evaluation, the patient is alert awake oriented and in no distress.  His wife and daughter accompanying him for this visit.  Past Medical History:  Diagnosis Date  . Adenomatous colon polyp   . Angina pectoris ([place]) 04/2017  . CAD (coronary artery disease)    a. Cath 04/29/17-> high grade pLAD with R to L collaterals, high grade 1st dig  2. Cath 05/01/17 s/p ostial to mLAD with DES x 2.   . Cardiac murmur   . Coronary artery disease   . Diverticulosis   . GERD (gastroesophageal reflux disease)   . History of hiatal hernia   . Hyperlipidemia   . Osteoarthritis   . Prostate cancer [facility])     Past Surgical History:  Procedure Laterality Date  . APPENDECTOMY  1948  . COLONOSCOPY    . CORONARY STENT INTERVENTION N/A 05/01/2017   Procedure: CORONARY STENT INTERVENTION;  Surgeon: [name], [name], MD;  Location: [place] [facility];  Service: Cardiovascular;  Laterality: N/A;  . EYE SURGERY  2012   march and April  --Dr [name]  . LEFT HEART CATH AND CORONARY ANGIOGRAPHY N/A 04/29/2017   Procedure: LEFT HEART CATH AND CORONARY ANGIOGRAPHY;  Surgeon: [name], MD;  Location: [place] [facility];  Service: Cardiovascular;  Laterality: N/A;  . PROSTATECTOMY    . TONSILLECTOMY      Current Medications: Current Meds  Medication Sig  . aspirin EC 81 MG tablet Take 1 tablet (81 mg total) by mouth daily.  [name] atorvastatin (LIPITOR) 80 MG tablet Take 1 tablet (80 mg total) by mouth daily.  . Calcium-Phosphorus-Vitamin D (CITRACAL +D3 PO) Take 1 tablet by mouth daily with lunch.  . carvedilol (COREG) 3.125 MG tablet Take 1 tablet (3.125 mg total) 2 (two) times daily by mouth.  . cholecalciferol (VITAMIN D) 400 units TABS tablet Take 400 Units by mouth daily with lunch.  . clopidogrel (PLAVIX) 75 MG tablet Take 1 tablet (75 mg total) by mouth daily.  .  isosorbide mononitrate (IMDUR) 60 MG 24 hr tablet Take 1 tablet (60 mg total) by mouth daily.  [name] levothyroxine (SYNTHROID, LEVOTHROID) 50 MCG tablet TAKE ONE TABLET BY MOUTH EVERY DAY  . Multiple Vitamins-Minerals (ICAPS AREDS 2 PO) Take 1 tablet by mouth daily with lunch.  . Psyllium (METAMUCIL PO) Take 1 Dose by mouth daily.      Allergies:   Penicillins   Social History   Socioeconomic History  . Marital status: Married    Spouse name: None  . Number of children: 2  . Years of education: None  . Highest education level: None  Social Needs  . Financial resource strain: None  . Food insecurity - worry: None  . Food insecurity - inability: None  . Transportation needs - medical: None  . Transportation needs - non-medical: None  Occupational History  . Occupation: retired    Fish farm manager: RETIRED  Tobacco Use  . Smoking status: Never Smoker  . Smokeless tobacco: Never Used  Substance and Sexual Activity  . Alcohol use: No  . Drug use: No  . Sexual activity: None  Other Topics Concern  . None  Social History Narrative  . None     Family History: The patient's family history includes Colitis in his mother; Colon cancer in his brother; Lung cancer in his father; Prostate cancer in his father.  ROS:   Please see the history of present illness.    All other systems reviewed and are negative.  EKGs/Labs/Other Studies Reviewed:    The following studies were reviewed today: I discussed the findings of today's evaluation with the patient at extensive length.  Lipids evaluated last time was also reviewed.   Recent Labs: 01/07/2017: TSH 1.32 05/02/2017: Hemoglobin 11.8; Platelets 147 06/09/2017: ALT 29; BUN 18; Creatinine, Ser 0.99; Potassium 4.9; Sodium 145  Recent Lipid Panel    Component Value Date/Time   CHOL 115 06/09/2017 1204   TRIG 91 06/09/2017 1204   HDL 46 06/09/2017 1204   CHOLHDL 2.5 06/09/2017 1204   CHOLHDL 5 06/11/2016 1111   VLDL 37.2 06/11/2016 1111    LDLCALC 51 06/09/2017 1204   LDLDIRECT 165.3 10/08/2010 1008    Physical Exam:    VS:  BP 136/70 (BP Location: Left Arm, Patient Position: Sitting, Cuff Size: Normal)   Pulse 74   Ht 5\' 11"  (1.803 m)   Wt 174 lb 12.8 oz (79.3 kg)   SpO2 98%   BMI 24.38 kg/m     Wt Readings from Last 3 Encounters:  08/14/17 174 lb 12.8 oz (79.3 kg)  05/08/17 176 lb 1.9 oz (79.9 kg)  05/01/17 170 lb (77.1 kg)     GEN: Patient is in no acute distress HEENT: Normal  NECK: No JVD; No carotid bruits LYMPHATICS: No lymphadenopathy CARDIAC: Hear sounds regular, 2/6 systolic murmur at the apex. RESPIRATORY:  Clear to auscultation without rales, wheezing or rhonchi  ABDOMEN: Soft, non-tender, non-distended MUSCULOSKELETAL:  No edema; No deformity  SKIN: Warm and dry NEUROLOGIC:  Alert and oriented x 3 PSYCHIATRIC:  Normal affect   Signed, Jenean Lindau, MD  08/14/2017 1:50 PM    [facility]

## 2017-08-14 NOTE — Patient Instructions (Signed)
Medication Instructions:  Your physician recommends that you continue on your current medications as directed. Please refer to the Current Medication list given to you today.  Labwork: Your physician recommends that you have the following labs drawn: BMP and CBC  Testing/Procedures: None  Follow-Up: Your physician recommends that you schedule a follow-up appointment in: 6 months  Any Other Special Instructions Will Be Listed Below (If Applicable).     If you need a refill on your cardiac medications before your next appointment, please call your pharmacy.   [place], RN, BSN

## 2017-08-15 LAB — CBC WITH DIFFERENTIAL/PLATELET
BASOS ABS: 0 10*3/uL (ref 0.0–0.2)
Basos: 0 %
EOS (ABSOLUTE): 0.1 10*3/uL (ref 0.0–0.4)
Eos: 1 %
HEMOGLOBIN: 15.3 g/dL (ref 13.0–17.7)
Hematocrit: 45.7 % (ref 37.5–51.0)
IMMATURE GRANS (ABS): 0 10*3/uL (ref 0.0–0.1)
IMMATURE GRANULOCYTES: 0 %
LYMPHS: 15 %
Lymphocytes Absolute: 1.2 10*3/uL (ref 0.7–3.1)
MCH: 30.6 pg (ref 26.6–33.0)
MCHC: 33.5 g/dL (ref 31.5–35.7)
MCV: 91 fL (ref 79–97)
MONOCYTES: 7 %
Monocytes Absolute: 0.6 10*3/uL (ref 0.1–0.9)
NEUTROS ABS: 6.5 10*3/uL (ref 1.4–7.0)
NEUTROS PCT: 77 %
PLATELETS: 210 10*3/uL (ref 150–379)
RBC: 5 x10E6/uL (ref 4.14–5.80)
RDW: 13.5 % (ref 12.3–15.4)
WBC: 8.4 10*3/uL (ref 3.4–10.8)

## 2017-08-15 LAB — BASIC METABOLIC PANEL
BUN/Creatinine Ratio: 18 (ref 10–24)
BUN: 18 mg/dL (ref 10–36)
CALCIUM: 10 mg/dL (ref 8.6–10.2)
CHLORIDE: 102 mmol/L (ref 96–106)
CO2: 24 mmol/L (ref 20–29)
Creatinine, Ser: 0.98 mg/dL (ref 0.76–1.27)
GFR calc non Af Amer: 68 mL/min/{1.73_m2} (ref >59)
GFR, EST AFRICAN AMERICAN: 78 mL/min/{1.73_m2} (ref >59)
Glucose: 210 mg/dL — ABNORMAL HIGH (ref 65–99)
Potassium: 4.6 mmol/L (ref 3.5–5.2)
Sodium: 144 mmol/L (ref 134–144)

## 2017-09-17 ENCOUNTER — Telehealth: Payer: Self-pay | Admitting: *Deleted

## 2017-09-17 NOTE — Telephone Encounter (Signed)
Copied from [place]. Topic: Appointment Scheduling - Scheduling Inquiry for Clinic >> Sep 17, 2017 10:40 AM Conception Chancy, NT wrote: Patient has appt for 09/25/17 and would like to know does he need to fast for the appt. Please contact patient.

## 2017-09-18 NOTE — Telephone Encounter (Signed)
Patient wants his sugar rechecked.  He will be in fasting on 09/25/17

## 2017-09-25 ENCOUNTER — Encounter: Payer: Self-pay | Admitting: Family Medicine

## 2017-09-25 ENCOUNTER — Ambulatory Visit: Payer: Medicare Other | Admitting: Family Medicine

## 2017-09-25 VITALS — BP 160/80 | HR 61 | Temp 97.7°F | Resp 14 | Ht 70.87 in | Wt 176.0 lb

## 2017-09-25 DIAGNOSIS — G609 Hereditary and idiopathic neuropathy, unspecified: Secondary | ICD-10-CM | POA: Diagnosis not present

## 2017-09-25 DIAGNOSIS — R739 Hyperglycemia, unspecified: Secondary | ICD-10-CM

## 2017-09-25 DIAGNOSIS — M8580 Other specified disorders of bone density and structure, unspecified site: Secondary | ICD-10-CM

## 2017-09-25 DIAGNOSIS — E785 Hyperlipidemia, unspecified: Secondary | ICD-10-CM

## 2017-09-25 DIAGNOSIS — I10 Essential (primary) hypertension: Secondary | ICD-10-CM

## 2017-09-25 DIAGNOSIS — E039 Hypothyroidism, unspecified: Secondary | ICD-10-CM | POA: Diagnosis not present

## 2017-09-25 DIAGNOSIS — E782 Mixed hyperlipidemia: Secondary | ICD-10-CM

## 2017-09-25 DIAGNOSIS — R29898 Other symptoms and signs involving the musculoskeletal system: Secondary | ICD-10-CM | POA: Diagnosis not present

## 2017-09-25 DIAGNOSIS — I152 Hypertension secondary to endocrine disorders: Secondary | ICD-10-CM | POA: Insufficient documentation

## 2017-09-25 DIAGNOSIS — E1159 Type 2 diabetes mellitus with other circulatory complications: Secondary | ICD-10-CM

## 2017-09-25 DIAGNOSIS — I739 Peripheral vascular disease, unspecified: Secondary | ICD-10-CM

## 2017-09-25 HISTORY — DX: Essential (primary) hypertension: I10

## 2017-09-25 HISTORY — DX: Hyperglycemia, unspecified: R73.9

## 2017-09-25 HISTORY — DX: Hypertension secondary to endocrine disorders: I15.2

## 2017-09-25 HISTORY — DX: Hypothyroidism, unspecified: E03.9

## 2017-09-25 HISTORY — DX: Other specified disorders of bone density and structure, unspecified site: M85.80

## 2017-09-25 HISTORY — DX: Type 2 diabetes mellitus with other circulatory complications: E11.59

## 2017-09-25 LAB — COMPREHENSIVE METABOLIC PANEL
ALBUMIN: 4.3 g/dL (ref 3.5–5.2)
ALK PHOS: 54 U/L (ref 39–117)
ALT: 17 U/L (ref 0–53)
AST: 15 U/L (ref 0–37)
BILIRUBIN TOTAL: 0.7 mg/dL (ref 0.2–1.2)
BUN: 21 mg/dL (ref 6–23)
CALCIUM: 10.1 mg/dL (ref 8.4–10.5)
CO2: 27 mEq/L (ref 19–32)
Chloride: 107 mEq/L (ref 96–112)
Creatinine, Ser: 0.99 mg/dL (ref 0.40–1.50)
GFR: 75.3 mL/min (ref >60.00)
GLUCOSE: 140 mg/dL — AB (ref 70–99)
Potassium: 4.5 mEq/L (ref 3.5–5.1)
Sodium: 141 mEq/L (ref 135–145)
TOTAL PROTEIN: 7.3 g/dL (ref 6.0–8.3)

## 2017-09-25 LAB — LIPID PANEL
Cholesterol: 193 mg/dL (ref 0–200)
HDL: 58.4 mg/dL (ref >39.00)
LDL Cholesterol: 109 mg/dL — ABNORMAL HIGH (ref 0–99)
NONHDL: 134.1
TRIGLYCERIDES: 126 mg/dL (ref 0.0–149.0)
Total CHOL/HDL Ratio: 3
VLDL: 25.2 mg/dL (ref 0.0–40.0)

## 2017-09-25 LAB — TSH: TSH: 3.58 u[IU]/mL (ref 0.35–4.50)

## 2017-09-25 LAB — HEMOGLOBIN A1C: Hgb A1c MFr Bld: 7.2 % — ABNORMAL HIGH (ref 4.6–6.5)

## 2017-09-25 NOTE — Assessment & Plan Note (Signed)
Check labs today.

## 2017-09-25 NOTE — Patient Instructions (Signed)
Coronary Artery Disease, Male  Coronary artery disease (CAD) is a condition in which the arteries that lead to the heart (coronary arteries) become narrow or blocked. The narrowing or blockage can lead to decreased blood flow to the heart. Prolonged reduced blood flow can cause a heart attack (myocardial infarction or MI). This condition may also be called coronary heart disease.  Because CAD is the leading cause of death in men, it is important to understand what causes this condition and how it is treated.  What are the causes?  CAD is most often caused by atherosclerosis. This is the buildup of fat and cholesterol (plaque) on the inside of the arteries. Over time, the plaque may narrow or block the artery, reducing blood flow to the heart. Plaque can also become weak and break off within a coronary artery and cause a sudden blockage. Other less common causes of CAD include:   An embolism or blood clot in a coronary artery.   A tearing of the artery (spontaneous coronary artery dissection).   An aneurysm.   Inflammation (vasculitis) in the artery wall.    What increases the risk?  The following factors may make you more likely to develop this condition:   Age. Men over age 45 are at a greater risk of CAD.   Family history of CAD.   Gender. Men often develop CAD earlier in life than women.   High blood pressure (hypertension).   Diabetes.   High cholesterol levels.   Tobacco use.   Excessive alcohol use.   Lack of exercise.   A diet high in saturated and trans fats, such as fried food and processed meat.    Other possible risk factors include:   High stress levels.   Depression.   Obesity.   Sleep apnea.    What are the signs or symptoms?  Many people do not have any symptoms during the early stages of CAD. As the condition progresses, symptoms may include:   Chest pain (angina). The pain can:  ? Feel like a crushing or squeezing, or a tightness, pressure, fullness, or heaviness in the  chest.  ? Last more than a few minutes or can stop and recur. The pain tends to get worse with exercise or stress and to fade with rest.   Pain in the arms, neck, jaw, or back.   Unexplained heartburn or indigestion.   Shortness of breath.   Nausea or vomiting.   Sudden light-headedness.   Sudden cold sweats.   Fluttering or fast heartbeat (palpitations).    How is this diagnosed?  This condition is diagnosed based on:   Your family and medical history.   A physical exam.   Tests, including:  ? A test to check the electrical signals in your heart (electrocardiogram).  ? Exercise stress test. This looks for signs of blockage when the heart is stressed with exercise, such as running on a treadmill.  ? Pharmacologic stress test. This test looks for signs of blockage when the heart is being stressed with a medicine.  ? Blood tests.  ? Coronary angiogram. This is a procedure to look at the coronary arteries to see if there is any blockage. During this test, a dye is injected into your arteries so they appear on an X-ray.  ? A test that uses sound waves to take a picture of your heart (echocardiogram).  ? Chest X-ray.    How is this treated?  This condition may be treated by:     Healthy lifestyle changes to reduce risk factors.   Medicines such as:  ? Antiplatelet medicines and blood-thinning medicines, such as aspirin. These help to prevent blood clots.  ? Nitroglycerin.  ? Blood pressure medicines.  ? Cholesterol-lowering medicine.   Coronary angioplasty and stenting. During this procedure, a thin, flexible tube is inserted through a blood vessel and into a blocked artery. A balloon or similar device on the end of the tube is inflated to open up the artery. In some cases, a small, mesh tube (stent) is inserted into the artery to keep it open.   Coronary artery bypass surgery. During this surgery, veins or arteries from other parts of the body are used to create a bypass around the blockage and allow blood  to reach your heart.    Follow these instructions at home:  Medicines   Take over-the-counter and prescription medicines only as told by your health care provider.   Do not take the following medicines unless your health care provider approves:  ? NSAIDs, such as ibuprofen, naproxen, or celecoxib.  ? Vitamin supplements that contain vitamin A, vitamin E, or both.  Lifestyle   Follow an exercise program approved by your health care provider. Aim for 150 minutes of moderate exercise or 75 minutes of vigorous exercise each week.   Maintain a healthy weight or lose weight as approved by your health care provider.   Rest when you are tired.   Learn to manage stress or try to limit your stress. Ask your health care provider for suggestions if you need help.   Get screened for depression and seek treatment, if needed.   Do not use any products that contain nicotine or tobacco, such as cigarettes and e-cigarettes. If you need help quitting, ask your health care provider.   Do not use illegal drugs.  Eating and drinking   Follow a heart-healthy diet. A dietitian can help educate you about healthy food options and changes. In general, eat plenty of fruits and vegetables, lean meats, and whole grains.   Avoid foods high in:  ? Sugar.  ? Salt (sodium).  ? Saturated fat, such as processed or fatty meat.  ? Trans fat, such as fried foods.   Use healthy cooking methods such as roasting, grilling, broiling, baking, poaching, steaming, or stir-frying.   If you drink alcohol, and your health care provider approves, limit your alcohol intake to no more than 2 drinks per day. One drink equals 12 ounces of beer, 5 ounces of wine, or 1 ounces of hard liquor.  General instructions   Manage any other health conditions, such as hypertension and diabetes. These conditions affect your heart.   Your health care provider may ask you to monitor your blood pressure. Ideally, your blood pressure should be below 130/80.   Keep all  follow-up visits as told by your health care provider. This is important.  Get help right away if:   You have pain in your chest, neck, arm, jaw, stomach, or back that:  ? Lasts more than a few minutes.  ? Is recurring.  ? Is not relieved by taking medicine under your tongue (sublingualnitroglycerin).   You have too much (profuse) sweating without cause.   You have unexplained:  ? Heartburn or indigestion.  ? Shortness of breath or difficulty breathing.  ? Fluttering or fast heartbeat (palpitations).  ? Nausea or vomiting.  ? Fatigue.  ? Feelings of nervousness or anxiety.  ? Weakness.  ? Diarrhea.   You   have sudden light-headedness or dizziness.   You faint.   You feel like hurting yourself or think about taking your own life.  These symptoms may represent a serious problem that is an emergency. Do not wait to see if the symptoms will go away. Get medical help right away. Call your local emergency services (911 in the [place] Do not drive yourself to the hospital.  Summary   Coronary artery disease (CAD) is a process in which the arteries that lead to the heart (coronary arteries) become narrow or blocked. The narrowing or blockage can lead to a heart attack.   Many people do not have any symptoms during the early stages of CAD. This is called "silent CAD."   CAD can be treated with lifestyle changes, medicines, surgery, or a combination of these treatments.  This information is not intended to replace advice given to you by your health care provider. Make sure you discuss any questions you have with your health care provider.  Document Released: 02/02/2014 Document Revised: 06/28/2016 Document Reviewed: 06/28/2016  Elsevier Interactive Patient Education  2018 [employer]vier Inc[employer]

## 2017-09-25 NOTE — Assessment & Plan Note (Signed)
Well controlled, no changes to meds. Encouraged heart healthy diet such as the DASH diet and exercise as tolerated.  °

## 2017-09-25 NOTE — Assessment & Plan Note (Signed)
Pt does not want to do pt again He has to make a f/u with neuro for neuropathy

## 2017-09-25 NOTE — Progress Notes (Signed)
Subjective:  I acted as a Education administrator for [employer]. Evan Wright, Evan Wright   Patient ID: Evan Wright, male    DOB: 1924-12-09, 84 y.o.   MRN: [id number]  Chief Complaint  Patient presents with  . Medication Refill    HPI  Patient is in today for follow up.  Pt was recently in the hospital for MI.  Pt seeing cardiology closely.   He has to make f/u neuro for neuropathy----  He is also c/o weakness and heaviness in legs with walking.  He is been to PT but did not feel like it helped.   He is doing better still has some chest pain but cardiology is aware of this ---  No chest pain today.   His labs were done by cardiology and he states he is concerned because his sugars were very elevated.    Patient Care Team: Carollee Herter, Alferd Apa, DO as PCP - General Calvert Cantor, MD as Consulting Physician (Ophthalmology) Carolan Clines, MD as Consulting Physician (Urology)   Past Medical History:  Diagnosis Date  . Adenomatous colon polyp   . Angina pectoris ([place]) 04/2017  . CAD (coronary artery disease)    a. Cath 04/29/17-> high grade pLAD with R to L collaterals, high grade 1st dig  2. Cath 05/01/17 s/p ostial to mLAD with DES x 2.   . Cardiac murmur   . Coronary artery disease   . Diverticulosis   . GERD (gastroesophageal reflux disease)   . History of hiatal hernia   . Hyperlipidemia   . Loss of height 06/11/2016  . Osteoarthritis   . Prostate cancer [facility])     Past Surgical History:  Procedure Laterality Date  . APPENDECTOMY  1948  . COLONOSCOPY    . CORONARY STENT INTERVENTION N/A 05/01/2017   Procedure: CORONARY STENT INTERVENTION;  Surgeon: [name], [name], MD;  Location: [facility];  Service: Cardiovascular;  Laterality: N/A;  . EYE SURGERY  2012   march and April  --Dr [name]  . LEFT HEART CATH AND CORONARY ANGIOGRAPHY N/A 04/29/2017   Procedure: LEFT HEART CATH AND CORONARY ANGIOGRAPHY;  Surgeon: [name], MD;  Location: [facility];  Service: Cardiovascular;   Laterality: N/A;  . PROSTATECTOMY    . TONSILLECTOMY      Family History  Problem Relation Age of Onset  . Colon cancer Brother   . Lung cancer Father   . Prostate cancer Father   . Colitis Mother     Social History   Socioeconomic History  . Marital status: Married    Spouse name: Not on file  . Number of children: 2  . Years of education: Not on file  . Highest education level: Not on file  Social Needs  . Financial resource strain: Not on file  . Food insecurity - worry: Not on file  . Food insecurity - inability: Not on file  . Transportation needs - medical: Not on file  . Transportation needs - non-medical: Not on file  Occupational History  . Occupation: retired    Fish farm manager: RETIRED  Tobacco Use  . Smoking status: Never Smoker  . Smokeless tobacco: Never Used  Substance and Sexual Activity  . Alcohol use: No  . Drug use: No  . Sexual activity: Not on file  Other Topics Concern  . Not on file  Social History Narrative  . Not on file    Outpatient Medications Prior to Visit  Medication Sig Dispense Refill  .  aspirin EC 81 MG tablet Take 1 tablet (81 mg total) by mouth daily. 30 tablet 0  . atorvastatin (LIPITOR) 80 MG tablet Take 1 tablet (80 mg total) by mouth daily. 90 tablet 1  . Calcium-Phosphorus-Vitamin D (CITRACAL +D3 PO) Take 1 tablet by mouth daily with lunch.    . carvedilol (COREG) 3.125 MG tablet Take 1 tablet (3.125 mg total) 2 (two) times daily by mouth. 180 tablet 1  . cholecalciferol (VITAMIN D) 400 units TABS tablet Take 400 Units by mouth daily with lunch.    . clopidogrel (PLAVIX) 75 MG tablet Take 1 tablet (75 mg total) by mouth daily. 90 tablet 1  . isosorbide mononitrate (IMDUR) 60 MG 24 hr tablet Take 1 tablet (60 mg total) by mouth daily. 90 tablet 1  . levothyroxine (SYNTHROID, LEVOTHROID) 50 MCG tablet TAKE ONE TABLET BY MOUTH EVERY DAY 90 tablet 1  . Multiple Vitamins-Minerals (ICAPS AREDS 2 PO) Take 1 tablet by mouth daily with  lunch.    . Psyllium (METAMUCIL PO) Take 1 Dose by mouth daily.     . nitroGLYCERIN (NITROSTAT) 0.4 MG SL tablet Place 1 tablet (0.4 mg total) under the tongue every 5 (five) minutes as needed for chest pain. 25 tablet 11   No facility-administered medications prior to visit.     Allergies  Allergen Reactions  . Penicillins Other (See Comments)    unknown    Review of Systems  Constitutional: Negative for chills, fever and malaise/fatigue.  HENT: Negative for congestion and hearing loss.   Eyes: Negative for discharge.  Respiratory: Negative for cough, sputum production and shortness of breath.   Cardiovascular: Negative for chest pain, palpitations and leg swelling.  Gastrointestinal: Negative for abdominal pain, blood in stool, constipation, diarrhea, heartburn, nausea and vomiting.  Genitourinary: Negative for dysuria, frequency, hematuria and urgency.  Musculoskeletal: Positive for myalgias. Negative for back pain and falls.  Skin: Negative for rash.  Neurological: Negative for dizziness, sensory change, loss of consciousness, weakness and headaches.  Endo/Heme/Allergies: Negative for environmental allergies. Does not bruise/bleed easily.  Psychiatric/Behavioral: Negative for depression and suicidal ideas. The patient is not nervous/anxious and does not have insomnia.        Objective:    Physical Exam  Constitutional: He is oriented to person, place, and time. Vital signs are normal. He appears well-developed and well-nourished. He is sleeping.  HENT:  Head: Normocephalic and atraumatic.  Mouth/Throat: Oropharynx is clear and moist.  Eyes: EOM are normal. Pupils are equal, round, and reactive to light.  Neck: Normal range of motion. Neck supple. No thyromegaly present.  Cardiovascular: Normal rate and regular rhythm.  No murmur heard. Pulmonary/Chest: Effort normal and breath sounds normal. No respiratory distress. He has no wheezes. He has no rales. He exhibits no  tenderness.  Musculoskeletal: He exhibits no edema or tenderness.  Neurological: He is alert and oriented to person, place, and time.  Skin: Skin is warm and dry.  Psychiatric: He has a normal mood and affect. His behavior is normal. Judgment and thought content normal.  Nursing note and vitals reviewed.   BP (!) 160/80 (BP Location: Left Arm, Patient Position: Sitting, Cuff Size: Normal)   Pulse 61   Temp 97.7 F (36.5 C) (Oral)   Resp 14   Ht 5' 10.87" (1.8 m)   Wt 176 lb (79.8 kg)   SpO2 98%   BMI 24.64 kg/m  Wt Readings from Last 3 Encounters:  09/25/17 176 lb (79.8 kg)  08/14/17 174  lb 12.8 oz (79.3 kg)  05/08/17 176 lb 1.9 oz (79.9 kg)   BP Readings from Last 3 Encounters:  09/25/17 (!) 160/80  08/14/17 136/70  05/08/17 132/60     Immunization History  Administered Date(s) Administered  . Influenza Split 05/18/2012, 08/06/2014  . Influenza Whole 05/09/2010  . Influenza, High Dose Seasonal PF 06/11/2016, 05/02/2017  . Influenza-Unspecified 05/22/2013  . Pneumococcal Conjugate-13 08/06/2014  . Pneumococcal Polysaccharide-23 07/03/2005    Health Maintenance  Topic Date Due  . TETANUS/TDAP  08/28/1945  . INFLUENZA VACCINE  Completed  . PNA vac Low Risk Adult  Completed    Lab Results  Component Value Date   WBC 8.4 08/14/2017   HGB 15.3 08/14/2017   HCT 45.7 08/14/2017   PLT 210 08/14/2017   GLUCOSE 210 (H) 08/14/2017   CHOL 115 06/09/2017   TRIG 91 06/09/2017   HDL 46 06/09/2017   LDLDIRECT 165.3 10/08/2010   LDLCALC 51 06/09/2017   ALT 29 06/09/2017   AST 24 06/09/2017   NA 144 08/14/2017   K 4.6 08/14/2017   CL 102 08/14/2017   CREATININE 0.98 08/14/2017   BUN 18 08/14/2017   CO2 24 08/14/2017   TSH 1.32 01/07/2017   PSA 0.01 (L) 06/11/2016   INR 1.09 04/30/2017   HGBA1C 6.2 02/21/2015    Lab Results  Component Value Date   TSH 1.32 01/07/2017   Lab Results  Component Value Date   WBC 8.4 08/14/2017   HGB 15.3 08/14/2017   HCT 45.7  08/14/2017   MCV 91 08/14/2017   PLT 210 08/14/2017   Lab Results  Component Value Date   NA 144 08/14/2017   K 4.6 08/14/2017   CO2 24 08/14/2017   GLUCOSE 210 (H) 08/14/2017   BUN 18 08/14/2017   CREATININE 0.98 08/14/2017   BILITOT 0.7 06/09/2017   ALKPHOS 68 06/09/2017   AST 24 06/09/2017   ALT 29 06/09/2017   PROT 7.2 06/09/2017   ALBUMIN 4.5 06/09/2017   CALCIUM 10.0 08/14/2017   ANIONGAP 7 05/02/2017   GFR 76.41 06/11/2016   Lab Results  Component Value Date   CHOL 115 06/09/2017   Lab Results  Component Value Date   HDL 46 06/09/2017   Lab Results  Component Value Date   LDLCALC 51 06/09/2017   Lab Results  Component Value Date   TRIG 91 06/09/2017   Lab Results  Component Value Date   CHOLHDL 2.5 06/09/2017   Lab Results  Component Value Date   HGBA1C 6.2 02/21/2015         Assessment & Plan:   Problem List Items Addressed This Visit      Unprioritized   Essential hypertension    Well controlled, no changes to meds. Encouraged heart healthy diet such as the DASH diet and exercise as tolerated.       Relevant Orders   Lipid panel   Comprehensive metabolic panel   TSH   Hyperglycemia   Relevant Orders   Hemoglobin A1c   Hypothyroidism - Primary    Check labs today      Relevant Orders   Lipid panel   Comprehensive metabolic panel   TSH   Idiopathic peripheral neuropathy    F/u neuro      Mixed dyslipidemia    Check labs today Tolerating statin, encouraged heart healthy diet, avoid trans fats, minimize simple carbs and saturated fats. Increase exercise as tolerated      Osteopenia   Weakness of both  lower extremities    Pt does not want to do pt again He has to make a f/u with neuro for neuropathy        Other Visit Diagnoses    Hyperlipidemia LDL goal <100       Relevant Orders   Lipid panel   Comprehensive metabolic panel   TSH   Intermittent claudication ([place])       Relevant Orders   [place] Lower Ext Art Bilat        I am having Evan Wright maintain his Psyllium (METAMUCIL PO), aspirin EC, nitroGLYCERIN, Multiple Vitamins-Minerals (ICAPS AREDS 2 PO), Calcium-Phosphorus-Vitamin D (CITRACAL +D3 PO), cholecalciferol, carvedilol, levothyroxine, atorvastatin, isosorbide mononitrate, and clopidogrel.  No orders of the defined types were placed in this encounter.   CMA served as Education administrator during this visit. History, Physical and Plan performed by medical provider. Documentation and orders reviewed and attested to.  [name], DO

## 2017-09-25 NOTE — Assessment & Plan Note (Signed)
F/u neuro  

## 2017-09-25 NOTE — Assessment & Plan Note (Signed)
Check labs today Tolerating statin, encouraged heart healthy diet, avoid trans fats, minimize simple carbs and saturated fats. Increase exercise as tolerated 

## 2017-09-30 ENCOUNTER — Other Ambulatory Visit: Payer: Self-pay | Admitting: *Deleted

## 2017-09-30 DIAGNOSIS — E119 Type 2 diabetes mellitus without complications: Secondary | ICD-10-CM

## 2017-09-30 DIAGNOSIS — I10 Essential (primary) hypertension: Secondary | ICD-10-CM

## 2017-09-30 DIAGNOSIS — E782 Mixed hyperlipidemia: Secondary | ICD-10-CM

## 2017-09-30 MED ORDER — METFORMIN HCL ER 500 MG PO TB24: 500.0000 mg | ORAL_TABLET | Freq: Every evening | ORAL | 2 refills | Status: DC

## 2017-10-02 ENCOUNTER — Telehealth: Payer: Self-pay | Admitting: *Deleted

## 2017-10-02 NOTE — Telephone Encounter (Signed)
Copied from [place] [phone]. Topic: Quick Communication - Office Called Patient >> Oct 01, 2017 11:19 AM Evan Wright, [place] wrote: Reason for CRM: Patient calling because he had a missed call from the office and would like a call back. Also stated that he would like to have copies of his and his wife Evan Wright OV from last week. He can be reached at [phone]

## 2017-10-02 NOTE — Telephone Encounter (Signed)
Patient notified of labs.  He does want to take medication at this time and he does not want to check glucose.  He would like to work on diet and he will talk with dr Etter Sjogren in 3 months.  Education material sent.

## 2017-10-16 ENCOUNTER — Telehealth: Payer: Self-pay | Admitting: *Deleted

## 2017-10-16 NOTE — Telephone Encounter (Signed)
Copied from Rafael Gonzalez. Topic: General - Other >> Oct 15, 2017  9:55 AM Burnis Medin, NT wrote: CRM for notification. See Telephone encounter for: 10/15/17. Patient called and said he was just in the office and wanted a copy of his lab results and would like the doctor to mark the results that is high and low. Pt ask if this could be mailed to the address on file.

## 2017-10-17 NOTE — Telephone Encounter (Signed)
Printed letter of with lab results mailed to patient  Highlighted only high numbers.

## 2017-10-23 DIAGNOSIS — H02102 Unspecified ectropion of right lower eyelid: Secondary | ICD-10-CM | POA: Diagnosis not present

## 2017-10-23 DIAGNOSIS — H04123 Dry eye syndrome of bilateral lacrimal glands: Secondary | ICD-10-CM | POA: Diagnosis not present

## 2017-10-23 DIAGNOSIS — H02101 Unspecified ectropion of right upper eyelid: Secondary | ICD-10-CM | POA: Diagnosis not present

## 2017-10-23 DIAGNOSIS — H353131 Nonexudative age-related macular degeneration, bilateral, early dry stage: Secondary | ICD-10-CM | POA: Diagnosis not present

## 2017-12-08 ENCOUNTER — Other Ambulatory Visit: Payer: Self-pay | Admitting: Cardiology

## 2017-12-24 ENCOUNTER — Telehealth: Payer: Self-pay | Admitting: Cardiology

## 2017-12-24 NOTE — Telephone Encounter (Signed)
Patient states that his PCP ordered lab work to be drawn and patient wanted to make sure it was okay for him to come to our office to have this done by [facility]. Advised patient that he can do that, no appointment needed. Reminded him to fast before. The lab work orders have already been place by PCP office. Clarified that patient's follow up appointment is for the [facility] office with Dr. Geraldo Pitter on 02/16/18 at 10:20. Patient verbalized understanding. No further questions.

## 2017-12-24 NOTE — Telephone Encounter (Signed)
Patient has appt in [place] but wants to come here tomorrow for labs. He states last time Dr. Geraldo Pitter wanted cholesterol, glucose, blood sugar and A1C. Can these orders be put in system so [facility] can draw tomorrow?

## 2017-12-25 ENCOUNTER — Other Ambulatory Visit: Payer: Self-pay | Admitting: Family Medicine

## 2017-12-25 ENCOUNTER — Other Ambulatory Visit: Payer: Self-pay | Admitting: *Deleted

## 2017-12-25 DIAGNOSIS — E119 Type 2 diabetes mellitus without complications: Secondary | ICD-10-CM

## 2017-12-25 DIAGNOSIS — I10 Essential (primary) hypertension: Secondary | ICD-10-CM

## 2017-12-25 DIAGNOSIS — E785 Hyperlipidemia, unspecified: Secondary | ICD-10-CM

## 2017-12-26 LAB — HEMOGLOBIN A1C
ESTIMATED AVERAGE GLUCOSE: 137 mg/dL
HEMOGLOBIN A1C: 6.4 % — AB (ref 4.8–5.6)

## 2017-12-26 LAB — COMPREHENSIVE METABOLIC PANEL
A/G RATIO: 1.8 (ref 1.2–2.2)
ALK PHOS: 66 IU/L (ref 39–117)
ALT: 18 IU/L (ref 0–44)
AST: 17 IU/L (ref 0–40)
Albumin: 4.6 g/dL (ref 3.2–4.6)
BUN/Creatinine Ratio: 20 (ref 10–24)
BUN: 19 mg/dL (ref 10–36)
Bilirubin Total: 0.6 mg/dL (ref 0.0–1.2)
CHLORIDE: 106 mmol/L (ref 96–106)
CO2: 24 mmol/L (ref 20–29)
Calcium: 9.8 mg/dL (ref 8.6–10.2)
Creatinine, Ser: 0.97 mg/dL (ref 0.76–1.27)
GFR calc non Af Amer: 68 mL/min/{1.73_m2} (ref >59)
GFR, EST AFRICAN AMERICAN: 79 mL/min/{1.73_m2} (ref >59)
Globulin, Total: 2.5 g/dL (ref 1.5–4.5)
Glucose: 122 mg/dL — ABNORMAL HIGH (ref 65–99)
POTASSIUM: 4.8 mmol/L (ref 3.5–5.2)
Sodium: 143 mmol/L (ref 134–144)
Total Protein: 7.1 g/dL (ref 6.0–8.5)

## 2017-12-26 LAB — LIPID PANEL
CHOLESTEROL TOTAL: 165 mg/dL (ref 100–199)
Chol/HDL Ratio: 3.8 ratio (ref 0.0–5.0)
HDL: 44 mg/dL (ref >39)
LDL Calculated: 96 mg/dL (ref 0–99)
Triglycerides: 126 mg/dL (ref 0–149)
VLDL Cholesterol Cal: 25 mg/dL (ref 5–40)

## 2017-12-29 ENCOUNTER — Other Ambulatory Visit: Payer: Self-pay

## 2017-12-29 ENCOUNTER — Emergency Department (HOSPITAL_BASED_OUTPATIENT_CLINIC_OR_DEPARTMENT_OTHER): Payer: Medicare Other

## 2017-12-29 ENCOUNTER — Encounter (HOSPITAL_BASED_OUTPATIENT_CLINIC_OR_DEPARTMENT_OTHER): Payer: Self-pay

## 2017-12-29 ENCOUNTER — Emergency Department (HOSPITAL_BASED_OUTPATIENT_CLINIC_OR_DEPARTMENT_OTHER)
Admission: EM | Admit: 2017-12-29 | Discharge: 2017-12-29 | Disposition: A | Discharge status: Home / Self Care | Payer: Medicare Other | Attending: Emergency Medicine | Admitting: Emergency Medicine

## 2017-12-29 ENCOUNTER — Telehealth: Payer: Self-pay | Admitting: *Deleted

## 2017-12-29 DIAGNOSIS — R079 Chest pain, unspecified: Secondary | ICD-10-CM | POA: Diagnosis not present

## 2017-12-29 DIAGNOSIS — K2100 Gastro-esophageal reflux disease with esophagitis, without bleeding: Secondary | ICD-10-CM

## 2017-12-29 DIAGNOSIS — K219 Gastro-esophageal reflux disease without esophagitis: Secondary | ICD-10-CM | POA: Insufficient documentation

## 2017-12-29 DIAGNOSIS — Z79899 Other long term (current) drug therapy: Secondary | ICD-10-CM | POA: Diagnosis not present

## 2017-12-29 DIAGNOSIS — Z7902 Long term (current) use of antithrombotics/antiplatelets: Secondary | ICD-10-CM | POA: Insufficient documentation

## 2017-12-29 DIAGNOSIS — I2510 Atherosclerotic heart disease of native coronary artery without angina pectoris: Secondary | ICD-10-CM | POA: Diagnosis not present

## 2017-12-29 DIAGNOSIS — Z7984 Long term (current) use of oral hypoglycemic drugs: Secondary | ICD-10-CM | POA: Insufficient documentation

## 2017-12-29 DIAGNOSIS — Z7982 Long term (current) use of aspirin: Secondary | ICD-10-CM | POA: Insufficient documentation

## 2017-12-29 DIAGNOSIS — Z8546 Personal history of malignant neoplasm of prostate: Secondary | ICD-10-CM | POA: Diagnosis not present

## 2017-12-29 DIAGNOSIS — E039 Hypothyroidism, unspecified: Secondary | ICD-10-CM | POA: Diagnosis not present

## 2017-12-29 DIAGNOSIS — R1310 Dysphagia, unspecified: Secondary | ICD-10-CM | POA: Insufficient documentation

## 2017-12-29 DIAGNOSIS — R1312 Dysphagia, oropharyngeal phase: Secondary | ICD-10-CM | POA: Diagnosis not present

## 2017-12-29 LAB — COMPREHENSIVE METABOLIC PANEL
ALK PHOS: 62 U/L (ref 38–126)
ALT: 22 U/L (ref 17–63)
AST: 23 U/L (ref 15–41)
Albumin: 4.3 g/dL (ref 3.5–5.0)
Anion gap: 8 (ref 5–15)
BILIRUBIN TOTAL: 0.8 mg/dL (ref 0.3–1.2)
BUN: 20 mg/dL (ref 6–20)
CHLORIDE: 109 mmol/L (ref 101–111)
CO2: 25 mmol/L (ref 22–32)
CREATININE: 0.91 mg/dL (ref 0.61–1.24)
Calcium: 10.1 mg/dL (ref 8.9–10.3)
GFR calc Af Amer: >60 mL/min (ref >60)
Glucose, Bld: 124 mg/dL — ABNORMAL HIGH (ref 65–99)
Potassium: 4.1 mmol/L (ref 3.5–5.1)
Sodium: 142 mmol/L (ref 135–145)
TOTAL PROTEIN: 7.6 g/dL (ref 6.5–8.1)

## 2017-12-29 LAB — CBC WITH DIFFERENTIAL/PLATELET
Basophils Absolute: 0 10*3/uL (ref 0.0–0.1)
Basophils Relative: 0 %
Eosinophils Absolute: 0 10*3/uL (ref 0.0–0.7)
Eosinophils Relative: 0 %
HEMATOCRIT: 46.4 % (ref 39.0–52.0)
Hemoglobin: 15.9 g/dL (ref 13.0–17.0)
LYMPHS PCT: 19 %
Lymphs Abs: 1.4 10*3/uL (ref 0.7–4.0)
MCH: 31.1 pg (ref 26.0–34.0)
MCHC: 34.3 g/dL (ref 30.0–36.0)
MCV: 90.6 fL (ref 78.0–100.0)
Monocytes Absolute: 0.5 10*3/uL (ref 0.1–1.0)
Monocytes Relative: 7 %
NEUTROS ABS: 5.2 10*3/uL (ref 1.7–7.7)
Neutrophils Relative %: 74 %
Platelets: 190 10*3/uL (ref 150–400)
RBC: 5.12 MIL/uL (ref 4.22–5.81)
RDW: 12.5 % (ref 11.5–15.5)
WBC: 7.1 10*3/uL (ref 4.0–10.5)

## 2017-12-29 LAB — TROPONIN I: Troponin I: <0.03 ng/mL (ref <0.03)

## 2017-12-29 MED ORDER — PANTOPRAZOLE SODIUM 40 MG PO PACK
40.0000 mg | PACK | Freq: Every day | ORAL | Status: DC
  Filled 2017-12-29: qty 20

## 2017-12-29 MED ORDER — RANITIDINE HCL 15 MG/ML PO SYRP: 150.0000 mg | ORAL_SOLUTION | Freq: Two times a day (BID) | ORAL | 0 refills | Status: DC

## 2017-12-29 MED ORDER — RANITIDINE HCL 150 MG/10ML PO SYRP
150.0000 mg | ORAL_SOLUTION | Freq: Once | ORAL | Status: AC
  Administered 2017-12-29: 150 mg via ORAL
  Filled 2017-12-29: qty 10

## 2017-12-29 MED ORDER — LANSOPRAZOLE 30 MG PO CPDR: 30.0000 mg | DELAYED_RELEASE_CAPSULE | Freq: Every day | ORAL | 0 refills | Status: DC

## 2017-12-29 MED ORDER — SODIUM CHLORIDE 0.9 % IV BOLUS
1000.0000 mL | Freq: Once | INTRAVENOUS | Status: AC
  Administered 2017-12-29: 1000 mL via INTRAVENOUS

## 2017-12-29 MED FILL — LANSOPRAZOLE DR 30 MG CAPSU: 30 | 30 days supply | Qty: 30 | Fill #0

## 2017-12-29 MED FILL — RANITIDINE 15 MG/ML SYRUP: 75 | 5 days supply | Qty: 100 | Fill #0

## 2017-12-29 NOTE — ED Provider Notes (Signed)
5:31 PM Care assumed from Dr. Dayna Barker while awaiting delta troponin.    Plan of care is to discharge the patient the second troponin is negative.  It was negative.  Patient and family agree with plan of care for discharge outpatient cardiology and PCP follow-up.  Patient understood return precautions.  Patient remained chest pain-free and agree with discharge.  Patient discharged in good condition.   Clinical Impression: 1. Gastroesophageal reflux disease with esophagitis   2. Dysphagia, unspecified type     Disposition: Discharge  Condition: Good  I have discussed the results, Dx and Tx plan with the pt(& family if present). He/she/they expressed understanding and agree(s) with the plan. Discharge instructions discussed at great length. Strict return precautions discussed and pt &/or family have verbalized understanding of the instructions. No further questions at time of discharge.    New Prescriptions   LANSOPRAZOLE (PREVACID) 30 MG CAPSULE    Take 1 capsule (30 mg total) by mouth daily at 12 noon. Capsule into food daily   RANITIDINE (ZANTAC) 15 MG/ML SYRUP    Take 10 mLs (150 mg total) by mouth 2 (two) times daily for 5 days.    Follow Up: [name], DO [place] [address] [place] [address] [phone]  Schedule an appointment as soon as possible for a visit    [facility], [facility] [address] [place] [address] [phone]  Schedule an appointment as soon as possible for a visit  if symptoms not improving  [place] [address] [id number] mc [place] [phone]  If symptoms worsen     Tegeler, Gwenyth Allegra, MD 12/29/17 1732

## 2017-12-29 NOTE — ED Provider Notes (Signed)
[facility] EMERGENCY DEPARTMENT Provider Note   CSN: [id number] Arrival date & time: 12/29/17  1314     History   Chief Complaint Chief Complaint  Patient presents with  . Gastroesophageal Reflux    HPI Evan Wright is a 84 y.o. male.   Gastroesophageal Reflux  This is a new problem. The current episode started 1 to 2 hours ago. The problem occurs daily. The problem has been resolved. Associated symptoms include chest pain and abdominal pain. The symptoms are aggravated by eating. Nothing (vomiting) relieves the symptoms. He has tried nothing for the symptoms.    Past Medical History:  Diagnosis Date  . Adenomatous colon polyp   . Angina pectoris ([place]) 04/2017  . CAD (coronary artery disease)    a. Cath 04/29/17-> high grade pLAD with R to L collaterals, high grade 1st dig  2. Cath 05/01/17 s/p ostial to mLAD with DES x 2.   . Cardiac murmur   . Coronary artery disease   . Diverticulosis   . GERD (gastroesophageal reflux disease)   . History of hiatal hernia   . Hyperlipidemia   . Loss of height 06/11/2016  . Osteoarthritis   . Prostate cancer [facility])     Patient Active Problem List   Diagnosis Date Noted  . Osteopenia 09/25/2017  . Essential hypertension 09/25/2017  . Hyperglycemia 09/25/2017  . Hypothyroidism 09/25/2017  . CAD (coronary artery disease), native coronary artery 04/29/2017  . Nonsustained ventricular tachycardia ([place]) 04/24/2017  . Ischemic cardiomyopathy 04/10/2017  . Mixed dyslipidemia 04/10/2017  . Abnormal EKG 04/02/2017  . Idiopathic peripheral neuropathy 10/11/2016  . Proximal leg weakness 10/11/2016  . Weakness of both lower extremities 06/11/2016  . De Quervain's tenosynovitis 05/18/2012  . Family history of malignant neoplasm of gastrointestinal tract 05/14/2011  . NEVI, MULTIPLE 07/12/2010  . OSTEOARTHRITIS 07/12/2010  . UNSPECIFIED VITAMIN D DEFICIENCY 02/11/2008  . HIATAL HERNIA WITH REFLUX 02/11/2008  . GASTROENTERITIS  01/20/2008  . PERSONAL HISTORY OF COLONIC POLYPS 11/17/2007  . DIZZINESS 10/15/2007  . PROSTATE CANCER, HX OF 10/15/2007    Past Surgical History:  Procedure Laterality Date  . APPENDECTOMY  1948  . COLONOSCOPY    . CORONARY STENT INTERVENTION N/A 05/01/2017   Procedure: CORONARY STENT INTERVENTION;  Surgeon: [name], [name], MD;  Location: [facility] CV LAB;  Service: Cardiovascular;  Laterality: N/A;  . EYE SURGERY  2012   march and April  --Dr [name]  . LEFT HEART CATH AND CORONARY ANGIOGRAPHY N/A 04/29/2017   Procedure: LEFT HEART CATH AND CORONARY ANGIOGRAPHY;  Surgeon: [name], MD;  Location: [facility] LAB;  Service: Cardiovascular;  Laterality: N/A;  . PROSTATECTOMY    . TONSILLECTOMY          Home Medications    Prior to Admission medications   Medication Sig Start Date End Date Taking? Authorizing Provider  aspirin EC 81 MG tablet Take 1 tablet (81 mg total) by mouth daily. 04/02/17   Revankar, Reita Cliche, MD  atorvastatin (LIPITOR) 80 MG tablet Take 1 tablet (80 mg total) by mouth daily. 07/03/17 10/01/17  Revankar, Reita Cliche, MD  Calcium-Phosphorus-Vitamin D (CITRACAL +D3 PO) Take 1 tablet by mouth daily with lunch.    [provider]  carvedilol (COREG) 3.125 MG tablet TAKE ONE TABLET BY MOUTH TWICE DAILY 12/08/17   Revankar, Reita Cliche, MD  cholecalciferol (VITAMIN D) 400 units TABS tablet Take 400 Units by mouth daily with lunch.    [provider]  clopidogrel (PLAVIX) 75 MG tablet Take 1 tablet (75 mg total) by mouth daily. 07/03/17   Revankar, Reita Cliche, MD  isosorbide mononitrate (IMDUR) 60 MG 24 hr tablet Take 1 tablet (60 mg total) by mouth daily. 07/03/17   Revankar, Reita Cliche, MD  lansoprazole (PREVACID) 30 MG capsule Take 1 capsule (30 mg total) by mouth daily at 12 noon. Capsule into food daily 12/29/17   Lillyanna Glandon, Corene Cornea, MD  levothyroxine (SYNTHROID, LEVOTHROID) 50 MCG tablet TAKE ONE TABLET BY MOUTH EVERY DAY 06/25/17   Carollee Herter, Alferd Apa, DO    metFORMIN (GLUCOPHAGE-XR) 500 MG 24 hr tablet Take 1 tablet (500 mg total) by mouth every evening. 09/30/17   [name], DO  Multiple Vitamins-Minerals (ICAPS AREDS 2 PO) Take 1 tablet by mouth daily with lunch.    [provider]  nitroGLYCERIN (NITROSTAT) 0.4 MG SL tablet Place 1 tablet (0.4 mg total) under the tongue every 5 (five) minutes as needed for chest pain. 04/02/17 07/01/17  Revankar, Reita Cliche, MD  Psyllium (METAMUCIL PO) Take 1 Dose by mouth daily.     [provider]  ranitidine (ZANTAC) 15 MG/ML syrup Take 10 mLs (150 mg total) by mouth 2 (two) times daily for 5 days. 12/29/17 01/03/18  Debbora Ang, Corene Cornea, MD    Family History Family History  Problem Relation Age of Onset  . Colon cancer Brother   . Lung cancer Father   . Prostate cancer Father   . Colitis Mother     Social History Social History   Tobacco Use  . Smoking status: Never Smoker  . Smokeless tobacco: Never Used  Substance Use Topics  . Alcohol use: No  . Drug use: No     Allergies   Penicillins   Review of Systems Review of Systems  Cardiovascular: Positive for chest pain.  Gastrointestinal: Positive for abdominal pain.  All other systems reviewed and are negative.    Physical Exam Updated Vital Signs BP (!) 161/64   Pulse (!) 54   Temp 98.4 F (36.9 C) (Oral)   Resp 15   Ht 6' (1.829 m)   Wt 79.8 kg (176 lb)   SpO2 100%   BMI 23.87 kg/m   Physical Exam  Constitutional: He is oriented to person, place, and time. He appears well-developed and well-nourished.  HENT:  Head: Normocephalic and atraumatic.  Eyes: Conjunctivae and EOM are normal.  Neck: Normal range of motion.  Cardiovascular: Normal rate.  Pulmonary/Chest: Effort normal. No respiratory distress.  Abdominal: Soft. Bowel sounds are normal. He exhibits no distension. There is no tenderness.  Musculoskeletal: Normal range of motion. He exhibits no edema or deformity.  Neurological: He is alert and  oriented to person, place, and time. No cranial nerve deficit. Coordination normal.  Skin: Skin is warm and dry.  Nursing note and vitals reviewed.    ED Treatments / Results  Labs (all labs ordered are listed, but only abnormal results are displayed) Labs Reviewed  COMPREHENSIVE METABOLIC PANEL - Abnormal; Notable for the following components:      Result Value   Glucose, Bld 124 (*)    All other components within normal limits  TROPONIN I  CBC WITH DIFFERENTIAL/PLATELET  TROPONIN I    EKG EKG Interpretation  Date/Time:  Monday December 29 2017 13:18:44 EDT Ventricular Rate:  64 PR Interval:  164 QRS Duration: 88 QT Interval:  420 QTC Calculation: 433 R Axis:   24 Text Interpretation:  Normal sinus rhythm Cannot rule  out Anteroseptal infarct , age undetermined Abnormal ECG improved TWI compared to october 2018 Confirmed by Merrily Pew [phone]) on 12/29/2017 2:58:30 PM   Radiology Dg Chest 2 View  Result Date: 12/29/2017 CLINICAL DATA:  Chest pain.  Nonsmoker. EXAM: CHEST - 2 VIEW COMPARISON:  April 24, 2017 FINDINGS: The heart, hila, mediastinum, lungs, and pleura are normal. IMPRESSION: No active cardiopulmonary disease. Electronically Signed   By: Dorise Bullion III M.D   On: 12/29/2017 14:54    Procedures Procedures (including critical care time)  Medications Ordered in ED Medications  pantoprazole sodium (PROTONIX) 40 mg/20 mL oral suspension 40 mg (40 mg Oral Not Given 12/29/17 1429)  sodium chloride 0.9 % bolus 1,000 mL (0 mLs Intravenous Stopped 12/29/17 1520)  ranitidine (ZANTAC) 150 MG/10ML syrup 150 mg (150 mg Oral Given 12/29/17 1429)     Initial Impression / Assessment and Plan / ED Course  I have reviewed the triage vital signs and the nursing notes.  Pertinent labs & imaging results that were available during my care of the patient were reviewed by me and considered in my medical decision making (see chart for details).     Dysphagia intermittently  for a while. Worsened over last couple days. Multiple episodes of swallowing and food getting stuck followed by vomiting a couple minutes later. Has h/o hiatal hernia, reflux, not on medications. Also recent h/o ACS s/p stenting. Likely esophagitis as cause vs stricture or ring. Will start ppi/h2 blocker. 84 y/o w/ h/o CAD and CP so will get delta troponins but seems more GI related. If symptoms not improving will fu w/ GI for consideration of endoscope, otherwise pcp for further workup/management.  Care transferred to Dr. Sherry Ruffing pending repeat troponin and likely discharge.   Final Clinical Impressions(s) / ED Diagnoses   Final diagnoses:  Gastroesophageal reflux disease with esophagitis  Dysphagia, unspecified type    ED Discharge Orders        Ordered    lansoprazole (PREVACID) 30 MG capsule  Daily     12/29/17 1545    ranitidine (ZANTAC) 15 MG/ML syrup  2 times daily     12/29/17 1545       Koltin Wehmeyer, Corene Cornea, MD 12/29/17 1610

## 2017-12-29 NOTE — Telephone Encounter (Signed)
Patient also vomited with breakfast this morning. Patient did take "heart pill" this morning with water, this also came back up. Patient is also having sharp chest pain intermittently.   Advised at this time that patient should go to the nearest emergency department to be evaluated. Advised that this may or may not be heart related, but the fact that the patient cannot keep anything down at this point including water, there could be something else wrong and that the patient could get dehydrated very quickly. Daughter verbalized understanding and will have patient go to the nearest emergency department. No further questions.

## 2017-12-29 NOTE — ED Triage Notes (Signed)
Pt states that every time he eats or drinks causes CP and he vomits-started yesterday-pt presents to triage in w/c-NAD

## 2017-12-29 NOTE — ED Notes (Signed)
Water provided for pt to sip.

## 2017-12-29 NOTE — Telephone Encounter (Signed)
Pt's daughter phoned today about father. Pt is having reflux more than normal. Yesterday every time he ate he had reflux and then last night vomited and felt better. Now this morning is having the same thing every time he eats something he has reflux and some pain in center of chest. Would like to know if there is anything he can take or does he need to go to ED.

## 2017-12-31 ENCOUNTER — Encounter: Payer: Self-pay | Admitting: *Deleted

## 2018-01-01 ENCOUNTER — Other Ambulatory Visit: Payer: Self-pay | Admitting: Cardiology

## 2018-01-05 ENCOUNTER — Other Ambulatory Visit: Payer: Self-pay | Admitting: Family Medicine

## 2018-01-12 ENCOUNTER — Other Ambulatory Visit: Payer: Self-pay | Admitting: Cardiology

## 2018-01-23 ENCOUNTER — Other Ambulatory Visit: Payer: Self-pay | Admitting: Cardiology

## 2018-01-23 NOTE — Telephone Encounter (Signed)
Attempted to contact patient. Phone is busy. Will continue efforts.

## 2018-01-23 NOTE — Telephone Encounter (Signed)
Please call in refill of Lansoprazole 30mg  to [facility] for 90 day supply. Pt was in [place] ER and this was prescribed.

## 2018-01-28 MED ORDER — LANSOPRAZOLE 30 MG PO CPDR: 30.0000 mg | DELAYED_RELEASE_CAPSULE | Freq: Every day | ORAL | 0 refills | Status: DC

## 2018-01-28 NOTE — Telephone Encounter (Signed)
Patient called the office due to medication not being refilled and having ran out. I did let him know that we would refill 1 time but his PCP would need to take over the prescription since this was not a cardiac medication.

## 2018-01-28 NOTE — Addendum Note (Signed)
Addended by: Aleatha Borer on: 01/28/2018 04:32 PM   Modules accepted: Orders

## 2018-02-16 ENCOUNTER — Encounter: Payer: Self-pay | Admitting: Cardiology

## 2018-02-16 ENCOUNTER — Ambulatory Visit: Payer: Medicare Other | Admitting: Cardiology

## 2018-02-16 VITALS — BP 120/68 | HR 72 | Ht 71.0 in | Wt 169.0 lb

## 2018-02-16 DIAGNOSIS — I255 Ischemic cardiomyopathy: Secondary | ICD-10-CM | POA: Diagnosis not present

## 2018-02-16 DIAGNOSIS — I2510 Atherosclerotic heart disease of native coronary artery without angina pectoris: Secondary | ICD-10-CM | POA: Diagnosis not present

## 2018-02-16 DIAGNOSIS — I4729 Other ventricular tachycardia: Secondary | ICD-10-CM

## 2018-02-16 DIAGNOSIS — E782 Mixed hyperlipidemia: Secondary | ICD-10-CM

## 2018-02-16 DIAGNOSIS — I472 Ventricular tachycardia: Secondary | ICD-10-CM

## 2018-02-16 NOTE — Patient Instructions (Addendum)
Medication Instructions:  Your physician has recommended you make the following change in your medication:  STOP isosorbide mononitrate (imdur)   Labwork: None  Testing/Procedures: None  Follow-Up: Your physician wants you to follow-up in: 6 months. You will receive a reminder letter in the mail two months in advance. If you don't receive a letter, please call our office to schedule the follow-up appointment.   If you need a refill on your cardiac medications before your next appointment, please call your pharmacy.   Thank you for choosing [facility]! Robyne Peers, RN [phone]

## 2018-02-16 NOTE — Progress Notes (Signed)
Cardiology Office Note:    Date:  02/16/2018   ID:  [name], DOB 1925/04/14, MRN [id number]  PCP:  Carollee Herter, Alferd Apa, DO  Cardiologist:  Jenean Lindau, MD   Referring MD: Carollee Herter, Alferd Apa, *    ASSESSMENT:    1. Ischemic cardiomyopathy   2. Nonsustained ventricular tachycardia ([place])   3. Coronary artery disease involving native coronary artery of native heart without angina pectoris   4. Mixed dyslipidemia    PLAN:    In order of problems listed above:  1. Secondary prevention stressed with the patient.  Importance of compliance with diet and medication stressed and he vocalized understanding.  His blood pressure is stable.  Diet was discussed with dyslipidemia. 2. He denies any symptoms of angina.  He is on Imdur and I have stopped this medication in view of dizziness I worry that he has lower blood pressure at home than what is represented here.  If he has any new symptoms we will get in touch with me especially that concerns [place] for angina. 3. Patient will be seen in follow-up appointment in 6 months or earlier if the patient has any concerns    Medication Adjustments/Labs and Tests Ordered: Current medicines are reviewed at length with the patient today.  Concerns regarding medicines are outlined above.  No orders of the defined types were placed in this encounter.  No orders of the defined types were placed in this encounter.    Chief Complaint  Patient presents with  . Follow-up     History of Present Illness:    Evan Wright is a 84 y.o. male.  Patient has known coronary artery disease post stenting.  He denies any problems at this time and takes care of activities of daily living.  No chest pain orthopnea or PND.  He ambulates age appropriately.  The patient mentions to me that he occasionally feels dizzy.  He also has issues with neuropathy.  At the time of my evaluation, the patient is alert awake oriented and in no distress.  Past Medical  History:  Diagnosis Date  . Adenomatous colon polyp   . Angina pectoris ([place]) 04/2017  . CAD (coronary artery disease)    a. Cath 04/29/17-> high grade pLAD with R to L collaterals, high grade 1st dig  2. Cath 05/01/17 s/p ostial to mLAD with DES x 2.   . Cardiac murmur   . Coronary artery disease   . Diverticulosis   . GERD (gastroesophageal reflux disease)   . History of hiatal hernia   . Hyperlipidemia   . Loss of height 06/11/2016  . Osteoarthritis   . Prostate cancer [facility])     Past Surgical History:  Procedure Laterality Date  . APPENDECTOMY  1948  . COLONOSCOPY    . CORONARY STENT INTERVENTION N/A 05/01/2017   Procedure: CORONARY STENT INTERVENTION;  Surgeon: [name], [name], MD;  Location: [facility] CV LAB;  Service: Cardiovascular;  Laterality: N/A;  . EYE SURGERY  2012   march and April  --Dr [name]  . LEFT HEART CATH AND CORONARY ANGIOGRAPHY N/A 04/29/2017   Procedure: LEFT HEART CATH AND CORONARY ANGIOGRAPHY;  Surgeon: [name], MD;  Location: [facility] LAB;  Service: Cardiovascular;  Laterality: N/A;  . PROSTATECTOMY    . TONSILLECTOMY      Current Medications: Current Meds  Medication Sig  . aspirin EC 81 MG tablet Take 1 tablet (81 mg total) by mouth daily.  [name]  atorvastatin (LIPITOR) 80 MG tablet Take 1 tablet (80 mg total) by mouth daily.  . Calcium-Phosphorus-Vitamin D (CITRACAL +D3 PO) Take 1 tablet by mouth daily with lunch.  . carvedilol (COREG) 3.125 MG tablet TAKE ONE TABLET BY MOUTH TWICE DAILY  . cholecalciferol (VITAMIN D) 400 units TABS tablet Take 400 Units by mouth daily with lunch.  . clopidogrel (PLAVIX) 75 MG tablet TAKE ONE TABLET BY MOUTH EVERY DAY  . isosorbide mononitrate (IMDUR) 60 MG 24 hr tablet TAKE ONE TABLET BY MOUTH EVERY DAY  . lansoprazole (PREVACID) 30 MG capsule Take 1 capsule (30 mg total) by mouth daily at 12 noon. Capsule into food daily  . levothyroxine (SYNTHROID, LEVOTHROID) 50 MCG tablet TAKE ONE TABLET BY MOUTH  EVERY DAY  . metFORMIN (GLUCOPHAGE-XR) 500 MG 24 hr tablet Take 1 tablet (500 mg total) by mouth every evening.  . Multiple Vitamins-Minerals (ICAPS AREDS 2 PO) Take 1 tablet by mouth daily with lunch.  . nitroGLYCERIN (NITROSTAT) 0.4 MG SL tablet Place 1 tablet (0.4 mg total) under the tongue every 5 (five) minutes as needed for chest pain.  [name] Psyllium (METAMUCIL PO) Take 1 Dose by mouth daily.      Allergies:   Penicillins   Social History   Socioeconomic History  . Marital status: Married    Spouse name: Not on file  . Number of children: 2  . Years of education: Not on file  . Highest education level: Not on file  Occupational History  . Occupation: retired    Fish farm manager: RETIRED  Social Needs  . Financial resource strain: Not on file  . Food insecurity:    Worry: Not on file    Inability: Not on file  . Transportation needs:    Medical: Not on file    Non-medical: Not on file  Tobacco Use  . Smoking status: Never Smoker  . Smokeless tobacco: Never Used  Substance and Sexual Activity  . Alcohol use: No  . Drug use: No  . Sexual activity: Not on file  Lifestyle  . Physical activity:    Days per week: Not on file    Minutes per session: Not on file  . Stress: Not on file  Relationships  . Social connections:    Talks on phone: Not on file    Gets together: Not on file    Attends religious service: Not on file    Active member of club or organization: Not on file    Attends meetings of clubs or organizations: Not on file    Relationship status: Not on file  Other Topics Concern  . Not on file  Social History Narrative  . Not on file     Family History: The patient's family history includes Colitis in his mother; Colon cancer in his brother; Lung cancer in his father; Prostate cancer in his father.  ROS:   Please see the history of present illness.    All other systems reviewed and are negative.  EKGs/Labs/Other Studies Reviewed:    The following studies  were reviewed today: I discussed my findings with the patient at length.  The lab work from last evaluation in June was also revisited.   Recent Labs: 09/25/2017: TSH 3.58 12/29/2017: ALT 22; BUN 20; Creatinine, Ser 0.91; Hemoglobin 15.9; Platelets 190; Potassium 4.1; Sodium 142  Recent Lipid Panel    Component Value Date/Time   CHOL 165 12/25/2017 1050   TRIG 126 12/25/2017 1050   HDL 44 12/25/2017 1050  CHOLHDL 3.8 12/25/2017 1050   CHOLHDL 3 09/25/2017 1035   VLDL 25.2 09/25/2017 1035   LDLCALC 96 12/25/2017 1050   LDLDIRECT 165.3 10/08/2010 1008    Physical Exam:    VS:  BP 120/68   Pulse 72   Ht 5\' 11"  (1.803 m)   Wt 169 lb (76.7 kg)   SpO2 98%   BMI 23.57 kg/m     Wt Readings from Last 3 Encounters:  02/16/18 169 lb (76.7 kg)  12/29/17 176 lb (79.8 kg)  09/25/17 176 lb (79.8 kg)     GEN: Patient is in no acute distress HEENT: Normal NECK: No JVD; No carotid bruits LYMPHATICS: No lymphadenopathy CARDIAC: Hear sounds regular, 2/6 systolic murmur at the apex. RESPIRATORY:  Clear to auscultation without rales, wheezing or rhonchi  ABDOMEN: Soft, non-tender, non-distended MUSCULOSKELETAL:  No edema; No deformity  SKIN: Warm and dry NEUROLOGIC:  Alert and oriented x 3 PSYCHIATRIC:  Normal affect   Signed, Jenean Lindau, MD  02/16/2018 10:45 AM    [place]

## 2018-02-24 ENCOUNTER — Other Ambulatory Visit: Payer: Self-pay | Admitting: Family Medicine

## 2018-04-13 ENCOUNTER — Ambulatory Visit (INDEPENDENT_AMBULATORY_CARE_PROVIDER_SITE_OTHER): Payer: Medicare Other | Admitting: Family Medicine

## 2018-04-13 ENCOUNTER — Telehealth: Payer: Self-pay | Admitting: Family Medicine

## 2018-04-13 DIAGNOSIS — Z23 Encounter for immunization: Secondary | ICD-10-CM | POA: Diagnosis not present

## 2018-04-13 DIAGNOSIS — N5089 Other specified disorders of the male genital organs: Secondary | ICD-10-CM

## 2018-04-13 DIAGNOSIS — M8580 Other specified disorders of bone density and structure, unspecified site: Secondary | ICD-10-CM

## 2018-04-13 NOTE — Progress Notes (Signed)
Here for flu shot only

## 2018-04-13 NOTE — Telephone Encounter (Signed)
Pt is requesting bone density to be done or orders in when he come back in 07/27/2018 for physical. Will you have Dr. Etter Sjogren put the orders in for this so we can call him and let him know to call and get this scheduled?

## 2018-04-14 NOTE — Telephone Encounter (Signed)
Order placed

## 2018-04-14 NOTE — Telephone Encounter (Signed)
ok 

## 2018-04-14 NOTE — Telephone Encounter (Signed)
Please advise if okay to place order for bone density.

## 2018-04-15 NOTE — Telephone Encounter (Signed)
Called pt left message that orders have been place and for pt to call imaging to schedule bone density appt for 07/27/2018.

## 2018-05-12 ENCOUNTER — Telehealth: Payer: Self-pay | Admitting: Family Medicine

## 2018-05-12 NOTE — Telephone Encounter (Unsigned)
Copied from [place] [phone]. Topic: Quick Communication - Rx Refill/Question >> May 12, 2018 11:01 AM Judyann Munson wrote: Pharmacy is calling to advise this medication is out of stock and they are requesting a approval to change the medication (SYNTHROID, LEVOTHROID) 50 MCG tablet . Pharmacy stated the patient is there waiting for the medication. Please advise

## 2018-05-18 ENCOUNTER — Telehealth: Payer: Self-pay | Admitting: *Deleted

## 2018-05-18 NOTE — Telephone Encounter (Signed)
Ok to change and left message on machine at pharmacy.  Need to recheck labs in 2 months

## 2018-05-18 NOTE — Telephone Encounter (Signed)
Copied from [place] [phone]. Topic: Quick Communication - Rx Refill/Question >> May 12, 2018 11:01 AM Judyann Munson wrote: Pharmacy is calling to advise this medication is out of stock and they are requesting a approval to change the medication (SYNTHROID, LEVOTHROID) 50 MCG tablet . Pharmacy stated the patient is there waiting for the medication. Please advise >> May 18, 2018 10:17 AM Yvette Rack wrote: [facility] calling stating that they don't have thelevothyroxine (SYNTHROID, LEVOTHROID) 50 MCG tablet in stock and would like to switch it to the [place] instead        [place], [place] - [place] [phone] (Phone) [phone] (Fax)

## 2018-05-18 NOTE — Telephone Encounter (Signed)
Copied from [place] [phone]. Topic: Quick Communication - Rx Refill/Question >> May 12, 2018 11:01 AM Judyann Munson wrote: Pharmacy is calling to advise this medication is out of stock and they are requesting a approval to change the medication (SYNTHROID, LEVOTHROID) 50 MCG tablet . Pharmacy stated the patient is there waiting for the medication. Please advise >> May 18, 2018 10:17 AM Yvette Rack wrote: [facility] calling stating that they don't have thelevothyroxine (SYNTHROID, LEVOTHROID) 50 MCG tablet in stock and would like to switch it to the [place] instead        [place], [place] - [place] [phone] (Phone) [phone] (Fax)

## 2018-06-11 ENCOUNTER — Telehealth: Payer: Self-pay | Admitting: *Deleted

## 2018-06-11 ENCOUNTER — Other Ambulatory Visit: Payer: Self-pay

## 2018-06-11 MED ORDER — ATORVASTATIN CALCIUM 80 MG PO TABS: 80.0000 mg | ORAL_TABLET | Freq: Every day | ORAL | 1 refills | Status: DC

## 2018-06-11 NOTE — Telephone Encounter (Signed)
*  STAT* If patient is at the pharmacy, call can be transferred to refill team.   1. Which medications need to be refilled? (please list name of each medication and dose if known) Atorvastatin 80 mg   2. Which pharmacy/location (including street and city if local pharmacy) is medication to be sent to?[place]  3. Do they need a 30 day or 90 day supply? 90 day

## 2018-06-11 NOTE — Telephone Encounter (Signed)
Med refill has been sent. 

## 2018-06-16 IMAGING — DX DG CHEST 2V
2 series · 2 of 2 positions shown · non-contrast
Comparison: None.

CLINICAL DATA: Mid substernal chest pain one day ago. History of
prostate cancer.

EXAM:
CHEST  2 VIEW

[chest pa]
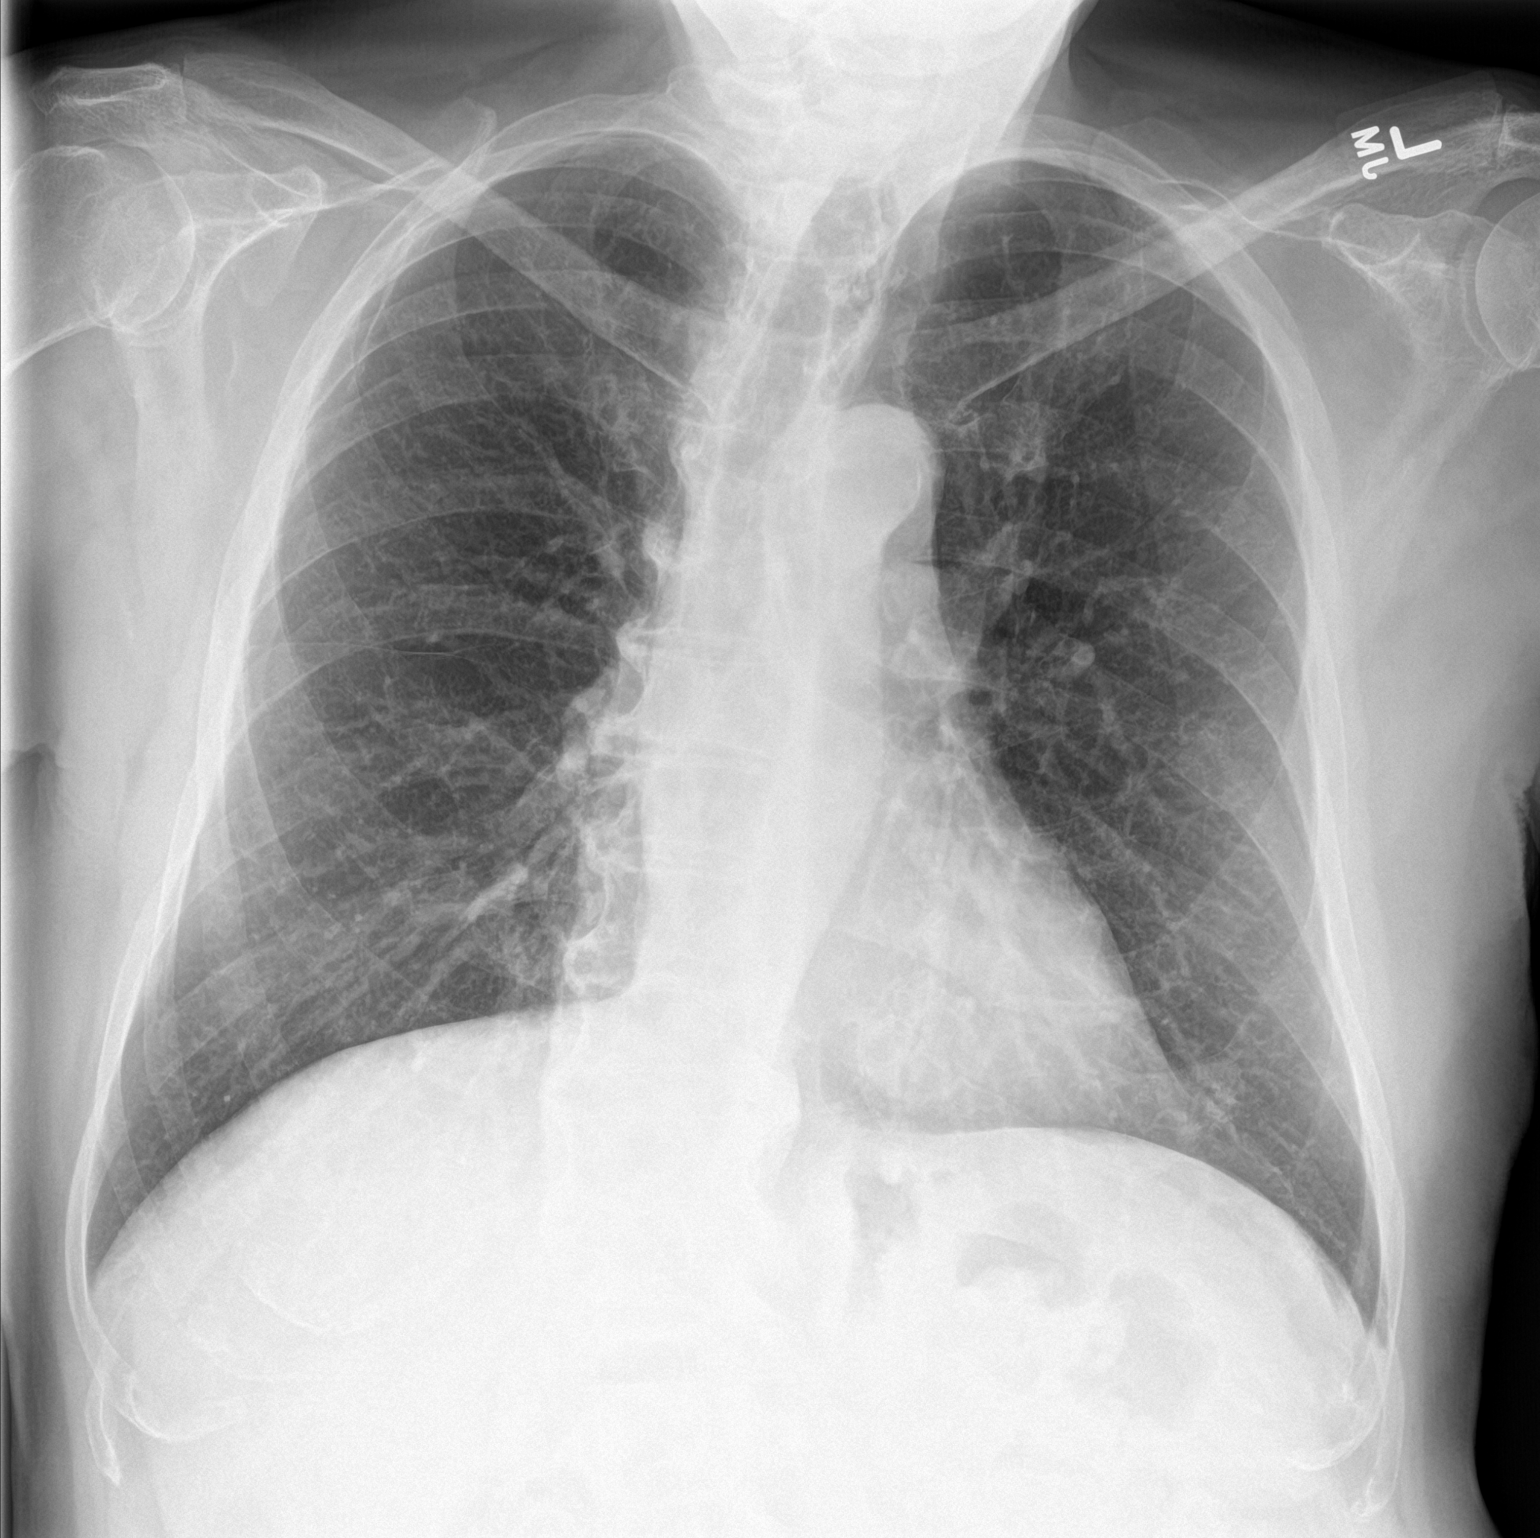

[chest lat]
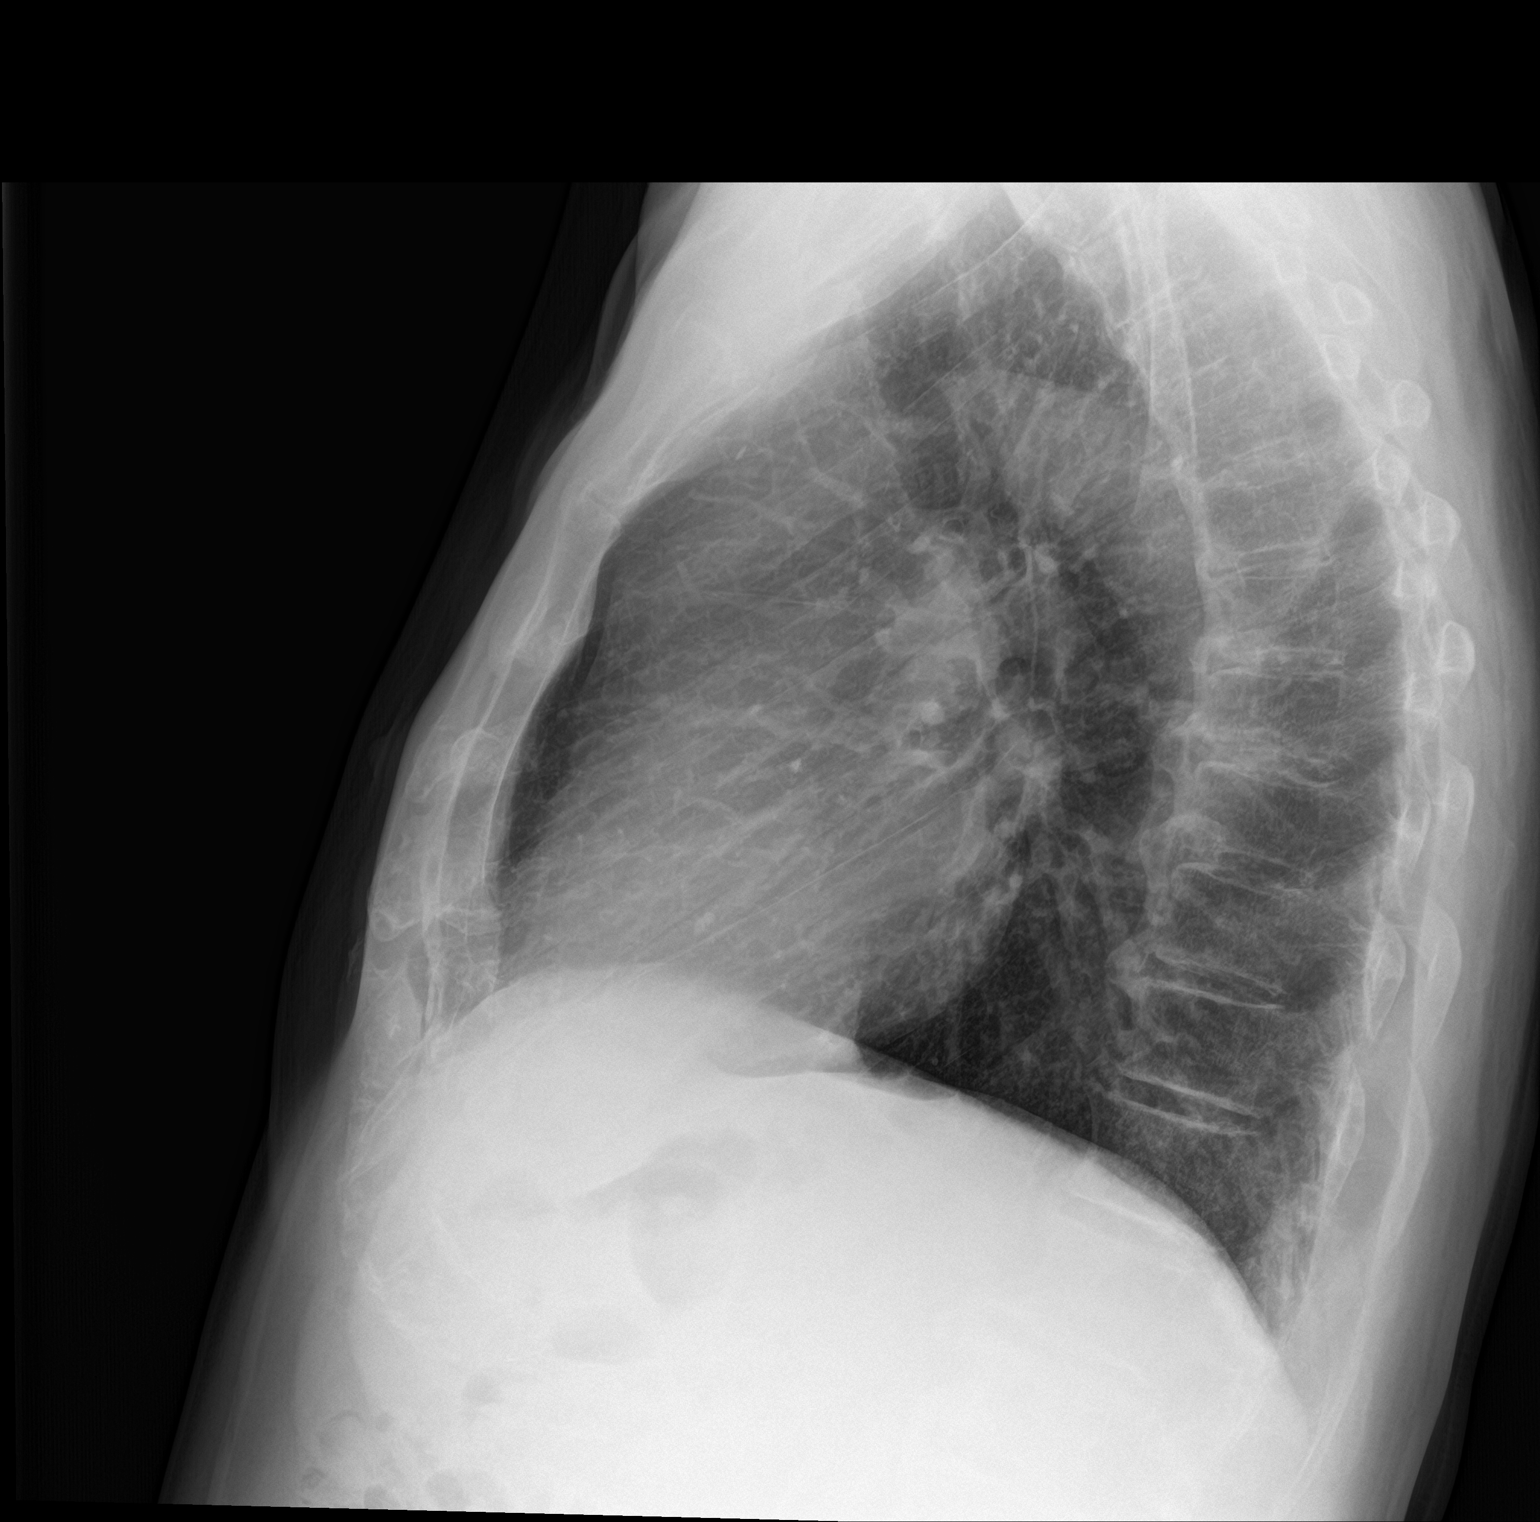

[2 of 2 positions shown; findings below may reference images not displayed]

FINDINGS: Unremarkable cardiac silhouette. Calcified tortuous aorta. No
infiltrates or failure. No effusion or pneumothorax. Mild
degenerative change thoracic spine
IMPRESSION: No active disease.

## 2018-07-01 ENCOUNTER — Ambulatory Visit (HOSPITAL_BASED_OUTPATIENT_CLINIC_OR_DEPARTMENT_OTHER)
Admission: RE | Admit: 2018-07-01 | Discharge: 2018-07-01 | Disposition: A | Discharge status: Home / Self Care | Payer: Medicare Other | Source: Ambulatory Visit | Attending: Family Medicine | Admitting: Family Medicine

## 2018-07-01 DIAGNOSIS — N5089 Other specified disorders of the male genital organs: Secondary | ICD-10-CM | POA: Diagnosis not present

## 2018-07-01 DIAGNOSIS — M8589 Other specified disorders of bone density and structure, multiple sites: Secondary | ICD-10-CM | POA: Diagnosis not present

## 2018-07-01 DIAGNOSIS — M8580 Other specified disorders of bone density and structure, unspecified site: Secondary | ICD-10-CM | POA: Diagnosis present

## 2018-07-10 ENCOUNTER — Ambulatory Visit: Payer: Medicare Other | Admitting: Neurology

## 2018-07-10 ENCOUNTER — Encounter: Payer: Self-pay | Admitting: Neurology

## 2018-07-10 ENCOUNTER — Encounter

## 2018-07-10 ENCOUNTER — Other Ambulatory Visit: Payer: Self-pay

## 2018-07-10 VITALS — BP 144/86 | HR 64 | Ht 71.0 in | Wt 174.0 lb

## 2018-07-10 DIAGNOSIS — G609 Hereditary and idiopathic neuropathy, unspecified: Secondary | ICD-10-CM | POA: Diagnosis not present
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
## 2018-07-10 NOTE — Patient Instructions (Addendum)
1. Recommend using walker more regularly 2. Continue to monitor driving, if issues arise, would definitely stop driving 3. Follow-up in 6 months, call for any changes

## 2018-07-23 ENCOUNTER — Other Ambulatory Visit: Payer: Self-pay | Admitting: Cardiology

## 2018-07-27 ENCOUNTER — Encounter: Payer: Medicare Other | Admitting: Family Medicine

## 2018-07-28 ENCOUNTER — Other Ambulatory Visit: Payer: Self-pay | Admitting: *Deleted

## 2018-07-28 MED ORDER — LANSOPRAZOLE 30 MG PO CPDR: 30.0000 mg | DELAYED_RELEASE_CAPSULE | Freq: Every day | ORAL | 1 refills | Status: DC

## 2018-07-31 ENCOUNTER — Other Ambulatory Visit: Payer: Self-pay | Admitting: Family Medicine

## 2018-08-12 ENCOUNTER — Ambulatory Visit: Payer: Medicare Other | Admitting: Cardiology

## 2018-08-12 ENCOUNTER — Telehealth: Payer: Self-pay | Admitting: Emergency Medicine

## 2018-08-12 ENCOUNTER — Encounter: Payer: Self-pay | Admitting: Cardiology

## 2018-08-12 ENCOUNTER — Ambulatory Visit (INDEPENDENT_AMBULATORY_CARE_PROVIDER_SITE_OTHER): Payer: Medicare Other | Admitting: Cardiology

## 2018-08-12 VITALS — BP 168/70 | HR 65 | Ht 71.0 in | Wt 174.0 lb

## 2018-08-12 DIAGNOSIS — I10 Essential (primary) hypertension: Secondary | ICD-10-CM

## 2018-08-12 DIAGNOSIS — I472 Ventricular tachycardia: Secondary | ICD-10-CM | POA: Diagnosis not present

## 2018-08-12 DIAGNOSIS — I2510 Atherosclerotic heart disease of native coronary artery without angina pectoris: Secondary | ICD-10-CM

## 2018-08-12 DIAGNOSIS — I4729 Other ventricular tachycardia: Secondary | ICD-10-CM

## 2018-08-12 DIAGNOSIS — I255 Ischemic cardiomyopathy: Secondary | ICD-10-CM

## 2018-08-12 NOTE — Progress Notes (Signed)
Cardiology Office Note:    Date:  08/12/2018   ID:  TORRIE LAFAVOR, DOB 12/06/1924, MRN [id number]  PCP:  Carollee Herter, Alferd Apa, DO  Cardiologist:  Jenean Lindau, MD   Referring MD: Carollee Herter, Alferd Apa, *    ASSESSMENT:    1. Ischemic cardiomyopathy   2. Nonsustained ventricular tachycardia ([place])   3. Coronary artery disease involving native coronary artery of native heart without angina pectoris   4. Essential hypertension    PLAN:    In order of problems listed above:  1. Secondary prevention stressed with the patient.  Importance of compliance with diet and medication stressed and he vocalized understanding.  His blood pressure is elevated.  I told him that I would like to start him on low-dose Imdur again in view of his symptoms.  I am not sure whether these are from coronary etiology.  I told him to use nitroglycerin as needed and precautions were explained. 2. He will have baseline blood work today.  He tells me that he is going to get his primary care check his lipids in a month 3. He will be seen in follow-up appointment in a month or earlier if he has any concerns.  Is to go to the nearest emergency room for any concerning symptoms.   Medication Adjustments/Labs and Tests Ordered: Current medicines are reviewed at length with the patient today.  Concerns regarding medicines are outlined above.  No orders of the defined types were placed in this encounter.  No orders of the defined types were placed in this encounter.    Chief Complaint  Patient presents with  . Follow-up    6 months      History of Present Illness:    ACEYN KATHOL is a 85 y.o. male.  Patient has past medical history of coronary artery disease and is undergone stenting.  He leads a sedentary lifestyle his wife and daughter accompany him for this visit.  He mentions to me that occasionally he will have some chest discomfort.  He is taken Prevacid for this.  He is not sure whether this comes on  exertion or activity.  At the time of my evaluation, the patient is alert awake oriented and in no distress.  Past Medical History:  Diagnosis Date  . Adenomatous colon polyp   . Angina pectoris ([place]) 04/2017  . CAD (coronary artery disease)    a. Cath 04/29/17-> high grade pLAD with R to L collaterals, high grade 1st dig  2. Cath 05/01/17 s/p ostial to mLAD with DES x 2.   . Cardiac murmur   . Coronary artery disease   . Diverticulosis   . GERD (gastroesophageal reflux disease)   . History of hiatal hernia   . Hyperlipidemia   . Loss of height 06/11/2016  . Osteoarthritis   . Prostate cancer [facility])     Past Surgical History:  Procedure Laterality Date  . APPENDECTOMY  1948  . COLONOSCOPY    . CORONARY STENT INTERVENTION N/A 05/01/2017   Procedure: CORONARY STENT INTERVENTION;  Surgeon: [name], [name], MD;  Location: [facility];  Service: Cardiovascular;  Laterality: N/A;  . EYE SURGERY  2012   march and April  --Dr [name]  . LEFT HEART CATH AND CORONARY ANGIOGRAPHY N/A 04/29/2017   Procedure: LEFT HEART CATH AND CORONARY ANGIOGRAPHY;  Surgeon: [name], MD;  Location: [place] [facility];  Service: Cardiovascular;  Laterality: N/A;  . PROSTATECTOMY    .  TONSILLECTOMY      Current Medications: Current Meds  Medication Sig  . aspirin EC 81 MG tablet Take 1 tablet (81 mg total) by mouth daily.  [name] atorvastatin (LIPITOR) 80 MG tablet Take 1 tablet (80 mg total) by mouth daily.  . Calcium-Phosphorus-Vitamin D (CITRACAL +D3 PO) Take 1 tablet by mouth daily with lunch.  . carvedilol (COREG) 3.125 MG tablet TAKE ONE TABLET BY MOUTH TWICE DAILY  . cholecalciferol (VITAMIN D) 400 units TABS tablet Take 400 Units by mouth daily with lunch.  . clopidogrel (PLAVIX) 75 MG tablet TAKE ONE TABLET BY MOUTH EVERY DAY  . lansoprazole (PREVACID) 30 MG capsule TAKE ONE CAPSULE BY MOUTH EVERY DAY AT 12 NOON WITH FOOD  . levothyroxine (SYNTHROID, LEVOTHROID) 50 MCG tablet TAKE ONE  TABLET BY MOUTH EVERY DAY  . Multiple Vitamins-Minerals (ICAPS AREDS 2 PO) Take 1 tablet by mouth daily with lunch.  . nitroGLYCERIN (NITROSTAT) 0.4 MG SL tablet Place 1 tablet (0.4 mg total) under the tongue every 5 (five) minutes as needed for chest pain.  [name] Psyllium (METAMUCIL PO) Take 1 Dose by mouth daily.      Allergies:   Penicillins   Social History   Socioeconomic History  . Marital status: Married    Spouse name: Not on file  . Number of children: 2  . Years of education: Not on file  . Highest education level: Not on file  Occupational History  . Occupation: retired    Fish farm manager: RETIRED  Social Needs  . Financial resource strain: Not on file  . Food insecurity:    Worry: Not on file    Inability: Not on file  . Transportation needs:    Medical: Not on file    Non-medical: Not on file  Tobacco Use  . Smoking status: Never Smoker  . Smokeless tobacco: Never Used  Substance and Sexual Activity  . Alcohol use: No  . Drug use: No  . Sexual activity: Not on file  Lifestyle  . Physical activity:    Days per week: Not on file    Minutes per session: Not on file  . Stress: Not on file  Relationships  . Social connections:    Talks on phone: Not on file    Gets together: Not on file    Attends religious service: Not on file    Active member of club or organization: Not on file    Attends meetings of clubs or organizations: Not on file    Relationship status: Not on file  Other Topics Concern  . Not on file  Social History Narrative  . Not on file     Family History: The patient's family history includes Colitis in his mother; Colon cancer in his brother; Lung cancer in his father; Prostate cancer in his father.  ROS:   Please see the history of present illness.    All other systems reviewed and are negative.  EKGs/Labs/Other Studies Reviewed:    The following studies were reviewed today: I discussed my findings with the patient at length.   Recent  Labs: 09/25/2017: TSH 3.58 12/29/2017: ALT 22; BUN 20; Creatinine, Ser 0.91; Hemoglobin 15.9; Platelets 190; Potassium 4.1; Sodium 142  Recent Lipid Panel    Component Value Date/Time   CHOL 165 12/25/2017 1050   TRIG 126 12/25/2017 1050   HDL 44 12/25/2017 1050   CHOLHDL 3.8 12/25/2017 1050   CHOLHDL 3 09/25/2017 1035   VLDL 25.2 09/25/2017 1035   LDLCALC 96  12/25/2017 1050   LDLDIRECT 165.3 10/08/2010 1008    Physical Exam:    VS:  BP (!) 168/70   Pulse 65   Ht 5\' 11"  (1.803 m)   Wt 174 lb (78.9 kg)   SpO2 98%   BMI 24.27 kg/m     Wt Readings from Last 3 Encounters:  08/12/18 174 lb (78.9 kg)  07/10/18 174 lb (78.9 kg)  02/16/18 169 lb (76.7 kg)     GEN: Patient is in no acute distress HEENT: Normal NECK: No JVD; No carotid bruits LYMPHATICS: No lymphadenopathy CARDIAC: Hear sounds regular, 2/6 systolic murmur at the apex. RESPIRATORY:  Clear to auscultation without rales, wheezing or rhonchi  ABDOMEN: Soft, non-tender, non-distended MUSCULOSKELETAL:  No edema; No deformity  SKIN: Warm and dry NEUROLOGIC:  Alert and oriented x 3 PSYCHIATRIC:  Normal affect   Signed, Jenean Lindau, MD  08/12/2018 11:55 AM    [place]

## 2018-08-12 NOTE — Telephone Encounter (Signed)
Left message for patient to return call regarding imdur prescription.

## 2018-08-12 NOTE — Patient Instructions (Signed)
Medication Instructions:  Your physician recommends that you continue on your current medications as directed. Please refer to the Current Medication list given to you today.   We will call regarding what dose of imdur to start   If you need a refill on your cardiac medications before your next appointment, please call your pharmacy.   Lab work: Your physician recommends that you return for lab work today: bmp, tsh, lft   If you have labs (blood work) drawn today and your tests are completely normal, you will receive your results only by: [name] MyChart Message (if you have MyChart) OR . A paper copy in the mail If you have any lab test that is abnormal or we need to change your treatment, we will call you to review the results.  Testing/Procedures: None.   Follow-Up: At [facility], you and your health needs are our priority.  As part of our continuing mission to provide you with exceptional heart care, we have created designated Provider Care Teams.  These Care Teams include your primary Cardiologist (physician) and Advanced Practice Providers (APPs -  Physician Assistants and Nurse Practitioners) who all work together to provide you with the care you need, when you need it. You will need a follow up appointment in 1 months.  Please call our office 2 months in advance to schedule this appointment.  You may see No primary care provider on file. or another member of our [employer] in [place]: Jenne Campus, MD . Shirlee More, MD  Any Other Special Instructions Will Be Listed Below (If Applicable).  Please keep a log of your daily blood pressures.    Isosorbide Mononitrate extended-release tablets What is this medicine? ISOSORBIDE MONONITRATE (eye soe SOR bide mon oh NYE trate) is a vasodilator. It relaxes blood vessels, increasing the blood and oxygen supply to your heart. This medicine is used to prevent chest pain caused by angina. It will not help to stop an  episode of chest pain. This medicine may be used for other purposes; ask your health care provider or pharmacist if you have questions. COMMON BRAND NAME(S): Imdur, Isotrate ER What should I tell my health care provider before I take this medicine? They need to know if you have any of these conditions: -previous heart attack or heart failure -an unusual or allergic reaction to isosorbide mononitrate, nitrates, other medicines, foods, dyes, or preservatives -pregnant or trying to get pregnant -breast-feeding How should I use this medicine? Take this medicine by mouth with a glass of water. Follow the directions on the prescription label. Do not crush or chew. Take your medicine at regular intervals. Do not take your medicine more often than directed. Do not stop taking this medicine except on the advice of your doctor or health care professional. Talk to your pediatrician regarding the use of this medicine in children. Special care may be needed. Overdosage: If you think you have taken too much of this medicine contact a poison control center or emergency room at once. NOTE: This medicine is only for you. Do not share this medicine with others. What if I miss a dose? If you miss a dose, take it as soon as you can. If it is almost time for your next dose, take only that dose. Do not take double or extra doses. What may interact with this medicine? Do not take this medicine with any of the following medications: -medicines used to treat erectile dysfunction (ED) like avanafil, sildenafil, tadalafil,  and vardenafil -riociguat This medicine may also interact with the following medications: -medicines for high blood pressure -other medicines for angina or heart failure This list may not describe all possible interactions. Give your health care provider a list of all the medicines, herbs, non-prescription drugs, or dietary supplements you use. Also tell them if you smoke, drink alcohol, or use  illegal drugs. Some items may interact with your medicine. What should I watch for while using this medicine? Check your heart rate and blood pressure regularly while you are taking this medicine. Ask your doctor or health care professional what your heart rate and blood pressure should be and when you should contact him or her. Tell your doctor or health care professional if you feel your medicine is no longer working. You may get dizzy. Do not drive, use machinery, or do anything that needs mental alertness until you know how this medicine affects you. To reduce the risk of dizzy or fainting spells, do not sit or stand up quickly, especially if you are an older patient. Alcohol can make you more dizzy, and increase flushing and rapid heartbeats. Avoid alcoholic drinks. Do not treat yourself for coughs, colds, or pain while you are taking this medicine without asking your doctor or health care professional for advice. Some ingredients may increase your blood pressure. What side effects may I notice from receiving this medicine? Side effects that you should report to your doctor or health care professional as soon as possible: -bluish discoloration of lips, fingernails, or palms of hands -irregular heartbeat, palpitations -low blood pressure -nausea, vomiting -persistent headache -unusually weak or tired Side effects that usually do not require medical attention (report to your doctor or health care professional if they continue or are bothersome): -flushing of the face or neck -rash This list may not describe all possible side effects. Call your doctor for medical advice about side effects. You may report side effects to FDA at 1-800-FDA-1088. Where should I keep my medicine? Keep out of the reach of children. Store between 15 and 30 degrees C (59 and 86 degrees F). Keep container tightly closed. Throw away any unused medicine after the expiration date. NOTE: This sheet is a summary. It may not  cover all possible information. If you have questions about this medicine, talk to your doctor, pharmacist, or health care provider.  2019 Elsevier/Gold Standard (2013-05-07 14:48:19)

## 2018-08-13 ENCOUNTER — Telehealth: Payer: Self-pay

## 2018-08-13 LAB — HEPATIC FUNCTION PANEL
ALT: 28 IU/L (ref 0–44)
AST: 20 IU/L (ref 0–40)
Albumin: 4.4 g/dL (ref 3.5–4.6)
Alkaline Phosphatase: 82 IU/L (ref 39–117)
BILIRUBIN TOTAL: 0.4 mg/dL (ref 0.0–1.2)
BILIRUBIN, DIRECT: 0.14 mg/dL (ref 0.00–0.40)
TOTAL PROTEIN: 6.7 g/dL (ref 6.0–8.5)

## 2018-08-13 LAB — BASIC METABOLIC PANEL
BUN / CREAT RATIO: 16 (ref 10–24)
BUN: 16 mg/dL (ref 10–36)
CO2: 22 mmol/L (ref 20–29)
CREATININE: 0.97 mg/dL (ref 0.76–1.27)
Calcium: 9.6 mg/dL (ref 8.6–10.2)
Chloride: 100 mmol/L (ref 96–106)
GFR calc Af Amer: 79 mL/min/{1.73_m2} (ref >59)
GFR calc non Af Amer: 68 mL/min/{1.73_m2} (ref >59)
Glucose: 292 mg/dL — ABNORMAL HIGH (ref 65–99)
Potassium: 4.2 mmol/L (ref 3.5–5.2)
Sodium: 138 mmol/L (ref 134–144)

## 2018-08-13 LAB — TSH: TSH: 3.76 u[IU]/mL (ref 0.450–4.500)

## 2018-08-13 NOTE — Telephone Encounter (Signed)
Patient called and notified of lab results. 

## 2018-08-13 NOTE — Telephone Encounter (Signed)
-----   Message from Jenean Lindau, MD sent at 08/13/2018  8:09 AM EST ----- The results of the study is unremarkable. Please inform patient. I will discuss in detail at next appointment. Cc  primary care/referring physician Jenean Lindau, MD 08/13/2018 8:09 AM

## 2018-08-13 NOTE — Telephone Encounter (Signed)
Left message for patient to return call.

## 2018-08-14 MED ORDER — ISOSORBIDE MONONITRATE ER 30 MG PO TB24: 15.0000 mg | ORAL_TABLET | Freq: Every day | ORAL | 0 refills | Status: DC

## 2018-08-14 NOTE — Telephone Encounter (Signed)
Patient informed to start 15 mg daily of imdur per Dr. Geraldo Pitter. Patient verbally understands.

## 2018-08-14 NOTE — Addendum Note (Signed)
Addended by: Linna Hoff R on: 08/14/2018 10:14 AM   Modules accepted: Orders

## 2018-09-08 ENCOUNTER — Other Ambulatory Visit: Payer: Self-pay | Admitting: Family Medicine

## 2018-09-10 ENCOUNTER — Other Ambulatory Visit: Payer: Self-pay | Admitting: Family Medicine

## 2018-09-17 ENCOUNTER — Ambulatory Visit: Payer: Medicare Other | Admitting: Cardiology

## 2018-09-22 ENCOUNTER — Encounter: Payer: Self-pay | Admitting: Cardiology

## 2018-09-22 ENCOUNTER — Ambulatory Visit (INDEPENDENT_AMBULATORY_CARE_PROVIDER_SITE_OTHER): Payer: Medicare Other | Admitting: Family Medicine

## 2018-09-22 ENCOUNTER — Encounter: Payer: Self-pay | Admitting: Family Medicine

## 2018-09-22 ENCOUNTER — Other Ambulatory Visit: Payer: Self-pay | Admitting: Cardiology

## 2018-09-22 ENCOUNTER — Ambulatory Visit (INDEPENDENT_AMBULATORY_CARE_PROVIDER_SITE_OTHER): Payer: Medicare Other | Admitting: Cardiology

## 2018-09-22 VITALS — BP 142/78 | HR 70 | Temp 98.6°F | Ht 71.0 in | Wt 170.0 lb

## 2018-09-22 VITALS — BP 150/64 | HR 66 | Ht 71.0 in | Wt 167.0 lb

## 2018-09-22 DIAGNOSIS — I255 Ischemic cardiomyopathy: Secondary | ICD-10-CM | POA: Diagnosis not present

## 2018-09-22 DIAGNOSIS — I472 Ventricular tachycardia: Secondary | ICD-10-CM

## 2018-09-22 DIAGNOSIS — E119 Type 2 diabetes mellitus without complications: Secondary | ICD-10-CM | POA: Diagnosis not present

## 2018-09-22 DIAGNOSIS — E039 Hypothyroidism, unspecified: Secondary | ICD-10-CM

## 2018-09-22 DIAGNOSIS — G609 Hereditary and idiopathic neuropathy, unspecified: Secondary | ICD-10-CM

## 2018-09-22 DIAGNOSIS — Z0000 Encounter for general adult medical examination without abnormal findings: Secondary | ICD-10-CM | POA: Diagnosis not present

## 2018-09-22 DIAGNOSIS — I10 Essential (primary) hypertension: Secondary | ICD-10-CM

## 2018-09-22 DIAGNOSIS — I2510 Atherosclerotic heart disease of native coronary artery without angina pectoris: Secondary | ICD-10-CM

## 2018-09-22 DIAGNOSIS — Z8546 Personal history of malignant neoplasm of prostate: Secondary | ICD-10-CM

## 2018-09-22 DIAGNOSIS — M8580 Other specified disorders of bone density and structure, unspecified site: Secondary | ICD-10-CM

## 2018-09-22 DIAGNOSIS — I739 Peripheral vascular disease, unspecified: Secondary | ICD-10-CM

## 2018-09-22 DIAGNOSIS — E782 Mixed hyperlipidemia: Secondary | ICD-10-CM | POA: Diagnosis not present

## 2018-09-22 DIAGNOSIS — I4729 Other ventricular tachycardia: Secondary | ICD-10-CM

## 2018-09-22 MED ORDER — ISOSORBIDE MONONITRATE ER 30 MG PO TB24: 30.0000 mg | ORAL_TABLET | Freq: Every day | ORAL | 0 refills | Status: DC

## 2018-09-22 NOTE — Patient Instructions (Signed)
Medication Instructions:  Your physician has recommended you make the following change in your medication:   Start: Imdur 30 mg daily  If you need a refill on your cardiac medications before your next appointment, please call your pharmacy.   Lab work: Your physician recommends that you return for lab work today: bmp, cbc, tsh, lft, and lipids  If you have labs (blood work) drawn today and your tests are completely normal, you will receive your results only by: [name] MyChart Message (if you have MyChart) OR . A paper copy in the mail If you have any lab test that is abnormal or we need to change your treatment, we will call you to review the results.  Testing/Procedures: None.   Follow-Up: At [facility], you and your health needs are our priority.  As part of our continuing mission to provide you with exceptional heart care, we have created designated Provider Care Teams.  These Care Teams include your primary Cardiologist (physician) and Advanced Practice Providers (APPs -  Physician Assistants and Nurse Practitioners) who all work together to provide you with the care you need, when you need it. You will need a follow up appointment in 6 months.  Please call our office 2 months in advance to schedule this appointment.  You may see No primary care provider on file. or another member of our [employer] in [place]: Jenne Campus, MD . Shirlee More, MD  Any Other Special Instructions Will Be Listed Below (If Applicable).  Isosorbide Mononitrate extended-release tablets What is this medicine? ISOSORBIDE MONONITRATE (eye soe SOR bide mon oh NYE trate) is a vasodilator. It relaxes blood vessels, increasing the blood and oxygen supply to your heart. This medicine is used to prevent chest pain caused by angina. It will not help to stop an episode of chest pain. This medicine may be used for other purposes; ask your health care provider or pharmacist if you have  questions. COMMON BRAND NAME(S): Imdur, Isotrate ER What should I tell my health care provider before I take this medicine? They need to know if you have any of these conditions: -previous heart attack or heart failure -an unusual or allergic reaction to isosorbide mononitrate, nitrates, other medicines, foods, dyes, or preservatives -pregnant or trying to get pregnant -breast-feeding How should I use this medicine? Take this medicine by mouth with a glass of water. Follow the directions on the prescription label. Do not crush or chew. Take your medicine at regular intervals. Do not take your medicine more often than directed. Do not stop taking this medicine except on the advice of your doctor or health care professional. Talk to your pediatrician regarding the use of this medicine in children. Special care may be needed. Overdosage: If you think you have taken too much of this medicine contact a poison control center or emergency room at once. NOTE: This medicine is only for you. Do not share this medicine with others. What if I miss a dose? If you miss a dose, take it as soon as you can. If it is almost time for your next dose, take only that dose. Do not take double or extra doses. What may interact with this medicine? Do not take this medicine with any of the following medications: -medicines used to treat erectile dysfunction (ED) like avanafil, sildenafil, tadalafil, and vardenafil -riociguat This medicine may also interact with the following medications: -medicines for high blood pressure -other medicines for angina or heart failure This list may  not describe all possible interactions. Give your health care provider a list of all the medicines, herbs, non-prescription drugs, or dietary supplements you use. Also tell them if you smoke, drink alcohol, or use illegal drugs. Some items may interact with your medicine. What should I watch for while using this medicine? Check your heart  rate and blood pressure regularly while you are taking this medicine. Ask your doctor or health care professional what your heart rate and blood pressure should be and when you should contact him or her. Tell your doctor or health care professional if you feel your medicine is no longer working. You may get dizzy. Do not drive, use machinery, or do anything that needs mental alertness until you know how this medicine affects you. To reduce the risk of dizzy or fainting spells, do not sit or stand up quickly, especially if you are an older patient. Alcohol can make you more dizzy, and increase flushing and rapid heartbeats. Avoid alcoholic drinks. Do not treat yourself for coughs, colds, or pain while you are taking this medicine without asking your doctor or health care professional for advice. Some ingredients may increase your blood pressure. What side effects may I notice from receiving this medicine? Side effects that you should report to your doctor or health care professional as soon as possible: -bluish discoloration of lips, fingernails, or palms of hands -irregular heartbeat, palpitations -low blood pressure -nausea, vomiting -persistent headache -unusually weak or tired Side effects that usually do not require medical attention (report to your doctor or health care professional if they continue or are bothersome): -flushing of the face or neck -rash This list may not describe all possible side effects. Call your doctor for medical advice about side effects. You may report side effects to FDA at 1-800-FDA-1088. Where should I keep my medicine? Keep out of the reach of children. Store between 15 and 30 degrees C (59 and 86 degrees F). Keep container tightly closed. Throw away any unused medicine after the expiration date. NOTE: This sheet is a summary. It may not cover all possible information. If you have questions about this medicine, talk to your doctor, pharmacist, or health care  provider.  2019 Elsevier/Gold Standard (2013-05-07 14:48:19)

## 2018-09-22 NOTE — Patient Instructions (Signed)
Preventive Care 2 Years and Older, Male Preventive care refers to lifestyle choices and visits with your health care provider that can promote health and wellness. What does preventive care include?   A yearly physical exam. This is also called an annual well check.  Dental exams once or twice a year.  Routine eye exams. Ask your health care provider how often you should have your eyes checked.  Personal lifestyle choices, including: ? Daily care of your teeth and gums. ? Regular physical activity. ? Eating a healthy diet. ? Avoiding tobacco and drug use. ? Limiting alcohol use. ? Practicing safe sex. ? Taking low doses of aspirin every day. ? Taking vitamin and mineral supplements as recommended by your health care provider. What happens during an annual well check? The services and screenings done by your health care provider during your annual well check will depend on your age, overall health, lifestyle risk factors, and family history of disease. Counseling Your health care provider may ask you questions about your:  Alcohol use.  Tobacco use.  Drug use.  Emotional well-being.  Home and relationship well-being.  Sexual activity.  Eating habits.  History of falls.  Memory and ability to understand (cognition).  Work and work Statistician. Screening You may have the following tests or measurements:  Height, weight, and BMI.  Blood pressure.  Lipid and cholesterol levels. These may be checked every 5 years, or more frequently if you are over 9 years old.  Skin check.  Lung cancer screening. You may have this screening every year starting at age 57 if you have a 30-pack-year history of smoking and currently smoke or have quit within the past 15 years.  Colorectal cancer screening. All adults should have this screening starting at age 90 and continuing until age 69. You will have tests every 1-10 years, depending on your results and the type of screening  test. People at increased risk should start screening at an earlier age. Screening tests may include: ? Guaiac-based fecal occult blood testing. ? Fecal immunochemical test (FIT). ? Stool DNA test. ? Virtual colonoscopy. ? Sigmoidoscopy. During this test, a flexible tube with a tiny camera (sigmoidoscope) is used to examine your rectum and lower colon. The sigmoidoscope is inserted through your anus into your rectum and lower colon. ? Colonoscopy. During this test, a long, thin, flexible tube with a tiny camera (colonoscope) is used to examine your entire colon and rectum.  Prostate cancer screening. Recommendations will vary depending on your family history and other risks.  Hepatitis C blood test.  Hepatitis B blood test.  Sexually transmitted disease (STD) testing.  Diabetes screening. This is done by checking your blood sugar (glucose) after you have not eaten for a while (fasting). You may have this done every 1-3 years.  Abdominal aortic aneurysm (AAA) screening. You may need this if you are a current or former smoker.  Osteoporosis. You may be screened starting at age 30 if you are at high risk. Talk with your health care provider about your test results, treatment options, and if necessary, the need for more tests. Vaccines Your health care provider may recommend certain vaccines, such as:  Influenza vaccine. This is recommended every year.  Tetanus, diphtheria, and acellular pertussis (Tdap, Td) vaccine. You may need a Td booster every 10 years.  Varicella vaccine. You may need this if you have not been vaccinated.  Zoster vaccine. You may need this after age 42.  Measles, mumps, and rubella (MMR) vaccine.  You may need at least one dose of MMR if you were born in 1955 or later. You may also need a second dose.  Pneumococcal 13-valent conjugate (PCV13) vaccine. One dose is recommended after age 65.  Pneumococcal polysaccharide (PPSV23) vaccine. One dose is recommended  after age 65.  Meningococcal vaccine. You may need this if you have certain conditions.  Hepatitis A vaccine. You may need this if you have certain conditions or if you travel or work in places where you may be exposed to hepatitis A.  Hepatitis B vaccine. You may need this if you have certain conditions or if you travel or work in places where you may be exposed to hepatitis B.  Haemophilus influenzae type b (Hib) vaccine. You may need this if you have certain risk factors. Talk to your health care provider about which screenings and vaccines you need and how often you need them. This information is not intended to replace advice given to you by your health care provider. Make sure you discuss any questions you have with your health care provider. Document Released: 08/04/2015 Document Revised: 08/28/2017 Document Reviewed: 05/09/2015 Elsevier Interactive Patient Education  2019 [employer]vier Inc.  

## 2018-09-22 NOTE — Progress Notes (Signed)
Patient ID: Evan Wright, male    DOB: 12/21/1924  Age: 85 y.o. MRN: [id number]    Subjective:  Subjective  HPI Evan Wright presents for cpe.  Pt just saw cardiology today as well.  He is also seeing Dr Delice Lesch in neuro.  He is walking with a cane and uses a walker at home   Review of Systems  Constitutional: Negative for activity change, appetite change and fatigue.  HENT: Negative for hearing loss, congestion, tinnitus and ear discharge.  dentist q26m Eyes: Negative for visual disturbance (see optho q1y -- Respiratory: Negative for cough, chest tightness and shortness of breath.   Cardiovascular: Negative for chest pain, palpitations and leg swelling.  Gastrointestinal: Negative for abdominal pain, diarrhea, constipation and abdominal distention.  Genitourinary: Negative for urgency, frequency, decreased urine volume and difficulty urinating.  Musculoskeletal: Negative for back pain, arthralgias and gait problem.  Skin: Negative for color change, pallor and rash.  Neurological: + numbness in feet-- not new -- balance issues  Hematological: Negative for adenopathy. Does not bruise/bleed easily.  Psychiatric/Behavioral: Negative for suicidal ideas, confusion, sleep disturbance, self-injury, dysphoric mood, decreased concentration and agitation.      History Past Medical History:  Diagnosis Date  . Adenomatous colon polyp   . Angina pectoris ([place]) 04/2017  . CAD (coronary artery disease)    a. Cath 04/29/17-> high grade pLAD with R to L collaterals, high grade 1st dig  2. Cath 05/01/17 s/p ostial to mLAD with DES x 2.   . Cardiac murmur   . Coronary artery disease   . Diverticulosis   . GERD (gastroesophageal reflux disease)   . History of hiatal hernia   . Hyperlipidemia   . Loss of height 06/11/2016  . Osteoarthritis   . Prostate cancer [facility])     He has a past surgical history that includes Prostatectomy; Tonsillectomy; Eye surgery (2012); Appendectomy (1948); Colonoscopy;  LEFT HEART CATH AND CORONARY ANGIOGRAPHY (N/A, 04/29/2017); and CORONARY STENT INTERVENTION (N/A, 05/01/2017).   His family history includes Colitis in his mother; Colon cancer in his brother; Lung cancer in his father; Prostate cancer in his father.He reports that he has never smoked. He has never used smokeless tobacco. He reports that he does not drink alcohol or use drugs.  Current Outpatient Medications on File Prior to Visit  Medication Sig Dispense Refill  . aspirin EC 81 MG tablet Take 1 tablet (81 mg total) by mouth daily. 30 tablet 0  . Calcium-Phosphorus-Vitamin D (CITRACAL +D3 PO) Take 1 tablet by mouth daily with lunch.    . carvedilol (COREG) 3.125 MG tablet TAKE ONE TABLET BY MOUTH TWICE DAILY 180 tablet 1  . cholecalciferol (VITAMIN D) 400 units TABS tablet Take 400 Units by mouth daily with lunch.    . clopidogrel (PLAVIX) 75 MG tablet TAKE ONE TABLET BY MOUTH EVERY DAY 90 tablet 1  . lansoprazole (PREVACID) 30 MG capsule TAKE ONE CAPSULE BY MOUTH EVERY DAY AT 12 NOON WITH FOOD 90 capsule 1  . levothyroxine (SYNTHROID, LEVOTHROID) 50 MCG tablet TAKE ONE TABLET BY MOUTH DAILY 90 tablet 1  . Multiple Vitamins-Minerals (ICAPS AREDS 2 PO) Take 1 tablet by mouth daily with lunch.    . nitroGLYCERIN (NITROSTAT) 0.4 MG SL tablet Place 1 tablet (0.4 mg total) under the tongue every 5 (five) minutes as needed for chest pain. 25 tablet 11  . Psyllium (METAMUCIL PO) Take 1 Dose by mouth daily.     [name] atorvastatin (LIPITOR) 80 MG tablet  Take 1 tablet (80 mg total) by mouth daily. 90 tablet 1   No current facility-administered medications on file prior to visit.      Objective:  Objective  Physical Exam Vitals signs and nursing note reviewed.  Constitutional:      General: He is not in acute distress.    Appearance: He is well-developed. He is not diaphoretic.  HENT:     Head: Normocephalic and atraumatic.     Right Ear: External ear normal.     Left Ear: External ear normal.      Nose: Nose normal.     Mouth/Throat:     Pharynx: No oropharyngeal exudate.  Eyes:     General:        Right eye: No discharge.        Left eye: No discharge.     Conjunctiva/sclera: Conjunctivae normal.     Pupils: Pupils are equal, round, and reactive to light.  Neck:     Musculoskeletal: Normal range of motion and neck supple.     Thyroid: No thyromegaly.     Vascular: No JVD.  Cardiovascular:     Rate and Rhythm: Normal rate and regular rhythm.     Heart sounds: No murmur. No friction rub. No gallop.   Pulmonary:     Effort: Pulmonary effort is normal. No respiratory distress.     Breath sounds: Normal breath sounds. No wheezing or rales.  Chest:     Chest wall: No tenderness.  Abdominal:     General: Bowel sounds are normal. There is no distension.     Palpations: Abdomen is soft. There is no mass.     Tenderness: There is no abdominal tenderness. There is no guarding or rebound.  Genitourinary:    Comments: Per urology Musculoskeletal: Normal range of motion.        General: No tenderness.  Lymphadenopathy:     Cervical: No cervical adenopathy.  Skin:    General: Skin is warm and dry.     Coloration: Skin is not pale.     Findings: No erythema or rash.  Neurological:     Mental Status: He is alert and oriented to person, place, and time.     Motor: No weakness or abnormal muscle tone.     Deep Tendon Reflexes: Reflexes are normal and symmetric. Reflexes normal.     Comments: Per neuro dec sensation / vibratory sensation in low ext Gait -- small steps   Psychiatric:        Behavior: Behavior normal.        Thought Content: Thought content normal.        Judgment: Judgment normal.    BP (!) 142/78   Pulse 70   Temp 98.6 F (37 C)   Ht 5\' 11"  (1.803 m)   Wt 170 lb (77.1 kg)   SpO2 98%   BMI 23.71 kg/m  Wt Readings from Last 3 Encounters:  09/22/18 170 lb (77.1 kg)  09/22/18 167 lb (75.8 kg)  08/12/18 174 lb (78.9 kg)     Lab Results  Component Value  Date   WBC 5.5 09/22/2018   HGB 14.7 09/22/2018   HCT 43.9 09/22/2018   PLT 221 09/22/2018   GLUCOSE 364 (H) 09/22/2018   CHOL 147 09/22/2018   TRIG 160 (H) 09/22/2018   HDL 41 09/22/2018   LDLDIRECT 165.3 10/08/2010   LDLCALC 74 09/22/2018   ALT 27 09/22/2018   AST 17 09/22/2018   NA 140  09/22/2018   K 4.6 09/22/2018   CL 101 09/22/2018   CREATININE 1.19 09/22/2018   BUN 16 09/22/2018   CO2 21 09/22/2018   TSH 4.240 09/22/2018   PSA 0.01 (L) 06/11/2016   INR 1.09 04/30/2017   HGBA1C 6.4 (H) 12/25/2017    Dg Bone Density  Result Date: 07/01/2018 EXAM: DUAL X-RAY ABSORPTIOMETRY (DXA) FOR BONE MINERAL DENSITY IMPRESSION: Evan Wright Your patient Evan Wright completed a BMD test on 07/01/2018 using the [place] (analysis version: 16.SP2) manufactured by [employer]. The following summarizes the results of our evaluation. PATIENT: Name: Evan Wright, Evan Wright Patient ID: [id number] Birth Date: 12/15/1924 Height: 71.0 in. Gender: Male Measured: 07/01/2018 Weight: 170.0 lbs. Indications: Advanced Age, Caucasian, Family Hist. (Parent hip fracture), Height Loss, History of Osteopenia, Hypothyroidism Fractures: Treatments: Calcium, Vitamin D ASSESSMENT: The BMD measured at Femur Neck Left is 0.751 g/cm2 with a T-score of -2.1. This patient is considered osteopenic according to [facility]) criteria. Lumbar spine was not utilized due to advanced degenerative changes. Scan quality was good. A FRAX was not done due to the patient not being within the FRAX age range. Site Region Measured Date Measured Age WHO YA BMD Classification T-score DualFemur Neck Left 07/01/2018 91.8 Osteopenia -2.1 0.751 g/cm2 DualFemur Neck Left 8/11/2016 89.7 Osteopenia -2.0 0.761 g/cm2 DualFemur Total Mean 07/01/2018 91.8 Osteopenia -1.7 0.792 g/cm2 DualFemur Total Mean 06/11/2016 89.7 Osteopenia -1.7 0.791 g/cm2 Left Forearm Radius 33% 07/01/2018 91.8 Osteopenia -2.0 0.704 g/cm2 Left  Forearm Radius 33% 06/11/2016 89.7 Osteopenia -2.0 0.697 g/cm2 World Health Organization [facility]) criteria for post-menopausal, Caucasian Women: Normal       T-score at or above -1 SD Osteopenia   T-score between -1 and -2.5 SD Osteoporosis T-score at or below -2.5 SD RECOMMENDATION: 1. All patients should optimize calcium and vitamin D intake. 2. Consider FDA-approved medical therapies in postmenopausal women and men aged 68 years and older, based on the following: a. A hip or vertebral(clinical or morphometric) fracture. b. T-Score < -2.5 at the femoral neck or spine after appropriate evaluation to exclude secondary causes c. Low bone mass (T-score between -1.0 and -2.5 at the femoral neck or spine) and a 10 year probability of a hip fracture >3% or a 10 year probability of major osteoporosis-related fracture > 20% based on the [place]-adapted WHO algorithm d. Clinical judgement and/or patient preferences may indicate treatment for people with 10-year fracture probabilities above or below these levels FOLLOW-UP: Patients with diagnosis of osteoporosis or at high risk for fracture should have regular bone mineral density tests. For patients eligible for Medicare, routine testing is allowed once every 2 years. The testing frequency can be increased to one year for patients who have rapidly progressing disease, those who are receiving or discontinuing medical therapy to restore bone mass, or have additional risk factors. I have reviewed this report, anf agree with the above findings. [facility] Radiology Electronically Signed   By: Rolm Baptise M.D.   On: 07/01/2018 12:08     Assessment & Plan:  Plan  I am having Evan Wright maintain his Psyllium (METAMUCIL PO), aspirin EC, nitroGLYCERIN, Multiple Vitamins-Minerals (ICAPS AREDS 2 PO), Calcium-Phosphorus-Vitamin D (CITRACAL +D3 PO), cholecalciferol, atorvastatin, clopidogrel, lansoprazole, carvedilol, levothyroxine, and isosorbide mononitrate.  No orders of the  defined types were placed in this encounter.   Problem List Items Addressed This Visit      High   Diet-controlled diabetes mellitus (Evan Wright)    Check labs ---  drawn in cardiology Add hgba1c       Relevant Orders   Microalbumin / creatinine urine ratio   Essential hypertension    Well controlled, no changes to meds. Encouraged heart healthy diet such as the DASH diet and exercise as tolerated.       Hypothyroidism    con't meds Check labs      Nonsustained ventricular tachycardia [facility])    Per cardiology      Osteopenia    con't with ca and vita d Weight bearing exercise      Preventative health care - Primary    ghm utd Check labs See AVs        Relevant Orders   Hemoglobin A1c     Unprioritized   Idiopathic peripheral neuropathy    Per neuro      PROSTATE CANCER, HX OF    Sees urology regularly          Follow-up: Return in about 6 months (around 03/25/2019), or if symptoms worsen or fail to improve, for hypertension, hyperlipidemia, diabetes II.  [name], DO

## 2018-09-22 NOTE — Progress Notes (Signed)
Cardiology Office Note:    Date:  09/22/2018   ID:  Evan Wright, DOB 02-19-1925, MRN [id number]  PCP:  Carollee Herter, Alferd Apa, DO  Cardiologist:  Jenean Lindau, MD   Referring MD: Carollee Herter, Alferd Apa, *    ASSESSMENT:    1. Coronary artery disease involving native coronary artery of native heart without angina pectoris   2. Essential hypertension   3. Ischemic cardiomyopathy   4. Nonsustained ventricular tachycardia ([place])   5. Mixed dyslipidemia    PLAN:    In order of problems listed above:  1. Secondary prevention stressed with the patient.  Importance of compliance with diet and medication stressed and he vocalized understanding.  His blood pressure is stable.  He is elevated he has brought home readings also.  In this text have asked the patient to increase Imdur to 30 mg daily and prescription was given. 2. He will have blood work today including fasting lipids 3. Patient will be seen in follow-up appointment in 6 months or earlier if the patient has any concerns    Medication Adjustments/Labs and Tests Ordered: Current medicines are reviewed at length with the patient today.  Concerns regarding medicines are outlined above.  No orders of the defined types were placed in this encounter.  No orders of the defined types were placed in this encounter.    No chief complaint on file.    History of Present Illness:    Evan Wright is a 85 y.o. male.  Patient has known coronary artery disease he denies any problems at this time.  No chest pain orthopnea or PND.  He leads a sedentary lifestyle.  He is ambulating age appropriately.  He occasionally has chest discomfort which may or may not be related to exertion.  He is taking Imdur 15 mg daily.  Past Medical History:  Diagnosis Date  . Adenomatous colon polyp   . Angina pectoris ([place]) 04/2017  . CAD (coronary artery disease)    a. Cath 04/29/17-> high grade pLAD with R to L collaterals, high grade 1st dig  2. Cath  05/01/17 s/p ostial to mLAD with DES x 2.   . Cardiac murmur   . Coronary artery disease   . Diverticulosis   . GERD (gastroesophageal reflux disease)   . History of hiatal hernia   . Hyperlipidemia   . Loss of height 06/11/2016  . Osteoarthritis   . Prostate cancer [facility])     Past Surgical History:  Procedure Laterality Date  . APPENDECTOMY  1948  . COLONOSCOPY    . CORONARY STENT INTERVENTION N/A 05/01/2017   Procedure: CORONARY STENT INTERVENTION;  Surgeon: [name], [name], MD;  Location: [place] CV LAB;  Service: Cardiovascular;  Laterality: N/A;  . EYE SURGERY  2012   march and April  --Dr [name]  . LEFT HEART CATH AND CORONARY ANGIOGRAPHY N/A 04/29/2017   Procedure: LEFT HEART CATH AND CORONARY ANGIOGRAPHY;  Surgeon: [name], MD;  Location: [place] CV LAB;  Service: Cardiovascular;  Laterality: N/A;  . PROSTATECTOMY    . TONSILLECTOMY      Current Medications: Current Meds  Medication Sig  . aspirin EC 81 MG tablet Take 1 tablet (81 mg total) by mouth daily.  . Calcium-Phosphorus-Vitamin D (CITRACAL +D3 PO) Take 1 tablet by mouth daily with lunch.  . carvedilol (COREG) 3.125 MG tablet TAKE ONE TABLET BY MOUTH TWICE DAILY  . cholecalciferol (VITAMIN D) 400 units TABS tablet Take 400  Units by mouth daily with lunch.  . clopidogrel (PLAVIX) 75 MG tablet TAKE ONE TABLET BY MOUTH EVERY DAY  . isosorbide mononitrate (IMDUR) 30 MG 24 hr tablet Take 0.5 tablets (15 mg total) by mouth daily.  . lansoprazole (PREVACID) 30 MG capsule TAKE ONE CAPSULE BY MOUTH EVERY DAY AT 12 NOON WITH FOOD  . levothyroxine (SYNTHROID, LEVOTHROID) 50 MCG tablet TAKE ONE TABLET BY MOUTH DAILY  . Multiple Vitamins-Minerals (ICAPS AREDS 2 PO) Take 1 tablet by mouth daily with lunch.  . nitroGLYCERIN (NITROSTAT) 0.4 MG SL tablet Place 1 tablet (0.4 mg total) under the tongue every 5 (five) minutes as needed for chest pain.  [name] Psyllium (METAMUCIL PO) Take 1 Dose by mouth daily.       Allergies:   Penicillins   Social History   Socioeconomic History  . Marital status: Married    Spouse name: Not on file  . Number of children: 2  . Years of education: Not on file  . Highest education level: Not on file  Occupational History  . Occupation: retired    Fish farm manager: RETIRED  Social Needs  . Financial resource strain: Not on file  . Food insecurity:    Worry: Not on file    Inability: Not on file  . Transportation needs:    Medical: Not on file    Non-medical: Not on file  Tobacco Use  . Smoking status: Never Smoker  . Smokeless tobacco: Never Used  Substance and Sexual Activity  . Alcohol use: No  . Drug use: No  . Sexual activity: Not on file  Lifestyle  . Physical activity:    Days per week: Not on file    Minutes per session: Not on file  . Stress: Not on file  Relationships  . Social connections:    Talks on phone: Not on file    Gets together: Not on file    Attends religious service: Not on file    Active member of club or organization: Not on file    Attends meetings of clubs or organizations: Not on file    Relationship status: Not on file  Other Topics Concern  . Not on file  Social History Narrative  . Not on file     Family History: The patient's family history includes Colitis in his mother; Colon cancer in his brother; Lung cancer in his father; Prostate cancer in his father.  ROS:   Please see the history of present illness.    All other systems reviewed and are negative.  EKGs/Labs/Other Studies Reviewed:    The following studies were reviewed today: I discussed my findings with the patient at extensive length.   Recent Labs: 12/29/2017: Hemoglobin 15.9; Platelets 190 08/12/2018: ALT 28; BUN 16; Creatinine, Ser 0.97; Potassium 4.2; Sodium 138; TSH 3.760  Recent Lipid Panel    Component Value Date/Time   CHOL 165 12/25/2017 1050   TRIG 126 12/25/2017 1050   HDL 44 12/25/2017 1050   CHOLHDL 3.8 12/25/2017 1050   CHOLHDL  3 09/25/2017 1035   VLDL 25.2 09/25/2017 1035   LDLCALC 96 12/25/2017 1050   LDLDIRECT 165.3 10/08/2010 1008    Physical Exam:    VS:  BP (!) 150/64 (BP Location: Right Arm, Patient Position: Sitting, Cuff Size: Normal)   Pulse 66   Ht 5\' 11"  (1.803 m)   Wt 167 lb (75.8 kg)   SpO2 98%   BMI 23.29 kg/m     Wt Readings from Last  3 Encounters:  09/22/18 167 lb (75.8 kg)  08/12/18 174 lb (78.9 kg)  07/10/18 174 lb (78.9 kg)     GEN: Patient is in no acute distress HEENT: Normal NECK: No JVD; No carotid bruits LYMPHATICS: No lymphadenopathy CARDIAC: Hear sounds regular, 2/6 systolic murmur at the apex. RESPIRATORY:  Clear to auscultation without rales, wheezing or rhonchi  ABDOMEN: Soft, non-tender, non-distended MUSCULOSKELETAL:  No edema; No deformity  SKIN: Warm and dry NEUROLOGIC:  Alert and oriented x 3 PSYCHIATRIC:  Normal affect   Signed, Jenean Lindau, MD  09/22/2018 10:30 AM    [place]

## 2018-09-23 DIAGNOSIS — Z0000 Encounter for general adult medical examination without abnormal findings: Secondary | ICD-10-CM | POA: Insufficient documentation

## 2018-09-23 DIAGNOSIS — E1165 Type 2 diabetes mellitus with hyperglycemia: Secondary | ICD-10-CM | POA: Insufficient documentation

## 2018-09-23 DIAGNOSIS — E119 Type 2 diabetes mellitus without complications: Secondary | ICD-10-CM

## 2018-09-23 HISTORY — DX: Type 2 diabetes mellitus with hyperglycemia: E11.65

## 2018-09-23 HISTORY — DX: Encounter for general adult medical examination without abnormal findings: Z00.00

## 2018-09-23 HISTORY — DX: Type 2 diabetes mellitus without complications: E11.9

## 2018-09-23 LAB — LIPID PANEL
Chol/HDL Ratio: 3.6 ratio (ref 0.0–5.0)
Cholesterol, Total: 147 mg/dL (ref 100–199)
HDL: 41 mg/dL (ref >39)
LDL Calculated: 74 mg/dL (ref 0–99)
Triglycerides: 160 mg/dL — ABNORMAL HIGH (ref 0–149)
VLDL Cholesterol Cal: 32 mg/dL (ref 5–40)

## 2018-09-23 LAB — BASIC METABOLIC PANEL
BUN/Creatinine Ratio: 13 (ref 10–24)
BUN: 16 mg/dL (ref 10–36)
CALCIUM: 10.1 mg/dL (ref 8.6–10.2)
CO2: 21 mmol/L (ref 20–29)
CREATININE: 1.19 mg/dL (ref 0.76–1.27)
Chloride: 101 mmol/L (ref 96–106)
GFR calc Af Amer: 61 mL/min/{1.73_m2} (ref >59)
GFR, EST NON AFRICAN AMERICAN: 53 mL/min/{1.73_m2} — AB (ref >59)
Glucose: 364 mg/dL — ABNORMAL HIGH (ref 65–99)
Potassium: 4.6 mmol/L (ref 3.5–5.2)
Sodium: 140 mmol/L (ref 134–144)

## 2018-09-23 LAB — HEPATIC FUNCTION PANEL
ALK PHOS: 91 IU/L (ref 39–117)
ALT: 27 IU/L (ref 0–44)
AST: 17 IU/L (ref 0–40)
Albumin: 4.7 g/dL — ABNORMAL HIGH (ref 3.5–4.6)
BILIRUBIN TOTAL: 0.7 mg/dL (ref 0.0–1.2)
BILIRUBIN, DIRECT: 0.21 mg/dL (ref 0.00–0.40)
Total Protein: 7.1 g/dL (ref 6.0–8.5)

## 2018-09-23 LAB — CBC
HEMOGLOBIN: 14.7 g/dL (ref 13.0–17.7)
Hematocrit: 43.9 % (ref 37.5–51.0)
MCH: 30.8 pg (ref 26.6–33.0)
MCHC: 33.5 g/dL (ref 31.5–35.7)
MCV: 92 fL (ref 79–97)
Platelets: 221 10*3/uL (ref 150–450)
RBC: 4.78 x10E6/uL (ref 4.14–5.80)
RDW: 11.7 % (ref 11.6–15.4)
WBC: 5.5 10*3/uL (ref 3.4–10.8)

## 2018-09-23 LAB — TSH: TSH: 4.24 u[IU]/mL (ref 0.450–4.500)

## 2018-09-23 LAB — MICROALBUMIN / CREATININE URINE RATIO
CREATININE, U: 71.1 mg/dL
MICROALB UR: 7 mg/dL — AB (ref 0.0–1.9)
Microalb Creat Ratio: 9.9 mg/g (ref 0.0–30.0)

## 2018-09-23 NOTE — Assessment & Plan Note (Signed)
Per neuro 

## 2018-09-23 NOTE — Assessment & Plan Note (Signed)
con't with ca and vita d Weight bearing exercise

## 2018-09-23 NOTE — Assessment & Plan Note (Signed)
Sees urology regularly

## 2018-09-23 NOTE — Assessment & Plan Note (Signed)
Per cardiology 

## 2018-09-23 NOTE — Assessment & Plan Note (Signed)
con't meds  Check labs 

## 2018-09-23 NOTE — Assessment & Plan Note (Signed)
Check labs --- drawn in cardiology Add hgba1c

## 2018-09-23 NOTE — Assessment & Plan Note (Addendum)
ghm utd Check labs  See AVs  

## 2018-09-23 NOTE — Assessment & Plan Note (Signed)
Well controlled, no changes to meds. Encouraged heart healthy diet such as the DASH diet and exercise as tolerated.  °

## 2018-09-28 ENCOUNTER — Other Ambulatory Visit: Payer: Self-pay

## 2018-09-28 ENCOUNTER — Encounter (HOSPITAL_BASED_OUTPATIENT_CLINIC_OR_DEPARTMENT_OTHER): Payer: Self-pay | Admitting: Emergency Medicine

## 2018-09-28 ENCOUNTER — Inpatient Hospital Stay (HOSPITAL_BASED_OUTPATIENT_CLINIC_OR_DEPARTMENT_OTHER)
Admission: EM | Admit: 2018-09-28 | Discharge: 2018-09-29 | DRG: 084 | Disposition: A | Discharge status: Home Health | Payer: Medicare Other | Attending: Internal Medicine | Admitting: Internal Medicine

## 2018-09-28 ENCOUNTER — Emergency Department (HOSPITAL_BASED_OUTPATIENT_CLINIC_OR_DEPARTMENT_OTHER): Payer: Medicare Other

## 2018-09-28 ENCOUNTER — Telehealth: Payer: Self-pay | Admitting: *Deleted

## 2018-09-28 DIAGNOSIS — Y92002 Bathroom of unspecified non-institutional (private) residence single-family (private) house as the place of occurrence of the external cause: Secondary | ICD-10-CM

## 2018-09-28 DIAGNOSIS — K219 Gastro-esophageal reflux disease without esophagitis: Secondary | ICD-10-CM | POA: Diagnosis not present

## 2018-09-28 DIAGNOSIS — R569 Unspecified convulsions: Secondary | ICD-10-CM

## 2018-09-28 DIAGNOSIS — R739 Hyperglycemia, unspecified: Secondary | ICD-10-CM | POA: Diagnosis not present

## 2018-09-28 DIAGNOSIS — S199XXA Unspecified injury of neck, initial encounter: Secondary | ICD-10-CM | POA: Diagnosis not present

## 2018-09-28 DIAGNOSIS — E119 Type 2 diabetes mellitus without complications: Secondary | ICD-10-CM | POA: Diagnosis present

## 2018-09-28 DIAGNOSIS — R2689 Other abnormalities of gait and mobility: Secondary | ICD-10-CM | POA: Diagnosis present

## 2018-09-28 DIAGNOSIS — Z8546 Personal history of malignant neoplasm of prostate: Secondary | ICD-10-CM

## 2018-09-28 DIAGNOSIS — S299XXA Unspecified injury of thorax, initial encounter: Secondary | ICD-10-CM | POA: Diagnosis not present

## 2018-09-28 DIAGNOSIS — S5001XA Contusion of right elbow, initial encounter: Secondary | ICD-10-CM | POA: Diagnosis not present

## 2018-09-28 DIAGNOSIS — S0003XA Contusion of scalp, initial encounter: Secondary | ICD-10-CM | POA: Diagnosis not present

## 2018-09-28 DIAGNOSIS — Z801 Family history of malignant neoplasm of trachea, bronchus and lung: Secondary | ICD-10-CM

## 2018-09-28 DIAGNOSIS — Z800 Family history of malignant neoplasm of digestive organs: Secondary | ICD-10-CM | POA: Diagnosis not present

## 2018-09-28 DIAGNOSIS — Z8042 Family history of malignant neoplasm of prostate: Secondary | ICD-10-CM | POA: Diagnosis not present

## 2018-09-28 DIAGNOSIS — E1140 Type 2 diabetes mellitus with diabetic neuropathy, unspecified: Secondary | ICD-10-CM | POA: Diagnosis not present

## 2018-09-28 DIAGNOSIS — E039 Hypothyroidism, unspecified: Secondary | ICD-10-CM | POA: Diagnosis not present

## 2018-09-28 DIAGNOSIS — Z79890 Hormone replacement therapy (postmenopausal): Secondary | ICD-10-CM

## 2018-09-28 DIAGNOSIS — W1839XA Other fall on same level, initial encounter: Secondary | ICD-10-CM | POA: Diagnosis present

## 2018-09-28 DIAGNOSIS — Z7902 Long term (current) use of antithrombotics/antiplatelets: Secondary | ICD-10-CM | POA: Diagnosis not present

## 2018-09-28 DIAGNOSIS — S065X9A Traumatic subdural hemorrhage with loss of consciousness of unspecified duration, initial encounter: Secondary | ICD-10-CM | POA: Diagnosis not present

## 2018-09-28 DIAGNOSIS — Z7982 Long term (current) use of aspirin: Secondary | ICD-10-CM

## 2018-09-28 DIAGNOSIS — S5011XA Contusion of right forearm, initial encounter: Secondary | ICD-10-CM | POA: Diagnosis not present

## 2018-09-28 DIAGNOSIS — R9401 Abnormal electroencephalogram [EEG]: Secondary | ICD-10-CM | POA: Diagnosis present

## 2018-09-28 DIAGNOSIS — I2510 Atherosclerotic heart disease of native coronary artery without angina pectoris: Secondary | ICD-10-CM | POA: Diagnosis not present

## 2018-09-28 DIAGNOSIS — R29898 Other symptoms and signs involving the musculoskeletal system: Secondary | ICD-10-CM

## 2018-09-28 DIAGNOSIS — E876 Hypokalemia: Secondary | ICD-10-CM | POA: Diagnosis present

## 2018-09-28 DIAGNOSIS — S065XAA Traumatic subdural hemorrhage with loss of consciousness status unknown, initial encounter: Secondary | ICD-10-CM | POA: Diagnosis present

## 2018-09-28 DIAGNOSIS — E1165 Type 2 diabetes mellitus with hyperglycemia: Secondary | ICD-10-CM

## 2018-09-28 DIAGNOSIS — Z9861 Coronary angioplasty status: Secondary | ICD-10-CM

## 2018-09-28 DIAGNOSIS — E785 Hyperlipidemia, unspecified: Secondary | ICD-10-CM | POA: Diagnosis present

## 2018-09-28 DIAGNOSIS — M8580 Other specified disorders of bone density and structure, unspecified site: Secondary | ICD-10-CM | POA: Diagnosis not present

## 2018-09-28 DIAGNOSIS — S065X0A Traumatic subdural hemorrhage without loss of consciousness, initial encounter: Secondary | ICD-10-CM | POA: Diagnosis not present

## 2018-09-28 DIAGNOSIS — I255 Ischemic cardiomyopathy: Secondary | ICD-10-CM

## 2018-09-28 DIAGNOSIS — R258 Other abnormal involuntary movements: Secondary | ICD-10-CM | POA: Diagnosis present

## 2018-09-28 HISTORY — DX: Traumatic subdural hemorrhage with loss of consciousness status unknown, initial encounter: S06.5XAA

## 2018-09-28 HISTORY — DX: Traumatic subdural hemorrhage with loss of consciousness of unspecified duration, initial encounter: S06.5X9A

## 2018-09-28 HISTORY — DX: Unspecified convulsions: R56.9

## 2018-09-28 HISTORY — DX: Other symptoms and signs involving the musculoskeletal system: R29.898

## 2018-09-28 LAB — CBC
HCT: 43.5 % (ref 39.0–52.0)
Hemoglobin: 14.5 g/dL (ref 13.0–17.0)
MCH: 30.1 pg (ref 26.0–34.0)
MCHC: 33.3 g/dL (ref 30.0–36.0)
MCV: 90.2 fL (ref 80.0–100.0)
Platelets: 187 10*3/uL (ref 150–400)
RBC: 4.82 MIL/uL (ref 4.22–5.81)
RDW: 11.9 % (ref 11.5–15.5)
WBC: 8 10*3/uL (ref 4.0–10.5)
nRBC: 0 % (ref 0.0–0.2)

## 2018-09-28 LAB — RAPID URINE DRUG SCREEN, HOSP PERFORMED
Amphetamines: NOT DETECTED
Barbiturates: NOT DETECTED
Benzodiazepines: NOT DETECTED
Cocaine: NOT DETECTED
Opiates: NOT DETECTED
Tetrahydrocannabinol: NOT DETECTED

## 2018-09-28 LAB — URINALYSIS, ROUTINE W REFLEX MICROSCOPIC
Bilirubin Urine: NEGATIVE
Glucose, UA: >=500 mg/dL — AB
Hgb urine dipstick: NEGATIVE
Ketones, ur: NEGATIVE mg/dL
Leukocytes,Ua: NEGATIVE
Nitrite: NEGATIVE
Protein, ur: NEGATIVE mg/dL
Specific Gravity, Urine: 1.01 (ref 1.005–1.030)
pH: 5.5 (ref 5.0–8.0)

## 2018-09-28 LAB — APTT: aPTT: 27 seconds (ref 24–36)

## 2018-09-28 LAB — COMPREHENSIVE METABOLIC PANEL
ALT: 27 U/L (ref 0–44)
AST: 20 U/L (ref 15–41)
Albumin: 4.2 g/dL (ref 3.5–5.0)
Alkaline Phosphatase: 86 U/L (ref 38–126)
Anion gap: 8 (ref 5–15)
BUN: 24 mg/dL — ABNORMAL HIGH (ref 8–23)
CO2: 24 mmol/L (ref 22–32)
CREATININE: 1.09 mg/dL (ref 0.61–1.24)
Calcium: 9.4 mg/dL (ref 8.9–10.3)
Chloride: 102 mmol/L (ref 98–111)
GFR calc non Af Amer: 59 mL/min — ABNORMAL LOW (ref >60)
Glucose, Bld: 530 mg/dL (ref 70–99)
Potassium: 4.3 mmol/L (ref 3.5–5.1)
Sodium: 134 mmol/L — ABNORMAL LOW (ref 135–145)
Total Bilirubin: 0.9 mg/dL (ref 0.3–1.2)
Total Protein: 7.4 g/dL (ref 6.5–8.1)

## 2018-09-28 LAB — URINALYSIS, MICROSCOPIC (REFLEX): RBC / HPF: NONE SEEN RBC/hpf (ref 0–5)

## 2018-09-28 LAB — TROPONIN I: Troponin I: <0.03 ng/mL (ref <0.03)

## 2018-09-28 LAB — PROTIME-INR
INR: 1 (ref 0.8–1.2)
Prothrombin Time: 12.9 seconds (ref 11.4–15.2)

## 2018-09-28 LAB — DIFFERENTIAL
Abs Immature Granulocytes: 0.05 10*3/uL (ref 0.00–0.07)
Basophils Absolute: 0 10*3/uL (ref 0.0–0.1)
Basophils Relative: 0 %
Eosinophils Absolute: 0 10*3/uL (ref 0.0–0.5)
Eosinophils Relative: 0 %
Immature Granulocytes: 1 %
Lymphocytes Relative: 14 %
Lymphs Abs: 1.1 10*3/uL (ref 0.7–4.0)
MONO ABS: 0.5 10*3/uL (ref 0.1–1.0)
Monocytes Relative: 6 %
NEUTROS PCT: 79 %
Neutro Abs: 6.3 10*3/uL (ref 1.7–7.7)

## 2018-09-28 LAB — TSH: TSH: 1.761 u[IU]/mL (ref 0.350–4.500)

## 2018-09-28 LAB — GLUCOSE, CAPILLARY
Glucose-Capillary: 283 mg/dL — ABNORMAL HIGH (ref 70–99)
Glucose-Capillary: 318 mg/dL — ABNORMAL HIGH (ref 70–99)

## 2018-09-28 LAB — ETHANOL: Alcohol, Ethyl (B): <10 mg/dL (ref <10)

## 2018-09-28 LAB — CBG MONITORING, ED: GLUCOSE-CAPILLARY: 421 mg/dL — AB (ref 70–99)

## 2018-09-28 MED ORDER — INSULIN ASPART 100 UNIT/ML ~~LOC~~ SOLN
0.0000 [IU] | Freq: Every day | SUBCUTANEOUS | Status: DC
  Administered 2018-09-28: 4 [IU] via SUBCUTANEOUS

## 2018-09-28 MED ORDER — ONDANSETRON HCL 4 MG/2ML IJ SOLN: 4.0000 mg | Freq: Four times a day (QID) | INTRAMUSCULAR | Status: DC | PRN

## 2018-09-28 MED ORDER — ACETAMINOPHEN 325 MG PO TABS
650.0000 mg | ORAL_TABLET | Freq: Four times a day (QID) | ORAL | Status: DC | PRN
  Administered 2018-09-29: 650 mg via ORAL
  Filled 2018-09-28: qty 2

## 2018-09-28 MED ORDER — SODIUM CHLORIDE 0.9% FLUSH: 3.0000 mL | Freq: Two times a day (BID) | INTRAVENOUS | Status: DC

## 2018-09-28 MED ORDER — ATORVASTATIN CALCIUM 80 MG PO TABS
80.0000 mg | ORAL_TABLET | Freq: Every day | ORAL | Status: DC
  Administered 2018-09-29: 80 mg via ORAL
  Filled 2018-09-28: qty 1

## 2018-09-28 MED ORDER — INSULIN ASPART 100 UNIT/ML ~~LOC~~ SOLN
0.0000 [IU] | Freq: Three times a day (TID) | SUBCUTANEOUS | Status: DC
  Administered 2018-09-28: 5 [IU] via SUBCUTANEOUS
  Administered 2018-09-29: 3 [IU] via SUBCUTANEOUS
  Administered 2018-09-29: 5 [IU] via SUBCUTANEOUS
  Administered 2018-09-29: 9 [IU] via SUBCUTANEOUS

## 2018-09-28 MED ORDER — PANTOPRAZOLE SODIUM 20 MG PO TBEC
20.0000 mg | DELAYED_RELEASE_TABLET | Freq: Every day | ORAL | Status: DC
  Administered 2018-09-29: 20 mg via ORAL
  Filled 2018-09-28: qty 1

## 2018-09-28 MED ORDER — SODIUM CHLORIDE 0.9 % IV SOLN
250.0000 mL | INTRAVENOUS | Status: DC | PRN
  Administered 2018-09-29: 250 mL via INTRAVENOUS

## 2018-09-28 MED ORDER — INSULIN ASPART PROT & ASPART (70-30 MIX) 100 UNIT/ML ~~LOC~~ SUSP
SUBCUTANEOUS | Status: AC
  Filled 2018-09-28: qty 10

## 2018-09-28 MED ORDER — HYDROCODONE-ACETAMINOPHEN 5-325 MG PO TABS: 1.0000 | ORAL_TABLET | ORAL | Status: DC | PRN

## 2018-09-28 MED ORDER — POLYETHYLENE GLYCOL 3350 17 G PO PACK: 17.0000 g | PACK | Freq: Every day | ORAL | Status: DC | PRN

## 2018-09-28 MED ORDER — LACTATED RINGERS IV SOLN
INTRAVENOUS | Status: DC
  Administered 2018-09-28: 16:00:00 via INTRAVENOUS

## 2018-09-28 MED ORDER — LEVOTHYROXINE SODIUM 50 MCG PO TABS
50.0000 ug | ORAL_TABLET | Freq: Every day | ORAL | Status: DC
  Administered 2018-09-29: 50 ug via ORAL
  Filled 2018-09-28: qty 1

## 2018-09-28 MED ORDER — LEVETIRACETAM 500 MG PO TABS
500.0000 mg | ORAL_TABLET | Freq: Two times a day (BID) | ORAL | Status: DC
  Administered 2018-09-28 – 2018-09-29 (×2): 500 mg via ORAL
  Filled 2018-09-28 (×2): qty 1

## 2018-09-28 MED ORDER — INSULIN ASPART PROT & ASPART (70-30 MIX) 100 UNIT/ML ~~LOC~~ SUSP
10.0000 [IU] | Freq: Once | SUBCUTANEOUS | Status: AC
  Administered 2018-09-28: 10 [IU] via SUBCUTANEOUS

## 2018-09-28 MED ORDER — ISOSORBIDE MONONITRATE ER 30 MG PO TB24: 30.0000 mg | ORAL_TABLET | Freq: Every day | ORAL | Status: DC

## 2018-09-28 MED ORDER — BISACODYL 5 MG PO TBEC: 5.0000 mg | DELAYED_RELEASE_TABLET | Freq: Every day | ORAL | Status: DC | PRN

## 2018-09-28 MED ORDER — ACETAMINOPHEN 650 MG RE SUPP: 650.0000 mg | Freq: Four times a day (QID) | RECTAL | Status: DC | PRN

## 2018-09-28 MED ORDER — SODIUM CHLORIDE 0.9% FLUSH: 3.0000 mL | INTRAVENOUS | Status: DC | PRN

## 2018-09-28 MED ORDER — ONDANSETRON HCL 4 MG PO TABS: 4.0000 mg | ORAL_TABLET | Freq: Four times a day (QID) | ORAL | Status: DC | PRN

## 2018-09-28 NOTE — H&P (Signed)
History and Physical    EMERIL STILLE HFW:[id number] DOB: 1925-03-26 DOA: 09/28/2018  PCP: [name], DO   Patient coming from: Home    Chief Complaint: Uncontrolled right arm movements, fall with head injury   HPI: Evan Wright is a 85 y.o. male with medical history significant for coronary artery disease, ischemic cardiomyopathy, hypothyroidism, diabetes mellitus, and neuropathy, now presenting to the emergency department with several days of uncontrolled right arm movements, transient right arm numbness and weakness, and gait difficulty with fall today.  Patient is accompanied by his family who assist with the history.  He was noted to have uncontrolled, rhythmic, jerking and writhing of the right upper extremity on 09/24/2018.  He went on to have several similar episodes the following day, and has continued to have these episodes.  They last just a minute or 2 and are followed by numbness and weakness involving the right arm.  The patient maintains awareness during the episodes.  He denies any change in vision or hearing and denies any focal numbness or weakness at this time.  Reports that he fell today due to losing his balance, noting that his bilateral lower extremities are often numb and weak, but this is a chronic issue for him.  He denies any recent fevers, chills, chest pain, cough, or shortness of breath.  ED Course: Upon arrival to the ED, patient is found to be afebrile, saturating well on room air, and with vitals otherwise stable.  EKG features a sinus rhythm and chest x-ray negative for acute cardiopulmonary disease.  Radiographs of the right elbow are negative for acute fracture or dislocation.  Noncontrast head CT is notable for small left subdural hematoma without mass-effect or midline shift, as well as a right frontal scalp hematoma without underlying fracture.  Chemistry panel is notable for a glucose of 530 with normal bicarbonate and normal anion gap.  CBC is  unremarkable, troponin undetectable, and urinalysis notable for glucosuria.  Neurosurgery was consulted by the ED physician and recommended stopping the antiplatelets and following clinically.  Patient was given 10 units of subcutaneous insulin and started on IV fluids.  He remains hemodynamically stable and has been transferred to [facility] for ongoing evaluation and management.  Review of Systems:  All other systems reviewed and apart from HPI, are negative.  Past Medical History:  Diagnosis Date  . Adenomatous colon polyp   . Angina pectoris ([place]) 04/2017  . CAD (coronary artery disease)    a. Cath 04/29/17-> high grade pLAD with R to L collaterals, high grade 1st dig  2. Cath 05/01/17 s/p ostial to mLAD with DES x 2.   . Cardiac murmur   . Coronary artery disease   . Diverticulosis   . GERD (gastroesophageal reflux disease)   . History of hiatal hernia   . Hyperlipidemia   . Loss of height 06/11/2016  . Osteoarthritis   . Prostate cancer [facility])     Past Surgical History:  Procedure Laterality Date  . APPENDECTOMY  1948  . COLONOSCOPY    . CORONARY STENT INTERVENTION N/A 05/01/2017   Procedure: CORONARY STENT INTERVENTION;  Surgeon: [name], [name], MD;  Location: [place] [facility];  Service: Cardiovascular;  Laterality: N/A;  . EYE SURGERY  2012   march and April  --Dr [name]  . LEFT HEART CATH AND CORONARY ANGIOGRAPHY N/A 04/29/2017   Procedure: LEFT HEART CATH AND CORONARY ANGIOGRAPHY;  Surgeon: [name], MD;  Location: [place]  [facility];  Service: Cardiovascular;  Laterality: N/A;  . PROSTATECTOMY    . TONSILLECTOMY       reports that he has never smoked. He has never used smokeless tobacco. He reports that he does not drink alcohol or use drugs.  Allergies  Allergen Reactions  . Penicillins Other (See Comments)    unknown    Family History  Problem Relation Age of Onset  . Colon cancer Brother   . Lung cancer Father   . Prostate cancer Father   .  Colitis Mother      Prior to Admission medications   Medication Sig Start Date End Date Taking? Authorizing Provider  aspirin EC 81 MG tablet Take 1 tablet (81 mg total) by mouth daily. 04/02/17   Revankar, Reita Cliche, MD  atorvastatin (LIPITOR) 80 MG tablet Take 1 tablet (80 mg total) by mouth daily. 06/11/18 09/09/18  Revankar, Reita Cliche, MD  Calcium-Phosphorus-Vitamin D (CITRACAL +D3 PO) Take 1 tablet by mouth daily with lunch.    [provider]  carvedilol (COREG) 3.125 MG tablet TAKE ONE TABLET BY MOUTH TWICE DAILY 09/09/18   [name], DO  cholecalciferol (VITAMIN D) 400 units TABS tablet Take 400 Units by mouth daily with lunch.    [provider]  clopidogrel (PLAVIX) 75 MG tablet TAKE ONE TABLET BY MOUTH EVERY DAY 07/23/18   Revankar, Reita Cliche, MD  isosorbide mononitrate (IMDUR) 30 MG 24 hr tablet Take 1 tablet (30 mg total) by mouth daily. 09/22/18 12/21/18  Revankar, Reita Cliche, MD  lansoprazole (PREVACID) 30 MG capsule TAKE ONE CAPSULE BY MOUTH EVERY DAY AT 12 NOON WITH FOOD 08/03/18   Carollee Herter, Alferd Apa, DO  levothyroxine (SYNTHROID, LEVOTHROID) 50 MCG tablet TAKE ONE TABLET BY MOUTH DAILY 09/11/18   [name], DO  Multiple Vitamins-Minerals (ICAPS AREDS 2 PO) Take 1 tablet by mouth daily with lunch.    [provider]  nitroGLYCERIN (NITROSTAT) 0.4 MG SL tablet Place 1 tablet (0.4 mg total) under the tongue every 5 (five) minutes as needed for chest pain. 04/02/17 02/17/19  Revankar, Reita Cliche, MD  Psyllium (METAMUCIL PO) Take 1 Dose by mouth daily.     [provider]    Physical Exam: Vitals:   09/28/18 1600 09/28/18 1630 09/28/18 1730 09/28/18 1906  BP: 133/60 (!) 127/55 (!) 144/67 (!) 173/70  Pulse:  65 64 63  Resp: 20 18 15 16   Temp:    98 F (36.7 C)  TempSrc:    Oral  SpO2: 98% 99% 99% 96%  Weight:      Height:        Constitutional: NAD, calm  Eyes: PERTLA, lids and conjunctivae normal ENMT: Mucous membranes are moist.  Posterior pharynx clear of any exudate or lesions.   Neck: normal, supple, no masses, no thyromegaly Respiratory: clear to auscultation bilaterally, no wheezing, no crackles. Normal respiratory effort.  Cardiovascular: S1 & S2 heard, regular rate and rhythm. No extremity edema.   Abdomen: No distension, no tenderness, no masses palpated. Bowel sounds .  Musculoskeletal: no clubbing / cyanosis. No joint deformity upper and lower extremities.   Skin: Ecchymoses involving RUE and right forehead. Warm, dry, well-perfused. Neurologic: CN 2-12 grossly intac, mild dysarthria. Sensation to light touch diminished in LE's bilaterally. Strength 5/5 in all 4 limbs.  Psychiatric: Alert and oriented to person, place, and situation. Calm, cooperative.    Labs on Admission: I have personally reviewed following labs and imaging studies  CBC: Recent Labs  Lab 09/22/18 1050 09/28/18 1136  WBC 5.5 8.0  NEUTROABS  --  6.3  HGB 14.7 14.5  HCT 43.9 43.5  MCV 92 90.2  PLT 221 250   Basic Metabolic Panel: Recent Labs  Lab 09/22/18 1050 09/28/18 1136  NA 140 134*  K 4.6 4.3  CL 101 102  CO2 21 24  GLUCOSE 364* 530*  BUN 16 24*  CREATININE 1.19 1.09  CALCIUM 10.1 9.4   GFR: Estimated Creatinine Clearance: 45.2 mL/min (by C-G formula based on SCr of 1.09 mg/dL). Liver Function Tests: Recent Labs  Lab 09/22/18 1050 09/28/18 1136  AST 17 20  ALT 27 27  ALKPHOS 91 86  BILITOT 0.7 0.9  PROT 7.1 7.4  ALBUMIN 4.7* 4.2   No results for input(s): LIPASE, AMYLASE in the last 168 hours. No results for input(s): AMMONIA in the last 168 hours. Coagulation Profile: Recent Labs  Lab 09/28/18 1136  INR 1.0   Cardiac Enzymes: Recent Labs  Lab 09/28/18 1136  TROPONINI <0.03   BNP (last 3 results) No results for input(s): PROBNP in the last 8760 hours. HbA1C: No results for input(s): HGBA1C in the last 72 hours. CBG: Recent Labs  Lab 09/28/18 1400  GLUCAP 421*   Lipid Profile: No  results for input(s): CHOL, HDL, LDLCALC, TRIG, CHOLHDL, LDLDIRECT in the last 72 hours. Thyroid Function Tests: No results for input(s): TSH, T4TOTAL, FREET4, T3FREE, THYROIDAB in the last 72 hours. Anemia Panel: No results for input(s): VITAMINB12, FOLATE, FERRITIN, TIBC, IRON, RETICCTPCT in the last 72 hours. Urine analysis:    Component Value Date/Time   COLORURINE YELLOW 09/28/2018 1136   APPEARANCEUR CLEAR 09/28/2018 1136   LABSPEC 1.010 09/28/2018 1136   PHURINE 5.5 09/28/2018 1136   GLUCOSEU >=500 (A) 09/28/2018 1136   HGBUR NEGATIVE 09/28/2018 1136   HGBUR negative 07/12/2010 0941   BILIRUBINUR NEGATIVE 09/28/2018 1136   BILIRUBINUR neg 06/11/2016 1149   KETONESUR NEGATIVE 09/28/2018 1136   PROTEINUR NEGATIVE 09/28/2018 1136   UROBILINOGEN 0.2 06/11/2016 1149   UROBILINOGEN negative 07/12/2010 0941   NITRITE NEGATIVE 09/28/2018 1136   LEUKOCYTESUR NEGATIVE 09/28/2018 1136   Sepsis Labs: @LABRCNTIP (procalcitonin:4,lacticidven:4) )No results found for this or any previous visit (from the past 240 hour(s)).   Radiological Exams on Admission: Dg Chest 2 View  Result Date: 09/28/2018 CLINICAL DATA:  Status post fall. EXAM: CHEST - 2 VIEW COMPARISON:  02/28/2018 FINDINGS: Cardiomediastinal silhouette is normal. Mediastinal contours appear intact. Calcific atherosclerotic disease of the aorta. There is no evidence of focal airspace consolidation, pleural effusion or pneumothorax. Osseous structures are without acute abnormality. Soft tissues are grossly normal. IMPRESSION: No active cardiopulmonary disease. Electronically Signed   By: Fidela Salisbury M.D.   On: 09/28/2018 12:01   Dg Elbow Complete Right  Result Date: 09/28/2018 CLINICAL DATA:  Status post fall with right elbow pain. EXAM: RIGHT ELBOW - COMPLETE 3+ VIEW COMPARISON:  None. FINDINGS: There is no evidence of fracture, dislocation, or joint effusion. Soft tissue swelling/hematoma anterior to the radial head.  IMPRESSION: No acute fracture or dislocation identified about the right elbow. Soft tissue swelling/hematoma anterior to the radial head. Electronically Signed   By: Fidela Salisbury M.D.   On: 09/28/2018 12:04   Ct Head Wo Contrast  Result Date: 09/28/2018 CLINICAL DATA:  Fall and head trauma.  Arm weakness. EXAM: CT HEAD WITHOUT CONTRAST CT CERVICAL SPINE WITHOUT CONTRAST TECHNIQUE: Multidetector CT imaging of the head and cervical spine was performed following the  standard protocol without intravenous contrast. Multiplanar CT image reconstructions of the cervical spine were also generated. COMPARISON:  None. FINDINGS: CT HEAD FINDINGS Brain: Small amount of extra-axial blood along the left convexity near the vertex. Findings are suggestive for a small subdural hematoma. Blood is located in the left anterior parietal region. No other areas are concerning for intracranial hemorrhage. No evidence for large infarct, mass lesion, midline shift or hydrocephalus. Mild cerebral atrophy. Small amount of low-density in the periventricular white matter. Vascular: No hyperdense vessel or unexpected calcification. Skull: Right frontal scalp hematoma without underlying fracture. No acute bone abnormality. Sinuses/Orbits: Visualized paranasal sinuses are clear. Other: None CT CERVICAL SPINE FINDINGS Alignment: Curvature of the lower cervical spine towards the right side. Skull base and vertebrae: No acute fracture. No primary bone lesion or focal pathologic process. Soft tissues and spinal canal: No prevertebral fluid or swelling. No visible canal hematoma. Disc levels: Multilevel disc space narrowing and endplate disease. Prominent facet disease on the left side at C3-C4. Upper chest: Lung apices are clear.  Negative for pneumothorax. Other: None IMPRESSION: 1. Small left subdural hematoma. No evidence for mass effect or midline shift. 2. Right frontal scalp hematoma.  No calvarial fracture. 3. Mild cerebral atrophy.  Evidence for chronic small vessel ischemic changes. 4. Degenerative changes in the cervical spine without acute bone abnormality. Critical Value/emergent results were called by telephone at the time of interpretation on 09/28/2018 at 12:05 pm to Dr. Nanda Quinton , who verbally acknowledged these results. Electronically Signed   By: Markus Daft M.D.   On: 09/28/2018 12:08   Ct Cervical Spine Wo Contrast  Result Date: 09/28/2018 CLINICAL DATA:  Fall and head trauma.  Arm weakness. EXAM: CT HEAD WITHOUT CONTRAST CT CERVICAL SPINE WITHOUT CONTRAST TECHNIQUE: Multidetector CT imaging of the head and cervical spine was performed following the standard protocol without intravenous contrast. Multiplanar CT image reconstructions of the cervical spine were also generated. COMPARISON:  None. FINDINGS: CT HEAD FINDINGS Brain: Small amount of extra-axial blood along the left convexity near the vertex. Findings are suggestive for a small subdural hematoma. Blood is located in the left anterior parietal region. No other areas are concerning for intracranial hemorrhage. No evidence for large infarct, mass lesion, midline shift or hydrocephalus. Mild cerebral atrophy. Small amount of low-density in the periventricular white matter. Vascular: No hyperdense vessel or unexpected calcification. Skull: Right frontal scalp hematoma without underlying fracture. No acute bone abnormality. Sinuses/Orbits: Visualized paranasal sinuses are clear. Other: None CT CERVICAL SPINE FINDINGS Alignment: Curvature of the lower cervical spine towards the right side. Skull base and vertebrae: No acute fracture. No primary bone lesion or focal pathologic process. Soft tissues and spinal canal: No prevertebral fluid or swelling. No visible canal hematoma. Disc levels: Multilevel disc space narrowing and endplate disease. Prominent facet disease on the left side at C3-C4. Upper chest: Lung apices are clear.  Negative for pneumothorax. Other: None IMPRESSION:  1. Small left subdural hematoma. No evidence for mass effect or midline shift. 2. Right frontal scalp hematoma.  No calvarial fracture. 3. Mild cerebral atrophy. Evidence for chronic small vessel ischemic changes. 4. Degenerative changes in the cervical spine without acute bone abnormality. Critical Value/emergent results were called by telephone at the time of interpretation on 09/28/2018 at 12:05 pm to Dr. Nanda Quinton , who verbally acknowledged these results. Electronically Signed   By: Markus Daft M.D.   On: 09/28/2018 12:08    EKG: Independently reviewed. Sinus rhythm.   Assessment/Plan  1. Uncontrolled right arm movements  - Presents with 5 days of recurrent episodes involving uncontrolled movements of right arm  - Head CT with small left SDH  - Discussed with neuro, appreciate recommendations  - There is concern for seizures, likely from SDH and/or uncontrolled glucose  - Plan for EEG, glycemic-control, start Keppra 500 BID for at least 7 days, continue outpatient neurology follow-up    2. Subdural hematoma  - Presents after a fall with right forehead hematoma, found to have small left SDH without mass-effect or midline shift  - NSG recommends holding antiplatelets and following clinically   3. Uncontrolled diabetes  - A1c was 10.9% on 09/22/18  - Patient has been attempting diet-control and has not been on any medications for this  - Not in DKA on admission  - Treated with 10 units sq insulin in ED  - Check CBG's and use Novolog per sliding-scale    4. CAD; ischemic cardiomyopathy  - No anginal complaints  - Hold ASA and Plavix in light of SDH, continue statin    5. Hypothyroidism  - TSH was normal on 09/22/18   - Continue Synthroid     DVT prophylaxis: SCD's Code Status: Full   Family Communication: Wife and daughters updated at bedside  Consults called: Discussed with neurology Admission status: Inpatient     Vianne Bulls, MD [facility] Pager [phone]  If  7PM-7AM, please contact night-coverage [email or web address] Password [email or web address]  09/28/2018, 8:10 PM

## 2018-09-28 NOTE — Telephone Encounter (Signed)
Received request for signature for Verbal Add-on Order, completed information RE: lab test; forwarded to provider/SLS 03/09

## 2018-09-28 NOTE — ED Triage Notes (Signed)
Pt reports right arm weakness x 3 days , weakness today to left arm. Hx neuropathy and HTN, took his [place]  prior to arrival . Also reports fall today , presents with right forehead hematoma. denies dizziness, fall due to imbalance. Alert and oriented x 4, no neuro deficit in triage,pt in wheelchair. On Plavix.

## 2018-09-28 NOTE — ED Notes (Signed)
[facility] arrived to transfer pt to [facility]

## 2018-09-28 NOTE — ED Provider Notes (Signed)
Emergency Department Provider Note   I have reviewed the triage vital signs and the nursing notes.   HISTORY  Chief Complaint Fall (on Plavix)   HPI DAE HIGHLEY is a 85 y.o. male with PMH of CAD, GERD, and HLD presents to the emergency department for evaluation of gait instability and fall.  Family state that since Thursday the patient has experienced occasional, rhythmic, uncontrolled movement of the right upper extremity.  This is a new problem for the patient.  They deny tremor or erratic movement.  They state that it comes on at intermittent intervals.  Patient states that he cannot control the movements.  Family describe them as flowing and almost rhythmic.  Patient has had several episodes in the past few days.  He feels subjective numbness and weakness in the arm mostly after these episodes.  Denies headache.  He had an episode this morning when trying to lift up and walk using his walker.  He felt like his legs "gave out" and fell to the ground hitting his head.  He has been compliant with his medications including Plavix.  He denies any lasting chest pain, palpitations, shortness of breath.  No new medications.  No history of seizure.  Patient does have baseline neuropathy in his bilateral lower extremities.  Past Medical History:  Diagnosis Date  . Adenomatous colon polyp   . Angina pectoris ([place]) 04/2017  . CAD (coronary artery disease)    a. Cath 04/29/17-> high grade pLAD with R to L collaterals, high grade 1st dig  2. Cath 05/01/17 s/p ostial to mLAD with DES x 2.   . Cardiac murmur   . Coronary artery disease   . Diverticulosis   . GERD (gastroesophageal reflux disease)   . History of hiatal hernia   . Hyperlipidemia   . Loss of height 06/11/2016  . Osteoarthritis   . Prostate cancer [facility])     Patient Active Problem List   Diagnosis Date Noted  . SDH (subdural hematoma) ([place]) 09/28/2018  . Preventative health care 09/23/2018  . Diet-controlled diabetes mellitus  ([place]) 09/23/2018  . Osteopenia 09/25/2017  . Essential hypertension 09/25/2017  . Hyperglycemia 09/25/2017  . Hypothyroidism 09/25/2017  . CAD (coronary artery disease), native coronary artery 04/29/2017  . Nonsustained ventricular tachycardia ([place]) 04/24/2017  . Ischemic cardiomyopathy 04/10/2017  . Mixed dyslipidemia 04/10/2017  . Abnormal EKG 04/02/2017  . Idiopathic peripheral neuropathy 10/11/2016  . Proximal leg weakness 10/11/2016  . Weakness of both lower extremities 06/11/2016  . De Quervain's tenosynovitis 05/18/2012  . Family history of malignant neoplasm of gastrointestinal tract 05/14/2011  . NEVI, MULTIPLE 07/12/2010  . OSTEOARTHRITIS 07/12/2010  . UNSPECIFIED VITAMIN D DEFICIENCY 02/11/2008  . HIATAL HERNIA WITH REFLUX 02/11/2008  . GASTROENTERITIS 01/20/2008  . PERSONAL HISTORY OF COLONIC POLYPS 11/17/2007  . DIZZINESS 10/15/2007  . PROSTATE CANCER, HX OF 10/15/2007    Past Surgical History:  Procedure Laterality Date  . APPENDECTOMY  1948  . COLONOSCOPY    . CORONARY STENT INTERVENTION N/A 05/01/2017   Procedure: CORONARY STENT INTERVENTION;  Surgeon: [name], [name], MD;  Location: [place] [facility];  Service: Cardiovascular;  Laterality: N/A;  . EYE SURGERY  2012   march and April  --Dr [name]  . LEFT HEART CATH AND CORONARY ANGIOGRAPHY N/A 04/29/2017   Procedure: LEFT HEART CATH AND CORONARY ANGIOGRAPHY;  Surgeon: [name], MD;  Location: [place] [facility];  Service: Cardiovascular;  Laterality: N/A;  . PROSTATECTOMY    .  TONSILLECTOMY     Allergies Penicillins  Family History  Problem Relation Age of Onset  . Colon cancer Brother   . Lung cancer Father   . Prostate cancer Father   . Colitis Mother     Social History Social History   Tobacco Use  . Smoking status: Never Smoker  . Smokeless tobacco: Never Used  Substance Use Topics  . Alcohol use: No  . Drug use: No    Review of Systems  Constitutional: No fever/chills Eyes: No  visual changes. ENT: No sore throat. Cardiovascular: Denies chest pain. Respiratory: Denies shortness of breath. Gastrointestinal: No abdominal pain.  No nausea, no vomiting.  No diarrhea.  No constipation. Genitourinary: Negative for dysuria. Musculoskeletal: Negative for back pain. Right arm pain after fall.  Skin: Negative for rash. Neurological: Negative for focal weakness or numbness. Positive mild HA after fall. Intermittent abnormal arm movements.   10-point ROS otherwise negative.  ____________________________________________   PHYSICAL EXAM:  VITAL SIGNS: ED Triage Vitals  Enc Vitals Group     BP 09/28/18 1034 (!) 182/67     Pulse Rate 09/28/18 1034 62     Resp --      Temp 09/28/18 1034 98.1 F (36.7 C)     Temp Source 09/28/18 1034 Oral     SpO2 09/28/18 1034 100 %     Weight 09/28/18 1035 163 lb (73.9 kg)     Height 09/28/18 1035 5\' 11"  (1.803 m)     Pain Score 09/28/18 1039 0   Constitutional: Alert and oriented. Well appearing and in no acute distress. Eyes: Conjunctivae are normal. PERRL. EOMI. Head: Hematoma over the right forehead.  Nose: No congestion/rhinnorhea. Mouth/Throat: Mucous membranes are moist.  Neck: No stridor. No cervical spine tenderness to palpation. Cardiovascular: Normal rate, regular rhythm. Good peripheral circulation. Grossly normal heart sounds.   Respiratory: Normal respiratory effort.  No retractions. Lungs CTAB. Gastrointestinal: Soft and nontender. No distention.  Musculoskeletal: No lower extremity tenderness nor edema. No gross deformities of extremities. Mild tenderness over the right elbow. Normal ROM of the shoulder, wrist, and elbow. No scaphoid tenderness.  Neurologic:  Normal speech and language. No gross focal neurologic deficits are appreciated. Slight pronator drift on the right. Normal sensation. Normal grip strength.  Skin:  Skin is warm, dry and intact. No rash noted. Mild bruising over the right forearm. No  lacerations or abrasions.   ____________________________________________   LABS (all labs ordered are listed, but only abnormal results are displayed)  Labs Reviewed  COMPREHENSIVE METABOLIC PANEL - Abnormal; Notable for the following components:      Result Value   Sodium 134 (*)    Glucose, Bld 530 (*)    BUN 24 (*)    GFR calc non Af Amer 59 (*)    All other components within normal limits  URINALYSIS, ROUTINE W REFLEX MICROSCOPIC - Abnormal; Notable for the following components:   Glucose, UA >=500 (*)    All other components within normal limits  URINALYSIS, MICROSCOPIC (REFLEX) - Abnormal; Notable for the following components:   Bacteria, UA RARE (*)    All other components within normal limits  CBG MONITORING, ED - Abnormal; Notable for the following components:   Glucose-Capillary 421 (*)    All other components within normal limits  ETHANOL  PROTIME-INR  APTT  CBC  DIFFERENTIAL  RAPID URINE DRUG SCREEN, HOSP PERFORMED  TROPONIN I  TSH   ____________________________________________  EKG   EKG Interpretation  Date/Time:  Monday September 28 2018 12:00:19 EDT Ventricular Rate:  58 PR Interval:    QRS Duration: 99 QT Interval:  453 QTC Calculation: 445 R Axis:   18 Text Interpretation:  Sinus rhythm Baseline wander in lead(s) V5 No STEMI.  Confirmed by Nanda Quinton [phone]) on 09/28/2018 12:07:59 PM       ____________________________________________  RADIOLOGY  Dg Chest 2 View  Result Date: 09/28/2018 CLINICAL DATA:  Status post fall. EXAM: CHEST - 2 VIEW COMPARISON:  02/28/2018 FINDINGS: Cardiomediastinal silhouette is normal. Mediastinal contours appear intact. Calcific atherosclerotic disease of the aorta. There is no evidence of focal airspace consolidation, pleural effusion or pneumothorax. Osseous structures are without acute abnormality. Soft tissues are grossly normal. IMPRESSION: No active cardiopulmonary disease. Electronically Signed   By: Fidela Salisbury M.D.   On: 09/28/2018 12:01   Dg Elbow Complete Right  Result Date: 09/28/2018 CLINICAL DATA:  Status post fall with right elbow pain. EXAM: RIGHT ELBOW - COMPLETE 3+ VIEW COMPARISON:  None. FINDINGS: There is no evidence of fracture, dislocation, or joint effusion. Soft tissue swelling/hematoma anterior to the radial head. IMPRESSION: No acute fracture or dislocation identified about the right elbow. Soft tissue swelling/hematoma anterior to the radial head. Electronically Signed   By: Fidela Salisbury M.D.   On: 09/28/2018 12:04   Ct Head Wo Contrast  Result Date: 09/28/2018 CLINICAL DATA:  Fall and head trauma.  Arm weakness. EXAM: CT HEAD WITHOUT CONTRAST CT CERVICAL SPINE WITHOUT CONTRAST TECHNIQUE: Multidetector CT imaging of the head and cervical spine was performed following the standard protocol without intravenous contrast. Multiplanar CT image reconstructions of the cervical spine were also generated. COMPARISON:  None. FINDINGS: CT HEAD FINDINGS Brain: Small amount of extra-axial blood along the left convexity near the vertex. Findings are suggestive for a small subdural hematoma. Blood is located in the left anterior parietal region. No other areas are concerning for intracranial hemorrhage. No evidence for large infarct, mass lesion, midline shift or hydrocephalus. Mild cerebral atrophy. Small amount of low-density in the periventricular white matter. Vascular: No hyperdense vessel or unexpected calcification. Skull: Right frontal scalp hematoma without underlying fracture. No acute bone abnormality. Sinuses/Orbits: Visualized paranasal sinuses are clear. Other: None CT CERVICAL SPINE FINDINGS Alignment: Curvature of the lower cervical spine towards the right side. Skull base and vertebrae: No acute fracture. No primary bone lesion or focal pathologic process. Soft tissues and spinal canal: No prevertebral fluid or swelling. No visible canal hematoma. Disc levels: Multilevel disc  space narrowing and endplate disease. Prominent facet disease on the left side at C3-C4. Upper chest: Lung apices are clear.  Negative for pneumothorax. Other: None IMPRESSION: 1. Small left subdural hematoma. No evidence for mass effect or midline shift. 2. Right frontal scalp hematoma.  No calvarial fracture. 3. Mild cerebral atrophy. Evidence for chronic small vessel ischemic changes. 4. Degenerative changes in the cervical spine without acute bone abnormality. Critical Value/emergent results were called by telephone at the time of interpretation on 09/28/2018 at 12:05 pm to Dr. Nanda Quinton , who verbally acknowledged these results. Electronically Signed   By: Markus Daft M.D.   On: 09/28/2018 12:08   Ct Cervical Spine Wo Contrast  Result Date: 09/28/2018 CLINICAL DATA:  Fall and head trauma.  Arm weakness. EXAM: CT HEAD WITHOUT CONTRAST CT CERVICAL SPINE WITHOUT CONTRAST TECHNIQUE: Multidetector CT imaging of the head and cervical spine was performed following the standard protocol without intravenous contrast. Multiplanar CT image reconstructions of the cervical spine were also  generated. COMPARISON:  None. FINDINGS: CT HEAD FINDINGS Brain: Small amount of extra-axial blood along the left convexity near the vertex. Findings are suggestive for a small subdural hematoma. Blood is located in the left anterior parietal region. No other areas are concerning for intracranial hemorrhage. No evidence for large infarct, mass lesion, midline shift or hydrocephalus. Mild cerebral atrophy. Small amount of low-density in the periventricular white matter. Vascular: No hyperdense vessel or unexpected calcification. Skull: Right frontal scalp hematoma without underlying fracture. No acute bone abnormality. Sinuses/Orbits: Visualized paranasal sinuses are clear. Other: None CT CERVICAL SPINE FINDINGS Alignment: Curvature of the lower cervical spine towards the right side. Skull base and vertebrae: No acute fracture. No primary  bone lesion or focal pathologic process. Soft tissues and spinal canal: No prevertebral fluid or swelling. No visible canal hematoma. Disc levels: Multilevel disc space narrowing and endplate disease. Prominent facet disease on the left side at C3-C4. Upper chest: Lung apices are clear.  Negative for pneumothorax. Other: None IMPRESSION: 1. Small left subdural hematoma. No evidence for mass effect or midline shift. 2. Right frontal scalp hematoma.  No calvarial fracture. 3. Mild cerebral atrophy. Evidence for chronic small vessel ischemic changes. 4. Degenerative changes in the cervical spine without acute bone abnormality. Critical Value/emergent results were called by telephone at the time of interpretation on 09/28/2018 at 12:05 pm to Dr. Nanda Quinton , who verbally acknowledged these results. Electronically Signed   By: Markus Daft M.D.   On: 09/28/2018 12:08    ____________________________________________   PROCEDURES  Procedure(s) performed:   Procedures  CRITICAL CARE Performed by: Margette Fast Total critical care time: 35 minutes Critical care time was exclusive of separately billable procedures and treating other patients. Critical care was necessary to treat or prevent imminent or life-threatening deterioration. Critical care was time spent personally by me on the following activities: development of treatment plan with patient and/or surrogate as well as nursing, discussions with consultants, evaluation of patient's response to treatment, examination of patient, obtaining history from patient or surrogate, ordering and performing treatments and interventions, ordering and review of laboratory studies, ordering and review of radiographic studies, pulse oximetry and re-evaluation of patient's condition.  Nanda Quinton, MD Emergency Medicine  ____________________________________________   INITIAL IMPRESSION / ASSESSMENT AND PLAN / ED COURSE  Pertinent labs & imaging results that were  available during my care of the patient were reviewed by me and considered in my medical decision making (see chart for details).  She presents to the emergency department with abnormal movement of the right upper extremity.  Unclear if this represents a choreiform type movement versus partial seizure versus tremor.  Family description seems less like a tremor or spasm.  Patient does have some slight drift in the right upper extremity but normal sensation.  He is otherwise neuro intact.  I have asked family to videotape the movements if they began again.  Plan for CT imaging along with plain film of the left elbow which has some bruising and chest.  Sending screening labs for stroke.  Patient will likely require further evaluation and admission.  CT scan shows a trace subdural hematoma. No midline shift. Spoke with Dr. Saintclair Halsted who recommends following clinically and stopping anticoagulation.  No repeat CT advised.   Patient also with hyperglycemia.  No DKA.  Patient does not on medication for diabetes.  I provided subcutaneous insulin and will continue to follow blood sugars.  Viewed with no acute findings.  No additional abnormal arm  movements while in the emergency department.  Discussed patient's case with Hospitalist to request admission. Patient and family (if present) updated with plan. Care transferred to Hospitalist service.  I reviewed all nursing notes, vitals, pertinent old records, EKGs, labs, imaging (as available).  Have ordered Q2H neuro checks. Patient accepted to SDU.  ____________________________________________  FINAL CLINICAL IMPRESSION(S) / ED DIAGNOSES  Final diagnoses:  SDH (subdural hematoma) ([facility])  Hyperglycemia     MEDICATIONS GIVEN DURING THIS VISIT:  Medications  insulin aspart protamine- aspart (NOVOLOG MIX 70/30) injection 10 Units (10 Units Subcutaneous Given 09/28/18 1311)     Note:  This document was prepared using Dragon voice recognition software and may  include unintentional dictation errors.  Nanda Quinton, MD Emergency Medicine    Elenore Wanninger, [facility], MD 09/28/18 [phone]

## 2018-09-29 ENCOUNTER — Inpatient Hospital Stay (HOSPITAL_COMMUNITY): Payer: Medicare Other

## 2018-09-29 DIAGNOSIS — R569 Unspecified convulsions: Secondary | ICD-10-CM

## 2018-09-29 DIAGNOSIS — R258 Other abnormal involuntary movements: Secondary | ICD-10-CM

## 2018-09-29 LAB — CBC
HCT: 38.7 % — ABNORMAL LOW (ref 39.0–52.0)
Hemoglobin: 13.2 g/dL (ref 13.0–17.0)
MCH: 30.6 pg (ref 26.0–34.0)
MCHC: 34.1 g/dL (ref 30.0–36.0)
MCV: 89.8 fL (ref 80.0–100.0)
Platelets: 182 10*3/uL (ref 150–400)
RBC: 4.31 MIL/uL (ref 4.22–5.81)
RDW: 11.9 % (ref 11.5–15.5)
WBC: 6.7 10*3/uL (ref 4.0–10.5)
nRBC: 0 % (ref 0.0–0.2)

## 2018-09-29 LAB — BASIC METABOLIC PANEL
ANION GAP: 6 (ref 5–15)
BUN: 19 mg/dL (ref 8–23)
CO2: 25 mmol/L (ref 22–32)
Calcium: 9.2 mg/dL (ref 8.9–10.3)
Chloride: 110 mmol/L (ref 98–111)
Creatinine, Ser: 0.94 mg/dL (ref 0.61–1.24)
GFR calc Af Amer: >60 mL/min (ref >60)
GLUCOSE: 198 mg/dL — AB (ref 70–99)
Potassium: 3.3 mmol/L — ABNORMAL LOW (ref 3.5–5.1)
Sodium: 141 mmol/L (ref 135–145)

## 2018-09-29 LAB — GLUCOSE, CAPILLARY
Glucose-Capillary: 231 mg/dL — ABNORMAL HIGH (ref 70–99)
Glucose-Capillary: 376 mg/dL — ABNORMAL HIGH (ref 70–99)
Glucose-Capillary: 272 mg/dL — ABNORMAL HIGH (ref 70–99)

## 2018-09-29 LAB — MAGNESIUM: Magnesium: 1.8 mg/dL (ref 1.7–2.4)

## 2018-09-29 MED ORDER — LINAGLIPTIN 5 MG PO TABS: 5.0000 mg | ORAL_TABLET | Freq: Every day | ORAL | 0 refills | Status: DC

## 2018-09-29 MED ORDER — POTASSIUM CHLORIDE CRYS ER 20 MEQ PO TBCR
40.0000 meq | EXTENDED_RELEASE_TABLET | Freq: Once | ORAL | Status: AC
  Administered 2018-09-29: 40 meq via ORAL
  Filled 2018-09-29: qty 2

## 2018-09-29 MED ORDER — BLOOD GLUCOSE MONITOR KIT: PACK | 0 refills | Status: DC

## 2018-09-29 MED ORDER — ASPIRIN EC 81 MG PO TBEC: 81.0000 mg | DELAYED_RELEASE_TABLET | Freq: Every day | ORAL | Status: DC

## 2018-09-29 MED ORDER — METFORMIN HCL 500 MG PO TABS: 500.0000 mg | ORAL_TABLET | Freq: Two times a day (BID) | ORAL | 0 refills | Status: DC

## 2018-09-29 NOTE — Progress Notes (Addendum)
Inpatient Diabetes Program Recommendations  AACE/ADA: New Consensus Statement on Inpatient Glycemic Control (2015)  Target Ranges:  Prepandial:   less than 140 mg/dL      Peak postprandial:   less than 180 mg/dL (1-2 hours)      Critically ill patients:  140 - 180 mg/dL   Results for Evan Wright, Evan Wright (MRN [id number]) as of 09/29/2018 09:44  Ref. Range 09/28/2018 14:00 09/28/2018 21:16 09/28/2018 23:38 09/29/2018 07:37  Glucose-Capillary Latest Ref Range: 70 - 99 mg/dL 421 (H)  10 units 70/30 Insulin given at 1pm 283 (H)  5 units NOVOLOG  318 (H)  4 units NOVOLOG  231 (H)  3 units NOVOLOG    Results for Evan Wright, Evan Wright (MRN [id number]) as of 09/29/2018 09:44  Ref. Range 12/25/2017 10:50 09/22/2018 10:50  Hemoglobin A1C Latest Ref Range: 4.8 - 5.6 % 6.4 (H) 10.9 (H)    Admit with: Uncontrolled right arm movements, fall with head injury/ SDH  History: DM  Home DM Meds: None--Diet Controlled  Current Orders: Novolog Sensitive Correction Scale/ SSI (0-9 units) TID AC + HS     MD- Note pt's current A1c is quite a bit elevated from the last time it was checked (was 6.4% back in June 2019).  Pt just saw his PCP (Dr. Etter Sjogren with [facility]) on 09/22/2018.  A1c was checked at that visit but no meds prescribed.  May consider starting patient on oral medication for home and have him follow up with PCP.  Given his age and risk for falling, may consider oral DM medication with lower risk for Hypoglycemia.  Recommend Tradjenta 5 mg Daily for home and follow up with PCP soon after discharge  Do not recommend Sulfonylureas like Glipizide or Amaryl b/c if pt doesn't eat and takes this med, he would be at risk for Hypoglycemia.  Per ADA Guidelines, goal A1c for elderly patients with co-morbidities would be <8%-8.5%.  Patient will also need CBG meter at time of d/c:  CBG Meter and Supplies- Order #[id number]     Addendum 11:30am- Met with pt and his wife and daughter.  Discussed A1C results with them and  explained what an A1C is, basic pathophysiology of DM Type 2, basic home care, basic diabetes diet nutrition principles, importance of checking CBGs and maintaining good CBG control to prevent long-term and short-term complications.  Also reviewed blood sugar goals and A1c goals for home.    Patient kept telling me that he ate pecan pie and drank a lot of sweet tea the weekend prior to having his most recent A1c tested.  Asked me if that could have affected his A1c results.  Discussed with pt and family that some food indiscretions over the weekend could affect your current CBG results, however, explained that an A1c is reflective of CBG control over 2-3 months and that his A1c of 10.9% is more reflective of poor CBG control over a longer period.  Tried to also explain to pt and family that sometimes pts need medication to help control CBGs b/c the pancreas may need assistance over time.  Discussed with pt and family that there are many different oral medication options and that an appropriate goal A1c for patient (per the ADA guidelines given pt's age of 82 years would be an A1c closer to 8%).  Pt is at higher risk for falls given his age and oral diabetes meds that have higher risk of Hypoglycemia might should be avoided (for example Sulfonylureas like Glipizide).  Also discussed DM diet information with patient.  Encouraged patient to avoid beverages with sugar (regular soda, sweet tea, lemonade, fruit juice) and to consume mostly water.  Discussed what foods contain carbohydrates and how carbohydrates affect the body's blood sugar levels.  Encouraged patient to be careful with his portion sizes (especially grains, starchy vegetables, and fruits).  Explained to pt that he can indulge in sweets every now and then, but that he should strive to eat a healthy balanced carb modified diet the majority of the time.  Encouraged consumption of fresh vegetables and protein and discussed with pt and family that  carbohydrates are OK to eat, but that he should be careful with portion sizes of carbs.  Pt's daughter told me that pt was able to lower his A1c to 6.4% last year with diet modification and taht she was hopeful he could do it again.  Pt also asked me about Boost oral supplements.  Told me that someone told him he should drink Boost all the time so that the Boost can "even out his blood sugar".  I explained to pt and family that oral supplements like Boost are usually used as meal replacements or supplements for pts who are malnourished.  I encouraged pt to try to eat food and not drink supplements if he is able.  If supplements are desired, would switch to Glucerna.  Family requested names of local DM specialists in case they decide they would like for pt to see an ENDO.  Left the names of several local ENDOs in the AVS.  Explained to pt that he will likely need a referral to see and ENDO and that he should speak with his PCP about this as well.     --Will follow patient during hospitalization--  Wyn Quaker RN, MSN, CDE Diabetes Coordinator Inpatient Glycemic Control Team Team Pager: [phone] (8a-5p)

## 2018-09-29 NOTE — Evaluation (Signed)
Physical Therapy Evaluation Patient Details Name: Evan Wright MRN: [id number] DOB: 1925/07/11 Today's Date: 09/29/2018   History of Present Illness  Patient is a 85 y/o male who presents with RUE jerking movements, weakness, numbness and gait difficulty s/p fall. Head CT- left SDH with mass effect and right frontal scalp hematoma. PMH includes prostate ca, HLD, CAD.  Clinical Impression  Patient presents with generalized weakness, impaired balance, dizziness and impaired mobility s/p above. Tolerated transfers and gait training with Min A for balance/safety. Pt lives with elderly spouse and reports using SPC vs RW PTA. Reports 3 falls in last 6 months. Discussed in detail about fall risk reduction and activities pt should not be doing anymore. Encouraged use of RW at all times with mobility to decrease further falls. Will follow acutely to maximize independence and mobility prior to return home. VSS throughout despite dizziness.     Follow Up Recommendations Home health PT;Supervision for mobility/OOB    Equipment Recommendations  None recommended by PT    Recommendations for Other Services OT consult     Precautions / Restrictions Precautions Precautions: Fall Precaution Comments: dizziness, multiple falls at home Restrictions Weight Bearing Restrictions: No      Mobility  Bed Mobility Overal bed mobility: Needs Assistance Bed Mobility: Supine to Sit     Supine to sit: Min assist     General bed mobility comments: Multiple attempts using body momentem to get to EOB with trunk rocking back and forth. + dizziness.   Transfers Overall transfer level: Needs assistance Equipment used: Rolling walker (2 wheeled) Transfers: Sit to/from Stand Sit to Stand: Min assist         General transfer comment: Assist to power to standing with cues for technique/safety. Use of momentum. Slow to rise. Transferred to chair post ambulation.  Ambulation/Gait Ambulation/Gait assistance:  Min assist Gait Distance (Feet): 120 Feet Assistive device: Rolling walker (2 wheeled) Gait Pattern/deviations: Step-through pattern;Decreased stride length;Trunk flexed Gait velocity: decreased   General Gait Details: Slow, mildly unsteady gait with RW for support; 1 LOB with turning requiring min A to recover.   Stairs            Wheelchair Mobility    Modified Rankin (Stroke Patients Only) Modified Rankin (Stroke Patients Only) Pre-Morbid Rankin Score: Slight disability Modified Rankin: Moderately severe disability     Balance Overall balance assessment: Needs assistance;History of Falls Sitting-balance support: Feet supported;No upper extremity supported Sitting balance-Leahy Scale: Fair Sitting balance - Comments: posterior lean with AROM of BLEs.  Postural control: Posterior lean Standing balance support: During functional activity;Bilateral upper extremity supported Standing balance-Leahy Scale: Poor Standing balance comment: Requires UE support for standing balance.                              Pertinent Vitals/Pain Pain Assessment: No/denies pain    Home Living Family/patient expects to be discharged to:: Private residence Living Arrangements: Spouse/significant other Available Help at Discharge: Family;Available 24 hours/day Type of Home: House Home Access: Stairs to enter Entrance Stairs-Rails: Right Entrance Stairs-Number of Steps: 3 Home Layout: Two level;Laundry or work area in [place]: Environmental consultant - 2 wheels;Cane - single point      Prior Function Level of Independence: Independent with assistive device(s)         Comments: Uses SPC and RW PRN. Reports 3 falls in last 6 months. Drives.      Hand Dominance  Extremity/Trunk Assessment   Upper Extremity Assessment Upper Extremity Assessment: Defer to OT evaluation    Lower Extremity Assessment Lower Extremity Assessment: (Giveway weakness with resistance  throughout BLEs) RLE Sensation: history of peripheral neuropathy LLE Sensation: history of peripheral neuropathy    Cervical / Trunk Assessment Cervical / Trunk Assessment: Kyphotic  Communication   Communication: No difficulties  Cognition Arousal/Alertness: Awake/alert Behavior During Therapy: WFL for tasks assessed/performed Overall Cognitive Status: Within Functional Limits for tasks assessed                                 General Comments: for basic mobility tasks. A&Ox4. Has some awareness of things he should not be doing anymore (climbing on stools, changing light bulbs) but still trying to do other things that are unsafe. (trimming bushes in yard). Question wife's cognition as well (present in room), picking up scanning on computer as if its a phone and trying to answer it.      General Comments General comments (skin integrity, edema, etc.): Wife present during session.    Exercises     Assessment/Plan    PT Assessment Patient needs continued PT services  PT Problem List Decreased strength;Decreased balance;Decreased cognition;Decreased mobility;Decreased safety awareness       PT Treatment Interventions Functional mobility training;Balance training;Patient/family education;Gait training;Therapeutic activities;Therapeutic exercise;Neuromuscular re-education;Stair training    PT Goals (Current goals can be found in the Care Plan section)  Acute Rehab PT Goals Patient Stated Goal: to go home today PT Goal Formulation: With patient/family Time For Goal Achievement: 10/13/18 Potential to Achieve Goals: Good    Frequency Min 4X/week   Barriers to discharge        Co-evaluation               AM-PAC PT "6 Clicks" Mobility  Outcome Measure Help needed turning from your back to your side while in a flat bed without using bedrails?: None Help needed moving from lying on your back to sitting on the side of a flat bed without using bedrails?: A  Little Help needed moving to and from a bed to a chair (including a wheelchair)?: A Little Help needed standing up from a chair using your arms (e.g., wheelchair or bedside chair)?: A Little Help needed to walk in hospital room?: A Little Help needed climbing 3-5 steps with a railing? : A Little 6 Click Score: 19    End of Session Equipment Utilized During Treatment: Gait belt Activity Tolerance: Treatment limited secondary to medical complications (Comment)(dizziness) Patient left: in chair;with call bell/phone within reach;with chair alarm set;with family/visitor present Nurse Communication: Mobility status PT Visit Diagnosis: Dizziness and giddiness (R42);Unsteadiness on feet (R26.81);Muscle weakness (generalized) (M62.81);Difficulty in walking, not elsewhere classified (R26.2)    Time: 9892-1194 PT Time Calculation (min) (ACUTE ONLY): 37 min   Charges:   PT Evaluation $PT Eval Moderate Complexity: 1 Mod PT Treatments $Gait Training: 8-22 mins        Wray Kearns, PT, DPT Acute Rehabilitation Services Pager [phone] Office [place] 09/29/2018, 9:34 AM

## 2018-09-29 NOTE — Progress Notes (Signed)
EEG Completed; Results Pending  

## 2018-09-29 NOTE — Plan of Care (Signed)
Nutrition Education Note  RD consult for DM Education.   Lab Results  Component Value Date   HGBA1C 10.9 (H) 09/22/2018   Handouts given to the Pt were provided from [facility] Nutrition Care Manual and included:  Carbohydrate counting for people with Diabetes, Diabetes label reading tips, and Using nutrition Labels: Carbohydrates.  Reviewed diet with pt. Pt at home typically eats fast food breakfast, such as a bacon or sausage and egg biscuit. For both lunch and dinner, typical protein, starch and vegetable. Other items discussed were coffee with some cream no sugar, and sweet tea. Both pt and wife were big on canned fruits, they were able to tell what types of canned items to buy and what not to buy. Wife make tea generally with splenda, but has on occasion used sugar.  Points discussed from education was moderating intake of sugar in items such as white breads and potatoes and limiting the usage of sugar in coffee or tea and using splenda or unsweetened. Focused on eating consistent amount of 45-70g of carbohydrates per meals and generally 15-30g per snacks. Additionally the plate method was used and pt was very receptive to tell me food items to go into each category. Teach back method used; pt was able to tell me about what to put on the plate and recalled the types of starchy or non-starchy vegetables to eat. Pt was able to recall talking about canned fruits and switching to a lower or no sugar added syrup version. Left handouts with pts family to continue to read through.   Pt expected compliance is good.   Body mass index is 23.52 kg/m. Normal BMI.   Pt is on CHO Mod. Diet. Pt meal completion from tray in room was 100%  Labs and medications reviewed. No further nutrition interventions at this time. If further nutrition needs arise, please consult RD. Pt family/daughters didn't think that outpatient DM diet education referral was necessary at the moment.   Herma Carson, [place]  Dietetic Intern

## 2018-09-29 NOTE — Consult Note (Addendum)
Neurology Consultation  Reason for Consult: Abnormal movements of right arm Referring Physician: Dr. Algis Liming  History is obtained from wife and husband along with chart  HPI: Evan Wright is a 85 y.o. male with history of osteoarthritis, prostate cancer, osteopenia with loss of height, hyperlipidemia, diverticulosis, coronary artery disease and diabetes.  Patient was with family and friends eating food; his wife states he had some pecan pie, sweet tea.  At that point he was noted to have uncontrollable movements of his right upper extremity.  She describes it not as a rhythmic jerking but it was a writhing movement involving multiple joints. He had a couple episodes during the day that lasted approximately 2 minutes involving weakness of the right arm.  Patient was aware of the episodes and had no changes in consciousness as well as not endorsing any tongue biting, B/B incontinence or other abnormal movements.  This occurred on Thursday, 09/24/2018.  On 09/28/2018 patient reports that he fell due to loss of balance and had been observed to have recurrent flailing movements of his RUE at that time. He states that because of this he could not grab the grab-bar in his bathroom, which is why he fell. He struck his right forehead during the fall, resulting in a bruise, but does not endorse LOC. He has fallen several times in the last several months, the last time prior to the most recent event occurring 3 months ago.  He does also note that over the last couple weeks he has felt dizzy but not with vertigo when he gets up and walks.  He also has bilateral lower extremity numbness secondary to neuropathy.    While in the ED patient did obtain a CT of head that showed a small left subdural hematoma with no evidence of midline shift. Currently patient is receiving an EEG.  Wife states he is back to his baseline.  But she does also make note that although he was very on track with his blood sugars over the past week or  so he has fallen off and not been strict about his blood sugars.  While in the ED it was also noted that his blood sugar was 530.  He has no history of seizures. Of note, family states that the RUE movements would often occur when he tried to use the arm and go away at rest. Additionally, the movements would resolve or improve significantly after stimulation of the RUE with shoulder and upper arm massage.    ROS: A 14 point ROS was performed and is negative except as noted in the HPI.   Past Medical History:  Diagnosis Date  . Adenomatous colon polyp   . Angina pectoris ([place]) 04/2017  . CAD (coronary artery disease)    a. Cath 04/29/17-> high grade pLAD with R to L collaterals, high grade 1st dig  2. Cath 05/01/17 s/p ostial to mLAD with DES x 2.   . Cardiac murmur   . Coronary artery disease   . Diverticulosis   . GERD (gastroesophageal reflux disease)   . History of hiatal hernia   . Hyperlipidemia   . Loss of height 06/11/2016  . Osteoarthritis   . Prostate cancer [facility])     Family History  Problem Relation Age of Onset  . Colon cancer Brother   . Lung cancer Father   . Prostate cancer Father   . Colitis Mother     Social History:   reports that he has never smoked. He has  never used smokeless tobacco. He reports that he does not drink alcohol or use drugs.  Medications  Current Facility-Administered Medications:  .  0.9 %  sodium chloride infusion, 250 mL, Intravenous, PRN, Opyd, Ilene Qua, MD, Last Rate: 10 mL/hr at 09/29/18 0216, 250 mL at 09/29/18 0216 .  acetaminophen (TYLENOL) tablet 650 mg, 650 mg, Oral, Q6H PRN **OR** acetaminophen (TYLENOL) suppository 650 mg, 650 mg, Rectal, Q6H PRN, Opyd, Ilene Qua, MD .  atorvastatin (LIPITOR) tablet 80 mg, 80 mg, Oral, q1800, Opyd, Timothy S, MD .  bisacodyl (DULCOLAX) EC tablet 5 mg, 5 mg, Oral, Daily PRN, Opyd, Ilene Qua, MD .  HYDROcodone-acetaminophen (NORCO/VICODIN) 5-325 MG per tablet 1 tablet, 1 tablet, Oral, Q4H PRN,  Opyd, Timothy S, MD .  insulin aspart (novoLOG) injection 0-5 Units, 0-5 Units, Subcutaneous, QHS, Opyd, Ilene Qua, MD, 4 Units at 09/28/18 2349 .  insulin aspart (novoLOG) injection 0-9 Units, 0-9 Units, Subcutaneous, TID WC, Opyd, Ilene Qua, MD, 3 Units at 09/29/18 [phone] .  levETIRAcetam (KEPPRA) tablet 500 mg, 500 mg, Oral, BID, Opyd, Ilene Qua, MD, 500 mg at 09/28/18 2349 .  levothyroxine (SYNTHROID, LEVOTHROID) tablet 50 mcg, 50 mcg, Oral, Q0600, Opyd, Ilene Qua, MD, 50 mcg at 09/29/18 0558 .  ondansetron (ZOFRAN) tablet 4 mg, 4 mg, Oral, Q6H PRN **OR** ondansetron (ZOFRAN) injection 4 mg, 4 mg, Intravenous, Q6H PRN, Opyd, Timothy S, MD .  pantoprazole (PROTONIX) EC tablet 20 mg, 20 mg, Oral, Daily, Opyd, Timothy S, MD .  polyethylene glycol (MIRALAX / GLYCOLAX) packet 17 g, 17 g, Oral, Daily PRN, Opyd, Timothy S, MD .  sodium chloride flush (NS) 0.9 % injection 3 mL, 3 mL, Intravenous, Q12H, Opyd, Timothy S, MD .  sodium chloride flush (NS) 0.9 % injection 3 mL, 3 mL, Intravenous, Q12H, Opyd, Timothy S, MD .  sodium chloride flush (NS) 0.9 % injection 3 mL, 3 mL, Intravenous, PRN, Opyd, Ilene Qua, MD   Exam: Current vital signs: BP 137/64 (BP Location: Right Arm)   Pulse (!) 55   Temp 98.1 F (36.7 C) (Oral)   Resp 16   Ht 5\' 11"  (1.803 m)   Wt 76.5 kg   SpO2 94%   BMI 23.52 kg/m  Vital signs in last 24 hours: Temp:  [97.8 F (36.6 C)-98.1 F (36.7 C)] 98.1 F (36.7 C) (03/10 0740) Pulse Rate:  [55-66] 55 (03/10 0740) Resp:  [13-20] 16 (03/10 0740) BP: (116-182)/(55-93) 137/64 (03/10 0740) SpO2:  [94 %-100 %] 94 % (03/10 0740) Weight:  [73.9 kg-76.5 kg] 76.5 kg (03/10 0500)  Physical Exam  Constitutional: Appears well-developed and well-nourished.  Psych: Affect appropriate to situation Eyes: No scleral injection HENT: No OP obstrucion Head: Normocephalic. Right forehead bruising. Cardiovascular: Normal rate and regular rhythm.  Respiratory: Effort normal, non-labored  breathing GI: Soft.  No distension. There is no tenderness.  Skin: WDI  Neuro: Mental Status: Patient is awake, alert, oriented to person, place, month, year, and situation. Patient is able to give a clear and coherent history. No signs of aphasia or neglect. No dysarthria.   Cranial Nerves: II: Visual Fields are full.  III,IV, VI: EOMI without ptosis or diploplia. Pupils are equal, round, and reactive to light.   V: Facial sensation is symmetric to temperature VII: Facial movement is symmetric.  VIII: hearing is intact to voice X: Uvula elevates symmetrically XI: Shoulder shrug is symmetric. XII: tongue is midline without atrophy or fasciculations.  Motor: Tone is normal. Bulk is normal.  Right  arm has 4/5 strength however all other extremities are 5/5 strength. No abnormal movements noted.  Sensory: Sensation is symmetric to light touch and temperature in the arms and legs.  Deep Tendon Reflexes: 1+ and symmetric in the biceps and patellae.  Plantars: Toes are downgoing bilaterally.  Cerebellar: FNF shows no dysmetria  Labs I have reviewed labs in epic and the results pertinent to this consultation are:   CBC    Component Value Date/Time   WBC 6.7 09/29/2018 0412   RBC 4.31 09/29/2018 0412   HGB 13.2 09/29/2018 0412   HGB 14.7 09/22/2018 1050   HCT 38.7 (L) 09/29/2018 0412   HCT 43.9 09/22/2018 1050   PLT 182 09/29/2018 0412   PLT 221 09/22/2018 1050   MCV 89.8 09/29/2018 0412   MCV 92 09/22/2018 1050   MCH 30.6 09/29/2018 0412   MCHC 34.1 09/29/2018 0412   RDW 11.9 09/29/2018 0412   RDW 11.7 09/22/2018 1050   LYMPHSABS 1.1 09/28/2018 1136   LYMPHSABS 1.2 08/14/2017 1407   MONOABS 0.5 09/28/2018 1136   EOSABS 0.0 09/28/2018 1136   EOSABS 0.1 08/14/2017 1407   BASOSABS 0.0 09/28/2018 1136   BASOSABS 0.0 08/14/2017 1407    CMP     Component Value Date/Time   NA 141 09/29/2018 0412   NA 140 09/22/2018 1050   K 3.3 (L) 09/29/2018 0412   CL 110 09/29/2018  0412   CO2 25 09/29/2018 0412   GLUCOSE 198 (H) 09/29/2018 0412   BUN 19 09/29/2018 0412   BUN 16 09/22/2018 1050   CREATININE 0.94 09/29/2018 0412   CALCIUM 9.2 09/29/2018 0412   PROT 7.4 09/28/2018 1136   PROT 7.1 09/22/2018 1050   ALBUMIN 4.2 09/28/2018 1136   ALBUMIN 4.7 (H) 09/22/2018 1050   AST 20 09/28/2018 1136   ALT 27 09/28/2018 1136   ALKPHOS 86 09/28/2018 1136   BILITOT 0.9 09/28/2018 1136   BILITOT 0.7 09/22/2018 1050   GFRNONAA >60 09/29/2018 0412   GFRAA >60 09/29/2018 0412    Lipid Panel     Component Value Date/Time   CHOL 147 09/22/2018 1050   TRIG 160 (H) 09/22/2018 1050   HDL 41 09/22/2018 1050   CHOLHDL 3.6 09/22/2018 1050   CHOLHDL 3 09/25/2017 1035   VLDL 25.2 09/25/2017 1035   LDLCALC 74 09/22/2018 1050   LDLDIRECT 165.3 10/08/2010 1008     Imaging I have reviewed the images obtained:  CT-scan of the brain-small left subdural hematoma  MRI examination of the brain- pending  Etta Quill PA-C [facility] [phone] 09/29/2018, 9:41 AM     Assessment: 85 year-old male with acute onset of choreiform movements of the right arm.  Likely secondary to elevated blood glucose. 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 Additionally, the patient does have a small left subdural hematoma which appears to be directly adjacent to the left motor strip, which could also predispose to this syndrome through cortical irritability, per the literature.   1. At this time he is back to his baseline.   2. EEG shows mild diffuse slowing with no epileptiform discharges. 3. Of note, regarding the DDx of seizures initially, he has no history of seizures. The family states that the RUE movements would often occur when he tried to use the arm and go away at rest. Additionally, the movements would resolve or improve significantly after stimulation of the RUE with shoulder and upper arm  massage. These clinical features would be highly atypical for seizure.   4. Dizziness. May be secondary to hypotension.  Appears somewhat dry, which may have contributed to his dizziness with fall.  5. Fall with resulting left subdural hematoma   Recommendations: - Discontinue Keppra - Glycemic control - Restart antiplatelet agent for cardiac indication on Thursday to decrease the likelihood of enlargement of the left subdural if it is acute. Will need Cardiology guidance on whether or not DAPT is indicated or if ASA alone is sufficient since it has been 1.5 years since placement of his DES x 2 - Repeat CT head in 1 month - Encourage hydration.   I have seen and examined the patient. I have formulated the assessment and plan. Electronically signed: Dr. Kerney Elbe

## 2018-09-29 NOTE — Discharge Instructions (Signed)
[facility] [phone]) 1. Dr. Philemon Kingdom 2. Dr. Elayne Snare [facility] [phone]) 1. Dr. Jacelyn Pi 2. Dr. [facility] [phone]) [ office]   1. Dr. Lenna Sciara [place] 2. Dr. Judithann Sheen [facility]) [phone]) 1. Autumn Hudnall Ronnald Ramp), PA 2. Dr. Amalia Greenhouse 3. Dr. Marsh Dolly.   Symptoms of Hypoglycemia: Silly, Sweaty, Shaky Check sugar if you have your meter.  If near or less than 70 mg/dl, treat with 1/2 cup juice or soda or take glucose tablets Check sugar 15 minutes after treatment.  If sugar still near or less than 70 mg/al and symptomatic, treat again and may need a snack with some protein (peanut butter with crackers, etc)  Fingerstick glucose (sugar) goals for home: Before meals: 80-130 mg/dl 2-Hours after meals: less than 180 mg/dl Hemoglobin A1c goal: 7% or less  Additional discharge instructions  Please get your medications reviewed and adjusted by your Primary MD.  Please request your Primary MD to go over all Hospital Tests and Procedure/Radiological results at the follow up, please get all Hospital records sent to your Prim MD by signing hospital release before you go home.  If you had Pneumonia of Lung problems at the Hospital: Please get a 2 view Chest X ray done in 6-8 weeks after hospital discharge or sooner if instructed by your Primary MD.  If you have Congestive Heart Failure: Please call your Cardiologist or Primary MD anytime you have any of the following symptoms:  1) 3 pound weight gain in 24 hours or 5 pounds in 1 week  2) shortness of breath, with or without a dry hacking cough  3) swelling in the hands, feet or stomach  4) if you have to sleep on extra pillows at night in order to breathe  Follow cardiac low salt diet and 1.5 lit/day fluid restriction.  If you have diabetes Accuchecks 4 times/day, Once in AM empty  stomach and then before each meal. Log in all results and show them to your primary doctor at your next visit. If any glucose reading is under 80 or above 300 call your primary MD immediately.  If you have Seizure/Convulsions/Epilepsy: Please do not drive, operate heavy machinery, participate in activities at heights or participate in high speed sports until you have seen by Primary MD or a Neurologist and advised to do so again.  If you had Gastrointestinal Bleeding: Please ask your Primary MD to check a complete blood count within one week of discharge or at your next visit. Your endoscopic/colonoscopic biopsies that are pending at the time of discharge, will also need to followed by your Primary MD.  Get Medicines reviewed and adjusted. Please take all your medications with you for your next visit with your Primary MD  Please request your Primary MD to go over all hospital tests and procedure/radiological results at the follow up, please ask your Primary MD to get all Hospital records sent to his/her office.  If you experience worsening of your admission symptoms, develop shortness of breath, life threatening emergency, suicidal or homicidal thoughts you must seek medical attention immediately by calling 911 or calling your MD immediately  if symptoms less severe.  You must read complete instructions/literature along with all the possible adverse reactions/side effects for all the Medicines you take and that have been prescribed to you. Take any new Medicines after you have completely understood and accpet all the possible adverse reactions/side effects.   Do not drive or operate  heavy machinery when taking Pain medications.   Do not take more than prescribed Pain, Sleep and Anxiety Medications  Special Instructions: If you have smoked or chewed Tobacco  in the last 2 yrs please stop smoking, stop any regular Alcohol  and or any Recreational drug use.  Wear Seat belts while  driving.  Please note You were cared for by a hospitalist during your hospital stay. If you have any questions about your discharge medications or the care you received while you were in the hospital after you are discharged, you can call the unit and asked to speak with the hospitalist on call if the hospitalist that took care of you is not available. Once you are discharged, your primary care physician will handle any further medical issues. Please note that NO REFILLS for any discharge medications will be authorized once you are discharged, as it is imperative that you return to your primary care physician (or establish a relationship with a primary care physician if you do not have one) for your aftercare needs so that they can reassess your need for medications and monitor your lab values.  You can reach the hospitalist office at phone [phone] or fax [phone]   If you do not have a primary care physician, you can call [phone] for a physician referral.

## 2018-09-29 NOTE — TOC Initial Note (Signed)
Transition of Care [facility]) - Initial/Assessment Note    Patient Details  Name: Evan Wright MRN: [id number] Date of Birth: 1925/02/23  Transition of Care [facility]) CM/SW Contact:    Pollie Friar, RN Phone Number: 09/29/2018, 12:55 PM  Clinical Narrative:                   Expected Discharge Plan: [place] Barriers to Discharge: Continued Medical Work up   Patient Goals and CMS Choice Patient states their goals for this hospitalization and ongoing recovery are:: to get back home with wife      Expected Discharge Plan and Services Expected Discharge Plan: [place] Discharge Planning Services: CM Consult   Living arrangements for the past 2 months: Single Family Home                          Prior Living Arrangements/Services Living arrangements for the past 2 months: Single Family Home Lives with:: Spouse Patient language and need for interpreter reviewed:: Yes(none) Do you feel safe going back to the place where you live?: Yes      Need for Family Participation in Patient Care: Yes (Comment)(pt unable to drive after d/c) Care giver support system in place?: Yes (comment)(daughters able to assist) Current home services: DME(cane, walker, handicap bars) Criminal Activity/Legal Involvement Pertinent to Current Situation/Hospitalization: No - Comment as needed  Activities of Daily Living      Permission Sought/Granted Permission sought to share information with : Family Supports(daughters and wife)                Emotional Assessment Appearance:: Appears stated age Attitude/Demeanor/Rapport: Engaged Affect (typically observed): Accepting, Appropriate Orientation: : Oriented to Place, Oriented to  Time, Oriented to Situation, Oriented to Self Alcohol / Substance Use: Not Applicable Psych Involvement: No (comment)  Admission diagnosis:  SDH (subdural hematoma) ([place]) [S06.5X9A] Hyperglycemia [R73.9] Patient Active Problem  List   Diagnosis Date Noted  . SDH (subdural hematoma) ([place]) 09/28/2018  . Seizure-like activity ([place]) 09/28/2018  . Right arm weakness 09/28/2018  . Preventative health care 09/23/2018  . Uncontrolled diabetes mellitus with hyperglycemia ([place]) 09/23/2018  . Osteopenia 09/25/2017  . Essential hypertension 09/25/2017  . Hyperglycemia 09/25/2017  . Hypothyroidism 09/25/2017  . CAD (coronary artery disease), native coronary artery 04/29/2017  . Nonsustained ventricular tachycardia ([place]) 04/24/2017  . Ischemic cardiomyopathy 04/10/2017  . Mixed dyslipidemia 04/10/2017  . Abnormal EKG 04/02/2017  . Idiopathic peripheral neuropathy 10/11/2016  . Proximal leg weakness 10/11/2016  . Weakness of both lower extremities 06/11/2016  . De Quervain's tenosynovitis 05/18/2012  . Family history of malignant neoplasm of gastrointestinal tract 05/14/2011  . NEVI, MULTIPLE 07/12/2010  . OSTEOARTHRITIS 07/12/2010  . UNSPECIFIED VITAMIN D DEFICIENCY 02/11/2008  . HIATAL HERNIA WITH REFLUX 02/11/2008  . GASTROENTERITIS 01/20/2008  . PERSONAL HISTORY OF COLONIC POLYPS 11/17/2007  . DIZZINESS 10/15/2007  . PROSTATE CANCER, HX OF 10/15/2007   PCP:  [name], DO Pharmacy:   [id number] - [place], [place] [address] [place] [address] Phone: [phone] Fax: [place], [place] - [address] [facility] [place] [address] Phone: [phone] Fax: [place], [place] - [address] [place] [address] Phone: [phone] Fax: [phone]     Social Determinants of Health (SDOH) Interventions  Readmission Risk Interventions 30 Day Unplanned Readmission Risk Score     ED to Hosp-Admission (Current) from 09/28/2018 in [facility]  30 Day Unplanned Readmission Risk Score (%)  10 Filed at 09/29/2018 1200      This score is the patient's risk of an unplanned readmission within 30 days of being discharged (0 -100%). The score is based on dignosis, age, lab data, medications, orders, and past utilization.   Low:  0-14.9   Medium: 15-21.9   High: 22-29.9   Extreme: 30 and above       No flowsheet data found.

## 2018-09-29 NOTE — Procedures (Signed)
ELECTROENCEPHALOGRAM REPORT   Patient: Evan Wright       Room #: 1S97W EEG No. ID: [id number] Age: 85 y.o.        Sex: male Referring Physician: Hongalgi Report Date:  09/29/2018        Interpreting Physician: Alexis Goodell  History: Evan Wright is an 85 y.o. male with episodes of involuntary RUE movements  Medications:  Lipitor, Insulin, Keppra, Synthroid, Protonix  Conditions of Recording:  This is a 21 channel routine scalp EEG performed with bipolar and monopolar montages arranged in accordance to the international 10/20 system of electrode placement. One channel was dedicated to EKG recording.  The patient is in the awake state.  Description:  The waking background activity consists of a low voltage, symmetrical, fairly well organized, 7 Hz theta activity, seen from the parieto-occipital and posterior temporal regions.  Low voltage fast activity, poorly organized, is seen anteriorly and is at times superimposed on more posterior regions.  A mixture of theta and alpha rhythms are seen from the central and temporal regions. The patient does not drowse or sleep. No epileptiform activity is noted.   Hyperventilation and intermittent photic stimulation were not performed.   IMPRESSION: This is an abnormal EEG secondary to mild posterior background slowing.  This finding may be seen with a diffuse gray matter disturbance that is etiologically nonspecific, but may include a dementia, among other possibilities.  No epileptiform activity is noted.    Alexis Goodell, MD Neurology [phone] 09/29/2018, 10:20 AM

## 2018-09-29 NOTE — Discharge Summary (Addendum)
Physician Discharge Summary  Evan Wright GYK:[id number] DOB: 1924-10-31  PCP: [name], DO  Admit date: 09/28/2018 Discharge date: 09/29/2018  Recommendations for Outpatient Follow-up:  1. Dr. Roma Schanz PCP in 3 days with repeat labs (CBC & BMP). 2. Recommend repeating CT head in 1 month to reassess subdural hematoma for resolution.   Home Health: PT Equipment/Devices: None  Discharge Condition: Improved and stable CODE STATUS: Full Diet recommendation: Heart healthy & diabetic diet  Discharge Diagnoses:  Principal Problem:   SDH (subdural hematoma) (HCC) Active Problems:   Ischemic cardiomyopathy   CAD (coronary artery disease), native coronary artery   Hypothyroidism   Uncontrolled diabetes mellitus with hyperglycemia (HCC)   Seizure-like activity (HCC)   Right arm weakness   Brief Summary: 85 year old married male, independent, PMH of CAD status post PCI >1-year ago, ischemic cardiomyopathy, hypothyroidism, DM 2, GERD, HLD presented with 5 days history of rhythmic involuntary right upper extremity movements followed by fall on day prior to admission.  In the ED, CT head showed small left subdural hematoma and blood glucose of 530.  Admitted for further evaluation and management.  Neurology was consulted.  Assessment and plan:  1. Choreiform movements of right upper extremity: Focal seizure disorder was initially suspected.  Neurology was consulted.  CT head showed small left subdural hematoma without evidence of mass-effect or midline shift.  Right frontal scalp hematoma.  No calvarial fracture.  No acute cervical spine abnormality.  As per Neurology, patient's acute onset choreiform movements of right arm are likely secondary to marked hyperglycemia.  I discussed in detail with Dr. Cheral Marker.  He alludes to several articles in the literature documenting syndrome of coria in the setting of hyperglycemia.  Additionally the patient has a small left subdural  hematoma which appears to be directly adjacent to the left motor strip which could also predispose to this syndrome through cortical irritability.  No recurrence of these events in the hospital.  Patient is back to baseline.  EEG shows no epileptiform discharges.  Specifically neurology indicates that this was not seizure.  He has no prior history of seizures.  There was no loss of consciousness, incontinence, tongue biting.  As per family, the right upper extremity movements would often occur when he tried to use the arm and go away at rest and additionally the movements would resolve or improve significantly after stimulation of the right upper extremity with shoulder and right upper arm massage and these clinical features would be highly atypical for seizure.  Dr. Cheral Marker recommended discontinuing Keppra, adequate glycemic control, no driving restrictions, restart antiplatelet agent on 3/12 (however I discussed with cardiologist on-call who reviewed patient's chart briefly including cardiac cath and PCI and advised that it has been >1-year since his intervention and recommended discontinuing Plavix and resuming aspirin 81 mg daily after a week to minimize bleeding complications).  Improved. 2. Type II DM with hyperglycemia: A1c 09/22/2018: 10.9.  In the past his A1c has ranged between 6.2-7.2.  Metformin was offered approximately a year ago but patient managed with diet alone with improvement in his A1c and never took metformin.  He presented with blood glucose of 530 but no DKA.  In the hospital he was placed on SSI.  I discussed extensively with patient and his family at bedside regarding options available to treat his DM.  I recommended insulins (Lantus/Levemir +/- NovoLog SSI) along with metformin at discharge.  Patient and family were extremely reluctant to start insulins despite advising  them that oral hypoglycemic agents may not work quickly enough or well enough given the degree of hyperglycemia.  They  declined insulins.  DM coordinator input appreciated and educated patient and family extensively.  Patient will be discharged on Tradjenta 5 mg daily and metformin 500 mg twice daily.  Close follow-up with PCP in the next couple of days for further evaluation and management as needed.  Provided patient with resources to follow-up with endocrinology as well. 3. Fall: Likely related to involuntary right upper extremity movements, inability to hold onto something and losing balance.  PT evaluated and recommended home health PT. 4. Small left subdural hematoma: EDP discussed with Neurosurgery who recommended holding antiplatelets and no need to repeat imaging.  As per neurology consultation, okay to resume antiplatelets in a couple of days and recommend repeating CT head in a month's time. 5. CAD/ischemic cardiomyopathy: Discussed with cardiologist on-call who recommended discontinuing Plavix indefinitely and resume aspirin 81 mg daily in a week's time.  Continue statins, carvedilol and Imdur. 6. Hypothyroidism: TSH normal.  Continue Synthroid. 7. GERD: Continue PPI. 8. Hypokalemia: Replaced.  Consultations:  Neurology  Procedures:  None   Discharge Instructions  Discharge Instructions    Call MD for:   Complete by:  As directed    Recurrent involuntary movements.   Call MD for:  extreme fatigue   Complete by:  As directed    Call MD for:  persistant dizziness or light-headedness   Complete by:  As directed    Call MD for:  persistant nausea and vomiting   Complete by:  As directed    Call MD for:  severe uncontrolled pain   Complete by:  As directed    Diet - low sodium heart healthy   Complete by:  As directed    Diet Carb Modified   Complete by:  As directed    Increase activity slowly   Complete by:  As directed        Medication List    STOP taking these medications   clopidogrel 75 MG tablet Commonly known as:  PLAVIX     TAKE these medications   aspirin EC 81 MG  tablet Take 1 tablet (81 mg total) by mouth daily at 12 noon. Start taking on:  October 06, 2018 What changed:    when to take this  These instructions start on October 06, 2018. If you are unsure what to do until then, ask your doctor or other care provider.   atorvastatin 80 MG tablet Commonly known as:  LIPITOR Take 1 tablet (80 mg total) by mouth daily.   bismuth subsalicylate 382 NK/53ZJ suspension Commonly known as:  PEPTO BISMOL Take 30 mLs by mouth every 6 (six) hours as needed for indigestion or diarrhea or loose stools.   blood glucose meter kit and supplies Kit Dispense based on patient and insurance preference. Use up to four times daily as directed. (FOR ICD-9 250.00, 250.01).   CALCIUM+D3 PO Take 1 tablet by mouth daily at 12 noon.   carvedilol 3.125 MG tablet Commonly known as:  COREG TAKE ONE TABLET BY MOUTH TWICE DAILY What changed:  when to take this   isosorbide mononitrate 60 MG 24 hr tablet Commonly known as:  IMDUR Take 30 mg by mouth daily after breakfast. What changed:  Another medication with the same name was removed. Continue taking this medication, and follow the directions you see here.   lansoprazole 30 MG capsule Commonly known as:  PREVACID TAKE  ONE CAPSULE BY MOUTH EVERY DAY AT 12 NOON WITH FOOD What changed:  See the new instructions.   levothyroxine 50 MCG tablet Commonly known as:  SYNTHROID, LEVOTHROID TAKE ONE TABLET BY MOUTH DAILY What changed:  when to take this   Glimepiride 1 MG tablet Commonly known as:  Amaryl Take 1 tablet (1 mg total) by mouth daily.   MAGNESIUM PO Take 1 tablet by mouth daily at 12 noon.   METAMUCIL PO Take 1 Dose by mouth every 3 (three) days.   metFORMIN 500 MG tablet Commonly known as:  Glucophage Take 1 tablet (500 mg total) by mouth 2 (two) times daily with a meal.   nitroGLYCERIN 0.4 MG SL tablet Commonly known as:  NITROSTAT Place 1 tablet (0.4 mg total) under the tongue every 5 (five)  minutes as needed for chest pain.   PreserVision AREDS 2 Caps Take 1 capsule by mouth daily at 12 noon.   STOOL SOFTENER PO Take 1-2 capsules by mouth at bedtime.   SYSTANE OP Place 1 drop into both eyes daily as needed (dry eyes).      Follow-up Information    [name], DO. Schedule an appointment as soon as possible for a visit in 3 day(s).   Specialty:  Family Medicine Why:  To be seen with repeat labs (CBC & BMP). Contact information: [address] [place] [address] [phone]          Allergies  Allergen Reactions  . Penicillins Other (See Comments)    Unknown reaction Did it involve swelling of the face/tongue/throat, SOB, or low BP? Unknown Did it involve sudden or severe rash/hives, skin peeling, or any reaction on the inside of your mouth or nose? Unknown Did you need to seek medical attention at a hospital or doctor's office? Unknown When did it last happen?1940 If all above answers are "NO", may proceed with cephalosporin use.      Procedures/Studies: Dg Chest 2 View  Result Date: 09/28/2018 CLINICAL DATA:  Status post fall. EXAM: CHEST - 2 VIEW COMPARISON:  02/28/2018 FINDINGS: Cardiomediastinal silhouette is normal. Mediastinal contours appear intact. Calcific atherosclerotic disease of the aorta. There is no evidence of focal airspace consolidation, pleural effusion or pneumothorax. Osseous structures are without acute abnormality. Soft tissues are grossly normal. IMPRESSION: No active cardiopulmonary disease. Electronically Signed   By: Fidela Salisbury M.D.   On: 09/28/2018 12:01   Dg Elbow Complete Right  Result Date: 09/28/2018 CLINICAL DATA:  Status post fall with right elbow pain. EXAM: RIGHT ELBOW - COMPLETE 3+ VIEW COMPARISON:  None. FINDINGS: There is no evidence of fracture, dislocation, or joint effusion. Soft tissue swelling/hematoma anterior to the radial head. IMPRESSION: No acute fracture or  dislocation identified about the right elbow. Soft tissue swelling/hematoma anterior to the radial head. Electronically Signed   By: Fidela Salisbury M.D.   On: 09/28/2018 12:04   Ct Head Wo Contrast  Result Date: 09/28/2018 CLINICAL DATA:  Fall and head trauma.  Arm weakness. EXAM: CT HEAD WITHOUT CONTRAST CT CERVICAL SPINE WITHOUT CONTRAST TECHNIQUE: Multidetector CT imaging of the head and cervical spine was performed following the standard protocol without intravenous contrast. Multiplanar CT image reconstructions of the cervical spine were also generated. COMPARISON:  None. FINDINGS: CT HEAD FINDINGS Brain: Small amount of extra-axial blood along the left convexity near the vertex. Findings are suggestive for a small subdural hematoma. Blood is located in the left anterior parietal region. No other  areas are concerning for intracranial hemorrhage. No evidence for large infarct, mass lesion, midline shift or hydrocephalus. Mild cerebral atrophy. Small amount of low-density in the periventricular white matter. Vascular: No hyperdense vessel or unexpected calcification. Skull: Right frontal scalp hematoma without underlying fracture. No acute bone abnormality. Sinuses/Orbits: Visualized paranasal sinuses are clear. Other: None CT CERVICAL SPINE FINDINGS Alignment: Curvature of the lower cervical spine towards the right side. Skull base and vertebrae: No acute fracture. No primary bone lesion or focal pathologic process. Soft tissues and spinal canal: No prevertebral fluid or swelling. No visible canal hematoma. Disc levels: Multilevel disc space narrowing and endplate disease. Prominent facet disease on the left side at C3-C4. Upper chest: Lung apices are clear.  Negative for pneumothorax. Other: None IMPRESSION: 1. Small left subdural hematoma. No evidence for mass effect or midline shift. 2. Right frontal scalp hematoma.  No calvarial fracture. 3. Mild cerebral atrophy. Evidence for chronic small vessel  ischemic changes. 4. Degenerative changes in the cervical spine without acute bone abnormality. Critical Value/emergent results were called by telephone at the time of interpretation on 09/28/2018 at 12:05 pm to Dr. Nanda Quinton , who verbally acknowledged these results. Electronically Signed   By: Markus Daft M.D.   On: 09/28/2018 12:08   Ct Cervical Spine Wo Contrast  Result Date: 09/28/2018 CLINICAL DATA:  Fall and head trauma.  Arm weakness. EXAM: CT HEAD WITHOUT CONTRAST CT CERVICAL SPINE WITHOUT CONTRAST TECHNIQUE: Multidetector CT imaging of the head and cervical spine was performed following the standard protocol without intravenous contrast. Multiplanar CT image reconstructions of the cervical spine were also generated. COMPARISON:  None. FINDINGS: CT HEAD FINDINGS Brain: Small amount of extra-axial blood along the left convexity near the vertex. Findings are suggestive for a small subdural hematoma. Blood is located in the left anterior parietal region. No other areas are concerning for intracranial hemorrhage. No evidence for large infarct, mass lesion, midline shift or hydrocephalus. Mild cerebral atrophy. Small amount of low-density in the periventricular white matter. Vascular: No hyperdense vessel or unexpected calcification. Skull: Right frontal scalp hematoma without underlying fracture. No acute bone abnormality. Sinuses/Orbits: Visualized paranasal sinuses are clear. Other: None CT CERVICAL SPINE FINDINGS Alignment: Curvature of the lower cervical spine towards the right side. Skull base and vertebrae: No acute fracture. No primary bone lesion or focal pathologic process. Soft tissues and spinal canal: No prevertebral fluid or swelling. No visible canal hematoma. Disc levels: Multilevel disc space narrowing and endplate disease. Prominent facet disease on the left side at C3-C4. Upper chest: Lung apices are clear.  Negative for pneumothorax. Other: None IMPRESSION: 1. Small left subdural hematoma.  No evidence for mass effect or midline shift. 2. Right frontal scalp hematoma.  No calvarial fracture. 3. Mild cerebral atrophy. Evidence for chronic small vessel ischemic changes. 4. Degenerative changes in the cervical spine without acute bone abnormality. Critical Value/emergent results were called by telephone at the time of interpretation on 09/28/2018 at 12:05 pm to Dr. Nanda Quinton , who verbally acknowledged these results. Electronically Signed   By: Markus Daft M.D.   On: 09/28/2018 12:08      Subjective: Patient interviewed and examined this morning along with his spouse and daughter at bedside.  No recurrence of involuntary right upper extremity movements.  Has had polyuria, polydipsia for a long time.  Reportedly attempting to control his DM with diet alone.  Was not aware of recent A1c results from 3/3.  Reports previous fall approximately 8- 9 months  ago and then no falls until yesterday.  No headache, dizziness, lightheadedness, chest pain or palpitations.  Discharge Exam:  Vitals:   09/29/18 0500 09/29/18 0740 09/29/18 1154 09/29/18 1630  BP:  137/64 137/62 133/65  Pulse:  (!) 55 70 60  Resp:  16 18 16   Temp:  98.1 F (36.7 C) 97.7 F (36.5 C) 97.9 F (36.6 C)  TempSrc:  Oral Oral Oral  SpO2:  94% 100% 100%  Weight: 76.5 kg     Height:        General: Pleasant elderly male, moderately built and nourished lying comfortably propped up in bed. Cardiovascular: S1 & S2 heard, RRR, S1/S2 +. No murmurs, rubs, gallops or clicks. No JVD or pedal edema.  Telemetry personally reviewed: SB in the 50s-SR. Respiratory: Clear to auscultation without wheezing, rhonchi or crackles. No increased work of breathing. Abdominal:  Non distended, non tender & soft. No organomegaly or masses appreciated. Normal bowel sounds heard. CNS: Alert and oriented. No focal deficits.  No involuntary movements noted. Extremities: no edema, no cyanosis.  Symmetric 5 x 5 power.  Mild bruising around right  elbow/forearm. HEENT: Small area of superficial bruise over the right frontal scalp.    The results of significant diagnostics from this hospitalization (including imaging, microbiology, ancillary and laboratory) are listed below for reference.       Labs: CBC: Recent Labs  Lab 09/28/18 1136 09/29/18 0412  WBC 8.0 6.7  NEUTROABS 6.3  --   HGB 14.5 13.2  HCT 43.5 38.7*  MCV 90.2 89.8  PLT 187 076   Basic Metabolic Panel: Recent Labs  Lab 09/28/18 1136 09/29/18 0412  NA 134* 141  K 4.3 3.3*  CL 102 110  CO2 24 25  GLUCOSE 530* 198*  BUN 24* 19  CREATININE 1.09 0.94  CALCIUM 9.4 9.2  MG  --  1.8   Liver Function Tests: Recent Labs  Lab 09/28/18 1136  AST 20  ALT 27  ALKPHOS 86  BILITOT 0.9  PROT 7.4  ALBUMIN 4.2   Cardiac Enzymes: Recent Labs  Lab 09/28/18 1136  TROPONINI <0.03   CBG: Recent Labs  Lab 09/28/18 2116 09/28/18 2338 09/29/18 0737 09/29/18 1235 09/29/18 1719  GLUCAP 283* 318* 231* 376* 272*   Thyroid function studies Recent Labs    09/28/18 1310  TSH 1.761   Urinalysis    Component Value Date/Time   COLORURINE YELLOW 09/28/2018 1136   APPEARANCEUR CLEAR 09/28/2018 1136   LABSPEC 1.010 09/28/2018 1136   PHURINE 5.5 09/28/2018 1136   GLUCOSEU >=500 (A) 09/28/2018 1136   HGBUR NEGATIVE 09/28/2018 1136   HGBUR negative 07/12/2010 0941   BILIRUBINUR NEGATIVE 09/28/2018 1136   BILIRUBINUR neg 06/11/2016 [place] 09/28/2018 1136   PROTEINUR NEGATIVE 09/28/2018 1136   UROBILINOGEN 0.2 06/11/2016 1149   UROBILINOGEN negative 07/12/2010 0941   NITRITE NEGATIVE 09/28/2018 1136   LEUKOCYTESUR NEGATIVE 09/28/2018 1136   I discussed in detail with patient's spouse and daughter at bedside, updated care and answered questions.   Time coordinating discharge: 40 minutes  SIGNED:  Vernell Leep, MD, FACP, [facility]. [facility]  To contact the attending provider between 7A-7P or the covering provider during  after hours 7P-7A, please log into the web site [email or web address] and access using universal [place] password for that web site. If you do not have the password, please call the hospital operator.

## 2018-09-29 NOTE — Plan of Care (Signed)
Pt completed and met care plan for these admission

## 2018-09-29 NOTE — Progress Notes (Signed)
Pt d/c  Home with home health, pt is stable complained of mild vision loss, Nurse assessed pt reports vision problem is not new and would like to go home as ordered. D/c instructions done, pt verbalize understanding. Pt will be transported out of facility by family.

## 2018-09-30 ENCOUNTER — Telehealth: Payer: Self-pay | Admitting: *Deleted

## 2018-09-30 ENCOUNTER — Telehealth: Payer: Self-pay | Admitting: Family Medicine

## 2018-09-30 LAB — HGB A1C W/O EAG: HEMOGLOBIN A1C: 10.9 % — AB (ref 4.8–5.6)

## 2018-09-30 LAB — SPECIMEN STATUS REPORT

## 2018-09-30 NOTE — TOC Transition Note (Signed)
Transition of Care [facility]) - CM/SW Discharge Note   Patient Details  Name: Evan Wright MRN: [id number] Date of Birth: 1925/06/24  [facility] CM/SW Contact:  Pollie Friar, RN Phone Number: 09/30/2018, 12:16 PM   Clinical Narrative:    Pt discharged late yesterday with orders for [facility] services. CM called and provided pt choice this am. He selected [facility]. Dorian Pod with [facility] notified and accepted the referral.     Final next level of care: [place] Barriers to Discharge: No Barriers Identified   Patient Goals and CMS Choice Patient states their goals for this hospitalization and ongoing recovery are:: to get back home with wife CMS Medicare.gov Compare Post Acute Care list provided to:: Patient Choice offered to / list presented to : Patient  Discharge Placement                       Discharge Plan and Services Discharge Planning Services: CM Consult Post Acute Care Choice: Home Health              HH Arranged: PT [facility] Agency: [facility]     Social Determinants of Health (SDOH) Interventions     Readmission Risk Interventions No flowsheet data found.

## 2018-09-30 NOTE — Telephone Encounter (Signed)
No answer

## 2018-09-30 NOTE — Telephone Encounter (Signed)
Copied from [place] [phone]. Topic: General - Other >> Sep 30, 2018  1:30 PM Lennox Solders wrote: Reason for CRM: PT daughter cheryl is calling and her dad can not afford linagliptin (tradjenta ) cost is 496.00.  Pt needs another med in same family that is cheaper. [facility]. Pt has an appt with dr Lawson Radar on 10-02-2018

## 2018-10-01 NOTE — Progress Notes (Signed)
Addendum to DC summary from 09/29/2018  Patient was discharged from [facility] on 09/29/2018. On 09/30/2018 I received communication from [place] office that patient's daughter had called and informed that they were unable to afford the Tradjenta (Linaglipitin) prescribed at DC.   I called and discussed with patient's daughter, discussed alternative medication options including risks, benefits and side effects and called in prescription for Glimepiride (Amaryl) 1 mg PO daily [facility]. I specifically advised her that patient should not fill or take the previously ordered Linaglipitin. I advised her that patient should follow up with PCP on appointment date 10/02/2018.  I called and discussed with Merrilee Seashore, the [facility] pharmacist.  I have updated above medication changes on the DC summary.  Vernell Leep, MD, FACP, [facility]. [facility]  To contact the attending provider between 7A-7P or the covering provider during after hours 7P-7A, please log into the web site [email or web address] and access using universal [place] password for that web site. If you do not have the password, please call the hospital operator.

## 2018-10-02 ENCOUNTER — Other Ambulatory Visit: Payer: Self-pay

## 2018-10-02 ENCOUNTER — Encounter: Payer: Self-pay | Admitting: Family Medicine

## 2018-10-02 ENCOUNTER — Ambulatory Visit: Payer: Medicare Other | Admitting: Family Medicine

## 2018-10-02 VITALS — BP 122/64 | HR 66 | Temp 98.7°F | Ht 71.0 in | Wt 167.0 lb

## 2018-10-02 DIAGNOSIS — E039 Hypothyroidism, unspecified: Secondary | ICD-10-CM | POA: Diagnosis not present

## 2018-10-02 DIAGNOSIS — E1165 Type 2 diabetes mellitus with hyperglycemia: Secondary | ICD-10-CM | POA: Diagnosis not present

## 2018-10-02 DIAGNOSIS — S065X9A Traumatic subdural hemorrhage with loss of consciousness of unspecified duration, initial encounter: Secondary | ICD-10-CM | POA: Diagnosis not present

## 2018-10-02 DIAGNOSIS — I255 Ischemic cardiomyopathy: Secondary | ICD-10-CM

## 2018-10-02 DIAGNOSIS — S065XAA Traumatic subdural hemorrhage with loss of consciousness status unknown, initial encounter: Secondary | ICD-10-CM

## 2018-10-02 LAB — CBC WITH DIFFERENTIAL/PLATELET
Basophils Absolute: 0 10*3/uL (ref 0.0–0.1)
Basophils Relative: 0.5 % (ref 0.0–3.0)
Eosinophils Absolute: 0.1 10*3/uL (ref 0.0–0.7)
Eosinophils Relative: 1.2 % (ref 0.0–5.0)
HCT: 41.8 % (ref 39.0–52.0)
HEMOGLOBIN: 14.5 g/dL (ref 13.0–17.0)
Lymphocytes Relative: 19.6 % (ref 12.0–46.0)
Lymphs Abs: 1.3 10*3/uL (ref 0.7–4.0)
MCHC: 34.7 g/dL (ref 30.0–36.0)
MCV: 90.8 fl (ref 78.0–100.0)
Monocytes Absolute: 0.6 10*3/uL (ref 0.1–1.0)
Monocytes Relative: 9 % (ref 3.0–12.0)
Neutro Abs: 4.5 10*3/uL (ref 1.4–7.7)
Neutrophils Relative %: 69.7 % (ref 43.0–77.0)
Platelets: 201 10*3/uL (ref 150.0–400.0)
RBC: 4.6 Mil/uL (ref 4.22–5.81)
RDW: 12.8 % (ref 11.5–15.5)
WBC: 6.5 10*3/uL (ref 4.0–10.5)

## 2018-10-02 LAB — BASIC METABOLIC PANEL
BUN: 26 mg/dL — ABNORMAL HIGH (ref 6–23)
CO2: 25 meq/L (ref 19–32)
Calcium: 9.6 mg/dL (ref 8.4–10.5)
Chloride: 103 mEq/L (ref 96–112)
Creatinine, Ser: 1.08 mg/dL (ref 0.40–1.50)
GFR: 63.93 mL/min (ref >60.00)
Glucose, Bld: 192 mg/dL — ABNORMAL HIGH (ref 70–99)
Potassium: 4.6 mEq/L (ref 3.5–5.1)
Sodium: 138 mEq/L (ref 135–145)

## 2018-10-02 NOTE — Patient Instructions (Signed)
Carbohydrate Counting for Diabetes Mellitus, Adult  Carbohydrate counting is a method of keeping track of how many carbohydrates you eat. Eating carbohydrates naturally increases the amount of sugar (glucose) in the blood. Counting how many carbohydrates you eat helps keep your blood glucose within normal limits, which helps you manage your diabetes (diabetes mellitus). It is important to know how many carbohydrates you can safely have in each meal. This is different for every person. A diet and nutrition specialist (registered dietitian) can help you make a meal plan and calculate how many carbohydrates you should have at each meal and snack. Carbohydrates are found in the following foods:  Grains, such as breads and cereals.  Dried beans and soy products.  Starchy vegetables, such as potatoes, peas, and corn.  Fruit and fruit juices.  Milk and yogurt.  Sweets and snack foods, such as cake, cookies, candy, chips, and soft drinks. How do I count carbohydrates? There are two ways to count carbohydrates in food. You can use either of the methods or a combination of both. Reading "Nutrition Facts" on packaged food The "Nutrition Facts" list is included on the labels of almost all packaged foods and beverages in the [place] It includes:  The serving size.  Information about nutrients in each serving, including the grams (g) of carbohydrate per serving. To use the "Nutrition Facts":  Decide how many servings you will have.  Multiply the number of servings by the number of carbohydrates per serving.  The resulting number is the total amount of carbohydrates that you will be having. Learning standard serving sizes of other foods When you eat carbohydrate foods that are not packaged or do not include "Nutrition Facts" on the label, you need to measure the servings in order to count the amount of carbohydrates:  Measure the foods that you will eat with a food scale or measuring cup, if needed.   Decide how many standard-size servings you will eat.  Multiply the number of servings by 15. Most carbohydrate-rich foods have about 15 g of carbohydrates per serving. ? For example, if you eat 8 oz (170 g) of strawberries, you will have eaten 2 servings and 30 g of carbohydrates (2 servings x 15 g = 30 g).  For foods that have more than one food mixed, such as soups and casseroles, you must count the carbohydrates in each food that is included. The following list contains standard serving sizes of common carbohydrate-rich foods. Each of these servings has about 15 g of carbohydrates:   hamburger bun or  English muffin.   oz (15 mL) syrup.   oz (14 g) jelly.  1 slice of bread.  1 six-inch tortilla.  3 oz (85 g) cooked rice or pasta.  4 oz (113 g) cooked dried beans.  4 oz (113 g) starchy vegetable, such as peas, corn, or potatoes.  4 oz (113 g) hot cereal.  4 oz (113 g) mashed potatoes or  of a large baked potato.  4 oz (113 g) canned or frozen fruit.  4 oz (120 mL) fruit juice.  4-6 crackers.  6 chicken nuggets.  6 oz (170 g) unsweetened dry cereal.  6 oz (170 g) plain fat-free yogurt or yogurt sweetened with artificial sweeteners.  8 oz (240 mL) milk.  8 oz (170 g) fresh fruit or one small piece of fruit.  24 oz (680 g) popped popcorn. Example of carbohydrate counting Sample meal  3 oz (85 g) chicken breast.  6 oz (170 g)   brown rice.  4 oz (113 g) corn.  8 oz (240 mL) milk.  8 oz (170 g) strawberries with sugar-free whipped topping. Carbohydrate calculation 1. Identify the foods that contain carbohydrates: ? Rice. ? Corn. ? Milk. ? Strawberries. 2. Calculate how many servings you have of each food: ? 2 servings rice. ? 1 serving corn. ? 1 serving milk. ? 1 serving strawberries. 3. Multiply each number of servings by 15 g: ? 2 servings rice x 15 g = 30 g. ? 1 serving corn x 15 g = 15 g. ? 1 serving milk x 15 g = 15 g. ? 1 serving  strawberries x 15 g = 15 g. 4. Add together all of the amounts to find the total grams of carbohydrates eaten: ? 30 g + 15 g + 15 g + 15 g = 75 g of carbohydrates total. Summary  Carbohydrate counting is a method of keeping track of how many carbohydrates you eat.  Eating carbohydrates naturally increases the amount of sugar (glucose) in the blood.  Counting how many carbohydrates you eat helps keep your blood glucose within normal limits, which helps you manage your diabetes.  A diet and nutrition specialist (registered dietitian) can help you make a meal plan and calculate how many carbohydrates you should have at each meal and snack. This information is not intended to replace advice given to you by your health care provider. Make sure you discuss any questions you have with your health care provider. Document Released: 07/08/2005 Document Revised: 01/15/2017 Document Reviewed: 12/20/2015 Elsevier Interactive Patient Education  2019 Elsevier Inc.  

## 2018-10-02 NOTE — Progress Notes (Signed)
Patient ID: Evan Wright, male    DOB: 02/21/1925  Age: 85 y.o. MRN: [id number]    Subjective:  Subjective  HPI Evan Wright presents for f/u SDH/ fall -- he was admitted 3/9-3/10   Pt states he had some involuntary movements of his R arm/hand while walking in his house and he fell and hit head.  In the ED CT showed SDH and glucose 530.  hgba1c came back day of admission--10.9 Pt was on insulin in hosp and d/c on metformin and amaryl.  Glucose readings have come down 220-280--- Pt is doing better.   It was felt the involuntary movements were due to the elevated glucose.   Review of Systems  Constitutional: Negative for appetite change, diaphoresis, fatigue and unexpected weight change.  Eyes: Negative for pain, redness and visual disturbance.  Respiratory: Negative for cough, chest tightness, shortness of breath and wheezing.   Cardiovascular: Negative for chest pain, palpitations and leg swelling.  Endocrine: Negative for cold intolerance, heat intolerance, polydipsia, polyphagia and polyuria.  Genitourinary: Negative for difficulty urinating, dysuria and frequency.  Musculoskeletal: Positive for gait problem.  Neurological: Positive for weakness. Negative for dizziness, light-headedness, numbness and headaches.  Psychiatric/Behavioral: The patient is not nervous/anxious.     History Past Medical History:  Diagnosis Date   Adenomatous colon polyp    Angina pectoris ([place]) 04/2017   CAD (coronary artery disease)    a. Cath 04/29/17-> high grade pLAD with R to L collaterals, high grade 1st dig  2. Cath 05/01/17 s/p ostial to mLAD with DES x 2.    Cardiac murmur    Coronary artery disease    Diverticulosis    GERD (gastroesophageal reflux disease)    History of hiatal hernia    Hyperlipidemia    Loss of height 06/11/2016   Osteoarthritis    Prostate cancer [facility])     He has a past surgical history that includes Prostatectomy; Tonsillectomy; Eye surgery (2012);  Appendectomy (1948); Colonoscopy; LEFT HEART CATH AND CORONARY ANGIOGRAPHY (N/A, 04/29/2017); and CORONARY STENT INTERVENTION (N/A, 05/01/2017).   His family history includes Colitis in his mother; Colon cancer in his brother; Lung cancer in his father; Prostate cancer in his father.He reports that he has never smoked. He has never used smokeless tobacco. He reports that he does not drink alcohol or use drugs.  Current Outpatient Medications on File Prior to Visit  Medication Sig Dispense Refill   [START ON 10/06/2018] aspirin EC 81 MG tablet Take 1 tablet (81 mg total) by mouth daily at 12 noon.     atorvastatin (LIPITOR) 80 MG tablet Take 1 tablet (80 mg total) by mouth daily. 90 tablet 1   bismuth subsalicylate (PEPTO BISMOL) 262 MG/15ML suspension Take 30 mLs by mouth every 6 (six) hours as needed for indigestion or diarrhea or loose stools.     blood glucose meter kit and supplies KIT Dispense based on patient and insurance preference. Use up to four times daily as directed. (FOR ICD-9 250.00, 250.01). 1 each 0   Calcium Carb-Cholecalciferol (CALCIUM+D3 PO) Take 1 tablet by mouth daily at 12 noon.     carvedilol (COREG) 3.125 MG tablet TAKE ONE TABLET BY MOUTH TWICE DAILY (Patient taking differently: Take 3.125 mg by mouth 2 (two) times daily with a meal. ) 180 tablet 1   Docusate Calcium (STOOL SOFTENER PO) Take 1-2 capsules by mouth at bedtime.     glimepiride (AMARYL) 1 MG tablet Take 1 mg by mouth daily with breakfast.  isosorbide mononitrate (IMDUR) 60 MG 24 hr tablet Take 30 mg by mouth daily after breakfast.     lansoprazole (PREVACID) 30 MG capsule TAKE ONE CAPSULE BY MOUTH EVERY DAY AT 12 NOON WITH FOOD (Patient taking differently: Take 30 mg by mouth daily before breakfast. Open capsule and sprinkle on applesauce) 90 capsule 1   levothyroxine (SYNTHROID, LEVOTHROID) 50 MCG tablet TAKE ONE TABLET BY MOUTH DAILY (Patient taking differently: Take 50 mcg by mouth daily before  breakfast. ) 90 tablet 1   MAGNESIUM PO Take 1 tablet by mouth daily at 12 noon.     metFORMIN (GLUCOPHAGE) 500 MG tablet Take 1 tablet (500 mg total) by mouth 2 (two) times daily with a meal. 60 tablet 0   Multiple Vitamins-Minerals (PRESERVISION AREDS 2) CAPS Take 1 capsule by mouth daily at 12 noon.     nitroGLYCERIN (NITROSTAT) 0.4 MG SL tablet Place 1 tablet (0.4 mg total) under the tongue every 5 (five) minutes as needed for chest pain. 25 tablet 11   Polyethyl Glycol-Propyl Glycol (SYSTANE OP) Place 1 drop into both eyes daily as needed (dry eyes).     Psyllium (METAMUCIL PO) Take 1 Dose by mouth every 3 (three) days.      No current facility-administered medications on file prior to visit.      Objective:  Objective  Physical Exam Nursing note reviewed. Exam conducted with a chaperone present.  Constitutional:      General: He is sleeping.     Appearance: He is well-developed.  HENT:     Head: Normocephalic and atraumatic.  Eyes:     Pupils: Pupils are equal, round, and reactive to light.  Neck:     Musculoskeletal: Normal range of motion and neck supple.     Thyroid: No thyromegaly.  Cardiovascular:     Rate and Rhythm: Normal rate and regular rhythm.     Heart sounds: No murmur.  Pulmonary:     Effort: Pulmonary effort is normal. No respiratory distress.     Breath sounds: Normal breath sounds. No wheezing or rales.  Chest:     Chest wall: No tenderness.  Musculoskeletal:        General: No tenderness.  Skin:    General: Skin is warm and dry.     Findings: Bruising present.       Neurological:     Mental Status: He is oriented to person, place, and time.  Psychiatric:        Behavior: Behavior normal.        Thought Content: Thought content normal.        Judgment: Judgment normal.    BP 122/64    Pulse 66    Temp 98.7 F (37.1 C)    Ht 5' 11"  (1.803 m)    Wt 167 lb (75.8 kg)    SpO2 99%    BMI 23.29 kg/m  Wt Readings from Last 3 Encounters:    10/02/18 167 lb (75.8 kg)  09/29/18 168 lb 10.4 oz (76.5 kg)  09/22/18 170 lb (77.1 kg)     Lab Results  Component Value Date   WBC 6.5 10/02/2018   HGB 14.5 10/02/2018   HCT 41.8 10/02/2018   PLT 201.0 10/02/2018   GLUCOSE 192 (H) 10/02/2018   CHOL 147 09/22/2018   TRIG 160 (H) 09/22/2018   HDL 41 09/22/2018   LDLDIRECT 165.3 10/08/2010   LDLCALC 74 09/22/2018   ALT 27 09/28/2018   AST 20 09/28/2018  NA 138 10/02/2018   K 4.6 10/02/2018   CL 103 10/02/2018   CREATININE 1.08 10/02/2018   BUN 26 (H) 10/02/2018   CO2 25 10/02/2018   TSH 1.761 09/28/2018   PSA 0.01 (L) 06/11/2016   INR 1.0 09/28/2018   HGBA1C 10.9 (H) 09/22/2018   MICROALBUR 7.0 (H) 09/22/2018    Dg Chest 2 View  Result Date: 09/28/2018 CLINICAL DATA:  Status post fall. EXAM: CHEST - 2 VIEW COMPARISON:  02/28/2018 FINDINGS: Cardiomediastinal silhouette is normal. Mediastinal contours appear intact. Calcific atherosclerotic disease of the aorta. There is no evidence of focal airspace consolidation, pleural effusion or pneumothorax. Osseous structures are without acute abnormality. Soft tissues are grossly normal. IMPRESSION: No active cardiopulmonary disease. Electronically Signed   By: Fidela Salisbury M.D.   On: 09/28/2018 12:01   Dg Elbow Complete Right  Result Date: 09/28/2018 CLINICAL DATA:  Status post fall with right elbow pain. EXAM: RIGHT ELBOW - COMPLETE 3+ VIEW COMPARISON:  None. FINDINGS: There is no evidence of fracture, dislocation, or joint effusion. Soft tissue swelling/hematoma anterior to the radial head. IMPRESSION: No acute fracture or dislocation identified about the right elbow. Soft tissue swelling/hematoma anterior to the radial head. Electronically Signed   By: Fidela Salisbury M.D.   On: 09/28/2018 12:04   Ct Head Wo Contrast  Result Date: 09/28/2018 CLINICAL DATA:  Fall and head trauma.  Arm weakness. EXAM: CT HEAD WITHOUT CONTRAST CT CERVICAL SPINE WITHOUT CONTRAST TECHNIQUE:  Multidetector CT imaging of the head and cervical spine was performed following the standard protocol without intravenous contrast. Multiplanar CT image reconstructions of the cervical spine were also generated. COMPARISON:  None. FINDINGS: CT HEAD FINDINGS Brain: Small amount of extra-axial blood along the left convexity near the vertex. Findings are suggestive for a small subdural hematoma. Blood is located in the left anterior parietal region. No other areas are concerning for intracranial hemorrhage. No evidence for large infarct, mass lesion, midline shift or hydrocephalus. Mild cerebral atrophy. Small amount of low-density in the periventricular white matter. Vascular: No hyperdense vessel or unexpected calcification. Skull: Right frontal scalp hematoma without underlying fracture. No acute bone abnormality. Sinuses/Orbits: Visualized paranasal sinuses are clear. Other: None CT CERVICAL SPINE FINDINGS Alignment: Curvature of the lower cervical spine towards the right side. Skull base and vertebrae: No acute fracture. No primary bone lesion or focal pathologic process. Soft tissues and spinal canal: No prevertebral fluid or swelling. No visible canal hematoma. Disc levels: Multilevel disc space narrowing and endplate disease. Prominent facet disease on the left side at C3-C4. Upper chest: Lung apices are clear.  Negative for pneumothorax. Other: None IMPRESSION: 1. Small left subdural hematoma. No evidence for mass effect or midline shift. 2. Right frontal scalp hematoma.  No calvarial fracture. 3. Mild cerebral atrophy. Evidence for chronic small vessel ischemic changes. 4. Degenerative changes in the cervical spine without acute bone abnormality. Critical Value/emergent results were called by telephone at the time of interpretation on 09/28/2018 at 12:05 pm to Dr. Nanda Quinton , who verbally acknowledged these results. Electronically Signed   By: Markus Daft M.D.   On: 09/28/2018 12:08   Ct Cervical Spine Wo  Contrast  Result Date: 09/28/2018 CLINICAL DATA:  Fall and head trauma.  Arm weakness. EXAM: CT HEAD WITHOUT CONTRAST CT CERVICAL SPINE WITHOUT CONTRAST TECHNIQUE: Multidetector CT imaging of the head and cervical spine was performed following the standard protocol without intravenous contrast. Multiplanar CT image reconstructions of the cervical spine were also generated. COMPARISON:  None. FINDINGS: CT HEAD FINDINGS Brain: Small amount of extra-axial blood along the left convexity near the vertex. Findings are suggestive for a small subdural hematoma. Blood is located in the left anterior parietal region. No other areas are concerning for intracranial hemorrhage. No evidence for large infarct, mass lesion, midline shift or hydrocephalus. Mild cerebral atrophy. Small amount of low-density in the periventricular white matter. Vascular: No hyperdense vessel or unexpected calcification. Skull: Right frontal scalp hematoma without underlying fracture. No acute bone abnormality. Sinuses/Orbits: Visualized paranasal sinuses are clear. Other: None CT CERVICAL SPINE FINDINGS Alignment: Curvature of the lower cervical spine towards the right side. Skull base and vertebrae: No acute fracture. No primary bone lesion or focal pathologic process. Soft tissues and spinal canal: No prevertebral fluid or swelling. No visible canal hematoma. Disc levels: Multilevel disc space narrowing and endplate disease. Prominent facet disease on the left side at C3-C4. Upper chest: Lung apices are clear.  Negative for pneumothorax. Other: None IMPRESSION: 1. Small left subdural hematoma. No evidence for mass effect or midline shift. 2. Right frontal scalp hematoma.  No calvarial fracture. 3. Mild cerebral atrophy. Evidence for chronic small vessel ischemic changes. 4. Degenerative changes in the cervical spine without acute bone abnormality. Critical Value/emergent results were called by telephone at the time of interpretation on 09/28/2018 at  12:05 pm to Dr. Nanda Quinton , who verbally acknowledged these results. Electronically Signed   By: Markus Daft M.D.   On: 09/28/2018 12:08     Assessment & Plan:  Plan  I have discontinued Zaion W. Gomm's linagliptin. I am also having him maintain his Psyllium (METAMUCIL PO), nitroGLYCERIN, atorvastatin, lansoprazole, carvedilol, levothyroxine, PreserVision AREDS 2, isosorbide mononitrate, Calcium Carb-Cholecalciferol (CALCIUM+D3 PO), MAGNESIUM PO, Polyethyl Glycol-Propyl Glycol (SYSTANE OP), bismuth subsalicylate, Docusate Calcium (STOOL SOFTENER PO), aspirin EC, blood glucose meter kit and supplies, metFORMIN, and glimepiride.  No orders of the defined types were placed in this encounter.   Problem List Items Addressed This Visit      High   Hypothyroidism    Stable Lab Results  Component Value Date   TSH 1.761 09/28/2018   con't synthroid      Uncontrolled diabetes mellitus with hyperglycemia (HCC)    con't metformin and amaryl Endo referral pending  con't to check glucose regularly  Pt daughters and wife are present and in agreement with plan       Relevant Medications   glimepiride (AMARYL) 1 MG tablet   Other Relevant Orders   Ambulatory referral to Endocrinology     Unprioritized   Ischemic cardiomyopathy    F/u cardiology      SDH (subdural hematoma) (HCC) - Primary    Hematoma healing Repeat ct in 1 month      Relevant Orders   CT Head Wo Contrast   CBC with Differential/Platelet (Completed)   Basic metabolic panel (Completed)      Follow-up: Return in about 3 months (around 01/02/2019), or if symptoms worsen or fail to improve, for hypertension, hyperlipidemia, diabetes II.  [name], DO

## 2018-10-02 NOTE — Telephone Encounter (Signed)
Appt today

## 2018-10-03 NOTE — Assessment & Plan Note (Addendum)
con't prevacid

## 2018-10-03 NOTE — Assessment & Plan Note (Signed)
con't metformin and amaryl Endo referral pending  con't to check glucose regularly  Pt daughters and wife are present and in agreement with plan

## 2018-10-03 NOTE — Assessment & Plan Note (Addendum)
Stable Lab Results  Component Value Date   TSH 1.761 09/28/2018   con't synthroid

## 2018-10-03 NOTE — Assessment & Plan Note (Signed)
F/u cardiology 

## 2018-10-03 NOTE — Assessment & Plan Note (Signed)
Hematoma healing Repeat ct in 1 month

## 2018-10-06 ENCOUNTER — Telehealth: Payer: Self-pay | Admitting: *Deleted

## 2018-10-06 NOTE — Telephone Encounter (Signed)
Pt's daughter states they were contacted yesterday about scheduling pt's follow up CT of the head. She would like to wait 3 weeks to see if the Coronavirus threat lessens. She does not want to take pt out any more than necessary right now. I advised her if she did not hear back from [place] it is ok to wait. If PCP wants pt to proceed with CT at this time we will call her back.  Please advise?

## 2018-10-06 NOTE — Telephone Encounter (Signed)
It's ok to wait

## 2018-10-06 NOTE — Telephone Encounter (Signed)
Patient daughter advised to wait.  She will reschedule.

## 2018-10-12 ENCOUNTER — Telehealth: Payer: Self-pay | Admitting: *Deleted

## 2018-10-12 NOTE — Telephone Encounter (Signed)
Received Physician Orders from [place]; forwarded to provider/SLS 03/23

## 2018-10-15 ENCOUNTER — Telehealth: Payer: Self-pay | Admitting: *Deleted

## 2018-10-15 NOTE — Telephone Encounter (Signed)
Copied from [place] [phone]. Topic: General - Inquiry >> Oct 15, 2018  8:53 AM Ahmed Prima L wrote: Reason for CRM: Patient's daughter, Malachy Mood said he is checking blood sugar 4 times a day - he is doing okay. She wants to know can he just check it 3 times a day since his numbers are doing good. They stay in the 100's. Yesterday one of the readings were 80. She would like to know should he drop one of his medications? She would like to speak with the nurse or Dr Carollee Herter

## 2018-10-15 NOTE — Telephone Encounter (Signed)
Stop the amaryl

## 2018-10-15 NOTE — Telephone Encounter (Signed)
Patient notified that due to covid 19 appts may be delayed.

## 2018-10-15 NOTE — Telephone Encounter (Signed)
Copied from [place] [phone]. Topic: Referral - Question >> Oct 06, 2018  4:15 PM Vernona Rieger wrote: Reason for CRM: Dewaine Oats with [facility] called and said she received a referral that she can see through proficient for him, but sees nothing else to why he needs to be seen. Should this have been sent to Endocrinology instead with Dr Louanna Raw Endocrinology? >> Oct 15, 2018  8:57 AM Vernona Rieger wrote: Patient's daughter Malachy Mood called and still has not from the endocrinologist

## 2018-10-16 NOTE — Telephone Encounter (Signed)
Patient daughter notified he can discontinue amaryl and can cut back checking to tid.

## 2018-10-19 ENCOUNTER — Telehealth: Payer: Self-pay

## 2018-10-19 NOTE — Telephone Encounter (Signed)
I Reviewed his chart and noted that he had a stent more than a year ago.  Please confirm this.  If this is the case then he does not need Plavix.

## 2018-10-19 NOTE — Telephone Encounter (Signed)
Patient states he had stents placed a little over 1 year ago, Dr.RRR recommended that patient discontinue plavix. No further questions at this time.

## 2018-10-19 NOTE — Telephone Encounter (Signed)
Patient called because patient was seen at hospital because of a fall and when he was there as they ran test they found bleeding in his brain and took him off plavix and patient wanted to know should he go back on it please advise.

## 2018-10-26 ENCOUNTER — Other Ambulatory Visit: Payer: Self-pay | Admitting: Cardiology

## 2018-11-25 ENCOUNTER — Other Ambulatory Visit: Payer: Self-pay | Admitting: Family Medicine

## 2018-11-30 ENCOUNTER — Other Ambulatory Visit: Payer: Self-pay | Admitting: Family Medicine

## 2018-11-30 MED ORDER — METFORMIN HCL 500 MG PO TABS: 500.0000 mg | ORAL_TABLET | Freq: Two times a day (BID) | ORAL | 5 refills | Status: DC

## 2018-11-30 NOTE — Telephone Encounter (Signed)
Copied from [place] [phone]. Topic: Quick Communication - Rx Refill/Question >> Nov 30, 2018 10:16 AM Celene Kras A wrote: Medication: metFORMIN (GLUCOPHAGE) 500 MG tablet  Has the patient contacted their pharmacy? Yes.  Pts daughter called about mediaction stating the pharmacy would not refill it because it was not under PCP. Daughter states pt only has enough medication for three more days. Please advise (Agent: If no, request that the patient contact the pharmacy for the refill.) (Agent: If yes, when and what did the pharmacy advise?)  Preferred Pharmacy (with phone number or street name): [place], [place] - [address] [place] [address] [place] [address] Phone: [phone] Fax: [phone] Not a 24 hour pharmacy; exact hours not known.    Agent: Please be advised that RX refills may take up to 3 business days. We ask that you follow-up with your pharmacy.

## 2018-11-30 NOTE — Telephone Encounter (Signed)
Refills sent

## 2018-12-28 ENCOUNTER — Telehealth: Payer: Self-pay

## 2018-12-28 NOTE — Telephone Encounter (Signed)
Copied from [place] [phone]. Topic: General - Other >> Dec 28, 2018  9:10 AM Rainey Pines A wrote: Tj from Dr. Kathi Ludwig office called to notify Dr. Carollee Herter that Dr. Buddy Duty is not accepting new diabetes patients at this time.

## 2018-12-28 NOTE — Telephone Encounter (Signed)
Let pt know---  Refer to [place]

## 2018-12-29 ENCOUNTER — Other Ambulatory Visit: Payer: Self-pay

## 2018-12-29 DIAGNOSIS — E1165 Type 2 diabetes mellitus with hyperglycemia: Secondary | ICD-10-CM

## 2018-12-29 NOTE — Telephone Encounter (Signed)
New referral sent.

## 2018-12-31 ENCOUNTER — Telehealth: Payer: Self-pay | Admitting: *Deleted

## 2018-12-31 NOTE — Telephone Encounter (Signed)
Spoke with Malachy Mood (daughter), advised her that appt will be Virtual. Explained virtual process and she states she will be with pt for the visit. Appt notes updated.  Copied from [place] [phone]. Topic: General - Other >> Dec 30, 2018 10:47 AM Celene Kras A wrote: Reason for CRM: Pts daughter called requesting to know if appt on 01/05/2019 was necessary. Pts daughter states if not necessary she would like to push it back and if necessary pts daughter would like to change visit to a virtual visit. Please advise.

## 2019-01-05 ENCOUNTER — Other Ambulatory Visit: Payer: Self-pay

## 2019-01-05 ENCOUNTER — Ambulatory Visit (INDEPENDENT_AMBULATORY_CARE_PROVIDER_SITE_OTHER): Payer: Medicare Other | Admitting: Family Medicine

## 2019-01-05 ENCOUNTER — Encounter: Payer: Self-pay | Admitting: Family Medicine

## 2019-01-05 DIAGNOSIS — E785 Hyperlipidemia, unspecified: Secondary | ICD-10-CM

## 2019-01-05 DIAGNOSIS — I10 Essential (primary) hypertension: Secondary | ICD-10-CM

## 2019-01-05 DIAGNOSIS — E1169 Type 2 diabetes mellitus with other specified complication: Secondary | ICD-10-CM | POA: Diagnosis not present

## 2019-01-05 DIAGNOSIS — E1165 Type 2 diabetes mellitus with hyperglycemia: Secondary | ICD-10-CM

## 2019-01-05 DIAGNOSIS — R5383 Other fatigue: Secondary | ICD-10-CM | POA: Diagnosis not present

## 2019-01-05 NOTE — Progress Notes (Signed)
Virtual Visit via Video Note  I connected with Evan Wright on 01/05/19 at 11:00 AM EDT by a video enabled telemedicine application and verified that I am speaking with the correct person using two identifiers.  Location: Patient: home with daughter   Pt wife is in the room too Provider: office    I discussed the limitations of evaluation and management by telemedicine and the availability of in person appointments. The patient expressed understanding and agreed to proceed.  History of Present Illness:    Observations/Objective: 139/65  67    Assessment and Plan: 1. Hyperlipidemia associated with type 2 diabetes mellitus ([place]) Tolerating statin, encouraged heart healthy diet, avoid trans fats, minimize simple carbs and saturated fats. Increase exercise as tolerated - Lipid panel; Future - Comprehensive metabolic panel; Future - Microalbumin / creatinine urine ratio; Future  2. Type 2 diabetes mellitus with hyperglycemia, unspecified whether long term insulin use ([place]) Check labs  con't meds  - Lipid panel; Future - Hemoglobin A1c; Future - Comprehensive metabolic panel; Future - Microalbumin / creatinine urine ratio; Future  3. Essential hypertension Well controlled, no changes to meds. Encouraged heart healthy diet such as the DASH diet and exercise as tolerated.   - Comprehensive metabolic panel; Future - Microalbumin / creatinine urine ratio; Future  4. Fatigue, unspecified type Check labs Probably cardiac etiology but will check labs  - TSH; Future - Vitamin B12; Future   Follow Up Instructions:    I discussed the assessment and treatment plan with the patient. The patient was provided an opportunity to ask questions and all were answered. The patient agreed with the plan and demonstrated an understanding of the instructions.   The patient was advised to call back or seek an in-person evaluation if the symptoms worsen or if the condition fails to improve as  anticipated.  I provided 25 minutes of non-face-to-face time during this encounter.   [name], DO

## 2019-01-06 ENCOUNTER — Telehealth: Payer: Self-pay | Admitting: Family Medicine

## 2019-01-06 NOTE — Telephone Encounter (Signed)
Schedule pt and wife on January 14, 2019 for lab appt. Done

## 2019-01-06 NOTE — Telephone Encounter (Signed)
-----   Message from [name], DO sent at 01/05/2019 11:44 AM EDT ----- Mr and mrs Augenstein need a lab appointment

## 2019-01-13 ENCOUNTER — Ambulatory Visit: Payer: Self-pay

## 2019-01-13 NOTE — Telephone Encounter (Signed)
Incoming call from Pt.  Daughter.  State that she removed a tick off of Pt.  Back .  Appeared dead.   Believes that she got the head out. It was the size of a pencil eraser.   Has been putting Neosporin on site.    Denies fever Patient has a lab appt. At 1045 tomorrow.  Wants to know if He could be evaluated for the tick bite while there.  Daughter would like a return Call in the morning when office opens up please.    Reason for Disposition . Tick bite with no complications  Answer Assessment - Initial Assessment Questions 1. TYPE of TICK: "Is it a wood tick or a deer tick?" If unsure, ask: "What size was the tick?" "Did it look more like a watermelon seed or a poppy seed?"      Pencil eraser.   Was totally flat 2. LOCATION: "Where is the tick bite located?"      *back 3. ONSET: "How long do you think the tick was attached before you removed it?" (Hours or days)       4. TETANUS: "When was the last tetanus booster?"      unaware 5. PREGNANCY: "Is there any chance you are pregnant?" "When was your last menstrual period?na  Protocols used: TICK BITE-A-AH

## 2019-01-14 ENCOUNTER — Other Ambulatory Visit (INDEPENDENT_AMBULATORY_CARE_PROVIDER_SITE_OTHER): Payer: Medicare Other

## 2019-01-14 ENCOUNTER — Other Ambulatory Visit: Payer: Self-pay

## 2019-01-14 DIAGNOSIS — E1169 Type 2 diabetes mellitus with other specified complication: Secondary | ICD-10-CM | POA: Diagnosis not present

## 2019-01-14 DIAGNOSIS — I10 Essential (primary) hypertension: Secondary | ICD-10-CM | POA: Diagnosis not present

## 2019-01-14 DIAGNOSIS — R5383 Other fatigue: Secondary | ICD-10-CM | POA: Diagnosis not present

## 2019-01-14 DIAGNOSIS — E1165 Type 2 diabetes mellitus with hyperglycemia: Secondary | ICD-10-CM

## 2019-01-14 DIAGNOSIS — E785 Hyperlipidemia, unspecified: Secondary | ICD-10-CM

## 2019-01-14 LAB — LIPID PANEL
Cholesterol: 116 mg/dL (ref 0–200)
HDL: 42.2 mg/dL (ref >39.00)
LDL Cholesterol: 52 mg/dL (ref 0–99)
NonHDL: 73.64
Total CHOL/HDL Ratio: 3
Triglycerides: 110 mg/dL (ref 0.0–149.0)
VLDL: 22 mg/dL (ref 0.0–40.0)

## 2019-01-14 LAB — COMPREHENSIVE METABOLIC PANEL
ALT: 19 U/L (ref 0–53)
AST: 17 U/L (ref 0–37)
Albumin: 4.5 g/dL (ref 3.5–5.2)
Alkaline Phosphatase: 51 U/L (ref 39–117)
BUN: 17 mg/dL (ref 6–23)
CO2: 27 mEq/L (ref 19–32)
Calcium: 9.7 mg/dL (ref 8.4–10.5)
Chloride: 106 mEq/L (ref 96–112)
Creatinine, Ser: 0.85 mg/dL (ref 0.40–1.50)
GFR: 84.23 mL/min (ref >60.00)
Glucose, Bld: 116 mg/dL — ABNORMAL HIGH (ref 70–99)
Potassium: 4.3 mEq/L (ref 3.5–5.1)
Sodium: 143 mEq/L (ref 135–145)
Total Bilirubin: 0.8 mg/dL (ref 0.2–1.2)
Total Protein: 6.9 g/dL (ref 6.0–8.3)

## 2019-01-14 LAB — TSH: TSH: 1.72 u[IU]/mL (ref 0.35–4.50)

## 2019-01-14 LAB — MICROALBUMIN / CREATININE URINE RATIO
Creatinine,U: 75.5 mg/dL
Microalb Creat Ratio: 8.4 mg/g (ref 0.0–30.0)
Microalb, Ur: 6.3 mg/dL — ABNORMAL HIGH (ref 0.0–1.9)

## 2019-01-14 LAB — HEMOGLOBIN A1C: Hgb A1c MFr Bld: 6.6 % — ABNORMAL HIGH (ref 4.6–6.5)

## 2019-01-14 LAB — VITAMIN B12: Vitamin B-12: 243 pg/mL (ref 211–911)

## 2019-01-14 NOTE — Telephone Encounter (Signed)
Needs visit

## 2019-01-15 ENCOUNTER — Encounter: Payer: Self-pay | Admitting: Family Medicine

## 2019-01-15 ENCOUNTER — Ambulatory Visit (INDEPENDENT_AMBULATORY_CARE_PROVIDER_SITE_OTHER): Payer: Medicare Other | Admitting: Family Medicine

## 2019-01-15 VITALS — BP 162/68 | HR 66 | Temp 98.0°F | Resp 16 | Ht 71.0 in | Wt 164.2 lb

## 2019-01-15 DIAGNOSIS — S30860A Insect bite (nonvenomous) of lower back and pelvis, initial encounter: Secondary | ICD-10-CM

## 2019-01-15 DIAGNOSIS — W57XXXA Bitten or stung by nonvenomous insect and other nonvenomous arthropods, initial encounter: Secondary | ICD-10-CM | POA: Diagnosis not present

## 2019-01-15 MED ORDER — DOXYCYCLINE HYCLATE 100 MG PO TABS: 100.0000 mg | ORAL_TABLET | Freq: Two times a day (BID) | ORAL | 0 refills | Status: DC

## 2019-01-15 NOTE — Patient Instructions (Signed)

## 2019-01-15 NOTE — Progress Notes (Signed)
Patient ID: Evan Wright, male    DOB: 1925/06/11  Age: 85 y.o. MRN: [id number]    Subjective:  Subjective  HPI Evan Wright presents for tick bite on his back .  His wife thought it was a mole but when his daughter got a closer look it was a tick -- - it was on him for 2-3 weeks   They removed it yesterday No other symptoms   Review of Systems  Constitutional: Negative for appetite change, diaphoresis, fatigue and unexpected weight change.  Eyes: Negative for pain, redness and visual disturbance.  Respiratory: Negative for cough, chest tightness, shortness of breath and wheezing.   Cardiovascular: Negative for chest pain, palpitations and leg swelling.  Endocrine: Negative for cold intolerance, heat intolerance, polydipsia, polyphagia and polyuria.  Genitourinary: Negative for difficulty urinating, dysuria and frequency.  Skin: Positive for wound.  Neurological: Negative for dizziness, light-headedness, numbness and headaches.    History Past Medical History:  Diagnosis Date   Adenomatous colon polyp    Angina pectoris ([place]) 04/2017   CAD (coronary artery disease)    a. Cath 04/29/17-> high grade pLAD with R to L collaterals, high grade 1st dig  2. Cath 05/01/17 s/p ostial to mLAD with DES x 2.    Cardiac murmur    Coronary artery disease    Diverticulosis    GERD (gastroesophageal reflux disease)    History of hiatal hernia    Hyperlipidemia    Loss of height 06/11/2016   Osteoarthritis    Prostate cancer [facility])     He has a past surgical history that includes Prostatectomy; Tonsillectomy; Eye surgery (2012); Appendectomy (1948); Colonoscopy; LEFT HEART CATH AND CORONARY ANGIOGRAPHY (N/A, 04/29/2017); and CORONARY STENT INTERVENTION (N/A, 05/01/2017).   His family history includes Colitis in his mother; Colon cancer in his brother; Lung cancer in his father; Prostate cancer in his father.He reports that he has never smoked. He has never used smokeless tobacco. He  reports that he does not drink alcohol or use drugs.  Current Outpatient Medications on File Prior to Visit  Medication Sig Dispense Refill   aspirin EC 81 MG tablet Take 1 tablet (81 mg total) by mouth daily at 12 noon.     atorvastatin (LIPITOR) 80 MG tablet TAKE ONE TABLET BY MOUTH DAILY 90 tablet 1   bismuth subsalicylate (PEPTO BISMOL) 262 MG/15ML suspension Take 30 mLs by mouth every 6 (six) hours as needed for indigestion or diarrhea or loose stools.     blood glucose meter kit and supplies KIT Dispense based on patient and insurance preference. Use up to four times daily as directed. (FOR ICD-9 250.00, 250.01). 1 each 0   Calcium Carb-Cholecalciferol (CALCIUM+D3 PO) Take 1 tablet by mouth daily at 12 noon.     carvedilol (COREG) 3.125 MG tablet TAKE ONE TABLET BY MOUTH TWICE DAILY 180 tablet 1   Docusate Calcium (STOOL SOFTENER PO) Take 1-2 capsules by mouth at bedtime.     isosorbide mononitrate (IMDUR) 60 MG 24 hr tablet Take 30 mg by mouth daily after breakfast.     lansoprazole (PREVACID) 30 MG capsule TAKE ONE CAPSULE BY MOUTH EVERY DAY AT 12 NOON WITH FOOD (Patient taking differently: Take 30 mg by mouth daily before breakfast. Open capsule and sprinkle on applesauce) 90 capsule 1   levothyroxine (SYNTHROID, LEVOTHROID) 50 MCG tablet TAKE ONE TABLET BY MOUTH DAILY (Patient taking differently: Take 50 mcg by mouth daily before breakfast. ) 90 tablet 1   MAGNESIUM  PO Take 1 tablet by mouth daily at 12 noon.     metFORMIN (GLUCOPHAGE) 500 MG tablet Take 1 tablet (500 mg total) by mouth 2 (two) times daily with a meal. 60 tablet 5   Multiple Vitamins-Minerals (PRESERVISION AREDS 2) CAPS Take 1 capsule by mouth daily at 12 noon.     nitroGLYCERIN (NITROSTAT) 0.4 MG SL tablet Place 1 tablet (0.4 mg total) under the tongue every 5 (five) minutes as needed for chest pain. 25 tablet 11   Polyethyl Glycol-Propyl Glycol (SYSTANE OP) Place 1 drop into both eyes daily as needed (dry  eyes).     Psyllium (METAMUCIL PO) Take 1 Dose by mouth every 3 (three) days.      No current facility-administered medications on file prior to visit.      Objective:  Objective  Physical Exam Vitals signs and nursing note reviewed.  Constitutional:      General: He is sleeping.     Appearance: He is well-developed.  HENT:     Head: Normocephalic and atraumatic.  Eyes:     Pupils: Pupils are equal, round, and reactive to light.  Neck:     Musculoskeletal: Normal range of motion and neck supple.     Thyroid: No thyromegaly.  Cardiovascular:     Rate and Rhythm: Normal rate and regular rhythm.     Heart sounds: No murmur.  Pulmonary:     Effort: Pulmonary effort is normal. No respiratory distress.     Breath sounds: Normal breath sounds. No wheezing or rales.  Chest:     Chest wall: No tenderness.  Musculoskeletal:        General: No tenderness.  Skin:    General: Skin is warm and dry.       Neurological:     Mental Status: He is oriented to person, place, and time.  Psychiatric:        Behavior: Behavior normal.        Thought Content: Thought content normal.        Judgment: Judgment normal.    BP (!) 162/68    Pulse 66    Temp 98 F (36.7 C) (Oral)    Resp 16    Ht 5' 11"  (1.803 m)    Wt 164 lb 3.2 oz (74.5 kg)    SpO2 100%    BMI 22.90 kg/m  Wt Readings from Last 3 Encounters:  01/15/19 164 lb 3.2 oz (74.5 kg)  10/02/18 167 lb (75.8 kg)  09/29/18 168 lb 10.4 oz (76.5 kg)     Lab Results  Component Value Date   WBC 6.5 10/02/2018   HGB 14.5 10/02/2018   HCT 41.8 10/02/2018   PLT 201.0 10/02/2018   GLUCOSE 116 (H) 01/14/2019   CHOL 116 01/14/2019   TRIG 110.0 01/14/2019   HDL 42.20 01/14/2019   LDLDIRECT 165.3 10/08/2010   LDLCALC 52 01/14/2019   ALT 19 01/14/2019   AST 17 01/14/2019   NA 143 01/14/2019   K 4.3 01/14/2019   CL 106 01/14/2019   CREATININE 0.85 01/14/2019   BUN 17 01/14/2019   CO2 27 01/14/2019   TSH 1.72 01/14/2019   PSA 0.01  (L) 06/11/2016   INR 1.0 09/28/2018   HGBA1C 6.6 (H) 01/14/2019   MICROALBUR 6.3 (H) 01/14/2019    Dg Chest 2 View  Result Date: 09/28/2018 CLINICAL DATA:  Status post fall. EXAM: CHEST - 2 VIEW COMPARISON:  02/28/2018 FINDINGS: Cardiomediastinal silhouette is normal. Mediastinal contours appear intact.  Calcific atherosclerotic disease of the aorta. There is no evidence of focal airspace consolidation, pleural effusion or pneumothorax. Osseous structures are without acute abnormality. Soft tissues are grossly normal. IMPRESSION: No active cardiopulmonary disease. Electronically Signed   By: Fidela Salisbury M.D.   On: 09/28/2018 12:01   Dg Elbow Complete Right  Result Date: 09/28/2018 CLINICAL DATA:  Status post fall with right elbow pain. EXAM: RIGHT ELBOW - COMPLETE 3+ VIEW COMPARISON:  None. FINDINGS: There is no evidence of fracture, dislocation, or joint effusion. Soft tissue swelling/hematoma anterior to the radial head. IMPRESSION: No acute fracture or dislocation identified about the right elbow. Soft tissue swelling/hematoma anterior to the radial head. Electronically Signed   By: Fidela Salisbury M.D.   On: 09/28/2018 12:04   Ct Head Wo Contrast  Result Date: 09/28/2018 CLINICAL DATA:  Fall and head trauma.  Arm weakness. EXAM: CT HEAD WITHOUT CONTRAST CT CERVICAL SPINE WITHOUT CONTRAST TECHNIQUE: Multidetector CT imaging of the head and cervical spine was performed following the standard protocol without intravenous contrast. Multiplanar CT image reconstructions of the cervical spine were also generated. COMPARISON:  None. FINDINGS: CT HEAD FINDINGS Brain: Small amount of extra-axial blood along the left convexity near the vertex. Findings are suggestive for a small subdural hematoma. Blood is located in the left anterior parietal region. No other areas are concerning for intracranial hemorrhage. No evidence for large infarct, mass lesion, midline shift or hydrocephalus. Mild cerebral  atrophy. Small amount of low-density in the periventricular white matter. Vascular: No hyperdense vessel or unexpected calcification. Skull: Right frontal scalp hematoma without underlying fracture. No acute bone abnormality. Sinuses/Orbits: Visualized paranasal sinuses are clear. Other: None CT CERVICAL SPINE FINDINGS Alignment: Curvature of the lower cervical spine towards the right side. Skull base and vertebrae: No acute fracture. No primary bone lesion or focal pathologic process. Soft tissues and spinal canal: No prevertebral fluid or swelling. No visible canal hematoma. Disc levels: Multilevel disc space narrowing and endplate disease. Prominent facet disease on the left side at C3-C4. Upper chest: Lung apices are clear.  Negative for pneumothorax. Other: None IMPRESSION: 1. Small left subdural hematoma. No evidence for mass effect or midline shift. 2. Right frontal scalp hematoma.  No calvarial fracture. 3. Mild cerebral atrophy. Evidence for chronic small vessel ischemic changes. 4. Degenerative changes in the cervical spine without acute bone abnormality. Critical Value/emergent results were called by telephone at the time of interpretation on 09/28/2018 at 12:05 pm to Dr. Nanda Quinton , who verbally acknowledged these results. Electronically Signed   By: Markus Daft M.D.   On: 09/28/2018 12:08   Ct Cervical Spine Wo Contrast  Result Date: 09/28/2018 CLINICAL DATA:  Fall and head trauma.  Arm weakness. EXAM: CT HEAD WITHOUT CONTRAST CT CERVICAL SPINE WITHOUT CONTRAST TECHNIQUE: Multidetector CT imaging of the head and cervical spine was performed following the standard protocol without intravenous contrast. Multiplanar CT image reconstructions of the cervical spine were also generated. COMPARISON:  None. FINDINGS: CT HEAD FINDINGS Brain: Small amount of extra-axial blood along the left convexity near the vertex. Findings are suggestive for a small subdural hematoma. Blood is located in the left anterior  parietal region. No other areas are concerning for intracranial hemorrhage. No evidence for large infarct, mass lesion, midline shift or hydrocephalus. Mild cerebral atrophy. Small amount of low-density in the periventricular white matter. Vascular: No hyperdense vessel or unexpected calcification. Skull: Right frontal scalp hematoma without underlying fracture. No acute bone abnormality. Sinuses/Orbits: Visualized paranasal sinuses are  clear. Other: None CT CERVICAL SPINE FINDINGS Alignment: Curvature of the lower cervical spine towards the right side. Skull base and vertebrae: No acute fracture. No primary bone lesion or focal pathologic process. Soft tissues and spinal canal: No prevertebral fluid or swelling. No visible canal hematoma. Disc levels: Multilevel disc space narrowing and endplate disease. Prominent facet disease on the left side at C3-C4. Upper chest: Lung apices are clear.  Negative for pneumothorax. Other: None IMPRESSION: 1. Small left subdural hematoma. No evidence for mass effect or midline shift. 2. Right frontal scalp hematoma.  No calvarial fracture. 3. Mild cerebral atrophy. Evidence for chronic small vessel ischemic changes. 4. Degenerative changes in the cervical spine without acute bone abnormality. Critical Value/emergent results were called by telephone at the time of interpretation on 09/28/2018 at 12:05 pm to Dr. Nanda Quinton , who verbally acknowledged these results. Electronically Signed   By: Markus Daft M.D.   On: 09/28/2018 12:08     Assessment & Plan:  Plan  I am having [name] maintain his Psyllium (METAMUCIL PO), nitroGLYCERIN, lansoprazole, levothyroxine, PreserVision AREDS 2, isosorbide mononitrate, Calcium Carb-Cholecalciferol (CALCIUM+D3 PO), MAGNESIUM PO, Polyethyl Glycol-Propyl Glycol (SYSTANE OP), bismuth subsalicylate, Docusate Calcium (STOOL SOFTENER PO), aspirin EC, blood glucose meter kit and supplies, atorvastatin, carvedilol, metFORMIN, and  doxycycline.  Meds ordered this encounter  Medications   DISCONTD: doxycycline (VIBRA-TABS) 100 MG tablet    Sig: Take 1 tablet (100 mg total) by mouth 2 (two) times daily.    Dispense:  20 tablet    Refill:  0   doxycycline (VIBRA-TABS) 100 MG tablet    Sig: Take 1 tablet (100 mg total) by mouth 2 (two) times daily.    Dispense:  20 tablet    Refill:  0    Problem List Items Addressed This Visit    None    Visit Diagnoses    Tick bite of back, initial encounter    -  Primary   Relevant Medications   doxycycline (VIBRA-TABS) 100 MG tablet   Other Relevant Orders   B. burgdorfi antibodies   Rocky mtn spotted fvr ab, IgM-blood      Follow-up: Return if symptoms worsen or fail to improve.  [name], DO

## 2019-01-18 LAB — B. BURGDORFI ANTIBODIES: B burgdorferi Ab IgG+IgM: <0.9 index

## 2019-01-19 LAB — ROCKY MTN SPOTTED FVR AB, IGM-BLOOD: RMSF IgM: 0.18 index (ref 0.00–0.89)

## 2019-01-28 ENCOUNTER — Other Ambulatory Visit: Payer: Self-pay

## 2019-01-28 ENCOUNTER — Encounter: Payer: Self-pay | Admitting: Cardiology

## 2019-01-28 ENCOUNTER — Ambulatory Visit (INDEPENDENT_AMBULATORY_CARE_PROVIDER_SITE_OTHER): Payer: Medicare Other | Admitting: Cardiology

## 2019-01-28 VITALS — BP 172/70 | HR 73 | Ht 71.0 in | Wt 163.8 lb

## 2019-01-28 DIAGNOSIS — R29898 Other symptoms and signs involving the musculoskeletal system: Secondary | ICD-10-CM | POA: Diagnosis not present

## 2019-01-28 DIAGNOSIS — E782 Mixed hyperlipidemia: Secondary | ICD-10-CM

## 2019-01-28 DIAGNOSIS — I2510 Atherosclerotic heart disease of native coronary artery without angina pectoris: Secondary | ICD-10-CM | POA: Diagnosis not present

## 2019-01-28 DIAGNOSIS — I255 Ischemic cardiomyopathy: Secondary | ICD-10-CM | POA: Diagnosis not present

## 2019-01-28 MED ORDER — RANOLAZINE ER 500 MG PO TB12: 500.0000 mg | ORAL_TABLET | Freq: Every day | ORAL | 1 refills | Status: DC

## 2019-01-28 NOTE — Patient Instructions (Signed)
Medication Instructions:  If you need a refill on your cardiac medications before your next appointment, please call your pharmacy.  Your physician has recommended you make the following change in your medication:   START: Ranexa 500 mg; Take One Tab Daily STOP:  Imdur   Lab work: None  If you have labs (blood work) drawn today and your tests are completely normal, you will receive your results only by: [name] MyChart Message (if you have MyChart) OR . A paper copy in the mail If you have any lab test that is abnormal or we need to change your treatment, we will call you to review the results.  Testing/Procedures: Your physician has requested that you have an echocardiogram. Echocardiography is a painless test that uses sound waves to create images of your heart. It provides your doctor with information about the size and shape of your heart and how well your heart's chambers and valves are working. This procedure takes approximately one hour. There are no restrictions for this procedure.   Follow-Up: At [facility], you and your health needs are our priority.  As part of our continuing mission to provide you with exceptional heart care, we have created designated Provider Care Teams.  These Care Teams include your primary Cardiologist (physician) and Advanced Practice Providers (APPs -  Physician Assistants and Nurse Practitioners) who all work together to provide you with the care you need, when you need it. You will need a follow up appointment in 6 weeks.   Any Other Special Instructions Will Be Listed Below (If Applicable).

## 2019-01-28 NOTE — Progress Notes (Signed)
Cardiology Office Note:    Date:  01/28/2019   ID:  Evan Wright, DOB Jul 11, 1925, MRN [id number]  PCP:  Carollee Herter, Alferd Apa, DO  Cardiologist:  Jenne Campus, MD    Referring MD: Carollee Herter, Alferd Apa, *   Chief Complaint  Patient presents with  . Issues with heart rate  I am not feeling well  History of Present Illness:    Evan Wright is a 85 y.o. male past medical history significant for coronary artery disease, status post PTCA and drug-eluting stent to proximal LAD in 2018.  Apical akinesis with estimation ejection fraction of 45 to 50%, hypertension, diabetes, history of subdural hematoma.  He requested to be seen because of some very nonspecific complaint he complained of being weak tired exhausted.  Biggest complaint is the fact that he got weakness in his legs on top of that he describes situation that he feels something strange in his abdomen he said shaky-like sensation.  Story is very complicated few weeks ago he ended up going to the emergency room because of shakiness in the right arm 10 CT of his head was done he was discovered to have subdural hematoma.  At that time Plavix has been discontinued.  He has been on dual antiplatelets therapy since the impression irritation of his stent to LAD which was at the end of 2018.  Also because his blood pressure being low Imdur has been discontinued and eventually restarted back but on the small dose only 50 mg a day.  He denies having any chest pain tightness squeezing pressure burning chest but he complained of having short of breath very easily.  Is that an effort will bring it up just making tea in the morning and coffee in the morning will make it happen.  Past Medical History:  Diagnosis Date  . Adenomatous colon polyp   . Angina pectoris ([place]) 04/2017  . CAD (coronary artery disease)    a. Cath 04/29/17-> high grade pLAD with R to L collaterals, high grade 1st dig  2. Cath 05/01/17 s/p ostial to mLAD with DES x 2.   . Cardiac  murmur   . Coronary artery disease   . Diverticulosis   . GERD (gastroesophageal reflux disease)   . History of hiatal hernia   . Hyperlipidemia   . Loss of height 06/11/2016  . Osteoarthritis   . Prostate cancer [facility])     Past Surgical History:  Procedure Laterality Date  . APPENDECTOMY  1948  . COLONOSCOPY    . CORONARY STENT INTERVENTION N/A 05/01/2017   Procedure: CORONARY STENT INTERVENTION;  Surgeon: [name], [name], MD;  Location: [facility];  Service: Cardiovascular;  Laterality: N/A;  . EYE SURGERY  2012   march and April  --Dr [name]  . LEFT HEART CATH AND CORONARY ANGIOGRAPHY N/A 04/29/2017   Procedure: LEFT HEART CATH AND CORONARY ANGIOGRAPHY;  Surgeon: [name], MD;  Location: [facility];  Service: Cardiovascular;  Laterality: N/A;  . PROSTATECTOMY    . TONSILLECTOMY      Current Medications: Current Meds  Medication Sig  . aspirin EC 81 MG tablet Take 1 tablet (81 mg total) by mouth daily at 12 noon.  . bismuth subsalicylate (PEPTO BISMOL) 262 MG/15ML suspension Take 30 mLs by mouth every 6 (six) hours as needed for indigestion or diarrhea or loose stools.  . blood glucose meter kit and supplies KIT Dispense based on patient and insurance preference. Use up  to four times daily as directed. (FOR ICD-9 250.00, 250.01).  . Calcium Carb-Cholecalciferol (CALCIUM+D3 PO) Take 1 tablet by mouth daily at 12 noon.  . carvedilol (COREG) 3.125 MG tablet TAKE ONE TABLET BY MOUTH TWICE DAILY  . Docusate Calcium (STOOL SOFTENER PO) Take 1-2 capsules by mouth at bedtime.  [name] doxycycline (VIBRA-TABS) 100 MG tablet Take 1 tablet (100 mg total) by mouth 2 (two) times daily.  . lansoprazole (PREVACID) 30 MG capsule TAKE ONE CAPSULE BY MOUTH EVERY DAY AT 12 NOON WITH FOOD (Patient taking differently: Take 30 mg by mouth daily before breakfast. Open capsule and sprinkle on applesauce)  . levothyroxine (SYNTHROID, LEVOTHROID) 50 MCG tablet TAKE ONE TABLET BY MOUTH DAILY  (Patient taking differently: Take 50 mcg by mouth daily before breakfast. )  . MAGNESIUM PO Take 1 tablet by mouth daily at 12 noon.  . metFORMIN (GLUCOPHAGE) 500 MG tablet Take 1 tablet (500 mg total) by mouth 2 (two) times daily with a meal.  . Multiple Vitamins-Minerals (PRESERVISION AREDS 2) CAPS Take 1 capsule by mouth daily at 12 noon.  . nitroGLYCERIN (NITROSTAT) 0.4 MG SL tablet Place 1 tablet (0.4 mg total) under the tongue every 5 (five) minutes as needed for chest pain.  Vladimir Faster Glycol-Propyl Glycol (SYSTANE OP) Place 1 drop into both eyes daily as needed (dry eyes).  . Psyllium (METAMUCIL PO) Take 1 Dose by mouth every 3 (three) days.   . [DISCONTINUED] isosorbide mononitrate (IMDUR) 60 MG 24 hr tablet Take 30 mg by mouth daily after breakfast.     Allergies:   Penicillins   Social History   Socioeconomic History  . Marital status: Married    Spouse name: Not on file  . Number of children: 2  . Years of education: Not on file  . Highest education level: Not on file  Occupational History  . Occupation: retired    Fish farm manager: RETIRED  Social Needs  . Financial resource strain: Not on file  . Food insecurity    Worry: Not on file    Inability: Not on file  . Transportation needs    Medical: Not on file    Non-medical: Not on file  Tobacco Use  . Smoking status: Never Smoker  . Smokeless tobacco: Never Used  Substance and Sexual Activity  . Alcohol use: No  . Drug use: No  . Sexual activity: Not on file  Lifestyle  . Physical activity    Days per week: Not on file    Minutes per session: Not on file  . Stress: Not on file  Relationships  . Social Herbalist on phone: Not on file    Gets together: Not on file    Attends religious service: Not on file    Active member of club or organization: Not on file    Attends meetings of clubs or organizations: Not on file    Relationship status: Not on file  Other Topics Concern  . Not on file  Social  History Narrative  . Not on file     Family History: The patient's family history includes Colitis in his mother; Colon cancer in his brother; Lung cancer in his father; Prostate cancer in his father. ROS:   Please see the history of present illness.    All 14 point review of systems negative except as described per history of present illness  EKGs/Labs/Other Studies Reviewed:    Cardiac catheterization from 2019 showed:   99% proximal to  mid LAD, heavily calcified and thrombotic, with right to left collaterals.  Ramus intermedius with segmental 65-75% proximal narrowing and 99% mid to distal narrowing.  50% mid circumflex with 99% stenosis in small first obtuse marginal.  Dominant right coronary with 50% proximal narrowing. Third left ventricular branch, small with 95% stenosis. Right coronary supplies collaterals to the LAD.  Apical akinesis. EF estimated to be 45-50% by cath. Normal left ventricular end-diastolic pressure.   99% proximal to mid LAD stenosis.  A STENT SIERRA 3.00 X 15 MM drug eluting stent was successfully placed.  A STENT RESOLUTE ONYX 3.0X22 drug-eluting stent was successfully placed.  Post intervention, there is a 0% residual stenosis.   1. Successful stenting of the ostial to mid LAD with DES x 2.  Plan: DAPT for one year. Plan to treat other residual disease medically.  Recent Labs: 09/29/2018: Magnesium 1.8 10/02/2018: Hemoglobin 14.5; Platelets 201.0 01/14/2019: ALT 19; BUN 17; Creatinine, Ser 0.85; Potassium 4.3; Sodium 143; TSH 1.72  Recent Lipid Panel    Component Value Date/Time   CHOL 116 01/14/2019 1051   CHOL 147 09/22/2018 1050   TRIG 110.0 01/14/2019 1051   HDL 42.20 01/14/2019 1051   HDL 41 09/22/2018 1050   CHOLHDL 3 01/14/2019 1051   VLDL 22.0 01/14/2019 1051   LDLCALC 52 01/14/2019 1051   LDLCALC 74 09/22/2018 1050   LDLDIRECT 165.3 10/08/2010 1008    Physical Exam:    VS:  BP (!) 172/70   Pulse 73   Ht 5' 11"  (1.803 m)    Wt 163 lb 12.8 oz (74.3 kg)   SpO2 98%   BMI 22.85 kg/m     Wt Readings from Last 3 Encounters:  01/28/19 163 lb 12.8 oz (74.3 kg)  01/15/19 164 lb 3.2 oz (74.5 kg)  10/02/18 167 lb (75.8 kg)     GEN:  Well nourished, well developed in no acute distress HEENT: Normal NECK: No JVD; No carotid bruits LYMPHATICS: No lymphadenopathy CARDIAC: RRR, no murmurs, no rubs, no gallops RESPIRATORY:  Clear to auscultation without rales, wheezing or rhonchi  ABDOMEN: Soft, non-tender, non-distended MUSCULOSKELETAL:  No edema; No deformity  SKIN: Warm and dry LOWER EXTREMITIES: no swelling NEUROLOGIC:  Alert and oriented x 3 PSYCHIATRIC:  Normal affect   ASSESSMENT:    1. Ischemic cardiomyopathy   2. Coronary artery disease involving native coronary artery of native heart without angina pectoris   3. Mixed dyslipidemia   4. Weakness of both lower extremities    PLAN:    In order of problems listed above:  1. Ischemic cardiomyopathy last estimation of the ejection fraction in 2018 45-50.  I asked you to have echocardiogram repeated to check left ventricular ejection fraction. 2. Coronary artery disease status post PTCA and stenting of proximal LAD with residual obtuse marginal branch disease.  He has been on Imdur however because of low blood pressure that medication has been cut down significantly.  I asked him to stop the medication and asked him to start taking ranolazine 500 mg twice daily.  Also asked him to discontinue permanently his Plavix.  I think the risk of overwhelming now.  He does have history of falls and he did strike his head interestingly when a test was done showed some old subdural hematoma.  Therefore at least for now Plavix is to be discontinued. 3. Mixed dyslipidemia he is on high intensity statin which I will continue.  Call primary care physician to get fasting lipid profile 4. Weakness  of lower extremities look like neuropathy.  I encouraged him to be active and  try to exercise carefully to improve the strength of his legs.  His daughter was present with [place] during the discussion.   Medication Adjustments/Labs and Tests Ordered: Current medicines are reviewed at length with the patient today.  Concerns regarding medicines are outlined above.  Orders Placed This Encounter  Procedures  . ECHOCARDIOGRAM COMPLETE   Medication changes:  Meds ordered this encounter  Medications  . ranolazine (RANEXA) 500 MG 12 hr tablet    Sig: Take 1 tablet (500 mg total) by mouth daily.    Dispense:  30 tablet    Refill:  1    Signed, Park Liter, MD, [facility] 01/28/2019 12:47 PM    [place]

## 2019-01-29 NOTE — Addendum Note (Signed)
Addended by: Aleatha Borer on: 01/29/2019 03:32 PM   Modules accepted: Orders

## 2019-02-04 ENCOUNTER — Telehealth: Payer: Self-pay | Admitting: Family Medicine

## 2019-02-04 ENCOUNTER — Other Ambulatory Visit: Payer: Self-pay

## 2019-02-04 MED ORDER — METFORMIN HCL 500 MG PO TABS: 500.0000 mg | ORAL_TABLET | Freq: Two times a day (BID) | ORAL | 0 refills | Status: DC

## 2019-02-04 NOTE — Telephone Encounter (Signed)
Refill sent.

## 2019-02-04 NOTE — Telephone Encounter (Incomplete)
°  Pts daughter called stating pt is out of metformin. Pts daughter states the pharmacy has reached out, but has gotten no response. Pt is also requesting a 90 day supply instead of 30. Please advise.    [place], [place] - [address]  [address] [place] [address]  Phone: [phone] Fax: [phone]  Not a 24 hour pharmacy; exact hours not known.

## 2019-02-09 ENCOUNTER — Other Ambulatory Visit (HOSPITAL_BASED_OUTPATIENT_CLINIC_OR_DEPARTMENT_OTHER): Payer: Medicare Other

## 2019-02-10 ENCOUNTER — Other Ambulatory Visit (HOSPITAL_BASED_OUTPATIENT_CLINIC_OR_DEPARTMENT_OTHER): Payer: Medicare Other

## 2019-02-11 ENCOUNTER — Other Ambulatory Visit: Payer: Self-pay

## 2019-02-11 ENCOUNTER — Ambulatory Visit (HOSPITAL_BASED_OUTPATIENT_CLINIC_OR_DEPARTMENT_OTHER)
Admission: RE | Admit: 2019-02-11 | Discharge: 2019-02-11 | Disposition: A | Discharge status: Home / Self Care | Payer: Medicare Other | Source: Ambulatory Visit | Attending: Cardiology | Admitting: Cardiology

## 2019-02-11 DIAGNOSIS — I255 Ischemic cardiomyopathy: Secondary | ICD-10-CM | POA: Insufficient documentation

## 2019-02-11 NOTE — Progress Notes (Signed)
  Echocardiogram 2D Echocardiogram has been performed.  Evan Wright 02/11/2019, 11:11 AM

## 2019-02-12 ENCOUNTER — Ambulatory Visit: Payer: Medicare Other | Admitting: Neurology

## 2019-02-17 ENCOUNTER — Other Ambulatory Visit: Payer: Self-pay | Admitting: Family Medicine

## 2019-02-26 ENCOUNTER — Other Ambulatory Visit: Payer: Self-pay

## 2019-02-26 MED ORDER — RANOLAZINE ER 500 MG PO TB12: 500.0000 mg | ORAL_TABLET | Freq: Every day | ORAL | 1 refills | Status: DC

## 2019-03-19 ENCOUNTER — Ambulatory Visit: Payer: Medicare Other | Admitting: Cardiology

## 2019-03-25 ENCOUNTER — Ambulatory Visit: Payer: Medicare Other | Admitting: Cardiology

## 2019-03-26 ENCOUNTER — Other Ambulatory Visit: Payer: Self-pay | Admitting: Family Medicine

## 2019-04-21 ENCOUNTER — Ambulatory Visit: Payer: Medicare Other | Admitting: Neurology

## 2019-04-30 ENCOUNTER — Other Ambulatory Visit: Payer: Self-pay

## 2019-04-30 ENCOUNTER — Ambulatory Visit (INDEPENDENT_AMBULATORY_CARE_PROVIDER_SITE_OTHER): Payer: Medicare Other

## 2019-04-30 DIAGNOSIS — Z23 Encounter for immunization: Secondary | ICD-10-CM | POA: Diagnosis not present

## 2019-05-26 ENCOUNTER — Other Ambulatory Visit: Payer: Self-pay | Admitting: Family Medicine

## 2019-05-26 ENCOUNTER — Other Ambulatory Visit: Payer: Self-pay | Admitting: Cardiology

## 2019-05-29 ENCOUNTER — Other Ambulatory Visit: Payer: Self-pay | Admitting: Family Medicine

## 2019-06-11 NOTE — Telephone Encounter (Signed)
Received message that 05/26/19 mychart message was unread. Mailed letter.

## 2019-06-22 ENCOUNTER — Other Ambulatory Visit: Payer: Self-pay | Admitting: Family Medicine

## 2019-07-24 ENCOUNTER — Other Ambulatory Visit: Payer: Self-pay | Admitting: Cardiology

## 2019-08-02 ENCOUNTER — Other Ambulatory Visit: Payer: Self-pay | Admitting: Cardiology

## 2019-08-02 MED ORDER — RANOLAZINE ER 500 MG PO TB12: 500.0000 mg | ORAL_TABLET | Freq: Every day | ORAL | 0 refills | Status: DC

## 2019-08-02 NOTE — Telephone Encounter (Signed)
Refill sent.  Patient needs appointment for further fills.  Left message informing patient and pharmacy is aware.

## 2019-08-02 NOTE — Telephone Encounter (Signed)
*  STAT* If patient is at the pharmacy, call can be transferred to refill team.   1. Which medications need to be refilled? (please list name of each medication and dose if known) Ranolazine 500mg   2. Which pharmacy/location (including street and city if local pharmacy) is medication to [facility] sent to?[facility] [place]  3. Do they need a 30 day or 90 day supply? [place]

## 2019-08-10 ENCOUNTER — Other Ambulatory Visit: Payer: Self-pay

## 2019-08-10 ENCOUNTER — Ambulatory Visit (INDEPENDENT_AMBULATORY_CARE_PROVIDER_SITE_OTHER): Payer: Medicare Other | Admitting: Family Medicine

## 2019-08-10 ENCOUNTER — Encounter: Payer: Self-pay | Admitting: Family Medicine

## 2019-08-10 VITALS — Ht 71.0 in | Wt 162.0 lb

## 2019-08-10 DIAGNOSIS — I10 Essential (primary) hypertension: Secondary | ICD-10-CM | POA: Diagnosis not present

## 2019-08-10 DIAGNOSIS — E1169 Type 2 diabetes mellitus with other specified complication: Secondary | ICD-10-CM

## 2019-08-10 DIAGNOSIS — K5901 Slow transit constipation: Secondary | ICD-10-CM

## 2019-08-10 DIAGNOSIS — E039 Hypothyroidism, unspecified: Secondary | ICD-10-CM | POA: Diagnosis not present

## 2019-08-10 DIAGNOSIS — E785 Hyperlipidemia, unspecified: Secondary | ICD-10-CM

## 2019-08-10 DIAGNOSIS — E1165 Type 2 diabetes mellitus with hyperglycemia: Secondary | ICD-10-CM

## 2019-08-10 HISTORY — DX: Slow transit constipation: K59.01

## 2019-08-10 MED ORDER — METFORMIN HCL 500 MG PO TABS: 500.0000 mg | ORAL_TABLET | Freq: Two times a day (BID) | ORAL | 0 refills | Status: DC

## 2019-08-10 NOTE — Progress Notes (Signed)
Virtual Visit via Video Note  I connected with Evan Wright on 08/10/19 at  3:00 PM EST by a video enabled telemedicine application and verified that I am speaking with the correct person using two identifiers.  Location: Patient: home - with daughter  Provider: office   I discussed the limitations of evaluation and management by telemedicine and the availability of in person appointments. The patient expressed understanding and agreed to proceed.  History of Present Illness: Pt is home with his daughter   He c/o constipation and was told by card to use colace 3 a day --- this caused diarrhea that lasts for days.    No other complaints   HYPERTENSION   Blood pressure range-good per pt   Chest pain- no      Dyspnea- no Lightheadedness- no   Edema- no  Other side effects - no   Medication compliance: good Low salt diet- no    DIABETES    Blood Sugar ranges -good per pt   Polyuria- no New Visual problems- no  Hypoglycemic symptoms- no  Other side effects-no Medication compliance - good    HYPERLIPIDEMIA  Medication compliance- good RUQ pain- no  Muscle aches- no Other side effects-no       Observations/Objective: There were no vitals filed for this visit. Wt 162 lbs   Assessment and Plan: 1. Uncontrolled type 2 diabetes mellitus with hyperglycemia ([place]) hgba1c to be checked , minimize simple carbs. Increase exercise as tolerated. Continue current meds  - metFORMIN (GLUCOPHAGE) 500 MG tablet; Take 1 tablet (500 mg total) by mouth 2 (two) times daily with a meal.  Dispense: 180 tablet; Refill: 0  2. Hypothyroidism, unspecified type Check labs  con't meds   3. Essential hypertension Well controlled, no changes to meds. Encouraged heart healthy diet such as the DASH diet and exercise as tolerated.    4. Hyperlipidemia associated with type 2 diabetes mellitus ([place]) Tolerating statin, encouraged heart healthy diet, avoid trans fats, minimize simple carbs and  saturated fats. Increase exercise as tolerated   Follow Up Instructions:    I discussed the assessment and treatment plan with the patient. The patient was provided an opportunity to ask questions and all were answered. The patient agreed with the plan and demonstrated an understanding of the instructions.   The patient was advised to call back or seek an in-person evaluation if the symptoms worsen or if the condition fails to improve as anticipated.    [name], DO

## 2019-08-10 NOTE — Assessment & Plan Note (Signed)
Water Prunes Fiber  Probiotic Call back if no relief

## 2019-08-10 NOTE — Patient Instructions (Signed)
COVID-19 Vaccine Information can be found at: [email or web address] For questions related to vaccine distribution or appointments, please email [email or web address] or call [phone].    

## 2019-08-24 ENCOUNTER — Telehealth: Payer: Self-pay | Admitting: Family Medicine

## 2019-08-24 NOTE — Telephone Encounter (Signed)
Good Morning, Patient states that he needs a letter stating that he can take Covid 19 vaccine b/c he is on blood thinners.

## 2019-08-25 ENCOUNTER — Other Ambulatory Visit: Payer: Self-pay | Admitting: Cardiology

## 2019-08-25 NOTE — Telephone Encounter (Signed)
Called patient. Pt states he wasn't sure what I was talking about. States one of his daughters may have called.

## 2019-08-26 DIAGNOSIS — Z961 Presence of intraocular lens: Secondary | ICD-10-CM | POA: Diagnosis not present

## 2019-08-26 DIAGNOSIS — H02101 Unspecified ectropion of right upper eyelid: Secondary | ICD-10-CM | POA: Diagnosis not present

## 2019-08-26 DIAGNOSIS — H353131 Nonexudative age-related macular degeneration, bilateral, early dry stage: Secondary | ICD-10-CM | POA: Diagnosis not present

## 2019-08-26 DIAGNOSIS — H04123 Dry eye syndrome of bilateral lacrimal glands: Secondary | ICD-10-CM | POA: Diagnosis not present

## 2019-08-27 ENCOUNTER — Other Ambulatory Visit: Payer: Medicare Other

## 2019-09-01 ENCOUNTER — Other Ambulatory Visit: Payer: Self-pay | Admitting: Emergency Medicine

## 2019-09-01 ENCOUNTER — Other Ambulatory Visit: Payer: Self-pay

## 2019-09-01 ENCOUNTER — Other Ambulatory Visit (INDEPENDENT_AMBULATORY_CARE_PROVIDER_SITE_OTHER): Payer: Medicare Other

## 2019-09-01 DIAGNOSIS — E782 Mixed hyperlipidemia: Secondary | ICD-10-CM

## 2019-09-01 DIAGNOSIS — E039 Hypothyroidism, unspecified: Secondary | ICD-10-CM

## 2019-09-01 DIAGNOSIS — I10 Essential (primary) hypertension: Secondary | ICD-10-CM

## 2019-09-01 DIAGNOSIS — E1165 Type 2 diabetes mellitus with hyperglycemia: Secondary | ICD-10-CM

## 2019-09-01 LAB — MICROALBUMIN / CREATININE URINE RATIO
Creatinine,U: 74.2 mg/dL
Microalb Creat Ratio: 15 mg/g (ref 0.0–30.0)
Microalb, Ur: 11.2 mg/dL — ABNORMAL HIGH (ref 0.0–1.9)

## 2019-09-01 LAB — COMPREHENSIVE METABOLIC PANEL
ALT: 21 U/L (ref 0–53)
AST: 18 U/L (ref 0–37)
Albumin: 4.6 g/dL (ref 3.5–5.2)
Alkaline Phosphatase: 66 U/L (ref 39–117)
BUN: 22 mg/dL (ref 6–23)
CO2: 28 mEq/L (ref 19–32)
Calcium: 10 mg/dL (ref 8.4–10.5)
Chloride: 106 mEq/L (ref 96–112)
Creatinine, Ser: 1.04 mg/dL (ref 0.40–1.50)
GFR: 66.65 mL/min (ref >60.00)
Glucose, Bld: 133 mg/dL — ABNORMAL HIGH (ref 70–99)
Potassium: 4.3 mEq/L (ref 3.5–5.1)
Sodium: 143 mEq/L (ref 135–145)
Total Bilirubin: 0.8 mg/dL (ref 0.2–1.2)
Total Protein: 7.4 g/dL (ref 6.0–8.3)

## 2019-09-01 LAB — CBC
HCT: 43.4 % (ref 39.0–52.0)
Hemoglobin: 14.4 g/dL (ref 13.0–17.0)
MCHC: 33.2 g/dL (ref 30.0–36.0)
MCV: 94 fl (ref 78.0–100.0)
Platelets: 219 10*3/uL (ref 150.0–400.0)
RBC: 4.61 Mil/uL (ref 4.22–5.81)
RDW: 13.1 % (ref 11.5–15.5)
WBC: 6.6 10*3/uL (ref 4.0–10.5)

## 2019-09-01 LAB — TSH: TSH: 4.06 u[IU]/mL (ref 0.35–4.50)

## 2019-09-01 LAB — LIPID PANEL
Cholesterol: 141 mg/dL (ref 0–200)
HDL: 50.7 mg/dL (ref >39.00)
LDL Cholesterol: 65 mg/dL (ref 0–99)
NonHDL: 89.98
Total CHOL/HDL Ratio: 3
Triglycerides: 127 mg/dL (ref 0.0–149.0)
VLDL: 25.4 mg/dL (ref 0.0–40.0)

## 2019-09-01 LAB — HEMOGLOBIN A1C: Hgb A1c MFr Bld: 6.9 % — ABNORMAL HIGH (ref 4.6–6.5)

## 2019-09-01 NOTE — Progress Notes (Signed)
a1c

## 2019-09-03 ENCOUNTER — Encounter: Payer: Self-pay | Admitting: Family Medicine

## 2019-09-05 ENCOUNTER — Other Ambulatory Visit: Payer: Self-pay | Admitting: Family Medicine

## 2019-09-05 DIAGNOSIS — E1165 Type 2 diabetes mellitus with hyperglycemia: Secondary | ICD-10-CM

## 2019-09-05 DIAGNOSIS — E785 Hyperlipidemia, unspecified: Secondary | ICD-10-CM

## 2019-09-05 DIAGNOSIS — E1169 Type 2 diabetes mellitus with other specified complication: Secondary | ICD-10-CM

## 2019-09-06 ENCOUNTER — Other Ambulatory Visit: Payer: Self-pay | Admitting: Family Medicine

## 2019-09-06 DIAGNOSIS — R29898 Other symptoms and signs involving the musculoskeletal system: Secondary | ICD-10-CM

## 2019-09-06 MED ORDER — NONFORMULARY OR COMPOUNDED ITEM: 0 refills | Status: DC

## 2019-09-06 NOTE — Telephone Encounter (Signed)
printed

## 2019-09-09 ENCOUNTER — Telehealth: Payer: Self-pay | Admitting: Family Medicine

## 2019-09-09 NOTE — Telephone Encounter (Signed)
Called patient to schedule AWV, but no answer. SF

## 2019-10-08 ENCOUNTER — Other Ambulatory Visit: Payer: Self-pay | Admitting: Family Medicine

## 2019-10-08 DIAGNOSIS — E1165 Type 2 diabetes mellitus with hyperglycemia: Secondary | ICD-10-CM

## 2019-11-22 ENCOUNTER — Encounter: Payer: Self-pay | Admitting: Family

## 2019-11-22 ENCOUNTER — Other Ambulatory Visit: Payer: Self-pay

## 2019-11-22 ENCOUNTER — Telehealth (INDEPENDENT_AMBULATORY_CARE_PROVIDER_SITE_OTHER): Payer: Medicare Other | Admitting: Family

## 2019-11-22 DIAGNOSIS — B029 Zoster without complications: Secondary | ICD-10-CM | POA: Diagnosis not present

## 2019-11-22 MED ORDER — VALACYCLOVIR HCL 1 G PO TABS: 1000.0000 mg | ORAL_TABLET | Freq: Three times a day (TID) | ORAL | 0 refills | Status: DC

## 2019-11-22 NOTE — Progress Notes (Signed)
Virtual Visit via Video Note  I connected with Evan Wright on 11/22/19 at 12:40 PM EDT by a video enabled telemedicine application and verified that I am speaking with the correct person using two identifiers.  Location: Patient: home Provider: work   I discussed the limitations of evaluation and management by telemedicine and the availability of in person appointments. The patient expressed understanding and agreed to proceed.  History of Present Illness:  Patient is a 86 year old male who presents today for video visit accompanied by his daughter.  He reports that he initially developed a skin rash and some mild discomfort on the right side of his chest 2 days ago.  Initially he thought the discomfort was related to him having to use his walker outside on uneven ground and muscle soreness.  However he then noticed the rash.  At this point he notes minimal discomfort from the rash.  He is wondering if he may have shingles.  Observations/Objective:   Gen: Awake, alert, no acute distress Resp: Breathing is even and non-labored Psych: calm/pleasant demeanor Neuro: Alert and Oriented x 3, + facial symmetry, speech is clear. Skin: Blistering rash noted on right upper anterior chest extends around the rib cage to the back.  Some rash also noted on right upper inner arm  Assessment and Plan:  Herpes zoster-this is a new finding for this patient. Thankfully he is not having significant discomfort.  I recommended Tylenol as needed for pain.  Will initiate Valtrex 1000 mg 3 times daily for the next 7 days.  He and the daughter are advised to reach out to [place] should his symptoms worsen or if they are not improved in 1 to 2 weeks.  They both verbalized understanding.  Follow Up Instructions:    I discussed the assessment and treatment plan with the patient. The patient was provided an opportunity to ask questions and all were answered. The patient agreed with the plan and demonstrated an  understanding of the instructions.   The patient was advised to call back or seek an in-person evaluation if the symptoms worsen or if the condition fails to improve as anticipated.  Nance Pear, NP

## 2019-12-27 ENCOUNTER — Other Ambulatory Visit: Payer: Self-pay

## 2019-12-27 MED ORDER — LANSOPRAZOLE 30 MG PO CPDR: DELAYED_RELEASE_CAPSULE | ORAL | 0 refills | Status: DC

## 2020-01-21 ENCOUNTER — Other Ambulatory Visit: Payer: Self-pay | Admitting: Family Medicine

## 2020-01-21 DIAGNOSIS — E1165 Type 2 diabetes mellitus with hyperglycemia: Secondary | ICD-10-CM

## 2020-01-31 ENCOUNTER — Other Ambulatory Visit: Payer: Self-pay

## 2020-01-31 ENCOUNTER — Ambulatory Visit: Payer: Medicare Other | Admitting: Podiatry

## 2020-01-31 ENCOUNTER — Encounter: Payer: Self-pay | Admitting: Podiatry

## 2020-01-31 VITALS — BP 144/88 | HR 69

## 2020-01-31 DIAGNOSIS — M79675 Pain in left toe(s): Secondary | ICD-10-CM | POA: Diagnosis not present

## 2020-01-31 DIAGNOSIS — E119 Type 2 diabetes mellitus without complications: Secondary | ICD-10-CM

## 2020-01-31 DIAGNOSIS — M79674 Pain in right toe(s): Secondary | ICD-10-CM | POA: Diagnosis not present

## 2020-01-31 DIAGNOSIS — M2042 Other hammer toe(s) (acquired), left foot: Secondary | ICD-10-CM

## 2020-01-31 DIAGNOSIS — M2041 Other hammer toe(s) (acquired), right foot: Secondary | ICD-10-CM | POA: Diagnosis not present

## 2020-01-31 DIAGNOSIS — E1165 Type 2 diabetes mellitus with hyperglycemia: Secondary | ICD-10-CM | POA: Diagnosis not present

## 2020-01-31 DIAGNOSIS — B351 Tinea unguium: Secondary | ICD-10-CM

## 2020-01-31 DIAGNOSIS — M2011 Hallux valgus (acquired), right foot: Secondary | ICD-10-CM | POA: Diagnosis not present

## 2020-01-31 DIAGNOSIS — L84 Corns and callosities: Secondary | ICD-10-CM | POA: Diagnosis not present

## 2020-01-31 DIAGNOSIS — M2012 Hallux valgus (acquired), left foot: Secondary | ICD-10-CM

## 2020-01-31 NOTE — Patient Instructions (Addendum)
Diabetes Mellitus and Foot Care Foot care is an important part of your health, especially when you have diabetes. Diabetes may cause you to have problems because of poor blood flow (circulation) to your feet and legs, which can cause your skin to:  Become thinner and drier.  Break more easily.  Heal more slowly.  Peel and crack. You may also have nerve damage (neuropathy) in your legs and feet, causing decreased feeling in them. This means that you may not notice minor injuries to your feet that could lead to more serious problems. Noticing and addressing any potential problems early is the best way to prevent future foot problems. How to care for your feet Foot hygiene  Wash your feet daily with warm water and mild soap. Do not use hot water. Then, pat your feet and the areas between your toes until they are completely dry. Do not soak your feet as this can dry your skin.  Trim your toenails straight across. Do not dig under them or around the cuticle. File the edges of your nails with an emery board or nail file.  Apply a moisturizing lotion or petroleum jelly to the skin on your feet and to dry, brittle toenails. Use lotion that does not contain alcohol and is unscented. Do not apply lotion between your toes. Shoes and socks  Wear clean socks or stockings every day. Make sure they are not too tight. Do not wear knee-high stockings since they may decrease blood flow to your legs.  Wear shoes that fit properly and have enough cushioning. Always look in your shoes before you put them on to be sure there are no objects inside.  To break in new shoes, wear them for just a few hours a day. This prevents injuries on your feet. Wounds, scrapes, corns, and calluses  Check your feet daily for blisters, cuts, bruises, sores, and redness. If you cannot see the bottom of your feet, use a mirror or ask someone for help.  Do not cut corns or calluses or try to remove them with medicine.  If you  find a minor scrape, cut, or break in the skin on your feet, keep it and the skin around it clean and dry. You may clean these areas with mild soap and water. Do not clean the area with peroxide, alcohol, or iodine.  If you have a wound, scrape, corn, or callus on your foot, look at it several times a day to make sure it is healing and not infected. Check for: ? Redness, swelling, or pain. ? Fluid or blood. ? Warmth. ? Pus or a bad smell. General instructions  Do not cross your legs. This may decrease blood flow to your feet.  Do not use heating pads or hot water bottles on your feet. They may burn your skin. If you have lost feeling in your feet or legs, you may not know this is happening until it is too late.  Protect your feet from hot and cold by wearing shoes, such as at the beach or on hot pavement.  Schedule a complete foot exam at least once a year (annually) or more often if you have foot problems. If you have foot problems, report any cuts, sores, or bruises to your health care provider immediately. Contact a health care provider if:  You have a medical condition that increases your risk of infection and you have any cuts, sores, or bruises on your feet.  You have an injury that is not   healing.  You have redness on your legs or feet.  You feel burning or tingling in your legs or feet.  You have pain or cramps in your legs and feet.  Your legs or feet are numb.  Your feet always feel cold.  You have pain around a toenail. Get help right away if:  You have a wound, scrape, corn, or callus on your foot and: ? You have pain, swelling, or redness that gets worse. ? You have fluid or blood coming from the wound, scrape, corn, or callus. ? Your wound, scrape, corn, or callus feels warm to the touch. ? You have pus or a bad smell coming from the wound, scrape, corn, or callus. ? You have a fever. ? You have a red line going up your leg. Summary  Check your feet every day  for cuts, sores, red spots, swelling, and blisters.  Moisturize feet and legs daily.  Wear shoes that fit properly and have enough cushioning.  If you have foot problems, report any cuts, sores, or bruises to your health care provider immediately.  Schedule a complete foot exam at least once a year (annually) or more often if you have foot problems. This information is not intended to replace advice given to you by your health care provider. Make sure you discuss any questions you have with your health care provider. Document Revised: 03/31/2019 Document Reviewed: 08/09/2016 Elsevier Patient Education  [place]? An infection that lies within the keratin of your nail plate that is caused by a fungus.  WHY ME? Fungal infections affect all ages, sexes, races, and creeds.  There may be many factors that predispose you to a fungal infection such as age, coexisting medical conditions such as diabetes, or an autoimmune disease; stress, medications, fatigue, genetics, etc.  Bottom line: fungus thrives in a warm, moist environment and your shoes offer such a location.  IS IT CONTAGIOUS? Theoretically, yes.  You do not want to share shoes, nail clippers or files with someone who has fungal toenails.  Walking around barefoot in the same room or sleeping in the same bed is unlikely to transfer the organism.  It is important to realize, however, that fungus can spread easily from one nail to the next on the same foot.  HOW DO WE TREAT THIS?  There are several ways to treat this condition.  Treatment may depend on many factors such as age, medications, pregnancy, liver and kidney conditions, etc.  It is best to ask your doctor which options are available to you.  No treatment.   Unlike many other medical concerns, you can live with this condition.  However for many people this can be a painful condition and may lead to ingrown toenails or a bacterial  infection.  It is recommended that you keep the nails cut short to help reduce the amount of fungal nail. Topical treatment.  These range from herbal remedies to prescription strength nail lacquers.  About 40-50% effective, topicals require twice daily application for approximately 9 to 12 months or until an entirely new nail has grown out.  The most effective topicals are medical grade medications available through physicians offices. Oral antifungal medications.  With an 80-90% cure rate, the most common oral medication requires 3 to 4 months of therapy and stays in your system for a year as the new nail grows out.  Oral antifungal medications do require blood work to make sure it  is a safe drug for you.  A liver function panel will be performed prior to starting the medication and after the first month of treatment.  It is important to have the blood work performed to avoid any harmful side effects.  In general, this medication safe but blood work is required. Laser Therapy.  This treatment is performed by applying a specialized laser to the affected nail plate.  This therapy is noninvasive, fast, and non-painful.  It is not covered by insurance and is therefore, out of pocket.  The results have been very good with a 80-95% cure rate.  [facility] is the only practice in the area to offer this therapy. Permanent Nail Avulsion.  Removing the entire nail so that a new nail will not grow back.   Corns and Calluses Corns are small areas of thickened skin that occur on the top, sides, or tip of a toe. They contain a cone-shaped core with a point that can press on a nerve below. This causes pain.  Calluses are areas of thickened skin that can occur anywhere on the body, including the hands, fingers, palms, soles of the feet, and heels. Calluses are usually larger than corns. What are the causes? Corns and calluses are caused by rubbing (friction) or pressure, such as from shoes that are too tight  or do not fit properly. What increases the risk? Corns are more likely to develop in people who have misshapen toes (toe deformities), such as hammer toes. Calluses can occur with friction to any area of the skin. They are more likely to develop in people who:  Work with their hands.  Wear shoes that fit poorly, are too tight, or are high-heeled.  Have toe deformities. What are the signs or symptoms? Symptoms of a corn or callus include:  A hard growth on the skin.  Pain or tenderness under the skin.  Redness and swelling.  Increased discomfort while wearing tight-fitting shoes, if your feet are affected. If a corn or callus becomes infected, symptoms may include:  Redness and swelling that gets worse.  Pain.  Fluid, blood, or pus draining from the corn or callus. How is this diagnosed? Corns and calluses may be diagnosed based on your symptoms, your medical history, and a physical exam. How is this treated? Treatment for corns and calluses may include:  Removing the cause of the friction or pressure. This may involve: ? Changing your shoes. ? Wearing shoe inserts (orthotics) or other protective layers in your shoes, such as a corn pad. ? Wearing gloves.  Applying medicine to the skin (topical medicine) to help soften skin in the hardened, thickened areas.  Removing layers of dead skin with a file to reduce the size of the corn or callus.  Removing the corn or callus with a scalpel or laser.  Taking antibiotic medicines, if your corn or callus is infected.  Having surgery, if a toe deformity is the cause. Follow these instructions at home:   Take over-the-counter and prescription medicines only as told by your health care provider.  If you were prescribed an antibiotic, take it as told by your health care provider. Do not stop taking it even if your condition starts to improve.  Wear shoes that fit well. Avoid wearing high-heeled shoes and shoes that are too tight  or too loose.  Wear any padding, protective layers, gloves, or orthotics as told by your health care provider.  Soak your hands or feet and then use  a file or pumice stone to soften your corn or callus. Do this as told by your health care provider.  Check your corn or callus every day for symptoms of infection. Contact a health care provider if you:  Notice that your symptoms do not improve with treatment.  Have redness or swelling that gets worse.  Notice that your corn or callus becomes painful.  Have fluid, blood, or pus coming from your corn or callus.  Have new symptoms. Summary  Corns are small areas of thickened skin that occur on the top, sides, or tip of a toe.  Calluses are areas of thickened skin that can occur anywhere on the body, including the hands, fingers, palms, and soles of the feet. Calluses are usually larger than corns.  Corns and calluses are caused by rubbing (friction) or pressure, such as from shoes that are too tight or do not fit properly.  Treatment may include wearing any padding, protective layers, gloves, or orthotics as told by your health care provider. This information is not intended to replace advice given to you by your health care provider. Make sure you discuss any questions you have with your health care provider. Document Revised: 10/28/2018 Document Reviewed: 05/21/2017 Elsevier Patient Education  2020 [place].   Edema  Edema is an abnormal buildup of fluids in the body tissues and under the skin. Swelling of the legs, feet, and ankles is a common symptom that becomes more likely as you get older. Swelling is also common in looser tissues, like around the eyes. When the affected area is squeezed, the fluid may move out of that spot and leave a dent for a few moments. This dent is called pitting edema. There are many possible causes of edema. Eating too much salt (sodium) and being on your feet or sitting for a long time can cause  edema in your legs, feet, and ankles. Hot weather may make edema worse. Common causes of edema include:  Heart failure.  Liver or kidney disease.  Weak leg blood vessels.  Cancer.  An injury.  Pregnancy.  Medicines.  Being obese.  Low protein levels in the blood. Edema is usually painless. Your skin may look swollen or shiny. Follow these instructions at home:  Keep the affected body part raised (elevated) above the level of your heart when you are sitting or lying down.  Do not sit still or stand for long periods of time.  Do not wear tight clothing. Do not wear garters on your upper legs.  Exercise your legs to get your circulation going. This helps to move the fluid back into your blood vessels, and it may help the swelling go down.  Wear elastic bandages or support stockings to reduce swelling as told by your health care provider.  Eat a low-salt (low-sodium) diet to reduce fluid as told by your health care provider.  Depending on the cause of your swelling, you may need to limit how much fluid you drink (fluid restriction).  Take over-the-counter and prescription medicines only as told by your health care provider. Contact a health care provider if:  Your edema does not get better with treatment.  You have heart, liver, or kidney disease and have symptoms of edema.  You have sudden and unexplained weight gain. Get help right away if:  You develop shortness of breath or chest pain.  You cannot breathe when you lie down.  You develop pain, redness, or warmth in the swollen areas.  You have  heart, liver, or kidney disease and suddenly get edema.  You have a fever and your symptoms suddenly get worse. Summary  Edema is an abnormal buildup of fluids in the body tissues and under the skin.  Eating too much salt (sodium) and being on your feet or sitting for a long time can cause edema in your legs, feet, and ankles.  Keep the affected body part raised  (elevated) above the level of your heart when you are sitting or lying down. This information is not intended to replace advice given to you by your health care provider. Make sure you discuss any questions you have with your health care provider. Document Revised: 11/25/2018 Document Reviewed: 08/10/2016 Elsevier Patient Education  [place].

## 2020-02-04 NOTE — Progress Notes (Signed)
Subjective: Evan Wright presents today referred by Evan Wright, Evan Apa, DO for diabetic foot evaluation.  Patient's daughter, Evan Wright, is present during today's visit.She assists with patient's history.   Patient relates 3 year history of diabetes.  Patient denies any history of foot wounds.  Patient denies any history of numbness, tingling, burning, pins/needles sensations. Daughter states he does have some neuropathy.  Today, patient c/o of painful, discolored, thick toenails which interfere with daily activities.  Pain is aggravated when wearing enclosed shoe gear.  He states nails have become more difficult for him to manage. He also relates lesion between right 4th and 5th digits. He puts Neosporin on it daily.   Past Medical History:  Diagnosis Date  . Adenomatous colon polyp   . Angina pectoris ([place]) 04/2017  . CAD (coronary artery disease)    a. Cath 04/29/17-> high grade pLAD with R to L collaterals, high grade 1st dig  2. Cath 05/01/17 s/p ostial to mLAD with DES x 2.   . Cardiac murmur   . Coronary artery disease   . Diverticulosis   . GERD (gastroesophageal reflux disease)   . History of hiatal hernia   . Hyperlipidemia   . Loss of height 06/11/2016  . Osteoarthritis   . Prostate cancer [facility])     Patient Active Problem List   Diagnosis Date Noted  . Slow transit constipation 08/10/2019  . SDH (subdural hematoma) ([place]) 09/28/2018  . Seizure-like activity ([place]) 09/28/2018  . Right arm weakness 09/28/2018  . Preventative health care 09/23/2018  . Uncontrolled diabetes mellitus with hyperglycemia ([place]) 09/23/2018  . Osteopenia 09/25/2017  . Essential hypertension 09/25/2017  . Hyperglycemia 09/25/2017  . Hypothyroidism 09/25/2017  . CAD (coronary artery disease), native coronary artery 04/29/2017  . Nonsustained ventricular tachycardia ([place]) 04/24/2017  . Ischemic cardiomyopathy 04/10/2017  . Mixed dyslipidemia 04/10/2017  . Abnormal EKG 04/02/2017  .  Idiopathic peripheral neuropathy 10/11/2016  . Proximal leg weakness 10/11/2016  . Weakness of both lower extremities 06/11/2016  . De Quervain's tenosynovitis 05/18/2012  . Family history of malignant neoplasm of gastrointestinal tract 05/14/2011  . NEVI, MULTIPLE 07/12/2010  . OSTEOARTHRITIS 07/12/2010  . UNSPECIFIED VITAMIN D DEFICIENCY 02/11/2008  . HIATAL HERNIA WITH REFLUX 02/11/2008  . GASTROENTERITIS 01/20/2008  . PERSONAL HISTORY OF COLONIC POLYPS 11/17/2007  . DIZZINESS 10/15/2007  . PROSTATE CANCER, HX OF 10/15/2007    Past Surgical History:  Procedure Laterality Date  . APPENDECTOMY  1948  . COLONOSCOPY    . CORONARY STENT INTERVENTION N/A 05/01/2017   Procedure: CORONARY STENT INTERVENTION;  Surgeon: [name], [name], MD;  Location: [place] [facility];  Service: Cardiovascular;  Laterality: N/A;  . EYE SURGERY  2012   march and April  --Dr [name]  . LEFT HEART CATH AND CORONARY ANGIOGRAPHY N/A 04/29/2017   Procedure: LEFT HEART CATH AND CORONARY ANGIOGRAPHY;  Surgeon: [name], MD;  Location: [place] [facility];  Service: Cardiovascular;  Laterality: N/A;  . PROSTATECTOMY    . TONSILLECTOMY      Current Outpatient Medications on File Prior to Visit  Medication Sig Dispense Refill  . aspirin EC 81 MG tablet Take 1 tablet (81 mg total) by mouth daily at 12 noon.    [name] atorvastatin (LIPITOR) 80 MG tablet TAKE ONE TABLET BY MOUTH ONCE DAILY 90 tablet 1  . bismuth subsalicylate (PEPTO BISMOL) 262 MG/15ML suspension Take 30 mLs by mouth every 6 (six) hours as needed for indigestion or diarrhea or loose stools.    [name]  blood glucose meter kit and supplies KIT Dispense based on patient and insurance preference. Use up to four times daily as directed. (FOR ICD-9 250.00, 250.01). 1 each 0  . Calcium Carb-Cholecalciferol (CALCIUM+D3 PO) Take 1 tablet by mouth daily at 12 noon.    . carvedilol (COREG) 3.125 MG tablet TAKE ONE TABLET BY MOUTH TWICE DAILY 180 tablet 1  . Docusate  Calcium (STOOL SOFTENER PO) Take 1-2 capsules by mouth at bedtime.    . lansoprazole (PREVACID) 30 MG capsule TAKE ONE CAPSULE BY MOUTH AT 12 NOON WITH FOOD 90 capsule 0  . levothyroxine (SYNTHROID) 50 MCG tablet TAKE ONE TABLET BY MOUTH DAILY 90 tablet 1  . MAGNESIUM PO Take 1 tablet by mouth daily at 12 noon.    . metFORMIN (GLUCOPHAGE) 500 MG tablet TAKE ONE TABLET (500 MG TOTAL) BY MOUTH TWICE DAILY WITH A MEAL 180 tablet 0  . Multiple Vitamins-Minerals (PRESERVISION AREDS 2) CAPS Take 1 capsule by mouth daily at 12 noon.    . nitroGLYCERIN (NITROSTAT) 0.4 MG SL tablet Place 1 tablet (0.4 mg total) under the tongue every 5 (five) minutes as needed for chest pain. 25 tablet 11  . NONFORMULARY OR COMPOUNDED ITEM Rolling walker #1  As directed ---- dx peripheral neuropathy, cad , low ext weakness 1 each 0  . NONFORMULARY OR COMPOUNDED ITEM Manual wheelchair  #1  As directed   Dx peripheral neuropathy, b/l low ext weakness 1 each 0  . Polyethyl Glycol-Propyl Glycol (SYSTANE OP) Place 1 drop into both eyes daily as needed (dry eyes).    . Psyllium (METAMUCIL PO) Take 1 Dose by mouth every 3 (three) days.     . ranolazine (RANEXA) 500 MG 12 hr tablet TAKE ONE TABLET (500 MG TOTAL) BY MOUTH ONCE DAILY 180 tablet 0  . valACYclovir (VALTREX) 1000 MG tablet Take 1 tablet (1,000 mg total) by mouth 3 (three) times daily. 21 tablet 0   No current facility-administered medications on file prior to visit.     Allergies  Allergen Reactions  . Penicillins Other (See Comments)    Unknown reaction Did it involve swelling of the face/tongue/throat, SOB, or low BP? Unknown Did it involve sudden or severe rash/hives, skin peeling, or any reaction on the inside of your mouth or nose? Unknown Did you need to seek medical attention at a hospital or doctor's office? Unknown When did it last happen?1940 If all above answers are "NO", may proceed with cephalosporin use.    Social History   Occupational  History  . Occupation: retired    Fish farm manager: RETIRED  Tobacco Use  . Smoking status: Never Smoker  . Smokeless tobacco: Never Used  Vaping Use  . Vaping Use: Never used  Substance and Sexual Activity  . Alcohol use: No  . Drug use: No  . Sexual activity: Not on file    Family History  Problem Relation Age of Onset  . Colon cancer Brother   . Lung cancer Father   . Prostate cancer Father   . Colitis Mother     Immunization History  Administered Date(s) Administered  . Fluad Quad(high Dose 65+) 04/30/2019  . Influenza Split 05/18/2012, 08/06/2014  . Influenza Whole 05/09/2010  . Influenza, High Dose Seasonal PF 06/11/2016, 05/02/2017, 04/13/2018  . Influenza-Unspecified 05/22/2013  . Pneumococcal Conjugate-13 08/06/2014  . Pneumococcal Polysaccharide-23 07/03/2005    Review of systems: Positive Findings in bold print.  Constitutional:  chills, fatigue, fever, sweats, weight change Communication: Optometrist, sign Ecologist,  hand writing, iPad/Android device Head: headaches, head injury Eyes: changes in vision, eye pain, glaucoma, cataracts, macular degeneration, diplopia, glare,  light sensitivity, eyeglasses or contacts, blindness Ears nose mouth throat: hearing impaired, hearing aids,  ringing in ears, deaf, sign language,  vertigo, nosebleeds,  rhinitis,  cold sores, snoring, swollen glands Cardiovascular: HTN, edema, arrhythmia, pacemaker in place, defibrillator in place, chest pain/tightness, chronic anticoagulation, blood clot, heart failure, MI Peripheral Vascular: leg cramps, varicose veins, blood clots, lymphedema, varicosities Respiratory:  difficulty breathing, denies congestion, SOB, wheezing, cough, emphysema Gastrointestinal: change in appetite or weight, abdominal pain, constipation, diarrhea, nausea, vomiting, vomiting blood, change in bowel habits, abdominal pain, jaundice, rectal bleeding, hemorrhoids, GERD Genitourinary:  nocturia,  pain on  urination, polyuria,  blood in urine, Foley catheter, urinary urgency, ESRD on hemodialysis Musculoskeletal: amputation, cramping, stiff joints, painful joints, decreased joint motion, fractures, OA, gout, hemiplegia, paraplegia, uses cane, wheelchair bound, uses walker, uses rollator Skin: +changes in toenails, color change, dryness, itching, mole changes,  rash, wound(s) Neurological: headaches, numbness in feet, paresthesias in feet, burning in feet, fainting,  seizures, change in speech,  headaches, memory problems/poor historian, cerebral palsy, weakness, paralysis, CVA, TIA Endocrine: diabetes, hypothyroidism, hyperthyroidism,  goiter, dry mouth, flushing, heat intolerance, cold intolerance,  excessive thirst, denies polyuria,  nocturia Hematological:  easy bleeding, excessive bleeding, easy bruising, enlarged lymph nodes, on long term blood thinner, history of past transusions Allergy/immunological:  hives, eczema, frequent infections, multiple drug allergies, seasonal allergies, transplant recipient, multiple food allergies Psychiatric:  anxiety, depression, Wright disorder, suicidal ideations, hallucinations, insomnia  Objective: Vitals:   01/31/20 1044  BP: (!) 144/88  Pulse: Evan Wright is a/an 86 y.o. male WD, WN in NAD.[name] AAO X 3.  Vascular Examination: Capillary fill time to digits <3 seconds b/l lower extremities. Palpable pedal pulses b/l LE. Palpable PT pulse(s) b/l lower extremities Faintly palpable DP pulse(s) b/l lower extremities. Pedal hair absent. Lower extremity skin temperature gradient within normal limits. No pain with calf compression b/l. Nonpitting edema noted b/l lower extremities. No ischemia or gangrene noted b/l lower extremities.  Dermatological Examination: Pedal skin is thin shiny, atrophic b/l lower extremities and R 5th toe. No open wounds bilaterally. No interdigital macerations bilaterally. Toenails 1-5 b/l elongated, discolored, dystrophic,  thickened, crumbly with subungual debris and tenderness to dorsal palpation. Hyperkeratotic lesion(s) R 5th toe.  No erythema, no edema, no drainage, no flocculence.  Musculoskeletal Examination: Normal muscle strength 5/5 to all lower extremity muscle groups bilaterally. No pain crepitus or joint limitation noted with ROM b/l. Hallux valgus with bunion deformity noted b/l lower extremities. Hammertoes noted to the 2-5 bilaterally.  Footwear Assessment: Does the patient wear appropriate shoes? Yes Does the patient need inserts/orthotics? No.  Neurological Examination: Protective sensation decreased with 10 gram monofilament b/l. Vibratory sensation intact b/l. Proprioception intact bilaterally. Deep tendon reflexes normal b/l.  Clonus negative b/l.  Hemoglobin A1C Latest Ref Rng & Units 09/01/2019  HGBA1C 4.6 - 6.5 % 6.9(H)  Some recent data might be hidden   Assessment: 1. Pain due to onychomycosis of toenails of both feet   2. Corns   3. Acquired hammertoes of both feet   4. Hallux valgus, acquired, bilateral   5. Uncontrolled type 2 diabetes mellitus with hyperglycemia ([place])   6. Encounter for diabetic foot exam ([place])      ADA Risk Categorization:  High Risk:  Patient has one or more of the following: Loss of protective sensation Absent pedal pulses  Severe Foot deformity History of foot ulcer  Plan: -Examined patient. -Diabetic foot examination performed on today's visit. -Discussed diabetic foot care principles. Literature dispensed on today. -Toenails 1-5 b/l were debrided in length and girth with sterile nail nippers and dremel without iatrogenic bleeding.  -Corn(s) R 5th toe pared utilizing sterile scalpel blade without complication or incident. Total number debrided=1. -Patient to report any pedal injuries to medical professional immediately. -Patient to continue soft, supportive shoe gear daily. -Patient/POA to call should there be question/concern in the  interim.  Return in about 3 months (around 05/02/2020) for diabetic nail trim.

## 2020-02-14 ENCOUNTER — Ambulatory Visit: Payer: Medicare Other | Admitting: Cardiology

## 2020-02-14 ENCOUNTER — Encounter: Payer: Self-pay | Admitting: Cardiology

## 2020-02-14 ENCOUNTER — Other Ambulatory Visit: Payer: Self-pay

## 2020-02-14 VITALS — BP 140/82 | HR 70 | Ht 71.0 in | Wt 163.0 lb

## 2020-02-14 DIAGNOSIS — I10 Essential (primary) hypertension: Secondary | ICD-10-CM

## 2020-02-14 DIAGNOSIS — I255 Ischemic cardiomyopathy: Secondary | ICD-10-CM | POA: Diagnosis not present

## 2020-02-14 DIAGNOSIS — I472 Ventricular tachycardia: Secondary | ICD-10-CM | POA: Diagnosis not present

## 2020-02-14 DIAGNOSIS — I2510 Atherosclerotic heart disease of native coronary artery without angina pectoris: Secondary | ICD-10-CM

## 2020-02-14 DIAGNOSIS — E782 Mixed hyperlipidemia: Secondary | ICD-10-CM

## 2020-02-14 DIAGNOSIS — I4729 Other ventricular tachycardia: Secondary | ICD-10-CM

## 2020-02-14 NOTE — Progress Notes (Signed)
Cardiology Office Note:    Date:  02/14/2020   ID:  Evan Wright, DOB 13-May-1925, MRN [id number]  PCP:  Carollee Herter, Alferd Apa, DO  Cardiologist:  Jenean Lindau, MD   Referring MD: Carollee Herter, Alferd Apa, *    ASSESSMENT:    1. Coronary artery disease involving native coronary artery of native heart without angina pectoris   2. Essential hypertension   3. Ischemic cardiomyopathy   4. Nonsustained ventricular tachycardia ([place])   5. Mixed dyslipidemia    PLAN:    In order of problems listed above:  1. Coronary artery disease: Secondary prevention stressed with the patient.  Importance of compliance with diet medication stressed and he vocalized understanding.  He is ambulating age appropriately. 2. Essential hypertension: Blood pressure is stable 3. Mixed dyslipidemia: Lipids were reviewed and he will have blood work today. 4. Diabetes mellitus: Diet was emphasized.  He is taking good care of himself.Patient will be seen in follow-up appointment in 6 months or earlier if the patient has any concerns   Medication Adjustments/Labs and Tests Ordered: Current medicines are reviewed at length with the patient today.  Concerns regarding medicines are outlined above.  Orders Placed This Encounter  Procedures   EKG 12-Lead   No orders of the defined types were placed in this encounter.    Chief Complaint  Patient presents with   Follow-up     History of Present Illness:    Evan Wright is a 86 y.o. male.  Patient has past medical history of coronary artery disease, essential hypertension dyslipidemia diabetes mellitus.  He denies any problems at this time and takes care of activities of daily living.  No chest pain orthopnea or PND.  He occasionally has chest discomfort not related to exertion.  He has been initiated on ranolazine by his primary care physician and is tolerating it well.  His family is very supportive and is daughter accompanies him for the visit today.  At the  time of my evaluation, the patient is alert awake oriented and in no distress.  Past Medical History:  Diagnosis Date   Adenomatous colon polyp    Angina pectoris ([place]) 04/2017   CAD (coronary artery disease)    a. Cath 04/29/17-> high grade pLAD with R to L collaterals, high grade 1st dig  2. Cath 05/01/17 s/p ostial to mLAD with DES x 2.    Cardiac murmur    Coronary artery disease    Diverticulosis    GERD (gastroesophageal reflux disease)    History of hiatal hernia    Hyperlipidemia    Loss of height 06/11/2016   Osteoarthritis    Prostate cancer [facility])     Past Surgical History:  Procedure Laterality Date   APPENDECTOMY  1948   COLONOSCOPY     CORONARY STENT INTERVENTION N/A 05/01/2017   Procedure: CORONARY STENT INTERVENTION;  Surgeon: [name], [name], MD;  Location: [place] [facility];  Service: Cardiovascular;  Laterality: N/A;   EYE SURGERY  2012   march and April  --Dr [name]   LEFT HEART CATH AND CORONARY ANGIOGRAPHY N/A 04/29/2017   Procedure: LEFT HEART CATH AND CORONARY ANGIOGRAPHY;  Surgeon: [name], MD;  Location: [place] [facility];  Service: Cardiovascular;  Laterality: N/A;   PROSTATECTOMY     TONSILLECTOMY      Current Medications: Current Meds  Medication Sig   aspirin EC 81 MG tablet Take 1 tablet (81 mg total) by mouth daily  at 12 noon.   atorvastatin (LIPITOR) 80 MG tablet TAKE ONE TABLET BY MOUTH ONCE DAILY   bismuth subsalicylate (PEPTO BISMOL) 262 MG/15ML suspension Take 30 mLs by mouth every 6 (six) hours as needed for indigestion or diarrhea or loose stools.   blood glucose meter kit and supplies KIT Dispense based on patient and insurance preference. Use up to four times daily as directed. (FOR ICD-9 250.00, 250.01).   Calcium Carb-Cholecalciferol (CALCIUM+D3 PO) Take 1 tablet by mouth daily at 12 noon.   carvedilol (COREG) 3.125 MG tablet TAKE ONE TABLET BY MOUTH TWICE DAILY   Docusate Calcium (STOOL SOFTENER PO)  Take 1-2 capsules by mouth at bedtime.   lansoprazole (PREVACID) 30 MG capsule TAKE ONE CAPSULE BY MOUTH AT 12 NOON WITH FOOD   levothyroxine (SYNTHROID) 50 MCG tablet TAKE ONE TABLET BY MOUTH DAILY   MAGNESIUM PO Take 1 tablet by mouth daily at 12 noon.   metFORMIN (GLUCOPHAGE) 500 MG tablet TAKE ONE TABLET (500 MG TOTAL) BY MOUTH TWICE DAILY WITH A MEAL   Multiple Vitamins-Minerals (PRESERVISION AREDS 2) CAPS Take 1 capsule by mouth daily at 12 noon.   NONFORMULARY OR COMPOUNDED ITEM Rolling walker #1  As directed ---- dx peripheral neuropathy, cad , low ext weakness   NONFORMULARY OR COMPOUNDED ITEM Manual wheelchair  #1  As directed   Dx peripheral neuropathy, b/l low ext weakness   Polyethyl Glycol-Propyl Glycol (SYSTANE OP) Place 1 drop into both eyes daily as needed (dry eyes).   Psyllium (METAMUCIL PO) Take 1 Dose by mouth every 3 (three) days.    ranolazine (RANEXA) 500 MG 12 hr tablet TAKE ONE TABLET (500 MG TOTAL) BY MOUTH ONCE DAILY   valACYclovir (VALTREX) 1000 MG tablet Take 1 tablet (1,000 mg total) by mouth 3 (three) times daily.     Allergies:   Penicillins   Social History   Socioeconomic History   Marital status: Married    Spouse name: Not on file   Number of children: 2   Years of education: Not on file   Highest education level: Not on file  Occupational History   Occupation: retired    Fish farm manager: RETIRED  Tobacco Use   Smoking status: Never Smoker   Smokeless tobacco: Never Used  Scientific laboratory technician Use: Never used  Substance and Sexual Activity   Alcohol use: No   Drug use: No   Sexual activity: Not on file  Other Topics Concern   Not on file  Social History Narrative   Not on file   Social Determinants of Health   Financial Resource Strain:    Difficulty of Paying Living Expenses:   Food Insecurity:    Worried About Charity fundraiser in the Last Year:    Arboriculturist in the Last Year:   Transportation Needs:     Film/video editor (Medical):    Lack of Transportation (Non-Medical):   Physical Activity:    Days of Exercise per Week:    Minutes of Exercise per Session:   Stress:    Feeling of Stress :   Social Connections:    Frequency of Communication with Friends and Family:    Frequency of Social Gatherings with Friends and Family:    Attends Religious Services:    Active Member of Clubs or Organizations:    Attends Archivist Meetings:    Marital Status:      Family History: The patient's family history includes Colitis in  his mother; Colon cancer in his brother; Lung cancer in his father; Prostate cancer in his father.  ROS:   Please see the history of present illness.    All other systems reviewed and are negative.  EKGs/Labs/Other Studies Reviewed:    The following studies were reviewed today: EKG reveals sinus rhythm nonspecific ST-T changes   Recent Labs: 09/01/2019: ALT 21; BUN 22; Creatinine, Ser 1.04; Hemoglobin 14.4; Platelets 219.0; Potassium 4.3; Sodium 143; TSH 4.06  Recent Lipid Panel    Component Value Date/Time   CHOL 141 09/01/2019 1045   CHOL 147 09/22/2018 1050   TRIG 127.0 09/01/2019 1045   HDL 50.70 09/01/2019 1045   HDL 41 09/22/2018 1050   CHOLHDL 3 09/01/2019 1045   VLDL 25.4 09/01/2019 1045   LDLCALC 65 09/01/2019 1045   LDLCALC 74 09/22/2018 1050   LDLDIRECT 165.3 10/08/2010 1008    Physical Exam:    VS:  BP (!) 140/82 (BP Location: Left Arm, Patient Position: Sitting, Cuff Size: Normal)    Pulse 70    Ht _0  (1.803 m)    Wt 163 lb (73.9 kg)    SpO2 98%    BMI 22.73 kg/m     Wt Readings from Last 3 Encounters:  02/14/20 163 lb (73.9 kg)  08/10/19 162 lb (73.5 kg)  01/28/19 163 lb 12.8 oz (74.3 kg)     GEN: Patient is in no acute distress HEENT: Normal NECK: No JVD; No carotid bruits LYMPHATICS: No lymphadenopathy CARDIAC: Hear sounds regular, 2/6 systolic murmur at the apex. RESPIRATORY:  Clear to auscultation  without rales, wheezing or rhonchi  ABDOMEN: Soft, non-tender, non-distended MUSCULOSKELETAL:  No edema; No deformity  SKIN: Warm and dry NEUROLOGIC:  Alert and oriented x 3 PSYCHIATRIC:  Normal affect   Signed, Jenean Lindau, MD  02/14/2020 10:51 AM    [place]

## 2020-02-14 NOTE — Progress Notes (Signed)
163lb

## 2020-02-14 NOTE — Patient Instructions (Addendum)
Medication Instructions:  Your physician recommends that you continue on your current medications as directed. Please refer to the Current Medication list given to you today.  *If you need a refill on your cardiac medications before your next appointment, please call your pharmacy*   Lab Work: TODAY:  BMET, CBC, TSH, LIPID, & LFT'S  If you have labs (blood work) drawn today and your tests are completely normal, you will receive your results only by: [name] MyChart Message (if you have MyChart) OR . A paper copy in the mail If you have any lab test that is abnormal or we need to change your treatment, we will call you to review the results.   Testing/Procedures: None ordered   Follow-Up: At [facility], you and your health needs are our priority.  As part of our continuing mission to provide you with exceptional heart care, we have created designated Provider Care Teams.  These Care Teams include your primary Cardiologist (physician) and Advanced Practice Providers (APPs -  Physician Assistants and Nurse Practitioners) who all work together to provide you with the care you need, when you need it.  We recommend signing up for the patient portal called "MyChart".  Sign up information is provided on this After Visit Summary.  MyChart is used to connect with patients for Virtual Visits (Telemedicine).  Patients are able to view lab/test results, encounter notes, upcoming appointments, etc.  Non-urgent messages can be sent to your provider as well.   To learn more about what you can do with MyChart, go to [email or web address].    Your next appointment:   6 month(s)  The format for your next appointment:   In Person  Provider:   Jyl Heinz, MD   Other Instructions

## 2020-02-15 LAB — BASIC METABOLIC PANEL
BUN/Creatinine Ratio: 18 (ref 10–24)
BUN: 22 mg/dL (ref 10–36)
CO2: 22 mmol/L (ref 20–29)
Calcium: 10.1 mg/dL (ref 8.6–10.2)
Chloride: 105 mmol/L (ref 96–106)
Creatinine, Ser: 1.21 mg/dL (ref 0.76–1.27)
GFR calc Af Amer: 59 mL/min/{1.73_m2} — ABNORMAL LOW (ref >59)
GFR calc non Af Amer: 51 mL/min/{1.73_m2} — ABNORMAL LOW (ref >59)
Glucose: 158 mg/dL — ABNORMAL HIGH (ref 65–99)
Potassium: 4.6 mmol/L (ref 3.5–5.2)
Sodium: 141 mmol/L (ref 134–144)

## 2020-02-15 LAB — CBC
Hematocrit: 40.9 % (ref 37.5–51.0)
Hemoglobin: 13.7 g/dL (ref 13.0–17.7)
MCH: 30.9 pg (ref 26.6–33.0)
MCHC: 33.5 g/dL (ref 31.5–35.7)
MCV: 92 fL (ref 79–97)
Platelets: 217 10*3/uL (ref 150–450)
RBC: 4.43 x10E6/uL (ref 4.14–5.80)
RDW: 12 % (ref 11.6–15.4)
WBC: 6 10*3/uL (ref 3.4–10.8)

## 2020-02-15 LAB — LIPID PANEL
Chol/HDL Ratio: 2.7 ratio (ref 0.0–5.0)
Cholesterol, Total: 126 mg/dL (ref 100–199)
HDL: 46 mg/dL (ref >39)
LDL Chol Calc (NIH): 62 mg/dL (ref 0–99)
Triglycerides: 98 mg/dL (ref 0–149)
VLDL Cholesterol Cal: 18 mg/dL (ref 5–40)

## 2020-02-15 LAB — HEPATIC FUNCTION PANEL
ALT: 17 IU/L (ref 0–44)
AST: 14 IU/L (ref 0–40)
Albumin: 4.6 g/dL (ref 3.5–4.6)
Alkaline Phosphatase: 65 IU/L (ref 48–121)
Bilirubin Total: 0.6 mg/dL (ref 0.0–1.2)
Bilirubin, Direct: 0.2 mg/dL (ref 0.00–0.40)
Total Protein: 7 g/dL (ref 6.0–8.5)

## 2020-02-15 LAB — TSH: TSH: 3.75 u[IU]/mL (ref 0.450–4.500)

## 2020-02-22 ENCOUNTER — Other Ambulatory Visit: Payer: Self-pay | Admitting: Family Medicine

## 2020-03-24 ENCOUNTER — Other Ambulatory Visit: Payer: Self-pay | Admitting: Family Medicine

## 2020-04-07 ENCOUNTER — Other Ambulatory Visit: Payer: Self-pay

## 2020-04-07 ENCOUNTER — Encounter: Payer: Self-pay | Admitting: Podiatry

## 2020-04-07 ENCOUNTER — Ambulatory Visit (INDEPENDENT_AMBULATORY_CARE_PROVIDER_SITE_OTHER): Payer: Medicare Other | Admitting: Podiatry

## 2020-04-07 DIAGNOSIS — B351 Tinea unguium: Secondary | ICD-10-CM

## 2020-04-07 DIAGNOSIS — E1142 Type 2 diabetes mellitus with diabetic polyneuropathy: Secondary | ICD-10-CM | POA: Diagnosis not present

## 2020-04-07 DIAGNOSIS — M79674 Pain in right toe(s): Secondary | ICD-10-CM

## 2020-04-07 DIAGNOSIS — M2042 Other hammer toe(s) (acquired), left foot: Secondary | ICD-10-CM

## 2020-04-07 DIAGNOSIS — M79675 Pain in left toe(s): Secondary | ICD-10-CM

## 2020-04-07 DIAGNOSIS — L84 Corns and callosities: Secondary | ICD-10-CM

## 2020-04-07 DIAGNOSIS — M2041 Other hammer toe(s) (acquired), right foot: Secondary | ICD-10-CM

## 2020-04-09 NOTE — Progress Notes (Signed)
Subjective:  Patient ID: Evan Wright, male    DOB: [birth year],  MRN: [id number]  86 y.o. male presents with at risk foot care with history of diabetic neuropathy and painful corn(s) right foot and painful thick toenails that are difficult to trim. Painful toenails interfere with ambulation. Aggravating factors include wearing enclosed shoe gear. Pain is relieved with periodic professional debridement. Painful corns are aggravated when weightbearing when wearing enclosed shoe gear. Pain is relieved with periodic professional debridement.   His daughter is present during today's visit.  Review of Systems: Negative except as noted in the HPI.  Past Medical History:  Diagnosis Date  . Adenomatous colon polyp   . Angina pectoris ([place]) 04/2017  . CAD (coronary artery disease)    a. Cath 04/29/17-> high grade pLAD with R to L collaterals, high grade 1st dig  2. Cath 05/01/17 s/p ostial to mLAD with DES x 2.   . Cardiac murmur   . Coronary artery disease   . Diverticulosis   . GERD (gastroesophageal reflux disease)   . History of hiatal hernia   . Hyperlipidemia   . Loss of height 06/11/2016  . Osteoarthritis   . Prostate cancer [facility])    Past Surgical History:  Procedure Laterality Date  . APPENDECTOMY  1948  . COLONOSCOPY    . CORONARY STENT INTERVENTION N/A 05/01/2017   Procedure: CORONARY STENT INTERVENTION;  Surgeon: [name], [name], MD;  Location: [place] [facility];  Service: Cardiovascular;  Laterality: N/A;  . EYE SURGERY  2012   march and April  --Dr [name]  . LEFT HEART CATH AND CORONARY ANGIOGRAPHY N/A 04/29/2017   Procedure: LEFT HEART CATH AND CORONARY ANGIOGRAPHY;  Surgeon: [name], MD;  Location: [place] [facility];  Service: Cardiovascular;  Laterality: N/A;  . PROSTATECTOMY    . TONSILLECTOMY     Patient Active Problem List   Diagnosis Date Noted  . Slow transit constipation 08/10/2019  . SDH (subdural hematoma) ([place]) 09/28/2018  . Seizure-like activity ([place])  09/28/2018  . Right arm weakness 09/28/2018  . Preventative health care 09/23/2018  . Uncontrolled diabetes mellitus with hyperglycemia ([place]) 09/23/2018  . Osteopenia 09/25/2017  . Essential hypertension 09/25/2017  . Hyperglycemia 09/25/2017  . Hypothyroidism 09/25/2017  . CAD (coronary artery disease), native coronary artery 04/29/2017  . Nonsustained ventricular tachycardia ([place]) 04/24/2017  . Ischemic cardiomyopathy 04/10/2017  . Mixed dyslipidemia 04/10/2017  . Abnormal EKG 04/02/2017  . Idiopathic peripheral neuropathy 10/11/2016  . Proximal leg weakness 10/11/2016  . Weakness of both lower extremities 06/11/2016  . De Quervain's tenosynovitis 05/18/2012  . Family history of malignant neoplasm of gastrointestinal tract 05/14/2011  . NEVI, MULTIPLE 07/12/2010  . OSTEOARTHRITIS 07/12/2010  . UNSPECIFIED VITAMIN D DEFICIENCY 02/11/2008  . HIATAL HERNIA WITH REFLUX 02/11/2008  . GASTROENTERITIS 01/20/2008  . PERSONAL HISTORY OF COLONIC POLYPS 11/17/2007  . DIZZINESS 10/15/2007  . PROSTATE CANCER, HX OF 10/15/2007    Current Outpatient Medications:  .  aspirin EC 81 MG tablet, Take 1 tablet (81 mg total) by mouth daily at 12 noon., Disp: , Rfl:  .  atorvastatin (LIPITOR) 80 MG tablet, TAKE ONE TABLET BY MOUTH ONCE DAILY, Disp: 90 tablet, Rfl: 1 .  bismuth subsalicylate (PEPTO BISMOL) 262 MG/15ML suspension, Take 30 mLs by mouth every 6 (six) hours as needed for indigestion or diarrhea or loose stools., Disp: , Rfl:  .  blood glucose meter kit and supplies KIT, Dispense based on patient and insurance preference. Use up  to four times daily as directed. (FOR ICD-9 250.00, 250.01)., Disp: 1 each, Rfl: 0 .  Calcium Carb-Cholecalciferol (CALCIUM+D3 PO), Take 1 tablet by mouth daily at 12 noon., Disp: , Rfl:  .  carvedilol (COREG) 3.125 MG tablet, TAKE ONE TABLET BY MOUTH TWICE DAILY, Disp: 180 tablet, Rfl: 1 .  Docusate Calcium (STOOL SOFTENER PO), Take 1-2 capsules by mouth at  bedtime., Disp: , Rfl:  .  lansoprazole (PREVACID) 30 MG capsule, Take 1 capsule (30 mg total) by mouth 2 (two) times daily before a meal., Disp: 90 capsule, Rfl: 3 .  levothyroxine (SYNTHROID) 50 MCG tablet, TAKE ONE TABLET BY MOUTH DAILY, Disp: 90 tablet, Rfl: 1 .  MAGNESIUM PO, Take 1 tablet by mouth daily at 12 noon., Disp: , Rfl:  .  metFORMIN (GLUCOPHAGE) 500 MG tablet, TAKE ONE TABLET (500 MG TOTAL) BY MOUTH TWICE DAILY WITH A MEAL, Disp: 180 tablet, Rfl: 0 .  Multiple Vitamins-Minerals (PRESERVISION AREDS 2) CAPS, Take 1 capsule by mouth daily at 12 noon., Disp: , Rfl:  .  nitroGLYCERIN (NITROSTAT) 0.4 MG SL tablet, Place 1 tablet (0.4 mg total) under the tongue every 5 (five) minutes as needed for chest pain., Disp: 25 tablet, Rfl: 11 .  NONFORMULARY OR COMPOUNDED ITEM, Rolling walker #1  As directed ---- dx peripheral neuropathy, cad , low ext weakness, Disp: 1 each, Rfl: 0 .  NONFORMULARY OR COMPOUNDED ITEM, Manual wheelchair  #1  As directed   Dx peripheral neuropathy, b/l low ext weakness, Disp: 1 each, Rfl: 0 .  Polyethyl Glycol-Propyl Glycol (SYSTANE OP), Place 1 drop into both eyes daily as needed (dry eyes)., Disp: , Rfl:  .  Psyllium (METAMUCIL PO), Take 1 Dose by mouth every 3 (three) days. , Disp: , Rfl:  .  ranolazine (RANEXA) 500 MG 12 hr tablet, TAKE ONE TABLET (500 MG TOTAL) BY MOUTH ONCE DAILY, Disp: 180 tablet, Rfl: 0 .  valACYclovir (VALTREX) 1000 MG tablet, Take 1 tablet (1,000 mg total) by mouth 3 (three) times daily., Disp: 21 tablet, Rfl: 0 Allergies  Allergen Reactions  . Penicillins Other (See Comments)    Unknown reaction Did it involve swelling of the face/tongue/throat, SOB, or low BP? Unknown Did it involve sudden or severe rash/hives, skin peeling, or any reaction on the inside of your mouth or nose? Unknown Did you need to seek medical attention at a hospital or doctor's office? Unknown When did it last happen?1940 If all above answers are "NO", may  proceed with cephalosporin use.   Social History   Tobacco Use  Smoking Status Never Smoker  Smokeless Tobacco Never Used   Objective:  There were no vitals filed for this visit. Constitutional Patient is a pleasant 86 y.o. Caucasian male WD, WN in NAD.[name] AAO x 3.  Vascular Capillary fill time to digits <3 seconds b/l lower extremities. Palpable PT pulse(s) b/l lower extremities Faintly palpable DP pulse(s) b/l lower extremities. Pedal hair absent. Lower extremity skin temperature gradient within normal limits. No pain with calf compression b/l. Nonpitting edema noted b/l lower extremities. No ischemia or gangrene noted b/l lower extremities. No cyanosis or clubbing noted.  Neurologic Normal speech. Oriented to person, place, and time. Protective sensation decreased with 10 gram monofilament b/l. Vibratory sensation intact b/l.  Dermatologic Pedal skin is thin shiny, atrophic b/l lower extremities. No open wounds bilaterally. No interdigital macerations bilaterally. Toenails 1-5 b/l elongated, discolored, dystrophic, thickened, crumbly with subungual debris and tenderness to dorsal palpation. Hyperkeratotic lesion(s) R 5th  toe.  No erythema, no edema, no drainage, no flocculence.  Orthopedic: Normal muscle strength 5/5 to all lower extremity muscle groups bilaterally. No pain crepitus or joint limitation noted with ROM b/l. Hallux valgus with bunion deformity noted b/l lower extremities. Hammertoes noted to the 2-5 bilaterally.   Hemoglobin A1C Latest Ref Rng & Units 09/01/2019  HGBA1C 4.6 - 6.5 % 6.9(H)  Some recent data might be hidden   Assessment:   1. Pain due to onychomycosis of toenails of both feet   2. Corns   3. Acquired hammertoes of both feet   4. Diabetic peripheral neuropathy associated with type 2 diabetes mellitus ([place])    Plan:  Patient was evaluated and treated and all questions answered.  Onychomycosis with pain -Nails palliatively debridement as below. -Educated  on self-care  Procedure: Nail Debridement Rationale: Pain Type of Debridement: manual, sharp debridement. Instrumentation: Nail nipper, rotary burr. Number of Nails: 10  -Examined patient. -No new findings. No new orders. -Continue diabetic foot care principles. -Toenails 1-5 b/l were debrided in length and girth with sterile nail nippers and dremel without iatrogenic bleeding.  -Corn(s) R 5th toe pared utilizing sterile scalpel blade without complication or incident. Total number debrided=1. -Patient to report any pedal injuries to medical professional immediately. -Patient to continue soft, supportive shoe gear daily. -Patient/POA to call should there be question/concern in the interim.  Return in about 3 months (around 07/07/2020).  Marzetta Board, DPM

## 2020-04-21 ENCOUNTER — Ambulatory Visit: Payer: Medicare Other | Admitting: Podiatry

## 2020-04-24 ENCOUNTER — Telehealth: Payer: Self-pay | Admitting: Family Medicine

## 2020-04-24 MED ORDER — CARVEDILOL 3.125 MG PO TABS: 3.1250 mg | ORAL_TABLET | Freq: Two times a day (BID) | ORAL | 0 refills | Status: DC

## 2020-04-24 NOTE — Telephone Encounter (Signed)
Refill sent.

## 2020-04-24 NOTE — Telephone Encounter (Signed)
Medication:carvedilol (COREG) 3.125 MG tablet [[id number]]       Has the patient contacted their pharmacy?  (If no, request that the patient contact the pharmacy for the refill.) (If yes, when and what did the pharmacy advise?)     Preferred Pharmacy (with phone number or street name): [place], [place] [facility] [place]  [address] [place] [address]  Phone:  [phone] Fax:  [phone]      Agent: Please be advised that RX refills may take up to 3 business days. We ask that you follow-up with your pharmacy.

## 2020-05-03 ENCOUNTER — Other Ambulatory Visit: Payer: Self-pay | Admitting: Cardiology

## 2020-05-03 MED ORDER — RANOLAZINE ER 500 MG PO TB12: ORAL_TABLET | ORAL | 0 refills | Status: DC

## 2020-05-03 NOTE — Telephone Encounter (Signed)
Refill sent in per request.  

## 2020-05-03 NOTE — Telephone Encounter (Signed)
   *  STAT* If patient is at the pharmacy, call can be transferred to refill team.   1. Which medications need to be refilled? (please list name of each medication and dose if known) ranolazine (RANEXA) 500 MG 12 hr tablet  2. Which pharmacy/location (including street and city if local pharmacy) is medication to be sent to? [place], [place]  3. Do they need a 30 day or 90 day supply? 90 days   Pt needs 90 days supply and he need refill today he is completely out of medication.

## 2020-05-08 ENCOUNTER — Ambulatory Visit: Payer: Medicare Other | Admitting: Neurology

## 2020-05-24 ENCOUNTER — Other Ambulatory Visit: Payer: Self-pay | Admitting: Family Medicine

## 2020-05-24 DIAGNOSIS — E1165 Type 2 diabetes mellitus with hyperglycemia: Secondary | ICD-10-CM

## 2020-06-02 ENCOUNTER — Other Ambulatory Visit: Payer: Self-pay

## 2020-06-02 ENCOUNTER — Encounter: Payer: Self-pay | Admitting: Family Medicine

## 2020-06-02 ENCOUNTER — Ambulatory Visit (INDEPENDENT_AMBULATORY_CARE_PROVIDER_SITE_OTHER): Payer: Medicare Other | Admitting: Family Medicine

## 2020-06-02 VITALS — BP 150/90 | HR 70 | Temp 98.2°F | Resp 20 | Ht 71.0 in | Wt 162.8 lb

## 2020-06-02 DIAGNOSIS — R296 Repeated falls: Secondary | ICD-10-CM | POA: Diagnosis not present

## 2020-06-02 DIAGNOSIS — R351 Nocturia: Secondary | ICD-10-CM

## 2020-06-02 DIAGNOSIS — E1165 Type 2 diabetes mellitus with hyperglycemia: Secondary | ICD-10-CM

## 2020-06-02 DIAGNOSIS — I739 Peripheral vascular disease, unspecified: Secondary | ICD-10-CM

## 2020-06-02 DIAGNOSIS — E039 Hypothyroidism, unspecified: Secondary | ICD-10-CM

## 2020-06-02 DIAGNOSIS — M170 Bilateral primary osteoarthritis of knee: Secondary | ICD-10-CM

## 2020-06-02 DIAGNOSIS — M8580 Other specified disorders of bone density and structure, unspecified site: Secondary | ICD-10-CM

## 2020-06-02 DIAGNOSIS — Z0000 Encounter for general adult medical examination without abnormal findings: Secondary | ICD-10-CM | POA: Diagnosis not present

## 2020-06-02 DIAGNOSIS — Z23 Encounter for immunization: Secondary | ICD-10-CM

## 2020-06-02 DIAGNOSIS — S065X9A Traumatic subdural hemorrhage with loss of consciousness of unspecified duration, initial encounter: Secondary | ICD-10-CM

## 2020-06-02 DIAGNOSIS — R569 Unspecified convulsions: Secondary | ICD-10-CM

## 2020-06-02 DIAGNOSIS — S065XAA Traumatic subdural hemorrhage with loss of consciousness status unknown, initial encounter: Secondary | ICD-10-CM

## 2020-06-02 DIAGNOSIS — I10 Essential (primary) hypertension: Secondary | ICD-10-CM | POA: Diagnosis not present

## 2020-06-02 DIAGNOSIS — R42 Dizziness and giddiness: Secondary | ICD-10-CM | POA: Diagnosis not present

## 2020-06-02 DIAGNOSIS — K582 Mixed irritable bowel syndrome: Secondary | ICD-10-CM

## 2020-06-02 HISTORY — DX: Peripheral vascular disease, unspecified: I73.9

## 2020-06-02 LAB — CBC WITH DIFFERENTIAL/PLATELET
Basophils Absolute: 0 10*3/uL (ref 0.0–0.1)
Basophils Relative: 0.6 % (ref 0.0–3.0)
Eosinophils Absolute: 0.1 10*3/uL (ref 0.0–0.7)
Eosinophils Relative: 1.5 % (ref 0.0–5.0)
HCT: 43.9 % (ref 39.0–52.0)
Hemoglobin: 14.5 g/dL (ref 13.0–17.0)
Lymphocytes Relative: 15 % (ref 12.0–46.0)
Lymphs Abs: 0.8 10*3/uL (ref 0.7–4.0)
MCHC: 33.1 g/dL (ref 30.0–36.0)
MCV: 92.5 fl (ref 78.0–100.0)
Monocytes Absolute: 0.4 10*3/uL (ref 0.1–1.0)
Monocytes Relative: 7.4 % (ref 3.0–12.0)
Neutro Abs: 4.2 10*3/uL (ref 1.4–7.7)
Neutrophils Relative %: 75.5 % (ref 43.0–77.0)
Platelets: 214 10*3/uL (ref 150.0–400.0)
RBC: 4.74 Mil/uL (ref 4.22–5.81)
RDW: 12.9 % (ref 11.5–15.5)
WBC: 5.5 10*3/uL (ref 4.0–10.5)

## 2020-06-02 LAB — COMPREHENSIVE METABOLIC PANEL
ALT: 15 U/L (ref 0–53)
AST: 15 U/L (ref 0–37)
Albumin: 4.5 g/dL (ref 3.5–5.2)
Alkaline Phosphatase: 68 U/L (ref 39–117)
BUN: 17 mg/dL (ref 6–23)
CO2: 30 mEq/L (ref 19–32)
Calcium: 10.1 mg/dL (ref 8.4–10.5)
Chloride: 103 mEq/L (ref 96–112)
Creatinine, Ser: 1.16 mg/dL (ref 0.40–1.50)
GFR: 54.2 mL/min — ABNORMAL LOW (ref >60.00)
Glucose, Bld: 151 mg/dL — ABNORMAL HIGH (ref 70–99)
Potassium: 4.4 mEq/L (ref 3.5–5.1)
Sodium: 141 mEq/L (ref 135–145)
Total Bilirubin: 0.8 mg/dL (ref 0.2–1.2)
Total Protein: 7.4 g/dL (ref 6.0–8.3)

## 2020-06-02 LAB — LIPID PANEL
Cholesterol: 139 mg/dL (ref 0–200)
HDL: 46.3 mg/dL (ref >39.00)
LDL Cholesterol: 64 mg/dL (ref 0–99)
NonHDL: 92.56
Total CHOL/HDL Ratio: 3
Triglycerides: 142 mg/dL (ref 0.0–149.0)
VLDL: 28.4 mg/dL (ref 0.0–40.0)

## 2020-06-02 LAB — VITAMIN D 25 HYDROXY (VIT D DEFICIENCY, FRACTURES): VITD: 24.39 ng/mL — ABNORMAL LOW (ref 30.00–100.00)

## 2020-06-02 LAB — PSA: PSA: 0 ng/mL — ABNORMAL LOW (ref 0.10–4.00)

## 2020-06-02 LAB — MICROALBUMIN / CREATININE URINE RATIO
Creatinine,U: 64 mg/dL
Microalb Creat Ratio: 34.5 mg/g — ABNORMAL HIGH (ref 0.0–30.0)
Microalb, Ur: 22.1 mg/dL — ABNORMAL HIGH (ref 0.0–1.9)

## 2020-06-02 LAB — VITAMIN B12: Vitamin B-12: 194 pg/mL — ABNORMAL LOW (ref 211–911)

## 2020-06-02 LAB — TSH: TSH: 4.67 u[IU]/mL — ABNORMAL HIGH (ref 0.35–4.50)

## 2020-06-02 MED ORDER — LANSOPRAZOLE 30 MG PO CPDR
30.0000 mg | DELAYED_RELEASE_CAPSULE | Freq: Two times a day (BID) | ORAL | 3 refills | Status: DC
Start: 2020-06-02 — End: 2021-09-14

## 2020-06-02 MED ORDER — CARVEDILOL 3.125 MG PO TABS: 3.1250 mg | ORAL_TABLET | Freq: Two times a day (BID) | ORAL | 0 refills | Status: DC

## 2020-06-02 MED ORDER — CARVEDILOL 3.125 MG PO TABS: 3.1250 mg | ORAL_TABLET | Freq: Two times a day (BID) | ORAL | 3 refills | Status: DC

## 2020-06-02 MED ORDER — LEVOTHYROXINE SODIUM 50 MCG PO TABS: 50.0000 ug | ORAL_TABLET | Freq: Every day | ORAL | 1 refills | Status: DC

## 2020-06-02 MED ORDER — METFORMIN HCL 500 MG PO TABS: 500.0000 mg | ORAL_TABLET | Freq: Two times a day (BID) | ORAL | 3 refills | Status: DC

## 2020-06-02 MED ORDER — METFORMIN HCL 500 MG PO TABS: 500.0000 mg | ORAL_TABLET | Freq: Two times a day (BID) | ORAL | 0 refills | Status: DC

## 2020-06-02 NOTE — Progress Notes (Signed)
Patient ID: Evan Wright, male    DOB: 12/05/1924  Age: 86 y.o. MRN: [id number]    Subjective:  Subjective  HPI KHALED HERDA presents for cpe and f/u dm, chol and bp. HYPERTENSION   Blood pressure range-not checking   Chest pain- no      Dyspnea- no Lightheadedness- no   Edema- no  Other side effects - no   Medication compliance: good Low salt diet- yes    DIABETES    Blood Sugar ranges-120-160  Polyuria- no New Visual problems- no  Hypoglycemic symptoms- no  Other side effects-no Medication compliance - good Last eye exam- this year  Foot exam- podiatry    HYPERLIPIDEMIA  Medication compliance- good RUQ pain- no  Muscle aches- no Other side effects-no    Review of Systems  Constitutional: Negative for appetite change, diaphoresis, fatigue and unexpected weight change.  Eyes: Negative for pain, redness and visual disturbance.  Respiratory: Negative for cough, chest tightness, shortness of breath and wheezing.   Cardiovascular: Negative for chest pain, palpitations and leg swelling.  Endocrine: Negative for cold intolerance, heat intolerance, polydipsia, polyphagia and polyuria.  Genitourinary: Negative for difficulty urinating, dysuria and frequency.  Neurological: Negative for dizziness, light-headedness, numbness and headaches.    History Past Medical History:  Diagnosis Date  . Adenomatous colon polyp   . Angina pectoris ([place]) 04/2017  . CAD (coronary artery disease)    a. Cath 04/29/17-> high grade pLAD with R to L collaterals, high grade 1st dig  2. Cath 05/01/17 s/p ostial to mLAD with DES x 2.   . Cardiac murmur   . Coronary artery disease   . Diverticulosis   . GERD (gastroesophageal reflux disease)   . History of hiatal hernia   . Hyperlipidemia   . Loss of height 06/11/2016  . Osteoarthritis   . Prostate cancer [facility])     He has a past surgical history that includes Prostatectomy; Tonsillectomy; Eye surgery (2012); Appendectomy (1948); Colonoscopy;  LEFT HEART CATH AND CORONARY ANGIOGRAPHY (N/A, 04/29/2017); and CORONARY STENT INTERVENTION (N/A, 05/01/2017).   His family history includes Colitis in his mother; Colon cancer in his brother; Lung cancer in his father; Prostate cancer in his father.He reports that he has never smoked. He has never used smokeless tobacco. He reports that he does not drink alcohol and does not use drugs.  Current Outpatient Medications on File Prior to Visit  Medication Sig Dispense Refill  . aspirin EC 81 MG tablet Take 1 tablet (81 mg total) by mouth daily at 12 noon.    [name] atorvastatin (LIPITOR) 80 MG tablet TAKE ONE TABLET BY MOUTH ONCE DAILY 90 tablet 1  . bismuth subsalicylate (PEPTO BISMOL) 262 MG/15ML suspension Take 30 mLs by mouth every 6 (six) hours as needed for indigestion or diarrhea or loose stools.    . blood glucose meter kit and supplies KIT Dispense based on patient and insurance preference. Use up to four times daily as directed. (FOR ICD-9 250.00, 250.01). 1 each 0  . Calcium Carb-Cholecalciferol (CALCIUM+D3 PO) Take 1 tablet by mouth daily at 12 noon.    Mariane Baumgarten Calcium (STOOL SOFTENER PO) Take 1-2 capsules by mouth at bedtime.    [name] MAGNESIUM PO Take 1 tablet by mouth daily at 12 noon.    . Multiple Vitamins-Minerals (PRESERVISION AREDS 2) CAPS Take 1 capsule by mouth daily at 12 noon.    . NONFORMULARY OR COMPOUNDED ITEM Rolling walker #1  As directed ---- dx peripheral neuropathy,  cad , low ext weakness 1 each 0  . NONFORMULARY OR COMPOUNDED ITEM Manual wheelchair  #1  As directed   Dx peripheral neuropathy, b/l low ext weakness 1 each 0  . Polyethyl Glycol-Propyl Glycol (SYSTANE OP) Place 1 drop into both eyes daily as needed (dry eyes).    . Psyllium (METAMUCIL PO) Take 1 Dose by mouth every 3 (three) days.     . ranolazine (RANEXA) 500 MG 12 hr tablet TAKE ONE TABLET (500 MG TOTAL) BY MOUTH ONCE DAILY 180 tablet 0  . valACYclovir (VALTREX) 1000 MG tablet Take 1 tablet (1,000 mg total) by  mouth 3 (three) times daily. 21 tablet 0  . nitroGLYCERIN (NITROSTAT) 0.4 MG SL tablet Place 1 tablet (0.4 mg total) under the tongue every 5 (five) minutes as needed for chest pain. 25 tablet 11   No current facility-administered medications on file prior to visit.     Objective:  Objective  Physical Exam Vitals and nursing note reviewed.  Constitutional:      General: He is not in acute distress.    Appearance: He is well-developed. He is not diaphoretic.  HENT:     Head: Normocephalic and atraumatic.     Right Ear: External ear normal.     Left Ear: External ear normal.     Nose: Nose normal.     Mouth/Throat:     Pharynx: No oropharyngeal exudate.  Eyes:     General:        Right eye: No discharge.        Left eye: No discharge.     Conjunctiva/sclera: Conjunctivae normal.     Pupils: Pupils are equal, round, and reactive to light.  Neck:     Thyroid: No thyromegaly.     Vascular: No JVD.  Cardiovascular:     Rate and Rhythm: Normal rate and regular rhythm.     Heart sounds: No murmur heard.  No friction rub. No gallop.   Pulmonary:     Effort: Pulmonary effort is normal. No respiratory distress.     Breath sounds: Normal breath sounds. No wheezing or rales.  Chest:     Chest wall: No tenderness.  Abdominal:     General: Bowel sounds are normal. There is no distension.     Palpations: Abdomen is soft. There is no mass.     Tenderness: There is no abdominal tenderness. There is no guarding or rebound.  Genitourinary:    Penis: Normal.      Prostate: Normal.     Rectum: Normal. Guaiac result negative.  Musculoskeletal:        General: No tenderness. Normal range of motion.     Cervical back: Normal range of motion and neck supple.  Lymphadenopathy:     Cervical: No cervical adenopathy.  Skin:    General: Skin is warm and dry.     Coloration: Skin is not pale.     Findings: No erythema or rash.  Neurological:     Mental Status: He is alert and oriented to  person, place, and time.     Motor: No abnormal muscle tone.     Deep Tendon Reflexes: Reflexes are normal and symmetric. Reflexes normal.  Psychiatric:        Behavior: Behavior normal.        Thought Content: Thought content normal.        Judgment: Judgment normal.    BP (!) 150/90 (BP Location: Right Arm, Patient Position: Sitting, Cuff Size:  Normal)   Pulse 70   Temp 98.2 F (36.8 C) (Oral)   Resp 20   Ht _0  (1.803 m)   Wt 162 lb 12.8 oz (73.8 kg)   SpO2 98%   BMI 22.71 kg/m  Wt Readings from Last 3 Encounters:  06/02/20 162 lb 12.8 oz (73.8 kg)  02/14/20 163 lb (73.9 kg)  08/10/19 162 lb (73.5 kg)     Lab Results  Component Value Date   WBC 6.0 02/14/2020   HGB 13.7 02/14/2020   HCT 40.9 02/14/2020   PLT 217 02/14/2020   GLUCOSE 158 (H) 02/14/2020   CHOL 126 02/14/2020   TRIG 98 02/14/2020   HDL 46 02/14/2020   LDLDIRECT 165.3 10/08/2010   LDLCALC 62 02/14/2020   ALT 17 02/14/2020   AST 14 02/14/2020   NA 141 02/14/2020   K 4.6 02/14/2020   CL 105 02/14/2020   CREATININE 1.21 02/14/2020   BUN 22 02/14/2020   CO2 22 02/14/2020   TSH 3.750 02/14/2020   PSA 0.01 (L) 06/11/2016   INR 1.0 09/28/2018   HGBA1C 6.9 (H) 09/01/2019   MICROALBUR 11.2 (H) 09/01/2019    ECHOCARDIOGRAM COMPLETE  Result Date: 02/11/2019   ECHOCARDIOGRAM REPORT   Patient Name:   TYRIK STETZER Birks Date of Exam: 02/11/2019 Medical Rec #:  [id number]     Height:       71.0 in Accession #:    [id number]    Weight:       163.8 lb Date of Birth:  03/01/1925      BSA:          1.94 m Patient Age:    76 years      BP:           172/70 mmHg Patient Gender: M             HR:           68 bpm. Exam Location:  [place]  Procedure: 2D Echo Indications:    Chest pain  History:        Patient has prior history of Echocardiogram examinations, most                 recent 04/08/2017. Previous Myocardial Infarction and CAD                 Signs/Symptoms: Murmur Risk Factors: Diabetes and Dyslipidemia.   Sonographer:    Cardell Peach RDCS (AE) Referring Phys: [place]  Sonographer Comments: Technically challenging study due to limited acoustic windows. Image acquisition challenging due to patient body habitus and respiratory motion. IMPRESSIONS  1. The left ventricle has normal systolic function, with an ejection fraction of 55-60%. The cavity size was normal. There is mild concentric left ventricular hypertrophy. Left ventricular diastolic Doppler parameters are consistent with impaired relaxation. Left ventrical global hypokinesis without regional wall motion abnormalities.  2. The right ventricle has normal systolic function. The cavity was normal. There is no increase in right ventricular wall thickness.  3. The mitral valve is degenerative. There is mild mitral annular calcification present. No evidence of mitral valve stenosis.  4. The aortic valve is tricuspid. Mild thickening of the aortic valve. Aortic valve regurgitation is mild to moderate by color flow Doppler. No stenosis of the aortic valve  5. 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 FINDINGS  Left Ventricle: The left ventricle has normal systolic function, with an ejection fraction  of 55-60%. The cavity size was normal. There is mild concentric left ventricular hypertrophy. Left ventricular diastolic Doppler parameters are consistent with impaired relaxation. Normal left ventricular filling pressures Left ventrical global hypokinesis without regional wall motion abnormalities. Right Ventricle: The right ventricle has normal systolic function. The cavity was normal. There is no increase in right ventricular wall thickness. Left Atrium: Left atrial size was normal in size. Right Atrium: Right atrial size was normal in size. Right atrial pressure is estimated at 3 mmHg. Interatrial Septum: No atrial level shunt detected by color flow Doppler. Pericardium: There is no evidence of pericardial  effusion. Mitral Valve: The mitral valve is degenerative in appearance. There is mild mitral annular calcification present. Mitral valve regurgitation is mild by color flow Doppler. No evidence of mitral valve stenosis. Tricuspid Valve: The tricuspid valve is normal in structure. Tricuspid valve regurgitation is mild by color flow Doppler. Aortic Valve: The aortic valve is tricuspid Mild thickening of the aortic valve. Aortic valve regurgitation is mild to moderate by color flow Doppler. There is No stenosis of the aortic valve. Pulmonic Valve: The pulmonic valve was grossly normal. Pulmonic valve regurgitation is not visualized by color flow Doppler. No evidence of pulmonic stenosis. Aorta: The aorta is abnormal in size and structure. 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 Venous: The inferior vena cava measures 1.00 cm, is normal in size with greater than 50% respiratory variability.  +--------------+--------++ LEFT VENTRICLE         +----------------+---------++ +--------------+--------++ Diastology                PLAX 2D                +----------------+---------++ +--------------+--------++ LV e' lateral:  9.03 cm/s LVIDd:        4.56 cm  +----------------+---------++ +--------------+--------++ LV E/e' lateral:8.8       LVIDs:        3.55 cm  +----------------+---------++ +--------------+--------++ LV e' medial:   5.98 cm/s LV PW:        1.20 cm  +----------------+---------++ +--------------+--------++ LV E/e' medial: 13.2      LV IVS:       1.17 cm  +----------------+---------++ +--------------+--------++ LVOT diam:    1.90 cm  +--------------+--------++ LV SV:        43 ml    +--------------+--------++ LV SV Index:  22.14    +--------------+--------++ LVOT Area:    2.84 cm +--------------+--------++                        +--------------+--------++ +---------------+---------++ RIGHT VENTRICLE           +---------------+---------++ RV S prime:    9.46 cm/s +---------------+---------++ TAPSE (M-mode):1.8 cm    +---------------+---------++ +---------------+-------++-----------++ LEFT ATRIUM           Index       +---------------+-------++-----------++ LA diam:       3.30 cm1.70 cm/m  +---------------+-------++-----------++ LA Vol (A2C):  46.6 ml24.06 ml/m +---------------+-------++-----------++ LA Vol (A4C):  42.4 ml21.89 ml/m +---------------+-------++-----------++ LA Biplane Vol:48.6 ml25.09 ml/m +---------------+-------++-----------++ +------------+---------++-----------++ RIGHT ATRIUM         Index       +------------+---------++-----------++ RA Area:    13.40 cm            +------------+---------++-----------++ RA Volume:  32.40 ml 16.73 ml/m +------------+---------++-----------++  +------------+-----------++ AORTIC VALVE            +------------+-----------++ LVOT Vmax:  130.00  cm/s +------------+-----------++ LVOT Vmean: 94.200 cm/s +------------+-----------++ LVOT VTI:   0.288 m     +------------+-----------++ AR PHT:     362 msec    +------------+-----------++  +-------------+-------++ AORTA                +-------------+-------++ Ao Root diam:2.70 cm +-------------+-------++ Ao Asc diam: 4.40 cm +-------------+-------++ +--------------+----------++  +---------------+-----------++ MITRAL VALVE              TRICUSPID VALVE            +--------------+----------++  +---------------+-----------++ MV Area (PHT):2.79 cm    TR Peak grad:  19.4 mmHg   +--------------+----------++  +---------------+-----------++ MV Peak grad: 6.2 mmHg    TR Vmax:       272.00 cm/s +--------------+----------++  +---------------+-----------++ MV Mean grad: 2.0 mmHg   +--------------+----------++  +--------------+-------+ MV Vmax:      1.24 m/s    SHUNTS                +--------------+----------++   +--------------+-------+ MV Vmean:     66.1 cm/s   Systemic VTI: 0.29 m  +--------------+----------++  +--------------+-------+ MV VTI:       0.37 m      Systemic Diam:1.90 cm +--------------+----------++  +--------------+-------+ MV PHT:       78.88 msec +--------------+----------++ MV Decel Time:272 msec   +--------------+----------++ +--------------+-----------++ MV E velocity:79.20 cm/s  +--------------+-----------++ MV A velocity:107.00 cm/s +--------------+-----------++ MV E/A ratio: 0.74        +--------------+-----------++ +---------+-------+ IVC              +---------+-------+ IVC diam:1.00 cm +---------+-------+  Shirlee More MD Electronically signed by Shirlee More MD Signature Date/Time: 02/11/2019/12:25:02 PM    Final      Assessment & Plan:  Plan  I have changed Jerrid W. Barile's levothyroxine. I am also having him maintain his Psyllium (METAMUCIL PO), nitroGLYCERIN, PreserVision AREDS 2, Calcium Carb-Cholecalciferol (CALCIUM+D3 PO), MAGNESIUM PO, Polyethyl Glycol-Propyl Glycol (SYSTANE OP), bismuth subsalicylate, Docusate Calcium (STOOL SOFTENER PO), aspirin EC, blood glucose meter kit and supplies, atorvastatin, NONFORMULARY OR COMPOUNDED ITEM, NONFORMULARY OR COMPOUNDED ITEM, valACYclovir, ranolazine, metFORMIN, carvedilol, and lansoprazole.  Meds ordered this encounter  Medications  . DISCONTD: carvedilol (COREG) 3.125 MG tablet    Sig: Take 1 tablet (3.125 mg total) by mouth 2 (two) times daily with a meal.    Dispense:  60 tablet    Refill:  0    Requested drug refills are authorized, however, the patient needs further evaluation and/or laboratory testing before further refills are given. Ask him to make an appointment for this.  [name] DISCONTD: metFORMIN (GLUCOPHAGE) 500 MG tablet    Sig: Take 1 tablet (500 mg total) by mouth 2 (two) times daily with a meal.    Dispense:  60 tablet    Refill:  0    Requested drug refills are authorized,  however, the patient needs further evaluation and/or laboratory testing before further refills are given. Ask him to make an appointment for this.  . metFORMIN (GLUCOPHAGE) 500 MG tablet    Sig: Take 1 tablet (500 mg total) by mouth 2 (two) times daily with a meal.    Dispense:  180 tablet    Refill:  3    Requested drug refills are authorized, however, the patient needs further evaluation and/or laboratory testing before further refills are given. Ask him to make an appointment for this.  . carvedilol (COREG) 3.125 MG tablet    Sig: Take 1 tablet (3.125 mg  total) by mouth 2 (two) times daily with a meal.    Dispense:  180 tablet    Refill:  3    Requested drug refills are authorized, however, the patient needs further evaluation and/or laboratory testing before further refills are given. Ask him to make an appointment for this.  [name] levothyroxine (SYNTHROID) 50 MCG tablet    Sig: Take 1 tablet (50 mcg total) by mouth daily.    Dispense:  90 tablet    Refill:  1  . lansoprazole (PREVACID) 30 MG capsule    Sig: Take 1 capsule (30 mg total) by mouth 2 (two) times daily before a meal.    Dispense:  90 capsule    Refill:  3    Problem List Items Addressed This Visit      High   Hypothyroidism   Relevant Medications   carvedilol (COREG) 3.125 MG tablet   levothyroxine (SYNTHROID) 50 MCG tablet   Other Relevant Orders   Lipid panel   Hemoglobin A1c   Comprehensive metabolic panel   CBC with Differential/Platelet   TSH   Osteopenia   Relevant Orders   DG Bone Density   Ambulatory referral to Physical Therapy   Preventative health care - Primary   Uncontrolled diabetes mellitus with hyperglycemia (HCC)   Relevant Medications   metFORMIN (GLUCOPHAGE) 500 MG tablet   Other Relevant Orders   Hemoglobin A1c   Comprehensive metabolic panel   CBC with Differential/Platelet   Microalbumin / creatinine urine ratio     Unprioritized   Intermittent claudication (HCC)   SDH (subdural  hematoma) (HCC)   Relevant Orders   MR Brain Wo Contrast    Other Visit Diagnoses    Need for influenza vaccination       Relevant Orders   Flu Vaccine QUAD High Dose(Fluad)   Primary hypertension       Relevant Medications   carvedilol (COREG) 3.125 MG tablet   Other Relevant Orders   Lipid panel   Comprehensive metabolic panel   CBC with Differential/Platelet   Convulsions, unspecified convulsion type (HCC)   (Chronic)     Nocturia       Relevant Orders   PSA   Dizziness       Relevant Orders   MR Brain Wo Contrast   Ambulatory referral to Physical Therapy   Vitamin B12   Vitamin D (25 hydroxy)   Frequent falls       Relevant Orders   Ambulatory referral to Physical Therapy   Vitamin B12   Vitamin D (25 hydroxy)   Osteoarthritis of both knees, unspecified osteoarthritis type       Relevant Orders   Ambulatory referral to Physical Therapy   Irritable bowel syndrome with both constipation and diarrhea       Relevant Medications   lansoprazole (PREVACID) 30 MG capsule   Other Relevant Orders   Ambulatory referral to Gastroenterology      Follow-up: No follow-ups on file.  [name], DO

## 2020-06-02 NOTE — Patient Instructions (Signed)

## 2020-06-05 LAB — HEMOGLOBIN A1C: Hgb A1c MFr Bld: 7.3 % — ABNORMAL HIGH (ref 4.6–6.5)

## 2020-06-07 ENCOUNTER — Other Ambulatory Visit: Payer: Self-pay | Admitting: Family Medicine

## 2020-06-07 DIAGNOSIS — E1165 Type 2 diabetes mellitus with hyperglycemia: Secondary | ICD-10-CM

## 2020-06-07 DIAGNOSIS — E538 Deficiency of other specified B group vitamins: Secondary | ICD-10-CM

## 2020-06-07 DIAGNOSIS — E559 Vitamin D deficiency, unspecified: Secondary | ICD-10-CM

## 2020-06-09 ENCOUNTER — Ambulatory Visit: Payer: Medicare Other | Attending: Internal Medicine

## 2020-06-09 ENCOUNTER — Other Ambulatory Visit (HOSPITAL_BASED_OUTPATIENT_CLINIC_OR_DEPARTMENT_OTHER): Payer: Self-pay | Admitting: Internal Medicine

## 2020-06-09 DIAGNOSIS — Z23 Encounter for immunization: Secondary | ICD-10-CM

## 2020-06-14 ENCOUNTER — Ambulatory Visit (HOSPITAL_COMMUNITY): Payer: Medicare Other

## 2020-06-26 ENCOUNTER — Telehealth: Payer: Self-pay | Admitting: Family Medicine

## 2020-06-26 MED ORDER — VITAMIN D (ERGOCALCIFEROL) 1.25 MG (50000 UNIT) PO CAPS: 50000.0000 [IU] | ORAL_CAPSULE | ORAL | 1 refills | Status: DC

## 2020-06-26 MED ORDER — LEVOTHYROXINE SODIUM 100 MCG PO TABS: 100.0000 ug | ORAL_TABLET | Freq: Every day | ORAL | 2 refills | Status: DC

## 2020-06-26 NOTE — Telephone Encounter (Signed)
Medications sent to the pharmacy.

## 2020-06-26 NOTE — Telephone Encounter (Signed)
Caller: [facility]   Caller states patient needs the 100 MG  Pharmacy has the 50 MG     Medication:levothyroxine (SYNTHROID) 50 MCG tablet [[id number]]      Has the patient contacted their pharmacy? yes (If no, request that the patient contact the pharmacy for the refill.) (If yes, when and what did the pharmacy advise?)    Preferred Pharmacy (with phone number or street name): [facility] [phone]      Agent: Please be advised that RX refills may take up to 3 business days. We ask that you follow-up with your pharmacy.

## 2020-07-03 ENCOUNTER — Ambulatory Visit: Payer: Medicare Other | Admitting: Podiatry

## 2020-08-01 ENCOUNTER — Other Ambulatory Visit (HOSPITAL_BASED_OUTPATIENT_CLINIC_OR_DEPARTMENT_OTHER): Payer: Medicare Other

## 2020-08-03 ENCOUNTER — Other Ambulatory Visit (HOSPITAL_BASED_OUTPATIENT_CLINIC_OR_DEPARTMENT_OTHER): Payer: Medicare Other

## 2020-08-15 ENCOUNTER — Ambulatory Visit: Payer: Medicare Other | Admitting: Podiatry

## 2020-08-25 ENCOUNTER — Other Ambulatory Visit: Payer: Self-pay | Admitting: Cardiology

## 2020-08-25 NOTE — Telephone Encounter (Signed)
Refill sent to pharmacy.   

## 2020-09-05 ENCOUNTER — Telehealth: Payer: Self-pay | Admitting: Family Medicine

## 2020-09-05 ENCOUNTER — Telehealth: Payer: Self-pay | Admitting: Cardiology

## 2020-09-05 ENCOUNTER — Ambulatory Visit (HOSPITAL_BASED_OUTPATIENT_CLINIC_OR_DEPARTMENT_OTHER)
Admission: RE | Admit: 2020-09-05 | Discharge: 2020-09-05 | Disposition: A | Discharge status: Home / Self Care | Payer: Medicare Other | Source: Ambulatory Visit | Attending: Family Medicine | Admitting: Family Medicine

## 2020-09-05 ENCOUNTER — Other Ambulatory Visit: Payer: Self-pay

## 2020-09-05 DIAGNOSIS — Z13820 Encounter for screening for osteoporosis: Secondary | ICD-10-CM | POA: Diagnosis not present

## 2020-09-05 DIAGNOSIS — M8580 Other specified disorders of bone density and structure, unspecified site: Secondary | ICD-10-CM

## 2020-09-05 DIAGNOSIS — E039 Hypothyroidism, unspecified: Secondary | ICD-10-CM | POA: Insufficient documentation

## 2020-09-05 DIAGNOSIS — M810 Age-related osteoporosis without current pathological fracture: Secondary | ICD-10-CM | POA: Diagnosis not present

## 2020-09-05 DIAGNOSIS — M85859 Other specified disorders of bone density and structure, unspecified thigh: Secondary | ICD-10-CM | POA: Diagnosis not present

## 2020-09-05 MED ORDER — ATORVASTATIN CALCIUM 80 MG PO TABS: 80.0000 mg | ORAL_TABLET | Freq: Every day | ORAL | 0 refills | Status: DC

## 2020-09-05 MED ORDER — LEVOTHYROXINE SODIUM 100 MCG PO TABS: 100.0000 ug | ORAL_TABLET | Freq: Every day | ORAL | 0 refills | Status: DC

## 2020-09-05 NOTE — Telephone Encounter (Signed)
Refill sent.

## 2020-09-05 NOTE — Telephone Encounter (Signed)
Pt's daughter came in office stating pt is needing his meds requested for 90 days supply for levothyroxine (SYNTHROID) 100 MCG tablet. Pt has refills left but is needing to have it changed to 90 days supply if possible. [place], [place] - [place]  [address] [place] [address]  Phone:  [phone] Fax:  [phone]  Please advise.

## 2020-09-05 NOTE — Telephone Encounter (Signed)
Patients wife came in the office on behalf of her husband asking for a refill on his Lipitor please send into [facility]. CB # [phone]

## 2020-09-06 ENCOUNTER — Other Ambulatory Visit: Payer: Self-pay

## 2020-09-06 MED ORDER — ALENDRONATE-CHOLECALCIFEROL 70-5600 MG-UNIT PO TABS: 1.0000 | ORAL_TABLET | ORAL | 3 refills | Status: DC

## 2020-09-07 DIAGNOSIS — H04123 Dry eye syndrome of bilateral lacrimal glands: Secondary | ICD-10-CM | POA: Diagnosis not present

## 2020-09-07 DIAGNOSIS — E119 Type 2 diabetes mellitus without complications: Secondary | ICD-10-CM | POA: Diagnosis not present

## 2020-09-07 DIAGNOSIS — H02055 Trichiasis without entropian left lower eyelid: Secondary | ICD-10-CM | POA: Diagnosis not present

## 2020-09-07 DIAGNOSIS — H353132 Nonexudative age-related macular degeneration, bilateral, intermediate dry stage: Secondary | ICD-10-CM | POA: Diagnosis not present

## 2020-09-12 DIAGNOSIS — K219 Gastro-esophageal reflux disease without esophagitis: Secondary | ICD-10-CM | POA: Insufficient documentation

## 2020-09-12 DIAGNOSIS — Z8719 Personal history of other diseases of the digestive system: Secondary | ICD-10-CM | POA: Insufficient documentation

## 2020-09-12 DIAGNOSIS — E785 Hyperlipidemia, unspecified: Secondary | ICD-10-CM | POA: Insufficient documentation

## 2020-09-12 DIAGNOSIS — K5790 Diverticulosis of intestine, part unspecified, without perforation or abscess without bleeding: Secondary | ICD-10-CM | POA: Insufficient documentation

## 2020-09-12 DIAGNOSIS — C61 Malignant neoplasm of prostate: Secondary | ICD-10-CM | POA: Insufficient documentation

## 2020-09-12 DIAGNOSIS — I2510 Atherosclerotic heart disease of native coronary artery without angina pectoris: Secondary | ICD-10-CM | POA: Insufficient documentation

## 2020-09-12 DIAGNOSIS — D126 Benign neoplasm of colon, unspecified: Secondary | ICD-10-CM | POA: Insufficient documentation

## 2020-09-12 DIAGNOSIS — M1990 Unspecified osteoarthritis, unspecified site: Secondary | ICD-10-CM | POA: Insufficient documentation

## 2020-09-12 DIAGNOSIS — E1169 Type 2 diabetes mellitus with other specified complication: Secondary | ICD-10-CM | POA: Insufficient documentation

## 2020-09-12 DIAGNOSIS — R011 Cardiac murmur, unspecified: Secondary | ICD-10-CM | POA: Insufficient documentation

## 2020-09-14 ENCOUNTER — Ambulatory Visit (INDEPENDENT_AMBULATORY_CARE_PROVIDER_SITE_OTHER): Payer: Medicare Other | Admitting: Cardiology

## 2020-09-14 ENCOUNTER — Other Ambulatory Visit: Payer: Self-pay

## 2020-09-14 ENCOUNTER — Encounter: Payer: Self-pay | Admitting: Cardiology

## 2020-09-14 VITALS — BP 158/88 | HR 70 | Ht 71.0 in | Wt 163.1 lb

## 2020-09-14 DIAGNOSIS — I10 Essential (primary) hypertension: Secondary | ICD-10-CM | POA: Diagnosis not present

## 2020-09-14 DIAGNOSIS — E782 Mixed hyperlipidemia: Secondary | ICD-10-CM

## 2020-09-14 DIAGNOSIS — I4729 Other ventricular tachycardia: Secondary | ICD-10-CM

## 2020-09-14 DIAGNOSIS — I472 Ventricular tachycardia: Secondary | ICD-10-CM

## 2020-09-14 DIAGNOSIS — I2510 Atherosclerotic heart disease of native coronary artery without angina pectoris: Secondary | ICD-10-CM

## 2020-09-14 DIAGNOSIS — I255 Ischemic cardiomyopathy: Secondary | ICD-10-CM

## 2020-09-14 DIAGNOSIS — M810 Age-related osteoporosis without current pathological fracture: Secondary | ICD-10-CM

## 2020-09-14 MED ORDER — ALENDRONATE SODIUM 70 MG PO TABS: 70.0000 mg | ORAL_TABLET | ORAL | 3 refills | Status: DC

## 2020-09-14 NOTE — Progress Notes (Signed)
Cardiology Office Note:    Date:  09/14/2020   ID:  Evan Wright, DOB Jun 26, 1925, MRN [id number]  PCP:  Carollee Herter, Alferd Apa, DO  Cardiologist:  Jenean Lindau, MD   Referring MD: Carollee Herter, Alferd Apa, *    ASSESSMENT:    1. Ischemic cardiomyopathy   2. Nonsustained ventricular tachycardia ([place])   3. Coronary artery disease involving native coronary artery of native heart without angina pectoris   4. Essential hypertension   5. Mixed hyperlipidemia    PLAN:    In order of problems listed above:  1. Coronary artery disease: Secondary prevention stressed with the patient.  Importance of compliance with diet medication stressed any vocalized understanding.  He was advised to ambulate age appropriately. 2. Essential hypertension: Blood pressure is elevated but the daughter mentions to me that home blood pressures are better.  I would avoid elevated and strict blood pressure control in this patient for fear of hypotension and consequent adverse issues 3. Diabetes mellitus: Managed by primary care physician.  Last hemoglobin A1c is greater than 7 and I cautioned him against this. 4. Mixed dyslipidemia: Diet was emphasized.  Lipids from November are fine and I educated the patient about those numbers. 5. Patient will be seen in follow-up appointment in 6 months or earlier if the patient has any concerns    Medication Adjustments/Labs and Tests Ordered: Current medicines are reviewed at length with the patient today.  Concerns regarding medicines are outlined above.  No orders of the defined types were placed in this encounter.  No orders of the defined types were placed in this encounter.    No chief complaint on file.    History of Present Illness:    Evan Wright is a 87 y.o. male.  Patient has past medical history of coronary artery disease, essential hypertension, dyslipidemia and diabetes mellitus.  He denies any problems at this time and takes care of activities of  daily living.  He has a very supportive daughter.  No chest pain orthopnea or PND.  His wife accompanies him for this visit.  He ambulates age appropriately.  At the time of my evaluation, the patient is alert awake oriented and in no distress.  Past Medical History:  Diagnosis Date  . Abnormal EKG 04/02/2017  . Adenomatous colon polyp   . Angina pectoris ([place]) 04/2017  . CAD (coronary artery disease)    a. Cath 04/29/17-> high grade pLAD with R to L collaterals, high grade 1st dig  2. Cath 05/01/17 s/p ostial to mLAD with DES x 2.   . CAD (coronary artery disease), native coronary artery 04/29/2017  . Cardiac murmur   . Coronary artery disease   . De Quervain's tenosynovitis 05/18/2012  . Diverticulosis   . DIZZINESS 10/15/2007   Qualifier: Diagnosis of  By: [name]    . Essential hypertension 09/25/2017  . Family history of malignant neoplasm of gastrointestinal tract 05/14/2011   Brother with colon cancer   . GASTROENTERITIS 01/20/2008   Qualifier: Diagnosis of  By: [name], [name]    . GERD (gastroesophageal reflux disease)   . HIATAL HERNIA WITH REFLUX 02/11/2008   Qualifier: Diagnosis of  By: [name]    . History of hiatal hernia   . Hyperglycemia 09/25/2017  . Hyperlipidemia   . Hypothyroidism 09/25/2017  . Idiopathic peripheral neuropathy 10/11/2016  . Intermittent claudication ([place]) 06/02/2020  . Ischemic cardiomyopathy 04/10/2017  . Loss of height 06/11/2016  .  Mixed dyslipidemia 04/10/2017  . NEVI, MULTIPLE 07/12/2010   Qualifier: Diagnosis of  By: [name]    . Nonsustained ventricular tachycardia ([place]) 04/24/2017  . Osteoarthritis   . OSTEOARTHRITIS 07/12/2010   Qualifier: Diagnosis of  By: [name]    . Osteopenia 09/25/2017  . Personal history of colonic polyps 11/17/2007   Qualifier: Diagnosis of  By: [name], [name]   . Preventative health care 09/23/2018  . Prostate cancer ([place])   . PROSTATE CANCER, HX OF 10/15/2007   Qualifier:  Diagnosis of  By: [name]    . Proximal leg weakness 10/11/2016  . Right arm weakness 09/28/2018  . SDH (subdural hematoma) ([place]) 09/28/2018  . Seizure-like activity ([place]) 09/28/2018  . Slow transit constipation 08/10/2019  . Uncontrolled diabetes mellitus with hyperglycemia ([place]) 09/23/2018  . Unspecified vitamin D deficiency 02/11/2008   Qualifier: Diagnosis of  By: [name]    . Weakness of both lower extremities 06/11/2016    Past Surgical History:  Procedure Laterality Date  . APPENDECTOMY  1948  . COLONOSCOPY    . CORONARY STENT INTERVENTION N/A 05/01/2017   Procedure: CORONARY STENT INTERVENTION;  Surgeon: [name], [name], MD;  Location: [facility];  Service: Cardiovascular;  Laterality: N/A;  . EYE SURGERY  2012   march and April  --Dr [name]  . LEFT HEART CATH AND CORONARY ANGIOGRAPHY N/A 04/29/2017   Procedure: LEFT HEART CATH AND CORONARY ANGIOGRAPHY;  Surgeon: [name], MD;  Location: [facility];  Service: Cardiovascular;  Laterality: N/A;  . PROSTATECTOMY    . TONSILLECTOMY      Current Medications: Current Meds  Medication Sig  . aspirin EC 81 MG tablet Take 1 tablet (81 mg total) by mouth daily at 12 noon.  [name] atorvastatin (LIPITOR) 80 MG tablet Take 1 tablet (80 mg total) by mouth daily.  . Calcium Carb-Cholecalciferol (CALCIUM+D3 PO) Take 1 tablet by mouth daily at 12 noon.  . carvedilol (COREG) 3.125 MG tablet Take 1 tablet (3.125 mg total) by mouth 2 (two) times daily with a meal.  . lansoprazole (PREVACID) 30 MG capsule Take 1 capsule (30 mg total) by mouth 2 (two) times daily before a meal.  . levothyroxine (SYNTHROID) 100 MCG tablet Take 1 tablet (100 mcg total) by mouth daily before breakfast. Pt due for office visit  . MAGNESIUM PO Take 1 tablet by mouth daily at 12 noon.  . metFORMIN (GLUCOPHAGE) 500 MG tablet Take 1 tablet (500 mg total) by mouth 2 (two) times daily with a meal.  . Multiple Vitamins-Minerals (PRESERVISION AREDS  2) CAPS Take 1 capsule by mouth daily at 12 noon.  . nitroGLYCERIN (NITROSTAT) 0.4 MG SL tablet Place 0.4 mg under the tongue every 5 (five) minutes as needed for chest pain.  Vladimir Faster Glycol-Propyl Glycol (SYSTANE OP) Place 1 drop into both eyes daily as needed (dry eyes).  . Psyllium (METAMUCIL PO) Take 1 Dose by mouth every 3 (three) days.   . ranolazine (RANEXA) 500 MG 12 hr tablet TAKE ONE TABLET (500 MG TOTAL) BY MOUTH ONCE DAILY  . Vitamin D, Ergocalciferol, (DRISDOL) 1.25 MG (50000 UNIT) CAPS capsule Take 1 capsule (50,000 Units total) by mouth every 7 (seven) days.     Allergies:   Penicillins   Social History   Socioeconomic History  . Marital status: Married    Spouse name: Not on file  . Number of children: 2  . Years of  education: Not on file  . Highest education level: Not on file  Occupational History  . Occupation: retired    Fish farm manager: RETIRED  Tobacco Use  . Smoking status: Never Smoker  . Smokeless tobacco: Never Used  Vaping Use  . Vaping Use: Never used  Substance and Sexual Activity  . Alcohol use: No  . Drug use: No  . Sexual activity: Not on file  Other Topics Concern  . Not on file  Social History Narrative  . Not on file   Social Determinants of Health   Financial Resource Strain: Not on file  Food Insecurity: Not on file  Transportation Needs: Not on file  Physical Activity: Not on file  Stress: Not on file  Social Connections: Not on file     Family History: The patient's family history includes Colitis in his mother; Colon cancer in his brother; Lung cancer in his father; Prostate cancer in his father.  ROS:   Please see the history of present illness.    All other systems reviewed and are negative.  EKGs/Labs/Other Studies Reviewed:    The following studies were reviewed today: I discussed my findings with the patient at extensive length.   Recent Labs: 06/02/2020: ALT 15; BUN 17; Creatinine, Ser 1.16; Hemoglobin 14.5;  Platelets 214.0; Potassium 4.4; Sodium 141; TSH 4.67  Recent Lipid Panel    Component Value Date/Time   CHOL 139 06/02/2020 1117   CHOL 126 02/14/2020 1101   TRIG 142.0 06/02/2020 1117   HDL 46.30 06/02/2020 1117   HDL 46 02/14/2020 1101   CHOLHDL 3 06/02/2020 1117   VLDL 28.4 06/02/2020 1117   LDLCALC 64 06/02/2020 1117   LDLCALC 62 02/14/2020 1101   LDLDIRECT 165.3 10/08/2010 1008    Physical Exam:    VS:  BP (!) 158/88   Pulse 70   Ht 5\' 11"  (1.803 m)   Wt 163 lb 1.3 oz (74 kg)   SpO2 99%   BMI 22.75 kg/m     Wt Readings from Last 3 Encounters:  09/14/20 163 lb 1.3 oz (74 kg)  06/02/20 162 lb 12.8 oz (73.8 kg)  02/14/20 163 lb (73.9 kg)     GEN: Patient is in no acute distress HEENT: Normal NECK: No JVD; No carotid bruits LYMPHATICS: No lymphadenopathy CARDIAC: Hear sounds regular, 2/6 systolic murmur at the apex. RESPIRATORY:  Clear to auscultation without rales, wheezing or rhonchi  ABDOMEN: Soft, non-tender, non-distended MUSCULOSKELETAL:  No edema; No deformity  SKIN: Warm and dry NEUROLOGIC:  Alert and oriented x 3 PSYCHIATRIC:  Normal affect   Signed, Jenean Lindau, MD  09/14/2020 2:38 PM    [place]

## 2020-09-14 NOTE — Patient Instructions (Signed)

## 2020-09-26 ENCOUNTER — Other Ambulatory Visit: Payer: Self-pay

## 2020-09-26 ENCOUNTER — Ambulatory Visit: Payer: Medicare Other | Admitting: Podiatry

## 2020-09-26 ENCOUNTER — Encounter: Payer: Self-pay | Admitting: Podiatry

## 2020-09-26 DIAGNOSIS — E1142 Type 2 diabetes mellitus with diabetic polyneuropathy: Secondary | ICD-10-CM

## 2020-09-26 DIAGNOSIS — M79675 Pain in left toe(s): Secondary | ICD-10-CM

## 2020-09-26 DIAGNOSIS — M79674 Pain in right toe(s): Secondary | ICD-10-CM | POA: Diagnosis not present

## 2020-09-26 DIAGNOSIS — L84 Corns and callosities: Secondary | ICD-10-CM

## 2020-09-26 DIAGNOSIS — B351 Tinea unguium: Secondary | ICD-10-CM

## 2020-09-26 DIAGNOSIS — M2041 Other hammer toe(s) (acquired), right foot: Secondary | ICD-10-CM

## 2020-09-26 DIAGNOSIS — M2042 Other hammer toe(s) (acquired), left foot: Secondary | ICD-10-CM

## 2020-10-01 NOTE — Progress Notes (Signed)
Subjective:  Patient ID: Evan Wright, male    DOB: 1924/09/26,  MRN: [id number]  87 y.o. male presents with at risk foot care with history of diabetic neuropathy and painful corn(s) right foot and painful thick toenails that are difficult to trim. Painful toenails interfere with ambulation. Aggravating factors include wearing enclosed shoe gear. Pain is relieved with periodic professional debridement. Painful corns are aggravated when weightbearing when wearing enclosed shoe gear. Pain is relieved with periodic professional debridement.   His daughter is present during today's visit. Glucose was 187 mg/dl one week ago.   Review of Systems: Negative except as noted in the HPI.   Allergies  Allergen Reactions  . Penicillins Other (See Comments)    Unknown reaction Did it involve swelling of the face/tongue/throat, SOB, or low BP? Unknown Did it involve sudden or severe rash/hives, skin peeling, or any reaction on the inside of your mouth or nose? Unknown Did you need to seek medical attention at a hospital or doctor's office? Unknown When did it last happen?1940 If all above answers are "NO", may proceed with cephalosporin use.   Social History   Tobacco Use  Smoking Status Never Smoker  Smokeless Tobacco Never Used   Objective:  There were no vitals filed for this visit. Constitutional Patient is a pleasant 87 y.o. Caucasian male WD, WN in NAD.[name] AAO x 3.  Vascular Capillary fill time to digits <3 seconds b/l lower extremities. Palpable PT pulse(s) b/l lower extremities Faintly palpable DP pulse(s) b/l lower extremities. Pedal hair absent. Lower extremity skin temperature gradient within normal limits. No pain with calf compression b/l. Nonpitting edema noted b/l lower extremities. No ischemia or gangrene noted b/l lower extremities. No cyanosis or clubbing noted.  Neurologic Normal speech. Oriented to person, place, and time. Protective sensation decreased with 10 gram  monofilament b/l. Vibratory sensation intact b/l.  Dermatologic Pedal skin is thin shiny, atrophic b/l lower extremities. No open wounds bilaterally. No interdigital macerations bilaterally. Toenails 1-5 b/l elongated, discolored, dystrophic, thickened, crumbly with subungual debris and tenderness to dorsal palpation. Hyperkeratotic lesion(s) R 5th toe.  No erythema, no edema, no drainage, no flocculence.  Orthopedic: Normal muscle strength 5/5 to all lower extremity muscle groups bilaterally. No pain crepitus or joint limitation noted with ROM b/l. Hallux valgus with bunion deformity noted b/l lower extremities. Hammertoes noted to the 2-5 bilaterally.   Hemoglobin A1C Latest Ref Rng & Units 06/02/2020  HGBA1C 4.6 - 6.5 % 7.3(H)  Some recent data might be hidden   Assessment:   1. Pain due to onychomycosis of toenails of both feet   2. Corns   3. Acquired hammertoes of both feet   4. Diabetic peripheral neuropathy associated with type 2 diabetes mellitus ([place])    Plan:  Patient was evaluated and treated and all questions answered.  Onychomycosis with pain -Nails palliatively debridement as below. -Educated on self-care  Procedure: Nail Debridement Rationale: Pain Type of Debridement: manual, sharp debridement. Instrumentation: Nail nipper, rotary burr. Number of Nails: 10  -Examined patient. -No new findings. No new orders. -Continue diabetic foot care principles. -Toenails 1-5 b/l were debrided in length and girth with sterile nail nippers and dremel without iatrogenic bleeding.  -Corn(s) R 5th toe pared utilizing sterile scalpel blade without complication or incident. Total number debrided=1. -Patient to report any pedal injuries to medical professional immediately. -Patient to continue soft, supportive shoe gear daily. -Patient/POA to call should there be question/concern in the interim.  Return in about 3  months (around 12/27/2020).  Marzetta Board, DPM

## 2020-10-23 ENCOUNTER — Other Ambulatory Visit: Payer: Self-pay | Admitting: Cardiology

## 2020-10-23 NOTE — Telephone Encounter (Signed)
Pharmacy notified atorvastatin was refilled on 09/05/20 for 90 days supply

## 2020-12-20 ENCOUNTER — Other Ambulatory Visit: Payer: Self-pay | Admitting: Family Medicine

## 2021-01-05 ENCOUNTER — Ambulatory Visit (INDEPENDENT_AMBULATORY_CARE_PROVIDER_SITE_OTHER): Payer: Medicare Other | Admitting: Podiatry

## 2021-01-05 ENCOUNTER — Other Ambulatory Visit: Payer: Self-pay

## 2021-01-05 ENCOUNTER — Encounter: Payer: Self-pay | Admitting: Podiatry

## 2021-01-05 DIAGNOSIS — M79674 Pain in right toe(s): Secondary | ICD-10-CM

## 2021-01-05 DIAGNOSIS — B351 Tinea unguium: Secondary | ICD-10-CM | POA: Diagnosis not present

## 2021-01-05 DIAGNOSIS — M79675 Pain in left toe(s): Secondary | ICD-10-CM | POA: Diagnosis not present

## 2021-01-05 NOTE — Progress Notes (Signed)
Subjective: Evan Wright is a pleasant 87 y.o. male patient seen today painful thick toenails that are difficult to trim. Pain interferes with ambulation. Aggravating factors include wearing enclosed shoe gear. Pain is relieved with periodic professional debridement.  Patient has h/o diabetic neuropathy. He does not check his blood glucose daily.  He is accompanied by his daughter on today's visit.  PCP is Carollee Herter, T[place] R, DO. Last visit was: 06/02/2020.  He voices no new pedal problems on today's visit.  Allergies  Allergen Reactions   Penicillins Other (See Comments)    Unknown reaction Did it involve swelling of the face/tongue/throat, SOB, or low BP? Unknown Did it involve sudden or severe rash/hives, skin peeling, or any reaction on the inside of your mouth or nose? Unknown Did you need to seek medical attention at a hospital or doctor's office? Unknown When did it last happen?    1940   If all above answers are "NO", may proceed with cephalosporin use.    Objective: Physical Exam  General: Evan Wright is a pleasant 87 y.o. Caucasian male, WD, WN in NAD. AAO x 3.   Vascular:  Capillary fill time to digits <3 seconds b/l lower extremities. Palpable PT pulse(s) b/l lower extremities Faintly palpable DP pulse(s) b/l lower extremities. Pedal hair absent. Lower extremity skin temperature gradient within normal limits. No edema noted b/l lower extremities.  Dermatological:  Pedal skin with normal turgor, texture and tone bilaterally. No open wounds bilaterally. No interdigital macerations bilaterally. Toenails 1-5 b/l elongated, discolored, dystrophic, thickened, crumbly with subungual debris and tenderness to dorsal palpation.  Musculoskeletal:  Normal muscle strength 5/5 to all lower extremity muscle groups bilaterally. No pain crepitus or joint limitation noted with ROM b/l. Hallux valgus with bunion deformity noted b/l lower extremities. Hammertoe(s) noted to the 2-5  bilaterally.  Neurological:  Protective sensation decreased with 10 gram monofilament b/l. Vibratory sensation intact b/l.  Assessment and Plan:  1. Pain due to onychomycosis of toenails of both feet     -Examined patient. -Continue diabetic foot care principles. -Patient to continue soft, supportive shoe gear daily. -Toenails 1-5 b/l were debrided in length and girth with sterile nail nippers and dremel without iatrogenic bleeding.  -Patient to report any pedal injuries to medical professional immediately. -Patient/POA to call should there be question/concern in the interim.  Return in about 9 weeks (around 03/09/2021).  Marzetta Board, DPM

## 2021-02-13 ENCOUNTER — Encounter: Payer: Self-pay | Admitting: Neurology

## 2021-02-13 ENCOUNTER — Other Ambulatory Visit: Payer: Self-pay

## 2021-02-13 ENCOUNTER — Ambulatory Visit: Payer: Medicare Other | Admitting: Neurology

## 2021-02-13 VITALS — BP 183/81 | HR 76 | Ht 71.0 in | Wt 164.0 lb

## 2021-02-13 DIAGNOSIS — G609 Hereditary and idiopathic neuropathy, unspecified: Secondary | ICD-10-CM | POA: Diagnosis not present

## 2021-02-13 NOTE — Patient Instructions (Signed)
Recommend restarting physical therapy at home  2. Recommend having more help at home or looking into assisted living facilities to get more help at home  3. Follow-up as needed
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
## 2021-03-08 ENCOUNTER — Other Ambulatory Visit: Payer: Self-pay

## 2021-03-08 ENCOUNTER — Emergency Department (HOSPITAL_BASED_OUTPATIENT_CLINIC_OR_DEPARTMENT_OTHER): Payer: Medicare Other

## 2021-03-08 ENCOUNTER — Encounter (HOSPITAL_BASED_OUTPATIENT_CLINIC_OR_DEPARTMENT_OTHER): Payer: Self-pay | Admitting: *Deleted

## 2021-03-08 ENCOUNTER — Emergency Department (HOSPITAL_BASED_OUTPATIENT_CLINIC_OR_DEPARTMENT_OTHER)
Admission: EM | Admit: 2021-03-08 | Discharge: 2021-03-08 | Disposition: A | Discharge status: Home / Self Care | Payer: Medicare Other | Attending: Emergency Medicine | Admitting: Emergency Medicine

## 2021-03-08 DIAGNOSIS — E039 Hypothyroidism, unspecified: Secondary | ICD-10-CM | POA: Insufficient documentation

## 2021-03-08 DIAGNOSIS — S199XXA Unspecified injury of neck, initial encounter: Secondary | ICD-10-CM | POA: Diagnosis not present

## 2021-03-08 DIAGNOSIS — S0101XA Laceration without foreign body of scalp, initial encounter: Secondary | ICD-10-CM | POA: Diagnosis not present

## 2021-03-08 DIAGNOSIS — E119 Type 2 diabetes mellitus without complications: Secondary | ICD-10-CM | POA: Diagnosis not present

## 2021-03-08 DIAGNOSIS — Z8546 Personal history of malignant neoplasm of prostate: Secondary | ICD-10-CM | POA: Insufficient documentation

## 2021-03-08 DIAGNOSIS — Z7982 Long term (current) use of aspirin: Secondary | ICD-10-CM | POA: Insufficient documentation

## 2021-03-08 DIAGNOSIS — Z79899 Other long term (current) drug therapy: Secondary | ICD-10-CM | POA: Insufficient documentation

## 2021-03-08 DIAGNOSIS — W06XXXA Fall from bed, initial encounter: Secondary | ICD-10-CM | POA: Diagnosis not present

## 2021-03-08 DIAGNOSIS — I2510 Atherosclerotic heart disease of native coronary artery without angina pectoris: Secondary | ICD-10-CM | POA: Insufficient documentation

## 2021-03-08 DIAGNOSIS — M4802 Spinal stenosis, cervical region: Secondary | ICD-10-CM | POA: Diagnosis not present

## 2021-03-08 DIAGNOSIS — S0990XA Unspecified injury of head, initial encounter: Secondary | ICD-10-CM

## 2021-03-08 DIAGNOSIS — I10 Essential (primary) hypertension: Secondary | ICD-10-CM | POA: Insufficient documentation

## 2021-03-08 DIAGNOSIS — Z955 Presence of coronary angioplasty implant and graft: Secondary | ICD-10-CM | POA: Diagnosis not present

## 2021-03-08 DIAGNOSIS — Z7984 Long term (current) use of oral hypoglycemic drugs: Secondary | ICD-10-CM | POA: Diagnosis not present

## 2021-03-08 DIAGNOSIS — I6523 Occlusion and stenosis of bilateral carotid arteries: Secondary | ICD-10-CM | POA: Diagnosis not present

## 2021-03-08 MED ORDER — LIDOCAINE-EPINEPHRINE 2 %-1:100000 IJ SOLN
1.7000 mL | Freq: Once | INTRAMUSCULAR | Status: AC
  Administered 2021-03-08: 1.7 mL via INTRADERMAL
  Filled 2021-03-08: qty 1.7

## 2021-03-08 MED ORDER — LIDOCAINE HCL (PF) 1 % IJ SOLN
INTRAMUSCULAR | Status: AC
  Filled 2021-03-08: qty 5

## 2021-03-08 NOTE — Discharge Instructions (Addendum)
Keep wound dry for the next 24 to 48 hours.  Apply topical antibiotics as needed.  Have staples removed next Friday by your primary care doctor, urgent care, or return here.

## 2021-03-08 NOTE — ED Notes (Signed)
Provider at bedside to repair lac

## 2021-03-08 NOTE — ED Notes (Signed)
Patient transported to CT 

## 2021-03-08 NOTE — ED Provider Notes (Signed)
[facility]ER[facility] DEPARTMENT Provider Note   CSN: [id number] Arrival date & time: 03/08/21  1201     History Chief Complaint  Patient presents with   Fall   Laceration    Evan Wright is a 87 y.o. male.  87 year old male with history of CAD, HTN, hypothyroidism, T2DM, SDH, prostate cancer presenting s/p fall that occurred approximately 4 hours prior to arrival.  Patient states that he was trying to get back in bed when he slipped and fell backwards, hitting his head against the nightstand.  He has had a lot of bleeding to his posterior scalp which has subsided.  He developed a headache shortly after fall.  Denies LOC, vomiting.  Reports no pain elsewhere.  He has had leg swelling that has been ongoing for several months with no recent change.  Has upcoming appointment with cardiology next week.  The history is provided by the patient and a relative. No language interpreter was used.  Fall Associated symptoms include headaches. Pertinent negatives include no chest pain, no abdominal pain and no shortness of breath.  Laceration     Past Medical History:  Diagnosis Date   Abnormal EKG 04/02/2017   Adenomatous colon polyp    Angina pectoris ([place]) 04/2017   CAD (coronary artery disease)    a. Cath 04/29/17-> high grade pLAD with R to L collaterals, high grade 1st dig  2. Cath 05/01/17 s/p ostial to mLAD with DES x 2.    CAD (coronary artery disease), native coronary artery 04/29/2017   Cardiac murmur    Coronary artery disease    De Quervain's tenosynovitis 05/18/2012   Diverticulosis    DIZZINESS 10/15/2007   Qualifier: Diagnosis of  By: [name]     Essential hypertension 09/25/2017   Family history of malignant neoplasm of gastrointestinal tract 05/14/2011   Brother with colon cancer    GASTROENTERITIS 01/20/2008   Qualifier: Diagnosis of  By: [name], [name]     GERD (gastroesophageal reflux disease)    HIATAL HERNIA WITH REFLUX 02/11/2008   Qualifier:  Diagnosis of  By: [name]     History of hiatal hernia    Hyperglycemia 09/25/2017   Hyperlipidemia    Hypothyroidism 09/25/2017   Idiopathic peripheral neuropathy 10/11/2016   Intermittent claudication ([place]) 06/02/2020   Ischemic cardiomyopathy 04/10/2017   Loss of height 06/11/2016   Mixed dyslipidemia 04/10/2017   NEVI, MULTIPLE 07/12/2010   Qualifier: Diagnosis of  By: [name]     Nonsustained ventricular tachycardia ([place]) 04/24/2017   Osteoarthritis    OSTEOARTHRITIS 07/12/2010   Qualifier: Diagnosis of  By: [name]     Osteopenia 09/25/2017   Personal history of colonic polyps 11/17/2007   Qualifier: Diagnosis of  By: [name], [name]    Preventative health care 09/23/2018   Prostate cancer [facility])    PROSTATE CANCER, HX OF 10/15/2007   Qualifier: Diagnosis of  By: [name]     Proximal leg weakness 10/11/2016   Right arm weakness 09/28/2018   SDH (subdural hematoma) ([place]) 09/28/2018   Seizure-like activity ([place]) 09/28/2018   Slow transit constipation 08/10/2019   Uncontrolled diabetes mellitus with hyperglycemia ([place]) 09/23/2018   Unspecified vitamin D deficiency 02/11/2008   Qualifier: Diagnosis of  By: [name]     Weakness of both lower extremities 06/11/2016    Patient Active Problem List   Diagnosis Date Noted   Adenomatous colon polyp    CAD (  coronary artery disease)    Cardiac murmur    Coronary artery disease    Diverticulosis    GERD (gastroesophageal reflux disease)    History of hiatal hernia    Hyperlipidemia    Prostate cancer ([place])    Osteoarthritis    Intermittent claudication ([place]) 06/02/2020   Slow transit constipation 08/10/2019   SDH (subdural hematoma) ([place]) 09/28/2018   Seizure-like activity ([place]) 09/28/2018   Right arm weakness 09/28/2018   Preventative health care 09/23/2018   Uncontrolled diabetes mellitus with hyperglycemia ([place]) 09/23/2018   Osteopenia 09/25/2017   Essential hypertension 09/25/2017    Hyperglycemia 09/25/2017   Hypothyroidism 09/25/2017   CAD (coronary artery disease), native coronary artery 04/29/2017   Nonsustained ventricular tachycardia ([place]) 04/24/2017   Angina pectoris ([place]) 04/2017   Ischemic cardiomyopathy 04/10/2017   Mixed dyslipidemia 04/10/2017   Abnormal EKG 04/02/2017   Idiopathic peripheral neuropathy 10/11/2016   Proximal leg weakness 10/11/2016   Weakness of both lower extremities 06/11/2016   Loss of height 06/11/2016   De Quervain's tenosynovitis 05/18/2012   Family history of malignant neoplasm of gastrointestinal tract 05/14/2011   NEVI, MULTIPLE 07/12/2010   OSTEOARTHRITIS 07/12/2010   UNSPECIFIED VITAMIN D DEFICIENCY 02/11/2008   HIATAL HERNIA WITH REFLUX 02/11/2008   GASTROENTERITIS 01/20/2008   PERSONAL HISTORY OF COLONIC POLYPS 11/17/2007   DIZZINESS 10/15/2007   PROSTATE CANCER, HX OF 10/15/2007    Past Surgical History:  Procedure Laterality Date   APPENDECTOMY  1948   COLONOSCOPY     CORONARY STENT INTERVENTION N/A 05/01/2017   Procedure: CORONARY STENT INTERVENTION;  Surgeon: [name], [name], MD;  Location: [facility];  Service: Cardiovascular;  Laterality: N/A;   EYE SURGERY  2012   march and April  --Dr [name]   LEFT HEART CATH AND CORONARY ANGIOGRAPHY N/A 04/29/2017   Procedure: LEFT HEART CATH AND CORONARY ANGIOGRAPHY;  Surgeon: [name], MD;  Location: [facility];  Service: Cardiovascular;  Laterality: N/A;   PROSTATECTOMY     TONSILLECTOMY         Family History  Problem Relation Age of Onset   Colon cancer Brother    Lung cancer Father    Prostate cancer Father    Colitis Mother     Social History   Tobacco Use   Smoking status: Never   Smokeless tobacco: Never  Vaping Use   Vaping Use: Never used  Substance Use Topics   Alcohol use: Never   Drug use: No    Home Medications Prior to Admission medications   Medication Sig Start Date End Date Taking? Authorizing Provider   alendronate (FOSAMAX) 70 MG tablet Take 1 tablet (70 mg total) by mouth every 7 (seven) days. Take with a full glass of water on an empty stomach. 09/14/20   [name], DO  aspirin EC 81 MG tablet Take 1 tablet (81 mg total) by mouth daily at 12 noon. 10/06/18   Hongalgi, Lenis Dickinson, MD  atorvastatin (LIPITOR) 80 MG tablet Take 1 tablet (80 mg total) by mouth daily. 09/05/20   Revankar, Reita Cliche, MD  Calcium Carb-Cholecalciferol (CALCIUM+D3 PO) Take 1 tablet by mouth daily at 12 noon.    [provider]  carvedilol (COREG) 3.125 MG tablet Take 1 tablet (3.125 mg total) by mouth 2 (two) times daily with a meal. 06/02/20   Carollee Herter, Alferd Apa, DO  lansoprazole (PREVACID) 30 MG capsule Take 1 capsule (30 mg total) by mouth 2 (two) times  daily before a meal. Patient taking differently: Take 30 mg by mouth daily at 12 noon. 06/02/20   [name], DO  levothyroxine (SYNTHROID) 100 MCG tablet Take 1 tablet (100 mcg total) by mouth daily before breakfast. Pt due for office visit 09/05/20   Roma Schanz R, DO  MAGNESIUM PO Take 1 tablet by mouth daily at 12 noon. Patient not taking: Reported on 02/13/2021    [provider]  metFORMIN (GLUCOPHAGE) 500 MG tablet Take 1 tablet (500 mg total) by mouth 2 (two) times daily with a meal. 06/02/20   Carollee Herter, Alferd Apa, DO  Multiple Vitamins-Minerals (PRESERVISION AREDS 2) CAPS Take 1 capsule by mouth daily at 12 noon.    [provider]  nitroGLYCERIN (NITROSTAT) 0.4 MG SL tablet Place 0.4 mg under the tongue every 5 (five) minutes as needed for chest pain.    [provider]  Polyethyl Glycol-Propyl Glycol (SYSTANE OP) Place 1 drop into both eyes daily as needed (dry eyes).    [provider]  Psyllium (METAMUCIL PO) Take 1 Dose by mouth every 3 (three) days.     [provider]  ranolazine (RANEXA) 500 MG 12 hr tablet TAKE ONE TABLET (500 MG TOTAL) BY MOUTH ONCE DAILY 08/25/20    Revankar, Reita Cliche, MD  Vitamin D, Ergocalciferol, (DRISDOL) 1.25 MG (50000 UNIT) CAPS capsule TAKE ONE CAPSULE (50000 UNITS TOTAL) BY MOUTH EVERY 7 DAYS 12/20/20   [name], DO    Allergies    Penicillins  Review of Systems   Review of Systems  Respiratory:  Negative for shortness of breath.   Cardiovascular:  Positive for leg swelling. Negative for chest pain.  Gastrointestinal:  Negative for abdominal pain and vomiting.  Skin:  Positive for wound.  Neurological:  Positive for headaches. Negative for syncope.   Physical Exam Updated Vital Signs BP (!) 169/75 (BP Location: Right Arm)   Pulse 70   Temp 98.3 F (36.8 C) (Oral)   Resp 18   Ht '5\' 11"'$  (1.803 m)   Wt 73.9 kg   SpO2 100%   BMI 22.73 kg/m   Physical Exam Vitals and nursing note reviewed.  Constitutional:      General: He is not in acute distress.    Appearance: Normal appearance. He is well-developed. He is not ill-appearing.  HENT:     Head: Normocephalic.     Mouth/Throat:     Mouth: Mucous membranes are moist.  Eyes:     Extraocular Movements: Extraocular movements intact.     Conjunctiva/sclera: Conjunctivae normal.     Pupils: Pupils are equal, round, and reactive to light.  Cardiovascular:     Rate and Rhythm: Normal rate and regular rhythm.     Pulses: Normal pulses.     Heart sounds: Normal heart sounds. No murmur heard. Pulmonary:     Effort: Pulmonary effort is normal. No respiratory distress.     Breath sounds: Normal breath sounds.  Abdominal:     Palpations: Abdomen is soft.     Tenderness: There is no abdominal tenderness.  Musculoskeletal:        General: No tenderness. Normal range of motion.     Cervical back: Neck supple.     Comments: No tenderness to palpation along midline spine or paraspinal muscles.  Negative logroll bilaterally.  Skin:    General: Skin is warm and dry.     Comments: Approximately 2 cm gaping laceration to posterior scalp, hemostatic.  Neurological:      General: No focal deficit present.     Mental Status: He is alert and oriented to person, place, and time. Mental status is at baseline.     Cranial Nerves: No cranial nerve deficit.     Motor: No weakness.     Comments: Full strength in all extremities    ED Results / Procedures / Treatments   Labs (all labs ordered are listed, but only abnormal results are displayed) Labs Reviewed - No data to display  EKG None  Radiology CT Head Wo Contrast  Result Date: 03/08/2021 CLINICAL DATA:  Fall today striking the back of the head. Scalp laceration. EXAM: CT HEAD WITHOUT CONTRAST TECHNIQUE: Contiguous axial images were obtained from the base of the skull through the vertex without intravenous contrast. COMPARISON:  09/28/2018 FINDINGS: Brain: As on the prior exam, there is 1.2 cm thick extra-axial density along the left frontoparietal vertex, measuring about 4.1 cm anterior-posterior and about 2.7 cm left-to-right. Although this somewhat resembles a subdural hematoma, the stable appearance from 09/28/2018 indicates that this is highly likely to be a meningioma. There is some reactive bony findings for example on image 28 series 4. Periventricular white matter and corona radiata hypodensities favor chronic ischemic microvascular white matter disease. Progressive hypodensity in the left external capsule on image 18 series 2 also likely from progressive chronic ischemic microvascular white matter disease. Otherwise, the brainstem, cerebellum, cerebral peduncles, thalamus, basal ganglia, basilar cisterns, and ventricular system appear within normal limits. No intracranial hemorrhage or acute CVA identified. Vascular: Unremarkable Skull: Mild reactive findings related to the meningioma. No fracture or acute bony abnormality. Sinuses/Orbits: Unremarkable Other: No supplemental non-categorized findings. IMPRESSION: 1. Stable persistent left frontoparietal extra-axial density compatible with meningioma. No  intracranial hemorrhage or acute intracranial findings. 2. Periventricular white matter and corona radiata hypodensities favor chronic ischemic microvascular white matter disease. This is mildly confluent in the left frontal region. Electronically Signed   By: Van Clines M.D.   On: 03/08/2021 14:31   CT Cervical Spine Wo Contrast  Result Date: 03/08/2021 CLINICAL DATA:  Fall striking the back of the head.  Laceration. EXAM: CT CERVICAL SPINE WITHOUT CONTRAST TECHNIQUE: Multidetector CT imaging of the cervical spine was performed without intravenous contrast. Multiplanar CT image reconstructions were also generated. COMPARISON:  Cervical spine CT 09/28/2018 FINDINGS: Alignment: No vertebral subluxation is observed. Skull base and vertebrae: No cervical spine fracture or subluxation is identified. Soft tissues and spinal canal: Bilateral common carotid atherosclerotic calcification. Disc levels: Uncinate and facet spurring cause right foraminal stenosis at C4-5 and C6-7; and left foraminal stenosis at C3-4, C4-5, C5-6, and C6-7. There is potential mild central narrowing of the thecal sac at C3-4, C4-5, C5-6, and C6-7. Upper chest: Unremarkable Other: No supplemental non-categorized findings. IMPRESSION: 1. No acute cervical spine findings. 2. Multilevel impingement due to spurring. 3. Carotid atherosclerosis. Electronically Signed   By: Van Clines M.D.   On: 03/08/2021 14:37    Procedures .Marland KitchenLaceration Repair  Date/Time: 03/08/2021 3:50 PM Performed by: Zola Button, MD Authorized by: Gareth Morgan, MD   Consent:    Consent obtained:  Verbal   Consent given by:  Patient Anesthesia:    Anesthesia method:  Local infiltration   Local anesthetic:  Lidocaine 1% WITH epi Laceration details:    Location:  Scalp   Scalp location:  Occipital   Length (cm):  2 Pre-procedure details:    Preparation:  Patient was prepped and draped in usual sterile fashion  Treatment:    Amount of  cleaning:  Standard   Irrigation solution:  Sterile saline Skin repair:    Repair method:  Staples   Number of staples:  3 Approximation:    Approximation:  Close Repair type:    Repair type:  Simple Post-procedure details:    Dressing:  Open (no dressing)   Procedure completion:  Tolerated well, no immediate complications   Medications Ordered in ED Medications  lidocaine-EPINEPHrine (XYLOCAINE W/EPI) 2 %-1:100000 (with pres) injection 1.7 mL (1.7 mLs Intradermal Given 03/08/21 1407)    ED Course  I have reviewed the triage vital signs and the nursing notes.  Pertinent labs & imaging results that were available during my care of the patient were reviewed by me and considered in my medical decision making (see chart for details).    MDM Rules/Calculators/A&P                         87 year old male with history outlined above presenting with mechanical fall resulting in head injury.  He takes aspirin but is not on any anticoagulation.  No other injuries.  NAD with nonfocal neurological exam.  Will order CT head and cervical spine given age.  CT head and cervical spine unremarkable.  Laceration repaired with staples as noted above.  Stable for discharge at this time.  Final Clinical Impression(s) / ED Diagnoses Final diagnoses:  Injury of head, initial encounter    Rx / DC Orders ED Discharge Orders     None        Zola Button, MD 03/08/21 1555    Gareth Morgan, MD 03/09/21 [phone]

## 2021-03-08 NOTE — ED Triage Notes (Signed)
He lost his balance and fell backward hitting his head on a night stand 4 hours ago. Laceration to the back of his head. Bleeding clotted. He does not take blood thinners.

## 2021-03-12 ENCOUNTER — Other Ambulatory Visit: Payer: Self-pay

## 2021-03-13 ENCOUNTER — Encounter: Payer: Self-pay | Admitting: Cardiology

## 2021-03-13 ENCOUNTER — Other Ambulatory Visit: Payer: Self-pay

## 2021-03-13 ENCOUNTER — Other Ambulatory Visit: Payer: Self-pay | Admitting: Cardiology

## 2021-03-13 ENCOUNTER — Ambulatory Visit: Payer: Medicare Other | Admitting: Cardiology

## 2021-03-13 VITALS — BP 182/80 | HR 67 | Ht 72.0 in | Wt 165.1 lb

## 2021-03-13 DIAGNOSIS — E782 Mixed hyperlipidemia: Secondary | ICD-10-CM | POA: Diagnosis not present

## 2021-03-13 DIAGNOSIS — R600 Localized edema: Secondary | ICD-10-CM

## 2021-03-13 DIAGNOSIS — I351 Nonrheumatic aortic (valve) insufficiency: Secondary | ICD-10-CM

## 2021-03-13 DIAGNOSIS — I2510 Atherosclerotic heart disease of native coronary artery without angina pectoris: Secondary | ICD-10-CM

## 2021-03-13 DIAGNOSIS — M79604 Pain in right leg: Secondary | ICD-10-CM

## 2021-03-13 DIAGNOSIS — I10 Essential (primary) hypertension: Secondary | ICD-10-CM

## 2021-03-13 DIAGNOSIS — I712 Thoracic aortic aneurysm, without rupture: Secondary | ICD-10-CM

## 2021-03-13 DIAGNOSIS — I7121 Aneurysm of the ascending aorta, without rupture: Secondary | ICD-10-CM

## 2021-03-13 DIAGNOSIS — M79605 Pain in left leg: Secondary | ICD-10-CM

## 2021-03-13 HISTORY — DX: Localized edema: R60.0

## 2021-03-13 HISTORY — DX: Aneurysm of the ascending aorta, without rupture: I71.21

## 2021-03-13 HISTORY — DX: Nonrheumatic aortic (valve) insufficiency: I35.1

## 2021-03-13 NOTE — Patient Instructions (Signed)
Medication Instructions:  Your physician recommends that you continue on your current medications as directed. Please refer to the Current Medication list given to you today.  *If you need a refill on your cardiac medications before your next appointment, please call your pharmacy*   Lab Work: Your physician recommends that you have labs done in the office today. Your test included  basic metabolic panel, complete blood count, TSH, liver function and lipids.  If you have labs (blood work) drawn today and your tests are completely normal, you will receive your results only by: [facility] (if you have MyChart) OR A paper copy in the mail If you have any lab test that is abnormal or we need to change your treatment, we will call you to review the results.   Testing/Procedures: Your physician has requested that you have a lower extremity venous duplex. This test is an ultrasound of the veins in the legs. It looks at venous blood flow that carries blood from the heart to the legs or arms. Allow one hour for a Lower Venous exam. There are no restrictions or special instructions.     Follow-Up: At [facility], you and your health needs are our priority.  As part of our continuing mission to provide you with exceptional heart care, we have created designated Provider Care Teams.  These Care Teams include your primary Cardiologist (physician) and Advanced Practice Providers (APPs -  Physician Assistants and Nurse Practitioners) who all work together to provide you with the care you need, when you need it.  We recommend signing up for the patient portal called "MyChart".  Sign up information is provided on this After Visit Summary.  MyChart is used to connect with patients for Virtual Visits (Telemedicine).  Patients are able to view lab/test results, encounter notes, upcoming appointments, etc.  Non-urgent messages can be sent to your provider as well.   To learn more about what you can do  with MyChart, go to [email or web address].    Your next appointment:   6 month(s)  The format for your next appointment:   In Person  Provider:   Jyl Heinz, MD   Other Instructions NA

## 2021-03-13 NOTE — Progress Notes (Signed)
Cardiology Office Note:    Date:  03/13/2021   ID:  Evan Wright, DOB 06/20/1925, MRN [id number]  PCP:  Carollee Herter, Alferd Apa, DO  Cardiologist:  Jenean Lindau, MD   Referring MD: Carollee Herter, Alferd Apa, *    ASSESSMENT:    1. Coronary artery disease involving native coronary artery of native heart without angina pectoris   2. Essential hypertension   3. Mixed hyperlipidemia   4. Leg pain, bilateral   5. Moderate aortic regurgitation   6. Ascending aortic aneurysm (HCC)   7. Pedal edema    PLAN:    In order of problems listed above:  Coronary artery disease: Secondary prevention stressed to the patient.  Importance of compliance with diet medication stressed any vocalized understanding.  He ambulates age appropriately.  No symptoms from the standpoint. Bilateral pedal edema: Patient mentions to me that right is greater than left.  Today is not that obvious but for his satisfaction we will do a bilateral DVT study. Mild to moderate aortic regurgitation: Appears stable at this time clinically Echo report reviewed and discussed with him.  Medical management at this time. Ascending aortic aneurysm: I discussed this finding.  They are not keen on any further evaluation as she is not a candidate for surgery for obesity.  I respect their wishes. Mixed dyslipidemia: On statin therapy and doing well. Essential hypertension: [place] hypertension: This has been noticed at every doctors visit.  His blood pressures at stable at home and his family keeps a track of it. We will do complete blood work today including a BNP and lipids. Patient will be seen in follow-up appointment in 6 months or earlier if the patient has any concerns    Medication Adjustments/Labs and Tests Ordered: Current medicines are reviewed at length with the patient today.  Concerns regarding medicines are outlined above.  Orders Placed This Encounter  Procedures   Basic metabolic panel   CBC with  Differential/Platelet   Hepatic function panel   Lipid panel   TSH   EKG 12-Lead   VAS [place] LOWER EXTREMITY VENOUS (DVT)   No orders of the defined types were placed in this encounter.    No chief complaint on file.    History of Present Illness:    Evan Wright is a 87 y.o. male.  Patient has past medical history of coronary artery disease, essential hypertension, mixed dyslipidemia, ascending aortic aneurysm and moderate aortic regurgitation.  He ambulates with a walker.  He fell on the other day and was evaluated in the emergency room.  He denies any chest pain orthopnea or PND.  He has complaints of bilateral pedal edema at times.  Right is greater than left.  Today he tells me it is much better.  Past Medical History:  Diagnosis Date   Abnormal EKG 04/02/2017   Adenomatous colon polyp    Angina pectoris ([place]) 04/2017   CAD (coronary artery disease)    a. Cath 04/29/17-> high grade pLAD with R to L collaterals, high grade 1st dig  2. Cath 05/01/17 s/p ostial to mLAD with DES x 2.    CAD (coronary artery disease), native coronary artery 04/29/2017   Cardiac murmur    Coronary artery disease    De Quervain's tenosynovitis 05/18/2012   Diverticulosis    DIZZINESS 10/15/2007   Qualifier: Diagnosis of  By: [name]     Essential hypertension 09/25/2017   Family history of malignant neoplasm of gastrointestinal tract 05/14/2011  Brother with colon cancer    GASTROENTERITIS 01/20/2008   Qualifier: Diagnosis of  By: [name], [name]     GERD (gastroesophageal reflux disease)    HIATAL HERNIA WITH REFLUX 02/11/2008   Qualifier: Diagnosis of  By: [name]     History of hiatal hernia    Hyperglycemia 09/25/2017   Hyperlipidemia    Hypothyroidism 09/25/2017   Idiopathic peripheral neuropathy 10/11/2016   Intermittent claudication ([place]) 06/02/2020   Ischemic cardiomyopathy 04/10/2017   Loss of height 06/11/2016   Mixed dyslipidemia 04/10/2017   NEVI, MULTIPLE 07/12/2010    Qualifier: Diagnosis of  By: [name]     Nonsustained ventricular tachycardia ([place]) 04/24/2017   Osteoarthritis    OSTEOARTHRITIS 07/12/2010   Qualifier: Diagnosis of  By: [name]     Osteopenia 09/25/2017   Personal history of colonic polyps 11/17/2007   Qualifier: Diagnosis of  By: [name], [name]    Preventative health care 09/23/2018   Prostate cancer [facility])    PROSTATE CANCER, HX OF 10/15/2007   Qualifier: Diagnosis of  By: [name]     Proximal leg weakness 10/11/2016   Right arm weakness 09/28/2018   SDH (subdural hematoma) ([place]) 09/28/2018   Seizure-like activity ([place]) 09/28/2018   Slow transit constipation 08/10/2019   Uncontrolled diabetes mellitus with hyperglycemia ([place]) 09/23/2018   Unspecified vitamin D deficiency 02/11/2008   Qualifier: Diagnosis of  By: [name]     Weakness of both lower extremities 06/11/2016    Past Surgical History:  Procedure Laterality Date   APPENDECTOMY  1948   COLONOSCOPY     CORONARY STENT INTERVENTION N/A 05/01/2017   Procedure: CORONARY STENT INTERVENTION;  Surgeon: [name], [name], MD;  Location: [place] [facility];  Service: Cardiovascular;  Laterality: N/A;   EYE SURGERY  2012   march and April  --Dr [name]   LEFT HEART CATH AND CORONARY ANGIOGRAPHY N/A 04/29/2017   Procedure: LEFT HEART CATH AND CORONARY ANGIOGRAPHY;  Surgeon: [name], MD;  Location: [place] [facility];  Service: Cardiovascular;  Laterality: N/A;   PROSTATECTOMY     TONSILLECTOMY      Current Medications: Current Meds  Medication Sig   alendronate (FOSAMAX) 70 MG tablet Take 1 tablet (70 mg total) by mouth every 7 (seven) days. Take with a full glass of water on an empty stomach.   aspirin EC 81 MG tablet Take 1 tablet (81 mg total) by mouth daily at 12 noon.   atorvastatin (LIPITOR) 80 MG tablet Take 1 tablet (80 mg total) by mouth daily.   Calcium Carb-Cholecalciferol (CALCIUM+D3 PO) Take 1 tablet by mouth daily at 12 noon.    carvedilol (COREG) 3.125 MG tablet Take 1 tablet (3.125 mg total) by mouth 2 (two) times daily with a meal.   lansoprazole (PREVACID) 30 MG capsule Take 1 capsule (30 mg total) by mouth 2 (two) times daily before a meal.   levothyroxine (SYNTHROID) 100 MCG tablet Take 1 tablet (100 mcg total) by mouth daily before breakfast. Pt due for office visit   MAGNESIUM PO Take 1 tablet by mouth daily at 12 noon.   metFORMIN (GLUCOPHAGE) 500 MG tablet Take 1 tablet (500 mg total) by mouth 2 (two) times daily with a meal.   Multiple Vitamins-Minerals (PRESERVISION AREDS 2) CAPS Take 1 capsule by mouth daily at 12 noon.   nitroGLYCERIN (NITROSTAT) 0.4 MG SL tablet Place 0.4 mg under the tongue every 5 (five)  minutes as needed for chest pain.   ranolazine (RANEXA) 500 MG 12 hr tablet TAKE ONE TABLET (500 MG TOTAL) BY MOUTH ONCE DAILY   Vitamin D, Ergocalciferol, (DRISDOL) 1.25 MG (50000 UNIT) CAPS capsule TAKE ONE CAPSULE (50000 UNITS TOTAL) BY MOUTH EVERY 7 DAYS     Allergies:   Penicillins   Social History   Socioeconomic History   Marital status: Married    Spouse name: Not on file   Number of children: 2   Years of education: Not on file   Highest education level: Not on file  Occupational History   Occupation: retired    Fish farm manager: RETIRED  Tobacco Use   Smoking status: Never   Smokeless tobacco: Never  Vaping Use   Vaping Use: Never used  Substance and Sexual Activity   Alcohol use: Never   Drug use: No   Sexual activity: Not on file  Other Topics Concern   Not on file  Social History Narrative   Right handed   Lives with family   Social Determinants of Health   Financial Resource Strain: Not on file  Food Insecurity: Not on file  Transportation Needs: Not on file  Physical Activity: Not on file  Stress: Not on file  Social Connections: Not on file     Family History: The patient's family history includes Colitis in his mother; Colon cancer in his brother; Lung cancer in his  father; Prostate cancer in his father.  ROS:   Please see the history of present illness.    All other systems reviewed and are negative.  EKGs/Labs/Other Studies Reviewed:    The following studies were reviewed today: I discussed my findings with the patient at length including echocardiogram and CT scan report from the past.   Recent Labs: 06/02/2020: ALT 15; BUN 17; Creatinine, Ser 1.16; Hemoglobin 14.5; Platelets 214.0; Potassium 4.4; Sodium 141; TSH 4.67  Recent Lipid Panel    Component Value Date/Time   CHOL 139 06/02/2020 1117   CHOL 126 02/14/2020 1101   TRIG 142.0 06/02/2020 1117   HDL 46.30 06/02/2020 1117   HDL 46 02/14/2020 1101   CHOLHDL 3 06/02/2020 1117   VLDL 28.4 06/02/2020 1117   LDLCALC 64 06/02/2020 1117   LDLCALC 62 02/14/2020 1101   LDLDIRECT 165.3 10/08/2010 1008    Physical Exam:    VS:  BP (!) 182/80   Pulse 67   Ht 6' (1.829 m)   Wt 165 lb 1.3 oz (74.9 kg)   BMI 22.39 kg/m     Wt Readings from Last 3 Encounters:  03/13/21 165 lb 1.3 oz (74.9 kg)  03/08/21 163 lb (73.9 kg)  02/13/21 164 lb (74.4 kg)     GEN: Patient is in no acute distress HEENT: Normal NECK: No JVD; No carotid bruits LYMPHATICS: No lymphadenopathy CARDIAC: Hear sounds regular, 2/6 systolic murmur at the apex. RESPIRATORY:  Clear to auscultation without rales, wheezing or rhonchi  ABDOMEN: Soft, non-tender, non-distended MUSCULOSKELETAL:  No edema; No deformity  SKIN: Warm and dry NEUROLOGIC:  Alert and oriented x 3 PSYCHIATRIC:  Normal affect   Signed, Jenean Lindau, MD  03/13/2021 1:43 PM     [facility]

## 2021-03-14 LAB — CBC WITH DIFFERENTIAL/PLATELET
Basophils Absolute: 0 10*3/uL (ref 0.0–0.2)
Basos: 0 %
EOS (ABSOLUTE): 0.2 10*3/uL (ref 0.0–0.4)
Eos: 3 %
Hematocrit: 41.9 % (ref 37.5–51.0)
Hemoglobin: 13.4 g/dL (ref 13.0–17.7)
Immature Grans (Abs): 0 10*3/uL (ref 0.0–0.1)
Immature Granulocytes: 1 %
Lymphocytes Absolute: 1 10*3/uL (ref 0.7–3.1)
Lymphs: 16 %
MCH: 30 pg (ref 26.6–33.0)
MCHC: 32 g/dL (ref 31.5–35.7)
MCV: 94 fL (ref 79–97)
Monocytes Absolute: 0.6 10*3/uL (ref 0.1–0.9)
Monocytes: 9 %
Neutrophils Absolute: 4.6 10*3/uL (ref 1.4–7.0)
Neutrophils: 71 %
Platelets: 246 10*3/uL (ref 150–450)
RBC: 4.47 x10E6/uL (ref 4.14–5.80)
RDW: 12.1 % (ref 11.6–15.4)
WBC: 6.4 10*3/uL (ref 3.4–10.8)

## 2021-03-14 LAB — BASIC METABOLIC PANEL
BUN/Creatinine Ratio: 14 (ref 10–24)
BUN: 22 mg/dL (ref 10–36)
CO2: 17 mmol/L — ABNORMAL LOW (ref 20–29)
Calcium: 9.9 mg/dL (ref 8.6–10.2)
Chloride: 104 mmol/L (ref 96–106)
Creatinine, Ser: 1.52 mg/dL — ABNORMAL HIGH (ref 0.76–1.27)
Glucose: 104 mg/dL — ABNORMAL HIGH (ref 65–99)
Potassium: 4.6 mmol/L (ref 3.5–5.2)
Sodium: 141 mmol/L (ref 134–144)
eGFR: 42 mL/min/{1.73_m2} — ABNORMAL LOW (ref >59)

## 2021-03-14 LAB — TSH: TSH: 4.31 u[IU]/mL (ref 0.450–4.500)

## 2021-03-14 LAB — HEPATIC FUNCTION PANEL
ALT: 10 IU/L (ref 0–44)
AST: 13 IU/L (ref 0–40)
Albumin: 4.6 g/dL (ref 3.5–4.6)
Alkaline Phosphatase: 88 IU/L (ref 44–121)
Bilirubin Total: 0.3 mg/dL (ref 0.0–1.2)
Bilirubin, Direct: 0.11 mg/dL (ref 0.00–0.40)
Total Protein: 7.2 g/dL (ref 6.0–8.5)

## 2021-03-14 LAB — LIPID PANEL
Chol/HDL Ratio: 4.5 ratio (ref 0.0–5.0)
Cholesterol, Total: 188 mg/dL (ref 100–199)
HDL: 42 mg/dL (ref >39)
LDL Chol Calc (NIH): 110 mg/dL — ABNORMAL HIGH (ref 0–99)
Triglycerides: 209 mg/dL — ABNORMAL HIGH (ref 0–149)
VLDL Cholesterol Cal: 36 mg/dL (ref 5–40)

## 2021-03-14 MED ORDER — EZETIMIBE 10 MG PO TABS: 10.0000 mg | ORAL_TABLET | Freq: Every day | ORAL | 3 refills | Status: DC

## 2021-03-14 NOTE — Addendum Note (Signed)
Addended by: Truddie Hidden on: 03/14/2021 10:58 AM   Modules accepted: Orders

## 2021-03-16 ENCOUNTER — Ambulatory Visit (HOSPITAL_COMMUNITY)
Admission: RE | Admit: 2021-03-16 | Discharge: 2021-03-16 | Disposition: A | Discharge status: Home / Self Care | Payer: Medicare Other | Source: Ambulatory Visit | Attending: Cardiology | Admitting: Cardiology

## 2021-03-16 ENCOUNTER — Telehealth: Payer: Self-pay | Admitting: Cardiology

## 2021-03-16 ENCOUNTER — Other Ambulatory Visit: Payer: Self-pay | Admitting: Family Medicine

## 2021-03-16 ENCOUNTER — Other Ambulatory Visit: Payer: Self-pay

## 2021-03-16 DIAGNOSIS — I10 Essential (primary) hypertension: Secondary | ICD-10-CM | POA: Insufficient documentation

## 2021-03-16 DIAGNOSIS — M79605 Pain in left leg: Secondary | ICD-10-CM | POA: Insufficient documentation

## 2021-03-16 DIAGNOSIS — M79604 Pain in right leg: Secondary | ICD-10-CM | POA: Diagnosis not present

## 2021-03-16 DIAGNOSIS — I2510 Atherosclerotic heart disease of native coronary artery without angina pectoris: Secondary | ICD-10-CM | POA: Insufficient documentation

## 2021-03-16 NOTE — Telephone Encounter (Signed)
Per Marya Amsler study looks negative.

## 2021-03-16 NOTE — Progress Notes (Signed)
Bilateral lower extremity venous duplex has been completed. Preliminary results can be found in CV Proc through chart review.  Results were given to [facility] at Dr. Julien Nordmann office.  03/16/21 1:32 PM Carlos Levering RVT

## 2021-03-16 NOTE — Telephone Encounter (Signed)
Evan Wright from [facility] V[facility] department calling with results.

## 2021-03-31 ENCOUNTER — Other Ambulatory Visit: Payer: Self-pay | Admitting: Family Medicine

## 2021-03-31 ENCOUNTER — Other Ambulatory Visit: Payer: Self-pay | Admitting: Cardiology

## 2021-04-02 ENCOUNTER — Other Ambulatory Visit: Payer: Self-pay

## 2021-04-02 ENCOUNTER — Encounter: Payer: Self-pay | Admitting: Podiatry

## 2021-04-02 ENCOUNTER — Ambulatory Visit (INDEPENDENT_AMBULATORY_CARE_PROVIDER_SITE_OTHER): Payer: Medicare Other | Admitting: Podiatry

## 2021-04-02 DIAGNOSIS — M79674 Pain in right toe(s): Secondary | ICD-10-CM

## 2021-04-02 DIAGNOSIS — M79675 Pain in left toe(s): Secondary | ICD-10-CM

## 2021-04-02 DIAGNOSIS — B351 Tinea unguium: Secondary | ICD-10-CM | POA: Diagnosis not present

## 2021-04-02 NOTE — Telephone Encounter (Signed)
Would you like for Pt to continue Ergocalciferol?

## 2021-04-03 ENCOUNTER — Telehealth: Payer: Self-pay | Admitting: Cardiology

## 2021-04-03 NOTE — Telephone Encounter (Signed)
Patient's wife states Dr. Geraldo Pitter advised the patient to have lab work with his PCP, but they contacted his PCP and they scheduled a regular office visit instead of lab work. She would like to clarify whether or not the lab work with PCP is still needed. Please advise.

## 2021-04-03 NOTE — Telephone Encounter (Signed)
Pt had not been taking his atorvastatin and is now taking per his daughter per DPR. Will repeat labs 6 weeks from restart of Atorvastatin.

## 2021-04-05 NOTE — Progress Notes (Signed)
  Subjective:  Patient ID: Evan Wright, male    DOB: 08-29-1924,  MRN: [id number]  87 y.o. male presents with thick, elongated toenails b/l feet which are tender when wearing enclosed shoe gear.[name]    His daughter is present during today's visit. His daughter states Evan Wright hasn't applied moisturizer to his feet daily. They voice no new pedal problems on today's visit.  PCP: [name], DO and last visit was: 06/07/2020.  Review of Systems: Negative except as noted in the HPI.   Allergies  Allergen Reactions   Penicillins Other (See Comments)    Unknown reaction Did it involve swelling of the face/tongue/throat, SOB, or low BP? Unknown Did it involve sudden or severe rash/hives, skin peeling, or any reaction on the inside of your mouth or nose? Unknown Did you need to seek medical attention at a hospital or doctor's office? Unknown When did it last happen?    1940   If all above answers are "NO", may proceed with cephalosporin use.    Objective:  There were no vitals filed for this visit. Constitutional Patient is a pleasant 87 y.o. Caucasian male in NAD. AAO x 3.  Vascular Capillary fill time to digits <3 seconds b/l. DP pulses are faintly palpable b/l. PT pulse(s) are palpable b/l lower extremities. Pedal hair sparse. Lower extremity skin temperature gradient within normal limits. No pain with calf compression b/l. No edema noted b/l lower extremities. No cyanosis or clubbing noted.  Neurologic Protective sensation decreased bilaterally with 10g monofilament b/l.  Dermatologic Pedal skin is warm b/l. No open wounds b/l lower extremities. No interdigital macerations b/l lower extremities. Toenails 1-5 b/l elongated, discolored, dystrophic, thickened, crumbly with subungual debris and tenderness to dorsal palpation. Pedal skin noted to be dry and flaky b/l feet.  Orthopedic: Normal muscle strength 5/5 to all lower extremity muscle groups bilaterally. Hallux valgus with bunion  deformity noted b/l lower extremities. Hammertoe(s) noted to the 2-5 bilaterally. Utilizes rollator for ambulation assistance.   Assessment:   1. Pain due to onychomycosis of toenails of both feet    Plan:  Patient was evaluated and treated and all questions answered. Consent given for treatment as described below: -Encouraged Evan Wright to apply moisturizer to both feet on his bath/shower days. -Patient to continue soft, supportive shoe gear daily. -Toenails 1-5 b/l were debrided in length and girth with sterile nail nippers and dremel without iatrogenic bleeding.  -Patient to report any pedal injuries to medical professional immediately. -Patient/POA to call should there be question/concern in the interim.  Return in about 3 months (around 07/02/2021).  Marzetta Board, DPM

## 2021-04-06 ENCOUNTER — Ambulatory Visit: Payer: Medicare Other | Admitting: Family Medicine

## 2021-04-26 ENCOUNTER — Other Ambulatory Visit: Payer: Self-pay | Admitting: Family Medicine

## 2021-04-26 DIAGNOSIS — E782 Mixed hyperlipidemia: Secondary | ICD-10-CM | POA: Diagnosis not present

## 2021-04-26 DIAGNOSIS — I10 Essential (primary) hypertension: Secondary | ICD-10-CM

## 2021-04-27 LAB — LIPID PANEL
Chol/HDL Ratio: 2.7 ratio (ref 0.0–5.0)
Cholesterol, Total: 122 mg/dL (ref 100–199)
HDL: 45 mg/dL (ref >39)
LDL Chol Calc (NIH): 56 mg/dL (ref 0–99)
Triglycerides: 117 mg/dL (ref 0–149)
VLDL Cholesterol Cal: 21 mg/dL (ref 5–40)

## 2021-04-27 LAB — HEPATIC FUNCTION PANEL
ALT: 14 IU/L (ref 0–44)
AST: 16 IU/L (ref 0–40)
Albumin: 4.4 g/dL (ref 3.5–4.6)
Alkaline Phosphatase: 75 IU/L (ref 44–121)
Bilirubin Total: 0.7 mg/dL (ref 0.0–1.2)
Bilirubin, Direct: 0.19 mg/dL (ref 0.00–0.40)
Total Protein: 7.1 g/dL (ref 6.0–8.5)

## 2021-06-04 ENCOUNTER — Other Ambulatory Visit: Payer: Self-pay | Admitting: Family Medicine

## 2021-06-04 DIAGNOSIS — E1165 Type 2 diabetes mellitus with hyperglycemia: Secondary | ICD-10-CM

## 2021-06-13 ENCOUNTER — Other Ambulatory Visit: Payer: Self-pay | Admitting: Family Medicine

## 2021-06-13 ENCOUNTER — Telehealth: Payer: Self-pay | Admitting: Family Medicine

## 2021-06-13 DIAGNOSIS — I10 Essential (primary) hypertension: Secondary | ICD-10-CM

## 2021-06-13 NOTE — Telephone Encounter (Signed)
Advise patient:  Is  very little what I can do from here except encourage good hydration, try Pepto-Bismol and alternate with Imodium OTC.  If he has dizziness, feeling very weak, severe nausea vomiting, ongoing diarrhea or blood in the stools:    go to the ER.

## 2021-06-13 NOTE — Telephone Encounter (Signed)
Spoke w/ Pt's daughter Santiago Glad- informed of recommendations. Santiago Glad verbalized understanding.

## 2021-06-13 NOTE — Telephone Encounter (Signed)
Patient's daughter is calling because his dad has had uncontrolled diarrhea for the past 3 days and they have tried imodium to make it stop but nothing has worked so far. She was offered and appointment but she states he cannot come in or do a virtual appointment because they have no phone. She would like a call back giving her advice on what to do. Please advice.

## 2021-06-26 ENCOUNTER — Encounter: Payer: Self-pay | Admitting: Family Medicine

## 2021-06-26 ENCOUNTER — Telehealth: Payer: Self-pay | Admitting: *Deleted

## 2021-06-26 ENCOUNTER — Ambulatory Visit (INDEPENDENT_AMBULATORY_CARE_PROVIDER_SITE_OTHER): Payer: Medicare Other | Admitting: Family Medicine

## 2021-06-26 VITALS — BP 124/80 | HR 67 | Temp 97.6°F | Resp 18 | Ht 72.0 in | Wt 151.4 lb

## 2021-06-26 DIAGNOSIS — Z0001 Encounter for general adult medical examination with abnormal findings: Secondary | ICD-10-CM | POA: Diagnosis not present

## 2021-06-26 DIAGNOSIS — E039 Hypothyroidism, unspecified: Secondary | ICD-10-CM

## 2021-06-26 DIAGNOSIS — C61 Malignant neoplasm of prostate: Secondary | ICD-10-CM

## 2021-06-26 DIAGNOSIS — S065X9A Traumatic subdural hemorrhage with loss of consciousness of unspecified duration, initial encounter: Secondary | ICD-10-CM

## 2021-06-26 DIAGNOSIS — Z23 Encounter for immunization: Secondary | ICD-10-CM | POA: Diagnosis not present

## 2021-06-26 DIAGNOSIS — E1165 Type 2 diabetes mellitus with hyperglycemia: Secondary | ICD-10-CM | POA: Diagnosis not present

## 2021-06-26 DIAGNOSIS — I10 Essential (primary) hypertension: Secondary | ICD-10-CM

## 2021-06-26 DIAGNOSIS — E1169 Type 2 diabetes mellitus with other specified complication: Secondary | ICD-10-CM | POA: Diagnosis not present

## 2021-06-26 DIAGNOSIS — E785 Hyperlipidemia, unspecified: Secondary | ICD-10-CM | POA: Diagnosis not present

## 2021-06-26 DIAGNOSIS — R569 Unspecified convulsions: Secondary | ICD-10-CM

## 2021-06-26 DIAGNOSIS — R197 Diarrhea, unspecified: Secondary | ICD-10-CM

## 2021-06-26 DIAGNOSIS — I209 Angina pectoris, unspecified: Secondary | ICD-10-CM

## 2021-06-26 DIAGNOSIS — E782 Mixed hyperlipidemia: Secondary | ICD-10-CM

## 2021-06-26 HISTORY — DX: Diarrhea, unspecified: R19.7

## 2021-06-26 HISTORY — DX: Traumatic subdural hemorrhage with loss of consciousness of unspecified duration, initial encounter: S06.5X9A

## 2021-06-26 LAB — CBC WITH DIFFERENTIAL/PLATELET
Basophils Absolute: 0 10*3/uL (ref 0.0–0.1)
Basophils Relative: 0.2 % (ref 0.0–3.0)
Eosinophils Absolute: 0.1 10*3/uL (ref 0.0–0.7)
Eosinophils Relative: 1.1 % (ref 0.0–5.0)
HCT: 39.8 % (ref 39.0–52.0)
Hemoglobin: 13 g/dL (ref 13.0–17.0)
Lymphocytes Relative: 11.8 % — ABNORMAL LOW (ref 12.0–46.0)
Lymphs Abs: 0.7 10*3/uL (ref 0.7–4.0)
MCHC: 32.7 g/dL (ref 30.0–36.0)
MCV: 92.6 fl (ref 78.0–100.0)
Monocytes Absolute: 0.4 10*3/uL (ref 0.1–1.0)
Monocytes Relative: 6.1 % (ref 3.0–12.0)
Neutro Abs: 4.7 10*3/uL (ref 1.4–7.7)
Neutrophils Relative %: 80.8 % — ABNORMAL HIGH (ref 43.0–77.0)
Platelets: 210 10*3/uL (ref 150.0–400.0)
RBC: 4.3 Mil/uL (ref 4.22–5.81)
RDW: 13 % (ref 11.5–15.5)
WBC: 5.8 10*3/uL (ref 4.0–10.5)

## 2021-06-26 LAB — PSA: PSA: 0.05 ng/mL — ABNORMAL LOW (ref 0.10–4.00)

## 2021-06-26 LAB — COMPREHENSIVE METABOLIC PANEL
ALT: 14 U/L (ref 0–53)
AST: 17 U/L (ref 0–37)
Albumin: 4.1 g/dL (ref 3.5–5.2)
Alkaline Phosphatase: 64 U/L (ref 39–117)
BUN: 29 mg/dL — ABNORMAL HIGH (ref 6–23)
CO2: 24 mEq/L (ref 19–32)
Calcium: 9.8 mg/dL (ref 8.4–10.5)
Chloride: 107 mEq/L (ref 96–112)
Creatinine, Ser: 2.08 mg/dL — ABNORMAL HIGH (ref 0.40–1.50)
GFR: 26.69 mL/min — ABNORMAL LOW (ref >60.00)
Glucose, Bld: 116 mg/dL — ABNORMAL HIGH (ref 70–99)
Potassium: 4.5 mEq/L (ref 3.5–5.1)
Sodium: 140 mEq/L (ref 135–145)
Total Bilirubin: 0.8 mg/dL (ref 0.2–1.2)
Total Protein: 6.7 g/dL (ref 6.0–8.3)

## 2021-06-26 LAB — LIPID PANEL
Cholesterol: 101 mg/dL (ref 0–200)
HDL: 42.2 mg/dL (ref >39.00)
LDL Cholesterol: 33 mg/dL (ref 0–99)
NonHDL: 58.99
Total CHOL/HDL Ratio: 2
Triglycerides: 130 mg/dL (ref 0.0–149.0)
VLDL: 26 mg/dL (ref 0.0–40.0)

## 2021-06-26 LAB — TSH: TSH: 1.95 u[IU]/mL (ref 0.35–5.50)

## 2021-06-26 LAB — HEMOGLOBIN A1C: Hgb A1c MFr Bld: 7.1 % — ABNORMAL HIGH (ref 4.6–6.5)

## 2021-06-26 NOTE — Assessment & Plan Note (Signed)
Tolerating statin, encouraged heart healthy diet, avoid trans fats, minimize simple carbs and saturated fats. Increase exercise as tolerated 

## 2021-06-26 NOTE — Assessment & Plan Note (Signed)
Check psa today

## 2021-06-26 NOTE — Assessment & Plan Note (Signed)
Well controlled, no changes to meds. Encouraged heart healthy diet such as the DASH diet and exercise as tolerated.  °

## 2021-06-26 NOTE — Patient Instructions (Signed)
Preventive Care 65 Years and Older, Male °Preventive care refers to lifestyle choices and visits with your health care provider that can promote health and wellness. Preventive care visits are also called wellness exams. °What can I expect for my preventive care visit? °Counseling °During your preventive care visit, your health care provider may ask about your: °Medical history, including: °Past medical problems. °Family medical history. °History of falls. °Current health, including: °Emotional well-being. °Home life and relationship well-being. °Sexual activity. °Memory and ability to understand (cognition). °Lifestyle, including: °Alcohol, nicotine or tobacco, and drug use. °Access to firearms. °Diet, exercise, and sleep habits. °Work and work environment. °Sunscreen use. °Safety issues such as seatbelt and bike helmet use. °Physical exam °Your health care provider will check your: °Height and weight. These may be used to calculate your BMI (body mass index). BMI is a measurement that tells if you are at a healthy weight. °Waist circumference. This measures the distance around your waistline. This measurement also tells if you are at a healthy weight and may help predict your risk of certain diseases, such as type 2 diabetes and high blood pressure. °Heart rate and blood pressure. °Body temperature. °Skin for abnormal spots. °What immunizations do I need? °Vaccines are usually given at various ages, according to a schedule. Your health care provider will recommend vaccines for you based on your age, medical history, and lifestyle or other factors, such as travel or where you work. °What tests do I need? °Screening °Your health care provider may recommend screening tests for certain conditions. This may include: °Lipid and cholesterol levels. °Diabetes screening. This is done by checking your blood sugar (glucose) after you have not eaten for a while (fasting). °Hepatitis C test. °Hepatitis B test. °HIV (human  immunodeficiency virus) test. °STI (sexually transmitted infection) testing, if you are at risk. °Lung cancer screening. °Colorectal cancer screening. °Prostate cancer screening. °Abdominal aortic aneurysm (AAA) screening. You may need this if you are a current or former smoker. °Talk with your health care provider about your test results, treatment options, and if necessary, the need for more tests. °Follow these instructions at home: °Eating and drinking ° °Eat a diet that includes fresh fruits and vegetables, whole grains, lean protein, and low-fat dairy products. Limit your intake of foods with high amounts of sugar, saturated fats, and salt. °Take vitamin and mineral supplements as recommended by your health care provider. °Do not drink alcohol if your health care provider tells you not to drink. °If you drink alcohol: °Limit how much you have to 0-2 drinks a day. °Know how much alcohol is in your drink. In the [place] one drink equals one 12 oz bottle of beer (355 mL), one 5 oz glass of wine (148 mL), or one 1½ oz glass of hard liquor (44 mL). °Lifestyle °Brush your teeth every morning and night with fluoride toothpaste. Floss one time each day. °Exercise for at least 30 minutes 5 or more days each week. °Do not use any products that contain nicotine or tobacco. These products include cigarettes, chewing tobacco, and vaping devices, such as e-cigarettes. If you need help quitting, ask your health care provider. °Do not use drugs. °If you are sexually active, practice safe sex. Use a condom or other form of protection to prevent STIs. °Take aspirin only as told by your health care provider. Make sure that you understand how much to take and what form to take. Work with your health care provider to find out whether it is safe and   beneficial for you to take aspirin daily. °Ask your health care provider if you need to take a cholesterol-lowering medicine (statin). °Find healthy ways to manage stress, such  as: °Meditation, yoga, or listening to music. °Journaling. °Talking to a trusted person. °Spending time with friends and family. °Safety °Always wear your seat belt while driving or riding in a vehicle. °Do not drive: °If you have been drinking alcohol. Do not ride with someone who has been drinking. °When you are tired or distracted. °While texting. °If you have been using any mind-altering substances or drugs. °Wear a helmet and other protective equipment during sports activities. °If you have firearms in your house, make sure you follow all gun safety procedures. °Minimize exposure to UV radiation to reduce your risk of skin cancer. °What's next? °Visit your health care provider once a year for an annual wellness visit. °Ask your health care provider how often you should have your eyes and teeth checked. °Stay up to date on all vaccines. °This information is not intended to replace advice given to you by your health care provider. Make sure you discuss any questions you have with your health care provider. °Document Revised: 01/03/2021 Document Reviewed: 01/03/2021 °Elsevier Patient Education © 2022 [employer] ° °

## 2021-06-26 NOTE — Progress Notes (Signed)
Subjective:   By signing my name below, I, Shehryar Baig, attest that this documentation has been prepared under the direction and in the presence of Dr. Roma Schanz, DO. 06/26/2021    Patient ID: Evan Wright, male    DOB: [birth year], 87 y.o.   MRN: [id number]  Chief Complaint  Patient presents with   Annual Exam    Pt states fasting     HPI Patient is in today for a comprehensive physical exam. He is present with his daughter during this visit.  He complains of constipation which is follow by diarrhea. He notes when experiencing constipation he takes imodium and Pepto bismol and soon after develops diarrhea. He is not taking any new medication. He is regularly taking 13 medications daily but does not know which one would cause it. His daughter reports he is not eating properly due to having diarrhea.   Wt Readings from Last 3 Encounters:  06/26/21 151 lb 6.4 oz (68.7 kg)  03/13/21 165 lb 1.3 oz (74.9 kg)  03/08/21 163 lb (73.9 kg)   He complains of being tired. He has seen a cardiologist and found he had 3 blockages and had 1 cleared.  He also complains of having episodes anxiety. He reports being anxious over his heart conditions. His daughter reports when he is out of breath from exertion he says that he feels nervous but it soon goes away.  He denies having any fever, new moles, congestion, sore throat, new muscle pain, new joint pain, chest pain, cough, SOB, wheezing, nausea, vomiting, blood in stool, dysuria, frequency, hematuria, or headaches at this time. He is interested in receiving a flu vaccine during this visit.   Past Medical History:  Diagnosis Date   Abnormal EKG 04/02/2017   Adenomatous colon polyp    Angina pectoris ([place]) 04/2017   CAD (coronary artery disease)    a. Cath 04/29/17-> high grade pLAD with R to L collaterals, high grade 1st dig  2. Cath 05/01/17 s/p ostial to mLAD with DES x 2.    CAD (coronary artery disease), native coronary artery  04/29/2017   Cardiac murmur    Coronary artery disease    De Quervain's tenosynovitis 05/18/2012   Diverticulosis    DIZZINESS 10/15/2007   Qualifier: Diagnosis of  By: [name]     Essential hypertension 09/25/2017   Family history of malignant neoplasm of gastrointestinal tract 05/14/2011   Brother with colon cancer    GASTROENTERITIS 01/20/2008   Qualifier: Diagnosis of  By: [name], [name]     GERD (gastroesophageal reflux disease)    HIATAL HERNIA WITH REFLUX 02/11/2008   Qualifier: Diagnosis of  By: [name]     History of hiatal hernia    Hyperglycemia 09/25/2017   Hyperlipidemia    Hypothyroidism 09/25/2017   Idiopathic peripheral neuropathy 10/11/2016   Intermittent claudication ([place]) 06/02/2020   Ischemic cardiomyopathy 04/10/2017   Loss of height 06/11/2016   Mixed dyslipidemia 04/10/2017   NEVI, MULTIPLE 07/12/2010   Qualifier: Diagnosis of  By: [name]     Nonsustained ventricular tachycardia 04/24/2017   Osteoarthritis    OSTEOARTHRITIS 07/12/2010   Qualifier: Diagnosis of  By: [name]     Osteopenia 09/25/2017   Personal history of colonic polyps 11/17/2007   Qualifier: Diagnosis of  By: [name], [name]    Preventative health care 09/23/2018   Prostate cancer [facility])    PROSTATE CANCER, HX OF 10/15/2007   Qualifier:  Diagnosis of  By: [name]     Proximal leg weakness 10/11/2016   Right arm weakness 09/28/2018   SDH (subdural hematoma) 09/28/2018   Seizure-like activity ([place]) 09/28/2018   Slow transit constipation 08/10/2019   Uncontrolled diabetes mellitus with hyperglycemia ([place]) 09/23/2018   Unspecified vitamin D deficiency 02/11/2008   Qualifier: Diagnosis of  By: [name]     Weakness of both lower extremities 06/11/2016    Past Surgical History:  Procedure Laterality Date   APPENDECTOMY  1948   COLONOSCOPY     CORONARY STENT INTERVENTION N/A 05/01/2017   Procedure: CORONARY STENT INTERVENTION;  Surgeon: [name], [name],  MD;  Location: [place] [facility];  Service: Cardiovascular;  Laterality: N/A;   EYE SURGERY  2012   march and April  --Dr [name]   LEFT HEART CATH AND CORONARY ANGIOGRAPHY N/A 04/29/2017   Procedure: LEFT HEART CATH AND CORONARY ANGIOGRAPHY;  Surgeon: [name], MD;  Location: [place] [facility];  Service: Cardiovascular;  Laterality: N/A;   PROSTATECTOMY     TONSILLECTOMY      Family History  Problem Relation Age of Onset   Colon cancer Brother    Lung cancer Father    Prostate cancer Father    Colitis Mother     Social History   Socioeconomic History   Marital status: Married    Spouse name: Not on file   Number of children: 2   Years of education: Not on file   Highest education level: Not on file  Occupational History   Occupation: retired    Fish farm manager: RETIRED  Tobacco Use   Smoking status: Never   Smokeless tobacco: Never  Vaping Use   Vaping Use: Never used  Substance and Sexual Activity   Alcohol use: Never   Drug use: No   Sexual activity: Not on file  Other Topics Concern   Not on file  Social History Narrative   Right handed   Lives with family   Social Determinants of Health   Financial Resource Strain: Not on file  Food Insecurity: Not on file  Transportation Needs: Not on file  Physical Activity: Not on file  Stress: Not on file  Social Connections: Not on file  Intimate Partner Violence: Not on file    Outpatient Medications Prior to Visit  Medication Sig Dispense Refill   alendronate (FOSAMAX) 70 MG tablet Take 1 tablet (70 mg total) by mouth every 7 (seven) days. Take with a full glass of water on an empty stomach. 12 tablet 3   aspirin EC 81 MG tablet Take 1 tablet (81 mg total) by mouth daily at 12 noon.     atorvastatin (LIPITOR) 80 MG tablet TAKE ONE TABLET (80MG TOTAL) BY MOUTH DAILY 90 tablet 2   Calcium Carb-Cholecalciferol (CALCIUM+D3 PO) Take 1 tablet by mouth daily at 12 noon.     carvedilol (COREG) 3.125 MG tablet TAKE ONE  TABLET (3.125MG TOTAL) BY MOUTHTWICE DAILY WITH A MEAL 180 tablet 0   lansoprazole (PREVACID) 30 MG capsule Take 1 capsule (30 mg total) by mouth 2 (two) times daily before a meal. 90 capsule 3   levothyroxine (SYNTHROID) 100 MCG tablet Take 1 tablet (100 mcg total) by mouth daily before breakfast. Pt due for office visit 90 tablet 0   MAGNESIUM PO Take 1 tablet by mouth daily at 12 noon.     Multiple Vitamins-Minerals (PRESERVISION AREDS 2) CAPS Take 1 capsule by mouth daily at 12  noon.     nitroGLYCERIN (NITROSTAT) 0.4 MG SL tablet Place 0.4 mg under the tongue every 5 (five) minutes as needed for chest pain.     ranolazine (RANEXA) 500 MG 12 hr tablet TAKE ONE TABLET (500 MG TOTAL) BY MOUTH ONCE DAILY 90 tablet 3   Vitamin D, Ergocalciferol, (DRISDOL) 1.25 MG (50000 UNIT) CAPS capsule TAKE ONE CAPSULE (50000 UNITS TOTAL) BY MOUTH EVERY 7 DAYS 12 capsule 1   metFORMIN (GLUCOPHAGE) 500 MG tablet TAKE ONE TABLET (500MG TOTAL) BY MOUTH TWICE DAILY WITH A MEAL 180 tablet 3   ezetimibe (ZETIA) 10 MG tablet Take 1 tablet (10 mg total) by mouth daily. 90 tablet 3   No facility-administered medications prior to visit.    Allergies  Allergen Reactions   Penicillins Other (See Comments)    Unknown reaction Did it involve swelling of the face/tongue/throat, SOB, or low BP? Unknown Did it involve sudden or severe rash/hives, skin peeling, or any reaction on the inside of your mouth or nose? Unknown Did you need to seek medical attention at a hospital or doctor's office? Unknown When did it last happen?    1940   If all above answers are "NO", may proceed with cephalosporin use.    Review of Systems  Constitutional:  Negative for fever.  HENT:  Negative for congestion and sore throat.   Respiratory:  Negative for cough, shortness of breath and wheezing.   Cardiovascular:  Negative for chest pain.  Gastrointestinal:  Positive for constipation and diarrhea. Negative for blood in stool, nausea and  vomiting.  Genitourinary:  Negative for dysuria, frequency and hematuria.  Musculoskeletal:  Negative for joint pain and myalgias.  Skin:        (-)New moles  Neurological:  Negative for headaches.      Objective:    Physical Exam Constitutional:      General: He is not in acute distress.    Appearance: Normal appearance. He is not ill-appearing.  HENT:     Head: Normocephalic and atraumatic.     Right Ear: Tympanic membrane, ear canal and external ear normal.     Left Ear: Tympanic membrane, ear canal and external ear normal.  Eyes:     Extraocular Movements: Extraocular movements intact.     Pupils: Pupils are equal, round, and reactive to light.  Cardiovascular:     Rate and Rhythm: Normal rate and regular rhythm.     Heart sounds: Normal heart sounds. No murmur heard.   No gallop.  Pulmonary:     Effort: Pulmonary effort is normal. No respiratory distress.     Breath sounds: Normal breath sounds. No wheezing or rales.  Abdominal:     General: Bowel sounds are normal. There is no distension.     Palpations: Abdomen is soft.     Tenderness: There is no abdominal tenderness. There is no guarding.  Skin:    General: Skin is warm and dry.  Neurological:     Mental Status: He is alert and oriented to person, place, and time.  Psychiatric:        Behavior: Behavior normal.    BP 124/80 (BP Location: Left Arm, Patient Position: Sitting, Cuff Size: Normal)   Pulse 67   Temp 97.6 F (36.4 C) (Oral)   Resp 18   Ht 6' (1.829 m)   Wt 151 lb 6.4 oz (68.7 kg)   SpO2 98%   BMI 20.53 kg/m  Wt Readings from Last 3 Encounters:  06/26/21  151 lb 6.4 oz (68.7 kg)  03/13/21 165 lb 1.3 oz (74.9 kg)  03/08/21 163 lb (73.9 kg)    Diabetic Foot Exam - Simple   No data filed    Lab Results  Component Value Date   WBC 6.4 03/13/2021   HGB 13.4 03/13/2021   HCT 41.9 03/13/2021   PLT 246 03/13/2021   GLUCOSE 104 (H) 03/13/2021   CHOL 122 04/26/2021   TRIG 117 04/26/2021    HDL 45 04/26/2021   LDLDIRECT 165.3 10/08/2010   LDLCALC 56 04/26/2021   ALT 14 04/26/2021   AST 16 04/26/2021   NA 141 03/13/2021   K 4.6 03/13/2021   CL 104 03/13/2021   CREATININE 1.52 (H) 03/13/2021   BUN 22 03/13/2021   CO2 17 (L) 03/13/2021   TSH 4.310 03/13/2021   PSA 0.00 (L) 06/02/2020   INR 1.0 09/28/2018   HGBA1C 7.3 (H) 06/02/2020   MICROALBUR 22.1 (H) 06/02/2020    Lab Results  Component Value Date   TSH 4.310 03/13/2021   Lab Results  Component Value Date   WBC 6.4 03/13/2021   HGB 13.4 03/13/2021   HCT 41.9 03/13/2021   MCV 94 03/13/2021   PLT 246 03/13/2021   Lab Results  Component Value Date   NA 141 03/13/2021   K 4.6 03/13/2021   CO2 17 (L) 03/13/2021   GLUCOSE 104 (H) 03/13/2021   BUN 22 03/13/2021   CREATININE 1.52 (H) 03/13/2021   BILITOT 0.7 04/26/2021   ALKPHOS 75 04/26/2021   AST 16 04/26/2021   ALT 14 04/26/2021   PROT 7.1 04/26/2021   ALBUMIN 4.4 04/26/2021   CALCIUM 9.9 03/13/2021   ANIONGAP 6 09/29/2018   EGFR 42 (L) 03/13/2021   GFR 54.20 (L) 06/02/2020   Lab Results  Component Value Date   CHOL 122 04/26/2021   Lab Results  Component Value Date   HDL 45 04/26/2021   Lab Results  Component Value Date   LDLCALC 56 04/26/2021   Lab Results  Component Value Date   TRIG 117 04/26/2021   Lab Results  Component Value Date   CHOLHDL 2.7 04/26/2021   Lab Results  Component Value Date   HGBA1C 7.3 (H) 06/02/2020       Assessment & Plan:   Problem List Items Addressed This Visit       High   Essential hypertension    Well controlled, no changes to meds. Encouraged heart healthy diet such as the DASH diet and exercise as tolerated.       Hypothyroidism    chek labs today On synthroid      Mixed dyslipidemia    Tolerating statin, encouraged heart healthy diet, avoid trans fats, minimize simple carbs and saturated fats. Increase exercise as tolerated      Uncontrolled diabetes mellitus with hyperglycemia  (HCC)    Check labs today Will stop metformin due to diarrhea        Unprioritized   Angina pectoris ([place])   Diarrhea    May be due to metformin or thyroid Check labs  Stop metformin If no improvement -- refer to GI      Relevant Orders   TSH   Stool culture   Clostridium difficile EIA   Fecal occult blood, imunochemical   Prostate cancer ([place])    Check psa today      Traumatic subdural hemorrhage with loss of consciousness of unspecified duration, initial encounter ([place])    resolved  Other Visit Diagnoses     Type 2 diabetes mellitus with hyperglycemia, without long-term current use of insulin ([place])    -  Primary   Relevant Orders   CBC with Differential/Platelet   Comprehensive metabolic panel   PSA   Lipid panel   Hemoglobin A1c   Primary hypertension       Relevant Orders   CBC with Differential/Platelet   Comprehensive metabolic panel   PSA   Lipid panel   Hemoglobin A1c   Hyperlipidemia associated with type 2 diabetes mellitus ([place])       Relevant Orders   CBC with Differential/Platelet   Comprehensive metabolic panel   PSA   Lipid panel   Hemoglobin A1c   Need for influenza vaccination       Relevant Orders   Flu Vaccine QUAD High Dose(Fluad)   Convulsions, 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   I, Dr. Roma Schanz, DO, personally preformed the services described in this documentation.  All medical record entries made by the scribe were at my direction and in my presence.  I have reviewed the chart and discharge instructions (if applicable) and agree that the record reflects my personal performance and is accurate and complete. 06/26/2021   I,Shehryar Baig,acting as a scribe for Evan Held, DO.,have documented all relevant documentation on the behalf of Evan Held, DO,as directed by  Evan Held, DO while in the presence of  Evan Held, DO.   Evan Held, DO

## 2021-06-26 NOTE — Assessment & Plan Note (Signed)
Check labs today Will stop metformin due to diarrhea

## 2021-06-26 NOTE — Assessment & Plan Note (Signed)
May be due to metformin or thyroid Check labs  Stop metformin If no improvement -- refer to GI

## 2021-06-26 NOTE — Assessment & Plan Note (Signed)
chek labs today On synthroid

## 2021-06-26 NOTE — Assessment & Plan Note (Signed)
resolved 

## 2021-06-26 NOTE — Telephone Encounter (Signed)
See my chart message

## 2021-07-02 ENCOUNTER — Encounter: Payer: Self-pay | Admitting: Podiatry

## 2021-07-02 ENCOUNTER — Ambulatory Visit (INDEPENDENT_AMBULATORY_CARE_PROVIDER_SITE_OTHER): Payer: Medicare Other | Admitting: Podiatry

## 2021-07-02 ENCOUNTER — Other Ambulatory Visit: Payer: Self-pay

## 2021-07-02 DIAGNOSIS — M2011 Hallux valgus (acquired), right foot: Secondary | ICD-10-CM

## 2021-07-02 DIAGNOSIS — M79674 Pain in right toe(s): Secondary | ICD-10-CM | POA: Diagnosis not present

## 2021-07-02 DIAGNOSIS — E119 Type 2 diabetes mellitus without complications: Secondary | ICD-10-CM

## 2021-07-02 DIAGNOSIS — M79675 Pain in left toe(s): Secondary | ICD-10-CM

## 2021-07-02 DIAGNOSIS — M2041 Other hammer toe(s) (acquired), right foot: Secondary | ICD-10-CM | POA: Diagnosis not present

## 2021-07-02 DIAGNOSIS — M2042 Other hammer toe(s) (acquired), left foot: Secondary | ICD-10-CM

## 2021-07-02 DIAGNOSIS — B351 Tinea unguium: Secondary | ICD-10-CM

## 2021-07-02 DIAGNOSIS — E1142 Type 2 diabetes mellitus with diabetic polyneuropathy: Secondary | ICD-10-CM

## 2021-07-02 NOTE — Progress Notes (Signed)
ANNUAL DIABETIC FOOT EXAM  Subjective: Evan Wright presents today for for annual diabetic foot examination.  Patient relates 1-2 year h/o diabetes.  Patient denies any h/o foot wounds.  Patient denies any numbness, tingling, burning, or pins/needle sensation in feet.  Patient does not monitor blood glucose daily; he checks it once monthly.  [name], DO is patient's PCP. Last visit was 06/26/2021.  Past Medical History:  Diagnosis Date   Abnormal EKG 04/02/2017   Adenomatous colon polyp    Angina pectoris ([place]) 04/2017   CAD (coronary artery disease)    a. Cath 04/29/17-> high grade pLAD with R to L collaterals, high grade 1st dig  2. Cath 05/01/17 s/p ostial to mLAD with DES x 2.    CAD (coronary artery disease), native coronary artery 04/29/2017   Cardiac murmur    Coronary artery disease    De Quervain's tenosynovitis 05/18/2012   Diverticulosis    DIZZINESS 10/15/2007   Qualifier: Diagnosis of  By: [name], [place] hypertension 09/25/2017   Family history of malignant neoplasm of gastrointestinal tract 05/14/2011   Brother with colon cancer    GASTROENTERITIS 01/20/2008   Qualifier: Diagnosis of  By: [name], [name]     GERD (gastroesophageal reflux disease)    HIATAL HERNIA WITH REFLUX 02/11/2008   Qualifier: Diagnosis of  By: [name]     History of hiatal hernia    Hyperglycemia 09/25/2017   Hyperlipidemia    Hypothyroidism 09/25/2017   Idiopathic peripheral neuropathy 10/11/2016   Intermittent claudication ([place]) 06/02/2020   Ischemic cardiomyopathy 04/10/2017   Loss of height 06/11/2016   Mixed dyslipidemia 04/10/2017   NEVI, MULTIPLE 07/12/2010   Qualifier: Diagnosis of  By: [name]     Nonsustained ventricular tachycardia 04/24/2017   Osteoarthritis    OSTEOARTHRITIS 07/12/2010   Qualifier: Diagnosis of  By: [name]     Osteopenia 09/25/2017   Personal history of colonic polyps 11/17/2007   Qualifier: Diagnosis of   By: [name], [name]    Preventative health care 09/23/2018   Prostate cancer [facility])    PROSTATE CANCER, HX OF 10/15/2007   Qualifier: Diagnosis of  By: [name]     Proximal leg weakness 10/11/2016   Right arm weakness 09/28/2018   SDH (subdural hematoma) 09/28/2018   Seizure-like activity ([place]) 09/28/2018   Slow transit constipation 08/10/2019   Uncontrolled diabetes mellitus with hyperglycemia ([place]) 09/23/2018   Unspecified vitamin D deficiency 02/11/2008   Qualifier: Diagnosis of  By: [name]     Weakness of both lower extremities 06/11/2016   Patient Active Problem List   Diagnosis Date Noted   Traumatic subdural hemorrhage with loss of consciousness of unspecified duration, initial encounter ([name]) 06/26/2021   Diarrhea 06/26/2021   Moderate aortic regurgitation 03/13/2021   Ascending aortic aneurysm 03/13/2021   Pedal edema 03/13/2021   Adenomatous colon polyp    CAD (coronary artery disease)    Cardiac murmur    Coronary artery disease    Diverticulosis    GERD (gastroesophageal reflux disease)    History of hiatal hernia    Hyperlipidemia    Prostate cancer (HCC)    Osteoarthritis    Intermittent claudication ([place]) 06/02/2020   Slow transit constipation 08/10/2019   SDH (subdural hematoma) 09/28/2018   Seizure-like activity ([name]) 09/28/2018   Right arm weakness 09/28/2018   Preventative health care 09/23/2018   Uncontrolled diabetes mellitus with  hyperglycemia ([name]) 09/23/2018   Osteopenia 09/25/2017   Essential hypertension 09/25/2017   Hyperglycemia 09/25/2017   Hypothyroidism 09/25/2017   CAD (coronary artery disease), native coronary artery 04/29/2017   Nonsustained ventricular tachycardia 04/24/2017   Angina pectoris ([name]) 04/2017   Ischemic cardiomyopathy 04/10/2017   Mixed dyslipidemia 04/10/2017   Abnormal EKG 04/02/2017   Idiopathic peripheral neuropathy 10/11/2016   Proximal leg weakness 10/11/2016   Weakness of both lower extremities  06/11/2016   Loss of height 06/11/2016   De Quervain's tenosynovitis 05/18/2012   Family history of malignant neoplasm of gastrointestinal tract 05/14/2011   NEVI, MULTIPLE 07/12/2010   OSTEOARTHRITIS 07/12/2010   UNSPECIFIED VITAMIN D DEFICIENCY 02/11/2008   HIATAL HERNIA WITH REFLUX 02/11/2008   GASTROENTERITIS 01/20/2008   PERSONAL HISTORY OF COLONIC POLYPS 11/17/2007   DIZZINESS 10/15/2007   PROSTATE CANCER, HX OF 10/15/2007   Past Surgical History:  Procedure Laterality Date   APPENDECTOMY  1948   COLONOSCOPY     CORONARY STENT INTERVENTION N/A 05/01/2017   Procedure: CORONARY STENT INTERVENTION;  Surgeon: [name], [name], MD;  Location: [place] [facility];  Service: Cardiovascular;  Laterality: N/A;   EYE SURGERY  2012   march and April  --Dr [name]   LEFT HEART CATH AND CORONARY ANGIOGRAPHY N/A 04/29/2017   Procedure: LEFT HEART CATH AND CORONARY ANGIOGRAPHY;  Surgeon: [name], MD;  Location: [place] [facility];  Service: Cardiovascular;  Laterality: N/A;   PROSTATECTOMY     TONSILLECTOMY     Current Outpatient Medications on File Prior to Visit  Medication Sig Dispense Refill   alendronate (FOSAMAX) 70 MG tablet Take 1 tablet (70 mg total) by mouth every 7 (seven) days. Take with a full glass of water on an empty stomach. 12 tablet 3   aspirin EC 81 MG tablet Take 1 tablet (81 mg total) by mouth daily at 12 noon.     atorvastatin (LIPITOR) 80 MG tablet TAKE ONE TABLET (80MG  TOTAL) BY MOUTH DAILY 90 tablet 2   Calcium Carb-Cholecalciferol (CALCIUM+D3 PO) Take 1 tablet by mouth daily at 12 noon.     carvedilol (COREG) 3.125 MG tablet TAKE ONE TABLET (3.125MG  TOTAL) BY MOUTHTWICE DAILY WITH A MEAL 180 tablet 0   ezetimibe (ZETIA) 10 MG tablet Take 1 tablet (10 mg total) by mouth daily. 90 tablet 3   lansoprazole (PREVACID) 30 MG capsule Take 1 capsule (30 mg total) by mouth 2 (two) times daily before a meal. 90 capsule 3   levothyroxine (SYNTHROID) 100 MCG tablet Take 1  tablet (100 mcg total) by mouth daily before breakfast. Pt due for office visit 90 tablet 0   MAGNESIUM PO Take 1 tablet by mouth daily at 12 noon.     Multiple Vitamins-Minerals (PRESERVISION AREDS 2) CAPS Take 1 capsule by mouth daily at 12 noon.     nitroGLYCERIN (NITROSTAT) 0.4 MG SL tablet Place 0.4 mg under the tongue every 5 (five) minutes as needed for chest pain.     ranolazine (RANEXA) 500 MG 12 hr tablet TAKE ONE TABLET (500 MG TOTAL) BY MOUTH ONCE DAILY 90 tablet 3   Vitamin D, Ergocalciferol, (DRISDOL) 1.25 MG (50000 UNIT) CAPS capsule TAKE ONE CAPSULE (50000 UNITS TOTAL) BY MOUTH EVERY 7 DAYS 12 capsule 1   No current facility-administered medications on file prior to visit.    Allergies  Allergen Reactions   Penicillins Other (See Comments)    Unknown reaction Did it involve swelling of the face/tongue/throat, SOB, or low  BP? Unknown Did it involve sudden or severe rash/hives, skin peeling, or any reaction on the inside of your mouth or nose? Unknown Did you need to seek medical attention at a hospital or doctor's office? Unknown When did it last happen?    1940   If all above answers are "NO", may proceed with cephalosporin use.   Social History   Occupational History   Occupation: retired    Fish farm manager: RETIRED  Tobacco Use   Smoking status: Never   Smokeless tobacco: Never  Vaping Use   Vaping Use: Never used  Substance and Sexual Activity   Alcohol use: Never   Drug use: No   Sexual activity: Not on file   Family History  Problem Relation Age of Onset   Colon cancer Brother    Lung cancer Father    Prostate cancer Father    Colitis Mother    Immunization History  Administered Date(s) Administered   Fluad Quad(high Dose 65+) 04/30/2019, 06/02/2020, 06/26/2021   Influenza Split 05/18/2012, 08/06/2014   Influenza Whole 05/09/2010   Influenza, High Dose Seasonal PF 06/11/2016, 05/02/2017, 04/13/2018   Influenza-Unspecified 05/22/2013   PFIZER(Purple  Top)SARS-COV-2 Vaccination 08/26/2019, 09/16/2019, 06/09/2020   Pneumococcal Conjugate-13 08/06/2014   Pneumococcal Polysaccharide-23 07/03/2005     Review of Systems: Negative except as noted in the HPI.   Objective: There were no vitals filed for this visit.  ANES RIGEL is a pleasant 87 y.o. male in NAD. AAO X 3.  Vascular Examination: CFT <3 seconds b/l LE. Palpable PT pulse(s) b/l LE. Faintly palpable DP pulses b/l LE. Pedal hair sparse. No pain with calf compression b/l. No edema noted b/l LE. No cyanosis or clubbing noted b/l LE.  Dermatological Examination: No open wounds b/l LE. No interdigital macerations noted b/l LE. Toenails 1-5 b/l elongated, discolored, dystrophic, thickened, crumbly with subungual debris and tenderness to dorsal palpation. Pedal skin noted to be dry b/l lower extremities.  Musculoskeletal Examination: Muscle strength 5/5 to all lower extremity muscle groups bilaterally. HAV with bunion deformity noted b/l LE. Hammertoe deformity noted 2-5 b/l.  Footwear Assessment: Does the patient wear appropriate shoes? Yes. Does the patient need inserts/orthotics? No.  Neurological Examination: Protective sensation decreased with 10 gram monofilament b/l.  Hemoglobin A1C Latest Ref Rng & Units 06/26/2021  HGBA1C 4.6 - 6.5 % 7.1(H)  Some recent data might be hidden   Assessment: 1. Pain due to onychomycosis of toenails of both feet   2. Acquired hammertoes of both feet   3. Hallux valgus, acquired, bilateral   4. Diabetic peripheral neuropathy associated with type 2 diabetes mellitus ([place])   5. Encounter for diabetic foot exam ([place])      ADA Risk Categorization: High Risk  Patient has one or more of the following: Loss of protective sensation Absent pedal pulses Severe Foot deformity History of foot ulcer  Plan: -Diabetic foot examination performed today. -Continue foot and shoe inspections daily. Monitor blood glucose per PCP/Endocrinologist's  recommendations. -Mycotic toenails 1-5 bilaterally were debrided in length and girth with sterile nail nippers and dremel without incident. -Patient/POA to call should there be question/concern in the interim.  Return in about 3 months (around 09/30/2021).  Marzetta Board, DPM

## 2021-07-05 ENCOUNTER — Other Ambulatory Visit: Payer: Self-pay | Admitting: Family Medicine

## 2021-07-05 DIAGNOSIS — N289 Disorder of kidney and ureter, unspecified: Secondary | ICD-10-CM

## 2021-07-06 ENCOUNTER — Encounter: Payer: Self-pay | Admitting: Podiatry

## 2021-07-09 ENCOUNTER — Other Ambulatory Visit: Payer: Medicare Other

## 2021-07-09 DIAGNOSIS — R197 Diarrhea, unspecified: Secondary | ICD-10-CM

## 2021-07-09 NOTE — Addendum Note (Signed)
Addended by: Manuela Schwartz on: 07/09/2021 12:46 PM   Modules accepted: Orders

## 2021-07-13 LAB — STOOL CULTURE: E coli, Shiga toxin Assay: NEGATIVE

## 2021-07-17 ENCOUNTER — Other Ambulatory Visit: Payer: Self-pay | Admitting: Cardiology

## 2021-07-17 DIAGNOSIS — E782 Mixed hyperlipidemia: Secondary | ICD-10-CM

## 2021-07-25 ENCOUNTER — Other Ambulatory Visit: Payer: Self-pay

## 2021-07-25 ENCOUNTER — Inpatient Hospital Stay (HOSPITAL_BASED_OUTPATIENT_CLINIC_OR_DEPARTMENT_OTHER)
Admission: EM | Admit: 2021-07-25 | Discharge: 2021-07-31 | DRG: 178 | Disposition: A | Discharge status: Skilled Nursing Facility | Payer: Medicare Other | Attending: Family Medicine | Admitting: Family Medicine

## 2021-07-25 ENCOUNTER — Encounter (HOSPITAL_BASED_OUTPATIENT_CLINIC_OR_DEPARTMENT_OTHER): Payer: Self-pay | Admitting: Emergency Medicine

## 2021-07-25 DIAGNOSIS — Z9181 History of falling: Secondary | ICD-10-CM | POA: Diagnosis not present

## 2021-07-25 DIAGNOSIS — E1122 Type 2 diabetes mellitus with diabetic chronic kidney disease: Secondary | ICD-10-CM | POA: Diagnosis not present

## 2021-07-25 DIAGNOSIS — E782 Mixed hyperlipidemia: Secondary | ICD-10-CM | POA: Diagnosis present

## 2021-07-25 DIAGNOSIS — Z8042 Family history of malignant neoplasm of prostate: Secondary | ICD-10-CM

## 2021-07-25 DIAGNOSIS — I2510 Atherosclerotic heart disease of native coronary artery without angina pectoris: Secondary | ICD-10-CM | POA: Diagnosis present

## 2021-07-25 DIAGNOSIS — N200 Calculus of kidney: Secondary | ICD-10-CM | POA: Diagnosis present

## 2021-07-25 DIAGNOSIS — N189 Chronic kidney disease, unspecified: Secondary | ICD-10-CM | POA: Diagnosis present

## 2021-07-25 DIAGNOSIS — K5289 Other specified noninfective gastroenteritis and colitis: Secondary | ICD-10-CM | POA: Diagnosis present

## 2021-07-25 DIAGNOSIS — Z955 Presence of coronary angioplasty implant and graft: Secondary | ICD-10-CM

## 2021-07-25 DIAGNOSIS — R1312 Dysphagia, oropharyngeal phase: Secondary | ICD-10-CM | POA: Diagnosis present

## 2021-07-25 DIAGNOSIS — N179 Acute kidney failure, unspecified: Secondary | ICD-10-CM | POA: Diagnosis not present

## 2021-07-25 DIAGNOSIS — E1140 Type 2 diabetes mellitus with diabetic neuropathy, unspecified: Secondary | ICD-10-CM | POA: Diagnosis present

## 2021-07-25 DIAGNOSIS — J189 Pneumonia, unspecified organism: Secondary | ICD-10-CM | POA: Diagnosis present

## 2021-07-25 DIAGNOSIS — E559 Vitamin D deficiency, unspecified: Secondary | ICD-10-CM | POA: Diagnosis present

## 2021-07-25 DIAGNOSIS — Z66 Do not resuscitate: Secondary | ICD-10-CM | POA: Diagnosis not present

## 2021-07-25 DIAGNOSIS — Z86010 Personal history of colonic polyps: Secondary | ICD-10-CM

## 2021-07-25 DIAGNOSIS — K5900 Constipation, unspecified: Secondary | ICD-10-CM

## 2021-07-25 DIAGNOSIS — E785 Hyperlipidemia, unspecified: Secondary | ICD-10-CM | POA: Diagnosis not present

## 2021-07-25 DIAGNOSIS — R339 Retention of urine, unspecified: Secondary | ICD-10-CM | POA: Diagnosis not present

## 2021-07-25 DIAGNOSIS — K5641 Fecal impaction: Secondary | ICD-10-CM | POA: Diagnosis not present

## 2021-07-25 DIAGNOSIS — N1832 Chronic kidney disease, stage 3b: Secondary | ICD-10-CM | POA: Diagnosis not present

## 2021-07-25 DIAGNOSIS — E039 Hypothyroidism, unspecified: Secondary | ICD-10-CM | POA: Diagnosis not present

## 2021-07-25 DIAGNOSIS — Z7982 Long term (current) use of aspirin: Secondary | ICD-10-CM

## 2021-07-25 DIAGNOSIS — E876 Hypokalemia: Secondary | ICD-10-CM | POA: Diagnosis present

## 2021-07-25 DIAGNOSIS — M6281 Muscle weakness (generalized): Secondary | ICD-10-CM | POA: Diagnosis not present

## 2021-07-25 DIAGNOSIS — R32 Unspecified urinary incontinence: Secondary | ICD-10-CM | POA: Diagnosis present

## 2021-07-25 DIAGNOSIS — E1151 Type 2 diabetes mellitus with diabetic peripheral angiopathy without gangrene: Secondary | ICD-10-CM | POA: Diagnosis present

## 2021-07-25 DIAGNOSIS — Z880 Allergy status to penicillin: Secondary | ICD-10-CM

## 2021-07-25 DIAGNOSIS — Z801 Family history of malignant neoplasm of trachea, bronchus and lung: Secondary | ICD-10-CM

## 2021-07-25 DIAGNOSIS — K219 Gastro-esophageal reflux disease without esophagitis: Secondary | ICD-10-CM | POA: Diagnosis present

## 2021-07-25 DIAGNOSIS — J690 Pneumonitis due to inhalation of food and vomit: Principal | ICD-10-CM | POA: Diagnosis present

## 2021-07-25 DIAGNOSIS — F05 Delirium due to known physiological condition: Secondary | ICD-10-CM | POA: Diagnosis not present

## 2021-07-25 DIAGNOSIS — I351 Nonrheumatic aortic (valve) insufficiency: Secondary | ICD-10-CM | POA: Diagnosis present

## 2021-07-25 DIAGNOSIS — L602 Onychogryphosis: Secondary | ICD-10-CM | POA: Diagnosis present

## 2021-07-25 DIAGNOSIS — E872 Acidosis, unspecified: Secondary | ICD-10-CM | POA: Diagnosis present

## 2021-07-25 DIAGNOSIS — Z7983 Long term (current) use of bisphosphonates: Secondary | ICD-10-CM

## 2021-07-25 DIAGNOSIS — E1159 Type 2 diabetes mellitus with other circulatory complications: Secondary | ICD-10-CM | POA: Diagnosis present

## 2021-07-25 DIAGNOSIS — Z8546 Personal history of malignant neoplasm of prostate: Secondary | ICD-10-CM

## 2021-07-25 DIAGNOSIS — I10 Essential (primary) hypertension: Secondary | ICD-10-CM | POA: Diagnosis not present

## 2021-07-25 DIAGNOSIS — S0990XA Unspecified injury of head, initial encounter: Secondary | ICD-10-CM | POA: Diagnosis not present

## 2021-07-25 DIAGNOSIS — I152 Hypertension secondary to endocrine disorders: Secondary | ICD-10-CM | POA: Diagnosis present

## 2021-07-25 DIAGNOSIS — Z79899 Other long term (current) drug therapy: Secondary | ICD-10-CM

## 2021-07-25 DIAGNOSIS — E1169 Type 2 diabetes mellitus with other specified complication: Secondary | ICD-10-CM | POA: Diagnosis present

## 2021-07-25 DIAGNOSIS — Z800 Family history of malignant neoplasm of digestive organs: Secondary | ICD-10-CM

## 2021-07-25 DIAGNOSIS — I255 Ischemic cardiomyopathy: Secondary | ICD-10-CM | POA: Diagnosis not present

## 2021-07-25 DIAGNOSIS — J069 Acute upper respiratory infection, unspecified: Secondary | ICD-10-CM | POA: Diagnosis present

## 2021-07-25 DIAGNOSIS — K6289 Other specified diseases of anus and rectum: Secondary | ICD-10-CM | POA: Diagnosis present

## 2021-07-25 DIAGNOSIS — E1142 Type 2 diabetes mellitus with diabetic polyneuropathy: Secondary | ICD-10-CM | POA: Diagnosis present

## 2021-07-25 DIAGNOSIS — Z79890 Hormone replacement therapy (postmenopausal): Secondary | ICD-10-CM

## 2021-07-25 DIAGNOSIS — I7121 Aneurysm of the ascending aorta, without rupture: Secondary | ICD-10-CM | POA: Diagnosis not present

## 2021-07-25 DIAGNOSIS — M2578 Osteophyte, vertebrae: Secondary | ICD-10-CM | POA: Diagnosis not present

## 2021-07-25 DIAGNOSIS — R41841 Cognitive communication deficit: Secondary | ICD-10-CM | POA: Diagnosis not present

## 2021-07-25 DIAGNOSIS — I129 Hypertensive chronic kidney disease with stage 1 through stage 4 chronic kidney disease, or unspecified chronic kidney disease: Secondary | ICD-10-CM | POA: Diagnosis not present

## 2021-07-25 DIAGNOSIS — Z20822 Contact with and (suspected) exposure to covid-19: Secondary | ICD-10-CM | POA: Diagnosis present

## 2021-07-25 DIAGNOSIS — R739 Hyperglycemia, unspecified: Secondary | ICD-10-CM | POA: Diagnosis not present

## 2021-07-25 DIAGNOSIS — E119 Type 2 diabetes mellitus without complications: Secondary | ICD-10-CM

## 2021-07-25 DIAGNOSIS — Z8719 Personal history of other diseases of the digestive system: Secondary | ICD-10-CM

## 2021-07-25 DIAGNOSIS — Z466 Encounter for fitting and adjustment of urinary device: Secondary | ICD-10-CM | POA: Diagnosis not present

## 2021-07-25 DIAGNOSIS — Z741 Need for assistance with personal care: Secondary | ICD-10-CM | POA: Diagnosis not present

## 2021-07-25 DIAGNOSIS — Z8249 Family history of ischemic heart disease and other diseases of the circulatory system: Secondary | ICD-10-CM

## 2021-07-25 DIAGNOSIS — R296 Repeated falls: Secondary | ICD-10-CM | POA: Diagnosis present

## 2021-07-25 LAB — CBG MONITORING, ED: Glucose-Capillary: 174 mg/dL — ABNORMAL HIGH (ref 70–99)

## 2021-07-25 NOTE — ED Triage Notes (Signed)
EMS reports pt had fall yesterday. Pt reports frequent falls due to neuropathy. Pt also reports urinary retention. Pt was incontinent urine on EMS stretcher. Urine has a strong smell.

## 2021-07-26 ENCOUNTER — Emergency Department (HOSPITAL_BASED_OUTPATIENT_CLINIC_OR_DEPARTMENT_OTHER): Payer: Medicare Other

## 2021-07-26 DIAGNOSIS — Z86010 Personal history of colonic polyps: Secondary | ICD-10-CM | POA: Diagnosis not present

## 2021-07-26 DIAGNOSIS — E1140 Type 2 diabetes mellitus with diabetic neuropathy, unspecified: Secondary | ICD-10-CM | POA: Diagnosis not present

## 2021-07-26 DIAGNOSIS — N1832 Chronic kidney disease, stage 3b: Secondary | ICD-10-CM | POA: Diagnosis not present

## 2021-07-26 DIAGNOSIS — E1169 Type 2 diabetes mellitus with other specified complication: Secondary | ICD-10-CM | POA: Diagnosis not present

## 2021-07-26 DIAGNOSIS — N189 Chronic kidney disease, unspecified: Secondary | ICD-10-CM

## 2021-07-26 DIAGNOSIS — K5641 Fecal impaction: Secondary | ICD-10-CM | POA: Diagnosis present

## 2021-07-26 DIAGNOSIS — J189 Pneumonia, unspecified organism: Secondary | ICD-10-CM | POA: Diagnosis present

## 2021-07-26 DIAGNOSIS — Z9181 History of falling: Secondary | ICD-10-CM | POA: Diagnosis not present

## 2021-07-26 DIAGNOSIS — N179 Acute kidney failure, unspecified: Secondary | ICD-10-CM

## 2021-07-26 DIAGNOSIS — R296 Repeated falls: Secondary | ICD-10-CM

## 2021-07-26 DIAGNOSIS — J690 Pneumonitis due to inhalation of food and vomit: Secondary | ICD-10-CM | POA: Diagnosis not present

## 2021-07-26 DIAGNOSIS — E1122 Type 2 diabetes mellitus with diabetic chronic kidney disease: Secondary | ICD-10-CM | POA: Diagnosis not present

## 2021-07-26 DIAGNOSIS — Z66 Do not resuscitate: Secondary | ICD-10-CM | POA: Diagnosis not present

## 2021-07-26 DIAGNOSIS — E1151 Type 2 diabetes mellitus with diabetic peripheral angiopathy without gangrene: Secondary | ICD-10-CM | POA: Diagnosis not present

## 2021-07-26 DIAGNOSIS — E876 Hypokalemia: Secondary | ICD-10-CM

## 2021-07-26 DIAGNOSIS — E782 Mixed hyperlipidemia: Secondary | ICD-10-CM | POA: Diagnosis not present

## 2021-07-26 DIAGNOSIS — I2510 Atherosclerotic heart disease of native coronary artery without angina pectoris: Secondary | ICD-10-CM | POA: Diagnosis not present

## 2021-07-26 DIAGNOSIS — J069 Acute upper respiratory infection, unspecified: Secondary | ICD-10-CM | POA: Diagnosis present

## 2021-07-26 DIAGNOSIS — Z20822 Contact with and (suspected) exposure to covid-19: Secondary | ICD-10-CM | POA: Diagnosis not present

## 2021-07-26 DIAGNOSIS — E872 Acidosis, unspecified: Secondary | ICD-10-CM | POA: Diagnosis not present

## 2021-07-26 DIAGNOSIS — I152 Hypertension secondary to endocrine disorders: Secondary | ICD-10-CM | POA: Diagnosis not present

## 2021-07-26 DIAGNOSIS — R32 Unspecified urinary incontinence: Secondary | ICD-10-CM | POA: Diagnosis not present

## 2021-07-26 DIAGNOSIS — E039 Hypothyroidism, unspecified: Secondary | ICD-10-CM | POA: Diagnosis not present

## 2021-07-26 DIAGNOSIS — Z955 Presence of coronary angioplasty implant and graft: Secondary | ICD-10-CM | POA: Diagnosis not present

## 2021-07-26 DIAGNOSIS — E1142 Type 2 diabetes mellitus with diabetic polyneuropathy: Secondary | ICD-10-CM | POA: Diagnosis not present

## 2021-07-26 DIAGNOSIS — I7121 Aneurysm of the ascending aorta, without rupture: Secondary | ICD-10-CM | POA: Diagnosis not present

## 2021-07-26 DIAGNOSIS — R339 Retention of urine, unspecified: Secondary | ICD-10-CM

## 2021-07-26 DIAGNOSIS — I351 Nonrheumatic aortic (valve) insufficiency: Secondary | ICD-10-CM | POA: Diagnosis not present

## 2021-07-26 DIAGNOSIS — F05 Delirium due to known physiological condition: Secondary | ICD-10-CM | POA: Diagnosis not present

## 2021-07-26 DIAGNOSIS — K219 Gastro-esophageal reflux disease without esophagitis: Secondary | ICD-10-CM | POA: Diagnosis not present

## 2021-07-26 HISTORY — DX: Acute kidney failure, unspecified: N17.9

## 2021-07-26 HISTORY — DX: Retention of urine, unspecified: R33.9

## 2021-07-26 HISTORY — DX: Chronic kidney disease, unspecified: N18.9

## 2021-07-26 HISTORY — DX: Hypokalemia: E87.6

## 2021-07-26 HISTORY — DX: Pneumonia, unspecified organism: J18.9

## 2021-07-26 HISTORY — DX: Repeated falls: R29.6

## 2021-07-26 HISTORY — DX: Fecal impaction: K56.41

## 2021-07-26 LAB — URINALYSIS, ROUTINE W REFLEX MICROSCOPIC
Bilirubin Urine: NEGATIVE
Glucose, UA: 100 mg/dL — AB
Ketones, ur: NEGATIVE mg/dL
Leukocytes,Ua: NEGATIVE
Nitrite: NEGATIVE
Protein, ur: 30 mg/dL — AB
Specific Gravity, Urine: 1.015 (ref 1.005–1.030)
pH: 5.5 (ref 5.0–8.0)

## 2021-07-26 LAB — CBC
HCT: 38 % — ABNORMAL LOW (ref 39.0–52.0)
Hemoglobin: 12.3 g/dL — ABNORMAL LOW (ref 13.0–17.0)
MCH: 30.9 pg (ref 26.0–34.0)
MCHC: 32.4 g/dL (ref 30.0–36.0)
MCV: 95.5 fL (ref 80.0–100.0)
Platelets: 225 10*3/uL (ref 150–400)
RBC: 3.98 MIL/uL — ABNORMAL LOW (ref 4.22–5.81)
RDW: 12.8 % (ref 11.5–15.5)
WBC: 17.1 10*3/uL — ABNORMAL HIGH (ref 4.0–10.5)
nRBC: 0 % (ref 0.0–0.2)

## 2021-07-26 LAB — CBC WITH DIFFERENTIAL/PLATELET
Abs Immature Granulocytes: 0.1 10*3/uL — ABNORMAL HIGH (ref 0.00–0.07)
Basophils Absolute: 0 10*3/uL (ref 0.0–0.1)
Basophils Relative: 0 %
Eosinophils Absolute: 0.2 10*3/uL (ref 0.0–0.5)
Eosinophils Relative: 1 %
HCT: 35.4 % — ABNORMAL LOW (ref 39.0–52.0)
Hemoglobin: 12.2 g/dL — ABNORMAL LOW (ref 13.0–17.0)
Immature Granulocytes: 1 %
Lymphocytes Relative: 4 %
Lymphs Abs: 0.8 10*3/uL (ref 0.7–4.0)
MCH: 31.4 pg (ref 26.0–34.0)
MCHC: 34.5 g/dL (ref 30.0–36.0)
MCV: 91 fL (ref 80.0–100.0)
Monocytes Absolute: 1 10*3/uL (ref 0.1–1.0)
Monocytes Relative: 5 %
Neutro Abs: 18.3 10*3/uL — ABNORMAL HIGH (ref 1.7–7.7)
Neutrophils Relative %: 89 %
Platelets: 251 10*3/uL (ref 150–400)
RBC: 3.89 MIL/uL — ABNORMAL LOW (ref 4.22–5.81)
RDW: 12.8 % (ref 11.5–15.5)
Smear Review: UNDETERMINED
WBC: 20.4 10*3/uL — ABNORMAL HIGH (ref 4.0–10.5)
nRBC: 0 % (ref 0.0–0.2)

## 2021-07-26 LAB — COMPREHENSIVE METABOLIC PANEL
ALT: 12 U/L (ref 0–44)
AST: 19 U/L (ref 15–41)
Albumin: 2.8 g/dL — ABNORMAL LOW (ref 3.5–5.0)
Alkaline Phosphatase: 55 U/L (ref 38–126)
Anion gap: 15 (ref 5–15)
BUN: 30 mg/dL — ABNORMAL HIGH (ref 8–23)
CO2: 16 mmol/L — ABNORMAL LOW (ref 22–32)
Calcium: 8.7 mg/dL — ABNORMAL LOW (ref 8.9–10.3)
Chloride: 108 mmol/L (ref 98–111)
Creatinine, Ser: 2 mg/dL — ABNORMAL HIGH (ref 0.61–1.24)
GFR, Estimated: 30 mL/min — ABNORMAL LOW (ref >60)
Glucose, Bld: 197 mg/dL — ABNORMAL HIGH (ref 70–99)
Potassium: 2.9 mmol/L — ABNORMAL LOW (ref 3.5–5.1)
Sodium: 139 mmol/L (ref 135–145)
Total Bilirubin: 1.2 mg/dL (ref 0.3–1.2)
Total Protein: 6.4 g/dL — ABNORMAL LOW (ref 6.5–8.1)

## 2021-07-26 LAB — BASIC METABOLIC PANEL
Anion gap: 10 (ref 5–15)
BUN: 34 mg/dL — ABNORMAL HIGH (ref 8–23)
CO2: 21 mmol/L — ABNORMAL LOW (ref 22–32)
Calcium: 8.7 mg/dL — ABNORMAL LOW (ref 8.9–10.3)
Chloride: 112 mmol/L — ABNORMAL HIGH (ref 98–111)
Creatinine, Ser: 1.78 mg/dL — ABNORMAL HIGH (ref 0.61–1.24)
GFR, Estimated: 35 mL/min — ABNORMAL LOW (ref >60)
Glucose, Bld: 157 mg/dL — ABNORMAL HIGH (ref 70–99)
Potassium: 3.3 mmol/L — ABNORMAL LOW (ref 3.5–5.1)
Sodium: 143 mmol/L (ref 135–145)

## 2021-07-26 LAB — URINALYSIS, MICROSCOPIC (REFLEX)

## 2021-07-26 LAB — RESP PANEL BY RT-PCR (FLU A&B, COVID) ARPGX2
Influenza A by PCR: NEGATIVE
Influenza B by PCR: NEGATIVE
SARS Coronavirus 2 by RT PCR: NEGATIVE

## 2021-07-26 LAB — MAGNESIUM: Magnesium: 1.7 mg/dL (ref 1.7–2.4)

## 2021-07-26 MED ORDER — SODIUM CHLORIDE 0.9 % IV BOLUS
1000.0000 mL | Freq: Once | INTRAVENOUS | Status: AC
  Administered 2021-07-26: 1000 mL via INTRAVENOUS

## 2021-07-26 MED ORDER — ENOXAPARIN SODIUM 30 MG/0.3ML IJ SOSY
30.0000 mg | PREFILLED_SYRINGE | INTRAMUSCULAR | Status: DC
  Administered 2021-07-26 – 2021-07-31 (×6): 30 mg via SUBCUTANEOUS
  Filled 2021-07-26 (×6): qty 0.3

## 2021-07-26 MED ORDER — ENOXAPARIN SODIUM 40 MG/0.4ML IJ SOSY
30.0000 mg | PREFILLED_SYRINGE | Freq: Every day | INTRAMUSCULAR | Status: DC
  Filled 2021-07-26: qty 0.4

## 2021-07-26 MED ORDER — POTASSIUM CHLORIDE IN NACL 40-0.9 MEQ/L-% IV SOLN
INTRAVENOUS | Status: AC
  Filled 2021-07-26 (×2): qty 1000

## 2021-07-26 MED ORDER — CHLORHEXIDINE GLUCONATE CLOTH 2 % EX PADS
6.0000 | MEDICATED_PAD | Freq: Every day | CUTANEOUS | Status: DC
  Administered 2021-07-27 – 2021-07-31 (×5): 6 via TOPICAL

## 2021-07-26 MED ORDER — POTASSIUM CHLORIDE 20 MEQ PO PACK
40.0000 meq | PACK | Freq: Once | ORAL | Status: AC
  Administered 2021-07-26: 40 meq via ORAL
  Filled 2021-07-26: qty 2

## 2021-07-26 MED ORDER — ENOXAPARIN SODIUM 30 MG/0.3ML IJ SOSY
30.0000 mg | PREFILLED_SYRINGE | Freq: Every day | INTRAMUSCULAR | Status: DC
  Filled 2021-07-26: qty 0.3

## 2021-07-26 MED ORDER — RANOLAZINE ER 500 MG PO TB12
500.0000 mg | ORAL_TABLET | Freq: Every day | ORAL | Status: DC
  Administered 2021-07-27 – 2021-07-31 (×5): 500 mg via ORAL
  Filled 2021-07-26 (×5): qty 1

## 2021-07-26 MED ORDER — ACETAMINOPHEN 325 MG PO TABS
650.0000 mg | ORAL_TABLET | Freq: Four times a day (QID) | ORAL | Status: DC | PRN
  Administered 2021-07-27 – 2021-07-29 (×2): 650 mg via ORAL
  Filled 2021-07-26: qty 2

## 2021-07-26 MED ORDER — SODIUM CHLORIDE 0.9 % IV SOLN
2.0000 g | INTRAVENOUS | Status: DC
  Administered 2021-07-26 – 2021-07-28 (×3): 2 g via INTRAVENOUS
  Filled 2021-07-26 (×3): qty 20

## 2021-07-26 MED ORDER — ACETAMINOPHEN 650 MG RE SUPP: 650.0000 mg | Freq: Four times a day (QID) | RECTAL | Status: DC | PRN

## 2021-07-26 MED ORDER — LEVOTHYROXINE SODIUM 100 MCG PO TABS
100.0000 ug | ORAL_TABLET | Freq: Every day | ORAL | Status: DC
  Administered 2021-07-27 – 2021-07-31 (×5): 100 ug via ORAL
  Filled 2021-07-26 (×5): qty 1

## 2021-07-26 MED ORDER — POTASSIUM CHLORIDE 20 MEQ PO PACK
40.0000 meq | PACK | Freq: Once | ORAL | Status: AC
  Administered 2021-07-27: 40 meq via ORAL
  Filled 2021-07-26: qty 2

## 2021-07-26 MED ORDER — INSULIN ASPART 100 UNIT/ML IJ SOLN
0.0000 [IU] | Freq: Three times a day (TID) | INTRAMUSCULAR | Status: DC
  Administered 2021-07-27 – 2021-07-29 (×7): 1 [IU] via SUBCUTANEOUS
  Administered 2021-07-29: 2 [IU] via SUBCUTANEOUS
  Administered 2021-07-30: 1 [IU] via SUBCUTANEOUS
  Administered 2021-07-30: 2 [IU] via SUBCUTANEOUS
  Administered 2021-07-30 – 2021-07-31 (×3): 1 [IU] via SUBCUTANEOUS

## 2021-07-26 MED ORDER — SODIUM CHLORIDE 0.9 % IV SOLN
2.0000 g | INTRAVENOUS | Status: DC
  Administered 2021-07-26: 2 g via INTRAVENOUS
  Filled 2021-07-26 (×2): qty 2

## 2021-07-26 MED ORDER — ONDANSETRON HCL 4 MG PO TABS: 4.0000 mg | ORAL_TABLET | Freq: Four times a day (QID) | ORAL | Status: DC | PRN

## 2021-07-26 MED ORDER — ONDANSETRON HCL 4 MG/2ML IJ SOLN: 4.0000 mg | Freq: Four times a day (QID) | INTRAMUSCULAR | Status: DC | PRN

## 2021-07-26 MED ORDER — EZETIMIBE 10 MG PO TABS: 10.0000 mg | ORAL_TABLET | Freq: Every day | ORAL | Status: DC

## 2021-07-26 MED ORDER — CARVEDILOL 3.125 MG PO TABS
3.1250 mg | ORAL_TABLET | Freq: Two times a day (BID) | ORAL | Status: DC
  Administered 2021-07-27 – 2021-07-31 (×10): 3.125 mg via ORAL
  Filled 2021-07-26 (×10): qty 1

## 2021-07-26 MED ORDER — ATORVASTATIN CALCIUM 40 MG PO TABS
80.0000 mg | ORAL_TABLET | Freq: Every day | ORAL | Status: DC
  Administered 2021-07-27 – 2021-07-31 (×5): 80 mg via ORAL
  Filled 2021-07-26 (×5): qty 2

## 2021-07-26 MED ORDER — ASPIRIN EC 81 MG PO TBEC
81.0000 mg | DELAYED_RELEASE_TABLET | Freq: Every day | ORAL | Status: DC
  Administered 2021-07-27 – 2021-07-31 (×5): 81 mg via ORAL
  Filled 2021-07-26 (×5): qty 1

## 2021-07-26 MED ORDER — SODIUM CHLORIDE 0.9 % IV SOLN
500.0000 mg | INTRAVENOUS | Status: DC
  Administered 2021-07-26 – 2021-07-27 (×2): 500 mg via INTRAVENOUS
  Filled 2021-07-26 (×2): qty 5

## 2021-07-26 MED ORDER — METRONIDAZOLE 500 MG/100ML IV SOLN
500.0000 mg | Freq: Two times a day (BID) | INTRAVENOUS | Status: DC
  Administered 2021-07-26 (×2): 500 mg via INTRAVENOUS
  Filled 2021-07-26 (×2): qty 100

## 2021-07-26 NOTE — ED Notes (Signed)
ED Provider at bedside. 

## 2021-07-26 NOTE — Progress Notes (Signed)
Pharmacy Antibiotic Note  Evan Wright is a 88 y.o. male admitted on 07/25/2021 with  intra-abdominal infection .  Pharmacy has been consulted for Cefepime dosing.  WBC and SCr elevated  Plan: -Cefepime 2 gm IV Q 12 hours -Flagyl 500 mg q 12 hours  -Monitor CBC, renal fx, cultures and clinical progress   Height: 6' (182.9 cm) Weight: 70.7 kg (155 lb 13.8 oz) IBW/kg (Calculated) : 77.6  Temp (24hrs), Avg:97.5 F (36.4 C), Min:97.5 F (36.4 C), Max:97.5 F (36.4 C)  Recent Labs  Lab 07/26/21 0017  WBC 20.4*  CREATININE 2.00*    Estimated Creatinine Clearance: 22.6 mL/min (A) (by C-G formula based on SCr of 2 mg/dL (H)).    Allergies  Allergen Reactions   Penicillins Other (See Comments)    Unknown reaction Did it involve swelling of the face/tongue/throat, SOB, or low BP? Unknown Did it involve sudden or severe rash/hives, skin peeling, or any reaction on the inside of your mouth or nose? Unknown Did you need to seek medical attention at a hospital or doctor's office? Unknown When did it last happen?    1940   If all above answers are NO, may proceed with cephalosporin use.    Antimicrobials this admission: Cefepime 1/5 >>  Flagyl 1/5 >>   Dose adjustments this admission:  Microbiology results:   Thank you for allowing pharmacy to be a part of this patients care.  Albertina Parr, PharmD., BCPS, BCCCP Clinical Pharmacist Please refer to [facility] for unit-specific pharmacist

## 2021-07-26 NOTE — Progress Notes (Signed)
  [facility] will assume care on arrival to accepting facility. Until arrival, care as per EDP. However, [facility] available 24/7 for questions and assistance.   Nursing staff please page [facility] Admits and Consults ([phone]) as soon as the patient arrives to the hospital.  Kortni Hasten, DO [facility]  

## 2021-07-26 NOTE — H&P (Signed)
History and Physical    ROSENDO COUSER ZOX:[id number] DOB: 10-13-1924 DOA: 07/25/2021  PCP: [name], DO  Patient coming from: [facility]  I have personally briefly reviewed patient's old medical records in [facility]  Chief Complaint: Fall at home  HPI: CASPIAN Wright is a 88 y.o. male with medical history significant for CAD s/p DES, T2DM with peripheral neuropathy, CKD stage III, HTN, HLD, hypothyroidism, constipation, frequent falls who presented to the ED for evaluation after a fall at home.  Patient states that he fell at home night of 1/4 when trying to get into bed.  He says he did have his walker with him at the time.  He does not recall hitting his head.  He says usually family can help him up at this time he has not been able to walk.  He also reports cough productive of white sputum which has been ongoing for 2 weeks.  He denies any subjective fevers, chills, diaphoresis, chest pain, dyspnea.  He reports alternating constipation and diarrhea over the last year.  He has been having some suprapubic discomfort and decreased urine output recently.  [facility] ED Course:  Initial vitals showed BP 125/91, pulse 77, RR 16, temp 97.5 F, SPO2 96% on room air.  Labs showed WBC 20.4, hemoglobin 12.2, platelets 251,000, sodium 139, potassium 2.9, bicarb 16, BUN 30, creatinine 2.00, serum glucose 197, LFTs within normal limits.  Urinalysis showed negative nitrates, negative leukocytes, 0-5 RBC/hpf, 0-5 WBC/hpf, few bacteria microscopy.  SARS-CoV-2 and influenza PCR negative.  Bladder scan showed 667 mLs.  Foley catheter was placed.  CT head without contrast was negative for acute intracranial injury or calvarial fracture.  CT cervical spine without contrast was negative for acute fracture or listhesis of the cervical spine.  CT renal stone study showed multifocal consolidation within the right lung base and small associated right pleural effusion.  Large volume stool within the rectal  vault seen in keeping with probable fecal impaction with possible superimposed stercoral proctitis.  Mild left nonobstructing nephrolithiasis without hydronephrosis noted.  Complete decompression of the bladder with Foley catheter balloon seen in place.  Manual disimpaction was performed in the ED.  Patient was given 1 L normal saline, 40 mEq oral potassium, IV cefepime and Flagyl.  The hospitalist service was consulted to admit for further evaluation and management.  Review of Systems: All systems reviewed and are negative except as documented in history of present illness above.   Past Medical History:  Diagnosis Date   Abnormal EKG 04/02/2017   Adenomatous colon polyp    Angina pectoris ([place]) 04/2017   CAD (coronary artery disease)    a. Cath 04/29/17-> high grade pLAD with R to L collaterals, high grade 1st dig  2. Cath 05/01/17 s/p ostial to mLAD with DES x 2.    CAD (coronary artery disease), native coronary artery 04/29/2017   Cardiac murmur    Coronary artery disease    De Quervain's tenosynovitis 05/18/2012   Diverticulosis    DIZZINESS 10/15/2007   Qualifier: Diagnosis of  By: [name]     Essential hypertension 09/25/2017   Family history of malignant neoplasm of gastrointestinal tract 05/14/2011   Brother with colon cancer    GASTROENTERITIS 01/20/2008   Qualifier: Diagnosis of  By: [name], [name]     GERD (gastroesophageal reflux disease)    HIATAL HERNIA WITH REFLUX 02/11/2008   Qualifier: Diagnosis of  By: [name]  History of hiatal hernia    Hyperglycemia 09/25/2017   Hyperlipidemia    Hypothyroidism 09/25/2017   Idiopathic peripheral neuropathy 10/11/2016   Intermittent claudication ([place]) 06/02/2020   Ischemic cardiomyopathy 04/10/2017   Loss of height 06/11/2016   Mixed dyslipidemia 04/10/2017   NEVI, MULTIPLE 07/12/2010   Qualifier: Diagnosis of  By: [name]     Nonsustained ventricular tachycardia 04/24/2017   Osteoarthritis     OSTEOARTHRITIS 07/12/2010   Qualifier: Diagnosis of  By: [name]     Osteopenia 09/25/2017   Personal history of colonic polyps 11/17/2007   Qualifier: Diagnosis of  By: [name], [name]    Preventative health care 09/23/2018   Prostate cancer [facility])    PROSTATE CANCER, HX OF 10/15/2007   Qualifier: Diagnosis of  By: [name]     Proximal leg weakness 10/11/2016   Right arm weakness 09/28/2018   SDH (subdural hematoma) 09/28/2018   Seizure-like activity ([place]) 09/28/2018   Slow transit constipation 08/10/2019   Uncontrolled diabetes mellitus with hyperglycemia ([place]) 09/23/2018   Unspecified vitamin D deficiency 02/11/2008   Qualifier: Diagnosis of  By: [name]     Weakness of both lower extremities 06/11/2016    Past Surgical History:  Procedure Laterality Date   APPENDECTOMY  1948   COLONOSCOPY     CORONARY STENT INTERVENTION N/A 05/01/2017   Procedure: CORONARY STENT INTERVENTION;  Surgeon: [name], [name], MD;  Location: [place] [facility];  Service: Cardiovascular;  Laterality: N/A;   EYE SURGERY  2012   march and April  --Dr [name]   LEFT HEART CATH AND CORONARY ANGIOGRAPHY N/A 04/29/2017   Procedure: LEFT HEART CATH AND CORONARY ANGIOGRAPHY;  Surgeon: [name], MD;  Location: [place] [facility];  Service: Cardiovascular;  Laterality: N/A;   PROSTATECTOMY     TONSILLECTOMY      Social History:  reports that he has never smoked. He has never used smokeless tobacco. He reports that he does not drink alcohol and does not use drugs.  Allergies  Allergen Reactions   Penicillins Other (See Comments)    Unknown reaction Did it involve swelling of the face/tongue/throat, SOB, or low BP? Unknown Did it involve sudden or severe rash/hives, skin peeling, or any reaction on the inside of your mouth or nose? Unknown Did you need to seek medical attention at a hospital or doctor's office? Unknown When did it last happen?    1940   If all above answers are NO, may  proceed with cephalosporin use.    Family History  Problem Relation Age of Onset   Colon cancer Brother    Lung cancer Father    Prostate cancer Father    Colitis Mother      Prior to Admission medications   Medication Sig Start Date End Date Taking? Authorizing Provider  alendronate (FOSAMAX) 70 MG tablet Take 1 tablet (70 mg total) by mouth every 7 (seven) days. Take with a full glass of water on an empty stomach. 09/14/20   [name], DO  aspirin EC 81 MG tablet Take 1 tablet (81 mg total) by mouth daily at 12 noon. 10/06/18   Hongalgi, Lenis Dickinson, MD  atorvastatin (LIPITOR) 80 MG tablet TAKE ONE TABLET (80MG  TOTAL) BY MOUTH DAILY 04/02/21   Park Liter, MD  Calcium Carb-Cholecalciferol (CALCIUM+D3 PO) Take 1 tablet by mouth daily at 12 noon.    [provider]  carvedilol (COREG) 3.125 MG  tablet TAKE ONE TABLET (3.125MG  TOTAL) BY MOUTHTWICE DAILY WITH A MEAL 06/13/21   Carollee Herter, Kendrick Fries R, DO  ezetimibe (ZETIA) 10 MG tablet TAKE ONE TABLET (10MG  TOTAL) BY MOUTH DAILY 07/17/21   Revankar, Reita Cliche, MD  lansoprazole (PREVACID) 30 MG capsule Take 1 capsule (30 mg total) by mouth 2 (two) times daily before a meal. 06/02/20   Carollee Herter, Alferd Apa, DO  levothyroxine (SYNTHROID) 100 MCG tablet Take 1 tablet (100 mcg total) by mouth daily before breakfast. Pt due for office visit 09/05/20   Roma Schanz R, DO  MAGNESIUM PO Take 1 tablet by mouth daily at 12 noon.    [provider]  Multiple Vitamins-Minerals (PRESERVISION AREDS 2) CAPS Take 1 capsule by mouth daily at 12 noon.    [provider]  nitroGLYCERIN (NITROSTAT) 0.4 MG SL tablet Place 0.4 mg under the tongue every 5 (five) minutes as needed for chest pain.    [provider]  ranolazine (RANEXA) 500 MG 12 hr tablet TAKE ONE TABLET (500 MG TOTAL) BY MOUTH ONCE DAILY 07/17/21   Revankar, Reita Cliche, MD  Vitamin D, Ergocalciferol, (DRISDOL) 1.25 MG (50000 UNIT) CAPS capsule TAKE ONE  CAPSULE (50000 UNITS TOTAL) BY MOUTH EVERY 7 DAYS 04/02/21   [name], DO    Physical Exam: Vitals:   07/26/21 1500 07/26/21 1600 07/26/21 1645 07/26/21 1804  BP: (!) 122/57 124/60  (!) 155/70  Pulse: 67 63 66 66  Resp: 20 18 19 18   Temp:    97.7 F (36.5 C)  TempSrc:    Oral  SpO2: 100% 99% 99% 91%  Weight:    68.6 kg  Height:       Constitutional: Elderly man resting in bed with head elevated, NAD, calm, comfortable Eyes: PERRL, lids and conjunctivae normal ENMT: Mucous membranes are moist. Posterior pharynx clear of any exudate or lesions.Normal dentition.  Neck: normal, supple, no masses. Respiratory: Inspiratory crackles right lung base. Normal respiratory effort. No accessory muscle use.  Frequent cough productive of white sputum. Cardiovascular: Regular rate and rhythm, no murmurs / rubs / gallops. No extremity edema. 2+ pedal pulses. Abdomen: no tenderness, no masses palpated. No hepatosplenomegaly. Bowel sounds diminished.  Musculoskeletal: no clubbing / cyanosis. No joint deformity upper and lower extremities. No contractures. Normal muscle tone.  Skin: Stasis dermatitis bilateral lower extremities.  No induration Neurologic: CN 2-12 grossly intact. Sensation intact. Strength generally weak but largely 5/5 in all 4.  Psychiatric: Normal judgment and insight. Alert and oriented x 3. Normal mood.   Labs on Admission: I have personally reviewed following labs and imaging studies  CBC: Recent Labs  Lab 07/26/21 0017  WBC 20.4*  NEUTROABS 18.3*  HGB 12.2*  HCT 35.4*  MCV 91.0  PLT 778   Basic Metabolic Panel: Recent Labs  Lab 07/26/21 0017  NA 139  K 2.9*  CL 108  CO2 16*  GLUCOSE 197*  BUN 30*  CREATININE 2.00*  CALCIUM 8.7*   GFR: Estimated Creatinine Clearance: 21.9 mL/min (A) (by C-G formula based on SCr of 2 mg/dL (H)). Liver Function Tests: Recent Labs  Lab 07/26/21 0017  AST 19  ALT 12  ALKPHOS 55  BILITOT 1.2  PROT 6.4*   ALBUMIN 2.8*   No results for input(s): LIPASE, AMYLASE in the last 168 hours. No results for input(s): AMMONIA in the last 168 hours. Coagulation Profile: No results for input(s): INR, PROTIME in the last 168 hours. Cardiac Enzymes: No  results for input(s): CKTOTAL, CKMB, CKMBINDEX, TROPONINI in the last 168 hours. BNP (last 3 results) No results for input(s): PROBNP in the last 8760 hours. HbA1C: No results for input(s): HGBA1C in the last 72 hours. CBG: Recent Labs  Lab 07/25/21 2346  GLUCAP 174*   Lipid Profile: No results for input(s): CHOL, HDL, LDLCALC, TRIG, CHOLHDL, LDLDIRECT in the last 72 hours. Thyroid Function Tests: No results for input(s): TSH, T4TOTAL, FREET4, T3FREE, THYROIDAB in the last 72 hours. Anemia Panel: No results for input(s): VITAMINB12, FOLATE, FERRITIN, TIBC, IRON, RETICCTPCT in the last 72 hours. Urine analysis:    Component Value Date/Time   COLORURINE YELLOW 07/26/2021 0101   APPEARANCEUR CLEAR 07/26/2021 0101   LABSPEC 1.015 07/26/2021 0101   PHURINE 5.5 07/26/2021 0101   GLUCOSEU 100 (A) 07/26/2021 0101   HGBUR TRACE (A) 07/26/2021 0101   HGBUR negative 07/12/2010 0941   BILIRUBINUR NEGATIVE 07/26/2021 0101   BILIRUBINUR neg 06/11/2016 1149   KETONESUR NEGATIVE 07/26/2021 0101   PROTEINUR 30 (A) 07/26/2021 0101   UROBILINOGEN 0.2 06/11/2016 1149   UROBILINOGEN negative 07/12/2010 0941   NITRITE NEGATIVE 07/26/2021 0101   LEUKOCYTESUR NEGATIVE 07/26/2021 0101    Radiological Exams on Admission: CT Head Wo Contrast  Result Date: 07/26/2021 CLINICAL DATA:  Neck trauma (Age >= 65y); Head trauma, minor (Age >= 65y). Fall, now with gait abnormality and lower extremity weakness EXAM: CT HEAD WITHOUT CONTRAST CT CERVICAL SPINE WITHOUT CONTRAST TECHNIQUE: Multidetector CT imaging of the head and cervical spine was performed following the standard protocol without intravenous contrast. Multiplanar CT image reconstructions of the cervical spine  were also generated. COMPARISON:  None. FINDINGS: CT HEAD FINDINGS Brain: Mild parenchymal volume loss is commensurate with the patient's age. Mild periventricular white matter changes are present likely reflecting the sequela of small vessel ischemia. Hypoattenuation within the a left subinsular and periventricular white matter adjacent to the anterior horn of the left lateral ventricle is unchanged most in keeping with a remote white matter infarct. No evidence of acute intracranial hemorrhage or infarct. 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 No intra or extra-axial fluid collection. Ventricular size is borderline enlarged, likely the sequela of central atrophy. The cerebellum is unremarkable. Vascular: No hyperdense vessel or unexpected calcification. Skull: Normal. Negative for fracture or focal lesion. Sinuses/Orbits: Paranasal sinuses are clear. Ocular lenses have been removed. Orbits are otherwise unremarkable. Other: Mastoid air cells and middle ear cavities are clear. CT CERVICAL SPINE FINDINGS Alignment: Normal. Skull base and vertebrae: No acute fracture. No primary bone lesion or focal pathologic process. Soft tissues and spinal canal: No prevertebral fluid or swelling. No visible canal hematoma. Disc levels: There is diffuse intervertebral disc space narrowing and endplate remodeling throughout the cervical spine in keeping with changes of diffuse degenerative disc disease. Prevertebral soft tissues are not thickened. Spinal canal demonstrates mild stenosis secondary to a posterior disc osteophyte complex at C5-6 which mildly flattens the thecal sac. AP diameter of the spinal canal in this region is 7-8 mm. Similar changes are seen at C6-7. Multilevel uncovertebral and facet arthrosis results in multilevel mild-to-moderate neuroforaminal narrowing, most severe at C3-4 and C6-7 on the left and  C4-5 on the right. Upper chest: Unremarkable Other: None IMPRESSION: No acute intracranial injury.  No calvarial fracture. No acute fracture or listhesis of the cervical spine. Electronically Signed   By: Fidela Salisbury M.D.   On:  07/26/2021 01:49   CT Cervical Spine Wo Contrast  Result Date: 07/26/2021 CLINICAL DATA:  Neck trauma (Age >= 65y); Head trauma, minor (Age >= 65y). Fall, now with gait abnormality and lower extremity weakness EXAM: CT HEAD WITHOUT CONTRAST CT CERVICAL SPINE WITHOUT CONTRAST TECHNIQUE: Multidetector CT imaging of the head and cervical spine was performed following the standard protocol without intravenous contrast. Multiplanar CT image reconstructions of the cervical spine were also generated. COMPARISON:  None. FINDINGS: CT HEAD FINDINGS Brain: Mild parenchymal volume loss is commensurate with the patient's age. Mild periventricular white matter changes are present likely reflecting the sequela of small vessel ischemia. Hypoattenuation within the a left subinsular and periventricular white matter adjacent to the anterior horn of the left lateral ventricle is unchanged most in keeping with a remote white matter infarct. No evidence of acute intracranial hemorrhage or infarct. 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 No intra or extra-axial fluid collection. Ventricular size is borderline enlarged, likely the sequela of central atrophy. The cerebellum is unremarkable. Vascular: No hyperdense vessel or unexpected calcification. Skull: Normal. Negative for fracture or focal lesion. Sinuses/Orbits: Paranasal sinuses are clear. Ocular lenses have been removed. Orbits are otherwise unremarkable. Other: Mastoid air cells and middle ear cavities are clear. CT CERVICAL SPINE FINDINGS Alignment: Normal. Skull base and vertebrae: No acute fracture. No primary bone lesion or focal pathologic  process. Soft tissues and spinal canal: No prevertebral fluid or swelling. No visible canal hematoma. Disc levels: There is diffuse intervertebral disc space narrowing and endplate remodeling throughout the cervical spine in keeping with changes of diffuse degenerative disc disease. Prevertebral soft tissues are not thickened. Spinal canal demonstrates mild stenosis secondary to a posterior disc osteophyte complex at C5-6 which mildly flattens the thecal sac. AP diameter of the spinal canal in this region is 7-8 mm. Similar changes are seen at C6-7. Multilevel uncovertebral and facet arthrosis results in multilevel mild-to-moderate neuroforaminal narrowing, most severe at C3-4 and C6-7 on the left and C4-5 on the right. Upper chest: Unremarkable Other: None IMPRESSION: No acute intracranial injury.  No calvarial fracture. No acute fracture or listhesis of the cervical spine. Electronically Signed   By: Fidela Salisbury M.D.   On: 07/26/2021 01:49   CT Renal Stone Study  Result Date: 07/26/2021 CLINICAL DATA:  Urinary retention EXAM: CT ABDOMEN AND PELVIS WITHOUT CONTRAST TECHNIQUE: Multidetector CT imaging of the abdomen and pelvis was performed following the standard protocol without IV contrast. COMPARISON:  None. FINDINGS: Lower chest: There is multifocal consolidation of the right lung base which may relate to infection or aspiration in the acute setting. Small right pleural effusion is present. Extensive coronary artery calcification. Global cardiac size within normal limits. Small hiatal hernia. Hepatobiliary: No focal liver abnormality is seen. No gallstones, gallbladder wall thickening, or biliary dilatation. Pancreas: Unremarkable Spleen: Unremarkable Adrenals/Urinary Tract: The adrenal glands are unremarkable. The kidneys are normal in size and position. 5 mm nonobstructing calculus noted within the upper pole the left kidney. No ureteral calculi. No hydronephrosis. Foley catheter balloon is seen within a  decompressed bladder lumen. Stomach/Bowel: There is large volume stool seen within the rectal vault with circumferential rectal wall thickening and mild perirectal inflammatory stranding suggesting changes of fecal impaction and superimposed stercoral proctitis. The stomach, small bowel, and large bowel are otherwise unremarkable. Appendix absent. Vascular/Lymphatic: Aortic atherosclerosis. No enlarged abdominal or pelvic lymph nodes. Reproductive: Status post  prostatectomy. Other: No abdominal wall hernia. Musculoskeletal: No acute bone abnormality. Osseous structures are age-appropriate. IMPRESSION: Multifocal consolidation within the right lung base, infection versus aspiration. Small associated right pleural effusion. Large volume stool within the rectal vault in keeping with probable fecal impaction. 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 Mild left nonobstructing nephrolithiasis. No hydronephrosis. Complete decompression of the bladder with Foley catheter balloon in place. Aortic Atherosclerosis (ICD10-I70.0). Electronically Signed   By: Fidela Salisbury M.D.   On: 07/26/2021 01:55    EKG: Not performed.  Assessment/Plan Principal Problem:   Right lower lobe pneumonia Active Problems:   CAD (coronary artery disease), native coronary artery   Hypertension associated with diabetes ([place])   Hypothyroidism   Type 2 diabetes mellitus (H[facility])   Hyperlipidemia associated with type 2 diabetes mellitus (H[facility])   Fecal impaction (H[facility])   Acute kidney injury superimposed on CKD ([place])   Urinary retention   Frequent falls   Hypokalemia   DAMYEN KNOLL is a 88 y.o. male with medical history significant for CAD s/p DES, T2DM with peripheral neuropathy, CKD stage III, HTN, HLD, hypothyroidism, constipation, frequent falls who is admitted with right lower lobe pneumonia, urinary retention, frequent falls.  Right lower lobe pneumonia: Multifocal right lower lobe consolidation seen on CT suggestive of pneumonia  versus aspiration.  Patient has continued productive cough but saturating well on room air. -Narrow antibiotics to IV ceftriaxone and azithromycin -Continue supplemental oxygen as needed -Obtain sputum culture and strep pneumonia urinary antigen  Fecal impaction with stercoral proctitis: Manual disimpaction performed in the ED.  He has been having loose brown stools since then.  We will obtain KUB in a.m.  Urinary retention: Likely secondary to fecal impaction.  Foley catheter placed in the ED.  Acute kidney injury superimposed on CKD Stage 3: Unspecified whether stage IIIa or IIIb due to variable GFR on prior labs.  Likely secondary to urinary retention.  Mild left nonobstructing nephrolithiasis without hydronephrosis seen on CT renal stone study. -Started on IV NS@100  mL/hour overnight  Hypokalemia: Oral supplement given.  Repeat labs and replete further if needed.  Frequent falls: Secondary to peripheral neuropathy and deconditioning.  CT head and cervical spine negative for acute injury.  Request PT/OT eval.  Continue fall precautions.  CAD s/p DES: Stable, patient denies any chest pain.  Continue aspirin, statin, Coreg, Zetia, Ranexa.  Type 2 diabetes: Hemoglobin A1c 7.1 on 06/26/2021.  Recently taken off metformin due to GI issues.  Place on sensitive SSI.  Hypertension: Continue Coreg.  Hypothyroidism: Continue Synthroid. TSH 1.95 on 06/26/2021.  Hyperlipidemia: Continue atorvastatin and Zetia.  DVT prophylaxis: Lovenox Code Status: DNR, confirmed with patient on admission Family Communication: Discussed with patient, he has discussed with family Disposition Plan: From home, dispo pending clinical progress and PT/OT eval Consults called: None Level of care: Med-Surg Admission status:  Status is: Observation  The patient remains OBS appropriate and will d/c before 2 midnights.  Zada Finders MD [facility]  If 7PM-7AM, please contact  night-coverage [email or web address]  07/26/2021, 7:06 PM

## 2021-07-26 NOTE — ED Notes (Signed)
[facility] at bedside 

## 2021-07-26 NOTE — ED Notes (Signed)
Incontinence care provided.  Stat lock applied for foley catheter.  Catheter care provided.  Oatmeal and applesauce given for breakfast.  No distress noted.  Encouraged to call for assistance as needed.  Call bell within reach.

## 2021-07-26 NOTE — ED Notes (Signed)
Pts daughter Malachy Mood made aware that pt was transported to [facility] and taken to room 1609. All questions and concerns addressed at this time.

## 2021-07-26 NOTE — ED Provider Notes (Signed)
[facility] Provider Note  CSN: [id number] Arrival date & time: 07/25/21 2325  Chief Complaint(s) Urinary Retention  HPI Evan Wright is a 88 y.o. male with extensive past medical history listed below including hypertension, hyperlipidemia, diabetes with peripheral neuropathy resulting in previous falls and subdural hematomas here for urinary incontinence.  Patient reportedly had a fall yesterday attributed to his peripheral neuropathy.  Denied any head trauma or loss of consciousness.  No associated back pain.  Patient has had generalized fatigue today.  Also reported URI symptoms.  No fevers or chills.  No nausea or vomiting.  Patient has had multiple loose bowel movements the last several days.  Patient has had previous falls in the past and family has been able to get him back up and walking however today patient was not able to walk due to generalized fatigue prompting the call to EMS.  HPI  Past Medical History Past Medical History:  Diagnosis Date   Abnormal EKG 04/02/2017   Adenomatous colon polyp    Angina pectoris ([place]) 04/2017   CAD (coronary artery disease)    a. Cath 04/29/17-> high grade pLAD with R to L collaterals, high grade 1st dig  2. Cath 05/01/17 s/p ostial to mLAD with DES x 2.    CAD (coronary artery disease), native coronary artery 04/29/2017   Cardiac murmur    Coronary artery disease    De Quervain's tenosynovitis 05/18/2012   Diverticulosis    DIZZINESS 10/15/2007   Qualifier: Diagnosis of  By: [name], [place] hypertension 09/25/2017   Family history of malignant neoplasm of gastrointestinal tract 05/14/2011   Brother with colon cancer    GASTROENTERITIS 01/20/2008   Qualifier: Diagnosis of  By: [name], [name]     GERD (gastroesophageal reflux disease)    HIATAL HERNIA WITH REFLUX 02/11/2008   Qualifier: Diagnosis of  By: [name]     History of hiatal hernia    Hyperglycemia 09/25/2017   Hyperlipidemia     Hypothyroidism 09/25/2017   Idiopathic peripheral neuropathy 10/11/2016   Intermittent claudication ([place]) 06/02/2020   Ischemic cardiomyopathy 04/10/2017   Loss of height 06/11/2016   Mixed dyslipidemia 04/10/2017   NEVI, MULTIPLE 07/12/2010   Qualifier: Diagnosis of  By: [name]     Nonsustained ventricular tachycardia 04/24/2017   Osteoarthritis    OSTEOARTHRITIS 07/12/2010   Qualifier: Diagnosis of  By: [name]     Osteopenia 09/25/2017   Personal history of colonic polyps 11/17/2007   Qualifier: Diagnosis of  By: [name], [name]    Preventative health care 09/23/2018   Prostate cancer [facility])    PROSTATE CANCER, HX OF 10/15/2007   Qualifier: Diagnosis of  By: [name]     Proximal leg weakness 10/11/2016   Right arm weakness 09/28/2018   SDH (subdural hematoma) 09/28/2018   Seizure-like activity ([place]) 09/28/2018   Slow transit constipation 08/10/2019   Uncontrolled diabetes mellitus with hyperglycemia ([place]) 09/23/2018   Unspecified vitamin D deficiency 02/11/2008   Qualifier: Diagnosis of  By: [name]     Weakness of both lower extremities 06/11/2016   Patient Active Problem List   Diagnosis Date Noted   Fecal impaction ([place]) 07/26/2021   Traumatic subdural hemorrhage with loss of consciousness of unspecified duration, initial encounter ([place]) 06/26/2021   Diarrhea 06/26/2021   Moderate aortic regurgitation 03/13/2021   Ascending aortic aneurysm 03/13/2021   Pedal edema 03/13/2021  Adenomatous colon polyp    CAD (coronary artery disease)    Cardiac murmur    Coronary artery disease    Diverticulosis    GERD (gastroesophageal reflux disease)    History of hiatal hernia    Hyperlipidemia    Prostate cancer ([place])    Osteoarthritis    Intermittent claudication ([place]) 06/02/2020   Slow transit constipation 08/10/2019   SDH (subdural hematoma) 09/28/2018   Seizure-like activity ([place]) 09/28/2018   Right arm weakness 09/28/2018   Preventative health  care 09/23/2018   Uncontrolled diabetes mellitus with hyperglycemia ([place]) 09/23/2018   Osteopenia 09/25/2017   Essential hypertension 09/25/2017   Hyperglycemia 09/25/2017   Hypothyroidism 09/25/2017   CAD (coronary artery disease), native coronary artery 04/29/2017   Nonsustained ventricular tachycardia 04/24/2017   Angina pectoris ([place]) 04/2017   Ischemic cardiomyopathy 04/10/2017   Mixed dyslipidemia 04/10/2017   Abnormal EKG 04/02/2017   Idiopathic peripheral neuropathy 10/11/2016   Proximal leg weakness 10/11/2016   Weakness of both lower extremities 06/11/2016   Loss of height 06/11/2016   De Quervain's tenosynovitis 05/18/2012   Family history of malignant neoplasm of gastrointestinal tract 05/14/2011   NEVI, MULTIPLE 07/12/2010   OSTEOARTHRITIS 07/12/2010   UNSPECIFIED VITAMIN D DEFICIENCY 02/11/2008   HIATAL HERNIA WITH REFLUX 02/11/2008   GASTROENTERITIS 01/20/2008   PERSONAL HISTORY OF COLONIC POLYPS 11/17/2007   DIZZINESS 10/15/2007   PROSTATE CANCER, HX OF 10/15/2007   Home Medication(s) Prior to Admission medications   Medication Sig Start Date End Date Taking? Authorizing Provider  alendronate (FOSAMAX) 70 MG tablet Take 1 tablet (70 mg total) by mouth every 7 (seven) days. Take with a full glass of water on an empty stomach. 09/14/20   [name], DO  aspirin EC 81 MG tablet Take 1 tablet (81 mg total) by mouth daily at 12 noon. 10/06/18   Hongalgi, Lenis Dickinson, MD  atorvastatin (LIPITOR) 80 MG tablet TAKE ONE TABLET (80MG  TOTAL) BY MOUTH DAILY 04/02/21   Park Liter, MD  Calcium Carb-Cholecalciferol (CALCIUM+D3 PO) Take 1 tablet by mouth daily at 12 noon.    [provider]  carvedilol (COREG) 3.125 MG tablet TAKE ONE TABLET (3.125MG  TOTAL) BY MOUTHTWICE DAILY WITH A MEAL 06/13/21   Carollee Herter, Kendrick Fries R, DO  ezetimibe (ZETIA) 10 MG tablet TAKE ONE TABLET (10MG  TOTAL) BY MOUTH DAILY 07/17/21   Revankar, Reita Cliche, MD  lansoprazole (PREVACID) 30  MG capsule Take 1 capsule (30 mg total) by mouth 2 (two) times daily before a meal. 06/02/20   Carollee Herter, Alferd Apa, DO  levothyroxine (SYNTHROID) 100 MCG tablet Take 1 tablet (100 mcg total) by mouth daily before breakfast. Pt due for office visit 09/05/20   Roma Schanz R, DO  MAGNESIUM PO Take 1 tablet by mouth daily at 12 noon.    [provider]  Multiple Vitamins-Minerals (PRESERVISION AREDS 2) CAPS Take 1 capsule by mouth daily at 12 noon.    [provider]  nitroGLYCERIN (NITROSTAT) 0.4 MG SL tablet Place 0.4 mg under the tongue every 5 (five) minutes as needed for chest pain.    [provider]  ranolazine (RANEXA) 500 MG 12 hr tablet TAKE ONE TABLET (500 MG TOTAL) BY MOUTH ONCE DAILY 07/17/21   Revankar, Reita Cliche, MD  Vitamin D, Ergocalciferol, (DRISDOL) 1.25 MG (50000 UNIT) CAPS capsule TAKE ONE CAPSULE (50000 UNITS TOTAL) BY MOUTH EVERY 7 DAYS 04/02/21   [name], DO  Allergies Penicillins  Review of Systems Review of Systems As noted in HPI  Physical Exam Vital Signs  I have reviewed the triage vital signs BP (!) 116/58    Pulse 70    Temp (!) 97.5 F (36.4 C) (Oral)    Resp (!) 22    Ht 6' (1.829 m)    Wt 70.7 kg    SpO2 97%    BMI 21.14 kg/m   Physical Exam Vitals reviewed.  Constitutional:      General: He is not in acute distress.    Appearance: He is well-developed. He is not diaphoretic.  HENT:     Head: Normocephalic and atraumatic.     Right Ear: External ear normal.     Left Ear: External ear normal.     Nose: Nose normal.     Mouth/Throat:     Mouth: Mucous membranes are moist.  Eyes:     General: No scleral icterus.    Conjunctiva/sclera: Conjunctivae normal.  Neck:     Trachea: Phonation normal.  Cardiovascular:     Rate and Rhythm: Normal rate and regular rhythm.  Pulmonary:      Effort: Pulmonary effort is normal. No respiratory distress.     Breath sounds: No stridor.  Abdominal:     General: There is distension.     Tenderness: There is no abdominal tenderness.  Musculoskeletal:        General: Normal range of motion.     Cervical back: Normal range of motion.  Neurological:     Mental Status: He is alert and oriented to person, place, and time.     Comments: Spine Exam: Strength: 3/5 throughout LE bilaterally  Sensation: Intact to light touch in proximal and distal LE bilaterally. No saddle anesthesia. Intact rectal tone. Reflexes: 1+ quadriceps and achilles reflexes   Psychiatric:        Behavior: Behavior normal.    ED Results and Treatments Labs (all labs ordered are listed, but only abnormal results are displayed) Labs Reviewed  CBC WITH DIFFERENTIAL/PLATELET - Abnormal; Notable for the following components:      Result Value   WBC 20.4 (*)    RBC 3.89 (*)    Hemoglobin 12.2 (*)    HCT 35.4 (*)    Neutro Abs 18.3 (*)    Abs Immature Granulocytes 0.10 (*)    All other components within normal limits  COMPREHENSIVE METABOLIC PANEL - Abnormal; Notable for the following components:   Potassium 2.9 (*)    CO2 16 (*)    Glucose, Bld 197 (*)    BUN 30 (*)    Creatinine, Ser 2.00 (*)    Calcium 8.7 (*)    Total Protein 6.4 (*)    Albumin 2.8 (*)    GFR, Estimated 30 (*)    All other components within normal limits  URINALYSIS, ROUTINE W REFLEX MICROSCOPIC - Abnormal; Notable for the following components:   Glucose, UA 100 (*)    Hgb urine dipstick TRACE (*)    Protein, ur 30 (*)    All other components within normal limits  URINALYSIS, MICROSCOPIC (REFLEX) - Abnormal; Notable for the following components:   Bacteria, UA FEW (*)    All other components within normal limits  CBG MONITORING, ED - Abnormal; Notable for the following components:   Glucose-Capillary 174 (*)    All other components within normal limits  RESP PANEL BY RT-PCR  (FLU A&B, COVID) [id number]  EKG  EKG Interpretation  Date/Time:    Ventricular Rate:    PR Interval:    QRS Duration:   QT Interval:    QTC Calculation:   R Axis:     Text Interpretation:         Radiology CT Head Wo Contrast  Result Date: 07/26/2021 CLINICAL DATA:  Neck trauma (Age >= 65y); Head trauma, minor (Age >= 65y). Fall, now with gait abnormality and lower extremity weakness EXAM: CT HEAD WITHOUT CONTRAST CT CERVICAL SPINE WITHOUT CONTRAST TECHNIQUE: Multidetector CT imaging of the head and cervical spine was performed following the standard protocol without intravenous contrast. Multiplanar CT image reconstructions of the cervical spine were also generated. COMPARISON:  None. FINDINGS: CT HEAD FINDINGS Brain: Mild parenchymal volume loss is commensurate with the patient's age. Mild periventricular white matter changes are present likely reflecting the sequela of small vessel ischemia. Hypoattenuation within the a left subinsular and periventricular white matter adjacent to the anterior horn of the left lateral ventricle is unchanged most in keeping with a remote white matter infarct. No evidence of acute intracranial hemorrhage or infarct. 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 No intra or extra-axial fluid collection. Ventricular size is borderline enlarged, likely the sequela of central atrophy. The cerebellum is unremarkable. Vascular: No hyperdense vessel or unexpected calcification. Skull: Normal. Negative for fracture or focal lesion. Sinuses/Orbits: Paranasal sinuses are clear. Ocular lenses have been removed. Orbits are otherwise unremarkable. Other: Mastoid air cells and middle ear cavities are clear. CT CERVICAL SPINE FINDINGS Alignment: Normal. Skull base  and vertebrae: No acute fracture. No primary bone lesion or focal pathologic process. Soft tissues and spinal canal: No prevertebral fluid or swelling. No visible canal hematoma. Disc levels: There is diffuse intervertebral disc space narrowing and endplate remodeling throughout the cervical spine in keeping with changes of diffuse degenerative disc disease. Prevertebral soft tissues are not thickened. Spinal canal demonstrates mild stenosis secondary to a posterior disc osteophyte complex at C5-6 which mildly flattens the thecal sac. AP diameter of the spinal canal in this region is 7-8 mm. Similar changes are seen at C6-7. Multilevel uncovertebral and facet arthrosis results in multilevel mild-to-moderate neuroforaminal narrowing, most severe at C3-4 and C6-7 on the left and C4-5 on the right. Upper chest: Unremarkable Other: None IMPRESSION: No acute intracranial injury.  No calvarial fracture. No acute fracture or listhesis of the cervical spine. Electronically Signed   By: Fidela Salisbury M.D.   On: 07/26/2021 01:49   CT Cervical Spine Wo Contrast  Result Date: 07/26/2021 CLINICAL DATA:  Neck trauma (Age >= 65y); Head trauma, minor (Age >= 65y). Fall, now with gait abnormality and lower extremity weakness EXAM: CT HEAD WITHOUT CONTRAST CT CERVICAL SPINE WITHOUT CONTRAST TECHNIQUE: Multidetector CT imaging of the head and cervical spine was performed following the standard protocol without intravenous contrast. Multiplanar CT image reconstructions of the cervical spine were also generated. COMPARISON:  None. FINDINGS: CT HEAD FINDINGS Brain: Mild parenchymal volume loss is commensurate with the patient's age. Mild periventricular white matter changes are present likely reflecting the sequela of small vessel ischemia. Hypoattenuation within the a left subinsular and periventricular white matter adjacent to the anterior horn of the left lateral ventricle is unchanged most in keeping with a remote white matter  infarct. No evidence of acute intracranial hemorrhage or infarct. 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 No intra or extra-axial fluid collection. Ventricular size is borderline enlarged, likely the sequela of central atrophy. The cerebellum is unremarkable. Vascular: No hyperdense vessel or unexpected calcification. Skull: Normal. Negative for fracture or focal lesion. Sinuses/Orbits: Paranasal sinuses are clear. Ocular lenses have been removed. Orbits are otherwise unremarkable. Other: Mastoid air cells and middle ear cavities are clear. CT CERVICAL SPINE FINDINGS Alignment: Normal. Skull base and vertebrae: No acute fracture. No primary bone lesion or focal pathologic process. Soft tissues and spinal canal: No prevertebral fluid or swelling. No visible canal hematoma. Disc levels: There is diffuse intervertebral disc space narrowing and endplate remodeling throughout the cervical spine in keeping with changes of diffuse degenerative disc disease. Prevertebral soft tissues are not thickened. Spinal canal demonstrates mild stenosis secondary to a posterior disc osteophyte complex at C5-6 which mildly flattens the thecal sac. AP diameter of the spinal canal in this region is 7-8 mm. Similar changes are seen at C6-7. Multilevel uncovertebral and facet arthrosis results in multilevel mild-to-moderate neuroforaminal narrowing, most severe at C3-4 and C6-7 on the left and C4-5 on the right. Upper chest: Unremarkable Other: None IMPRESSION: No acute intracranial injury.  No calvarial fracture. No acute fracture or listhesis of the cervical spine. Electronically Signed   By: Fidela Salisbury M.D.   On: 07/26/2021 01:49   CT Renal Stone Study  Result Date: 07/26/2021 CLINICAL DATA:  Urinary retention EXAM: CT ABDOMEN AND PELVIS WITHOUT CONTRAST TECHNIQUE: Multidetector CT imaging of the abdomen and pelvis was  performed following the standard protocol without IV contrast. COMPARISON:  None. FINDINGS: Lower chest: There is multifocal consolidation of the right lung base which may relate to infection or aspiration in the acute setting. Small right pleural effusion is present. Extensive coronary artery calcification. Global cardiac size within normal limits. Small hiatal hernia. Hepatobiliary: No focal liver abnormality is seen. No gallstones, gallbladder wall thickening, or biliary dilatation. Pancreas: Unremarkable Spleen: Unremarkable Adrenals/Urinary Tract: The adrenal glands are unremarkable. The kidneys are normal in size and position. 5 mm nonobstructing calculus noted within the upper pole the left kidney. No ureteral calculi. No hydronephrosis. Foley catheter balloon is seen within a decompressed bladder lumen. Stomach/Bowel: There is large volume stool seen within the rectal vault with circumferential rectal wall thickening and mild perirectal inflammatory stranding suggesting changes of fecal impaction and superimposed stercoral proctitis. The stomach, small bowel, and large bowel are otherwise unremarkable. Appendix absent. Vascular/Lymphatic: Aortic atherosclerosis. No enlarged abdominal or pelvic lymph nodes. Reproductive: Status post prostatectomy. Other: No abdominal wall hernia. Musculoskeletal: No acute bone abnormality. Osseous structures are age-appropriate. IMPRESSION: Multifocal consolidation within the right lung base, infection versus aspiration. Small associated right pleural effusion. Large volume stool within the rectal vault in keeping with probable fecal impaction. 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 Mild left nonobstructing nephrolithiasis. No hydronephrosis. Complete decompression of the bladder with Foley catheter balloon in place. Aortic Atherosclerosis (ICD10-I70.0). Electronically Signed   By: Fidela Salisbury M.D.   On: 07/26/2021 01:55    Pertinent labs & imaging results that were  available during my care of the patient were reviewed by me and considered in my medical decision making (see MDM for details).  Medications Ordered in ED Medications  metroNIDAZOLE (FLAGYL) IVPB 500 mg (500 mg Intravenous New Bag/Given 07/26/21 0341)  ceFEPIme (MAXIPIME) 2 g in sodium chloride 0.9 % 100 mL IVPB (0 g Intravenous Stopped 07/26/21 0326)  sodium chloride 0.9 % bolus 1,000  mL ( Intravenous Stopped 07/26/21 0336)                                                                                                                                     Procedures Fecal disimpaction  Date/Time: 07/26/2021 5:23 AM Performed by: Fatima Blank, MD Authorized by: Fatima Blank, MD  Consent: Verbal consent obtained. Consent given by: patient Imaging studies: imaging studies available Time out: Immediately prior to procedure a "time out" was called to verify the correct patient, procedure, equipment, support staff and site/side marked as required. Local anesthesia used: no  Anesthesia: Local anesthesia used: no  Sedation: Patient sedated: no  Patient tolerance: patient tolerated the procedure well with no immediate complications   .1-3 Lead EKG Interpretation Performed by: Fatima Blank, MD Authorized by: Fatima Blank, MD     Interpretation: normal     ECG rate:  76   ECG rate assessment: normal     Rhythm: sinus rhythm     Ectopy: none     Conduction: normal    (including critical care time)  Medical Decision Making / ED Course     Fall Related to peripheral neuropathy and generalized fatigue. No signs of trauma noted on exam. Given his age and prior history of subdural hematomas, CT head and cervical spine ordered. CTs individually interpreted by me negative for ICH or obvious fractures or injuries.  This was confirmed by radiology. CT of the abdomen also obtain to assess for any lumbar fractures related to the fall given his urinary  retention. No evidence of acute fracture noted on CT. CT did reveal evidence of fecal impaction with likely proctitis.  Urinary retention Distended bladder noted on exam. Bladder scan greater than 600 cc. Foley inserted. UA negative for infection. Retention likely secondary to fecal impaction.  Less likely cauda equina.  Fecal impaction with proctitis Noted on CT of the abdomen incidentally.  This is likely the cause of patient's urinary retention. No evidence of perforation. Labs independently evaluated by myself: CBC with leukocytosis.  No significant anemia. Metabolic panel with mild hypokalemia.  Stable renal function when compared to prior labs from 1 month ago on record review. Manual disimpaction performed at bedside. Patient started on empiric antibiotics. Will require admission to the hospital. URI COVID/Flu negative   Final Clinical Impression(s) / ED Diagnoses Final diagnoses:  Fecal impaction ([place])  Stercoral colitis  Urine retention           This chart was dictated using voice recognition software.  Despite best efforts to proofread,  errors can occur which can change the documentation meaning.    Fatima Blank, MD 07/26/21 [phone]

## 2021-07-26 NOTE — ED Notes (Signed)
Per pt family, pt fell yesterday and had to call fire for [place] to get pt up. Pt has fallen in the past but is able to get up with family and back to walking the next day. Pt has not been able to walk today. Pt has had URI as well.

## 2021-07-26 NOTE — ED Notes (Signed)
Pt incontinent of stool. Peri care performed and brief changed. Pt moved to carelink stretcher and repositioned.

## 2021-07-26 NOTE — ED Notes (Signed)
Patient transported to CT 

## 2021-07-26 NOTE — ED Notes (Signed)
Continues to have loose brown stools.  Incontinence care provided.  Repositioned for comfort.  Encouraged to call for assistance as needed.

## 2021-07-27 ENCOUNTER — Inpatient Hospital Stay (HOSPITAL_COMMUNITY): Payer: Medicare Other

## 2021-07-27 ENCOUNTER — Observation Stay (HOSPITAL_COMMUNITY): Payer: Medicare Other

## 2021-07-27 DIAGNOSIS — J069 Acute upper respiratory infection, unspecified: Secondary | ICD-10-CM | POA: Diagnosis present

## 2021-07-27 DIAGNOSIS — E1169 Type 2 diabetes mellitus with other specified complication: Secondary | ICD-10-CM | POA: Diagnosis present

## 2021-07-27 DIAGNOSIS — Z20822 Contact with and (suspected) exposure to covid-19: Secondary | ICD-10-CM | POA: Diagnosis present

## 2021-07-27 DIAGNOSIS — R339 Retention of urine, unspecified: Secondary | ICD-10-CM | POA: Diagnosis present

## 2021-07-27 DIAGNOSIS — K219 Gastro-esophageal reflux disease without esophagitis: Secondary | ICD-10-CM | POA: Diagnosis present

## 2021-07-27 DIAGNOSIS — N179 Acute kidney failure, unspecified: Secondary | ICD-10-CM | POA: Diagnosis present

## 2021-07-27 DIAGNOSIS — I2510 Atherosclerotic heart disease of native coronary artery without angina pectoris: Secondary | ICD-10-CM | POA: Diagnosis present

## 2021-07-27 DIAGNOSIS — R32 Unspecified urinary incontinence: Secondary | ICD-10-CM | POA: Diagnosis present

## 2021-07-27 DIAGNOSIS — K5900 Constipation, unspecified: Secondary | ICD-10-CM | POA: Diagnosis not present

## 2021-07-27 DIAGNOSIS — E039 Hypothyroidism, unspecified: Secondary | ICD-10-CM | POA: Diagnosis present

## 2021-07-27 DIAGNOSIS — E872 Acidosis, unspecified: Secondary | ICD-10-CM | POA: Diagnosis present

## 2021-07-27 DIAGNOSIS — E1140 Type 2 diabetes mellitus with diabetic neuropathy, unspecified: Secondary | ICD-10-CM | POA: Diagnosis present

## 2021-07-27 DIAGNOSIS — E1151 Type 2 diabetes mellitus with diabetic peripheral angiopathy without gangrene: Secondary | ICD-10-CM | POA: Diagnosis present

## 2021-07-27 DIAGNOSIS — Z86010 Personal history of colonic polyps: Secondary | ICD-10-CM | POA: Diagnosis not present

## 2021-07-27 DIAGNOSIS — J690 Pneumonitis due to inhalation of food and vomit: Secondary | ICD-10-CM | POA: Diagnosis present

## 2021-07-27 DIAGNOSIS — E1142 Type 2 diabetes mellitus with diabetic polyneuropathy: Secondary | ICD-10-CM | POA: Diagnosis present

## 2021-07-27 DIAGNOSIS — N1832 Chronic kidney disease, stage 3b: Secondary | ICD-10-CM | POA: Diagnosis present

## 2021-07-27 DIAGNOSIS — I351 Nonrheumatic aortic (valve) insufficiency: Secondary | ICD-10-CM | POA: Diagnosis present

## 2021-07-27 DIAGNOSIS — J189 Pneumonia, unspecified organism: Secondary | ICD-10-CM

## 2021-07-27 DIAGNOSIS — I152 Hypertension secondary to endocrine disorders: Secondary | ICD-10-CM | POA: Diagnosis present

## 2021-07-27 DIAGNOSIS — E782 Mixed hyperlipidemia: Secondary | ICD-10-CM | POA: Diagnosis present

## 2021-07-27 DIAGNOSIS — E1122 Type 2 diabetes mellitus with diabetic chronic kidney disease: Secondary | ICD-10-CM | POA: Diagnosis present

## 2021-07-27 DIAGNOSIS — Z955 Presence of coronary angioplasty implant and graft: Secondary | ICD-10-CM | POA: Diagnosis not present

## 2021-07-27 DIAGNOSIS — F05 Delirium due to known physiological condition: Secondary | ICD-10-CM | POA: Diagnosis not present

## 2021-07-27 DIAGNOSIS — Z66 Do not resuscitate: Secondary | ICD-10-CM | POA: Diagnosis present

## 2021-07-27 DIAGNOSIS — Z9181 History of falling: Secondary | ICD-10-CM | POA: Diagnosis not present

## 2021-07-27 DIAGNOSIS — I7121 Aneurysm of the ascending aorta, without rupture: Secondary | ICD-10-CM | POA: Diagnosis present

## 2021-07-27 HISTORY — DX: Pneumonia, unspecified organism: J18.9

## 2021-07-27 LAB — CBC
HCT: 33.7 % — ABNORMAL LOW (ref 39.0–52.0)
Hemoglobin: 11 g/dL — ABNORMAL LOW (ref 13.0–17.0)
MCH: 31 pg (ref 26.0–34.0)
MCHC: 32.6 g/dL (ref 30.0–36.0)
MCV: 94.9 fL (ref 80.0–100.0)
Platelets: 206 10*3/uL (ref 150–400)
RBC: 3.55 MIL/uL — ABNORMAL LOW (ref 4.22–5.81)
RDW: 12.8 % (ref 11.5–15.5)
WBC: 13.1 10*3/uL — ABNORMAL HIGH (ref 4.0–10.5)
nRBC: 0 % (ref 0.0–0.2)

## 2021-07-27 LAB — BASIC METABOLIC PANEL
Anion gap: 10 (ref 5–15)
BUN: 35 mg/dL — ABNORMAL HIGH (ref 8–23)
CO2: 18 mmol/L — ABNORMAL LOW (ref 22–32)
Calcium: 8.1 mg/dL — ABNORMAL LOW (ref 8.9–10.3)
Chloride: 116 mmol/L — ABNORMAL HIGH (ref 98–111)
Creatinine, Ser: 1.74 mg/dL — ABNORMAL HIGH (ref 0.61–1.24)
GFR, Estimated: 36 mL/min — ABNORMAL LOW (ref >60)
Glucose, Bld: 143 mg/dL — ABNORMAL HIGH (ref 70–99)
Potassium: 3.5 mmol/L (ref 3.5–5.1)
Sodium: 144 mmol/L (ref 135–145)

## 2021-07-27 LAB — GLUCOSE, CAPILLARY
Glucose-Capillary: 132 mg/dL — ABNORMAL HIGH (ref 70–99)
Glucose-Capillary: 137 mg/dL — ABNORMAL HIGH (ref 70–99)
Glucose-Capillary: 116 mg/dL — ABNORMAL HIGH (ref 70–99)
Glucose-Capillary: 214 mg/dL — ABNORMAL HIGH (ref 70–99)

## 2021-07-27 LAB — PROCALCITONIN: Procalcitonin: 0.93 ng/mL

## 2021-07-27 LAB — LACTIC ACID, PLASMA
Lactic Acid, Venous: 2.2 mmol/L (ref 0.5–1.9)
Lactic Acid, Venous: 1.9 mmol/L (ref 0.5–1.9)

## 2021-07-27 LAB — STREP PNEUMONIAE URINARY ANTIGEN: Strep Pneumo Urinary Antigen: NEGATIVE

## 2021-07-27 LAB — MAGNESIUM: Magnesium: 1.6 mg/dL — ABNORMAL LOW (ref 1.7–2.4)

## 2021-07-27 MED ORDER — POLYETHYLENE GLYCOL 3350 17 G PO PACK
17.0000 g | PACK | Freq: Every day | ORAL | Status: DC
  Administered 2021-07-28 – 2021-07-30 (×3): 17 g via ORAL
  Filled 2021-07-27 (×5): qty 1

## 2021-07-27 MED ORDER — POTASSIUM CHLORIDE IN NACL 40-0.9 MEQ/L-% IV SOLN
INTRAVENOUS | Status: DC
  Filled 2021-07-27 (×3): qty 1000

## 2021-07-27 MED ORDER — SENNOSIDES-DOCUSATE SODIUM 8.6-50 MG PO TABS
1.0000 | ORAL_TABLET | Freq: Every day | ORAL | Status: DC
  Administered 2021-07-28: 1 via ORAL
  Filled 2021-07-27: qty 1

## 2021-07-27 NOTE — Progress Notes (Signed)
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 showed mainly fecal impaction and distended bladder Patient apparently disimpacted in the ED Foley placed because of retention On CT however there was also findings suggestive of aspiration-patient was started on broad-spectrum antibiotics ceftriaxone and azithromycin KUB was obtained with regards to fecal impaction For AKI patient was started on fluids Potassium was repleted  Hospital-Problem based course  ?  Pneumonia Found on CT scan Repeat two-view x-ray today, continue ceftriaxone/azithromycin but narrow based on lactic acid procalcitonin and x-ray findings He informs me he coughs at times when he eats so we will ask speech therapy to see him Multiple frequent falls Patient lives with wife at home--therapy to evaluate and make recommendations- imaging is negative for any fracture Will obtain set of orthostatics given he is on goal-directed therapy for heart disease Keep on telemetry to ensure no arrhythmia Fecal impaction urinary retention Both likely related--he was manually disimpacted in the ED His KUB still shows persisting stool Voiding trial for urine today Start bowel regimen with MiraLAX and senna Hypokalemia on admission 2.9 Mild metabolic acidosis AKI's post on CKD 3B of 30/2.0 Continue IV fluids cautiously at 75 cc an hour with potassium  menance Reassess labs a.m. As potassium is better would not need supplementation with magnesium Adjust fluids as per labs in a.m. CAD status post PCI 2019, ischemic cardiomyopathy Continue Coreg 3.125 cautiously, continue Ranexa 500 daily, continue aspirin 81 daily DM TY 2 Recent A1c 7.1 Taken off metformin recently 2/2 GI issues Will likely require start low dose alternate prior to d/c or in OP  DVT prophylaxis: Lovenox Code Status: Full Family Communication: called and updated wife Dowell Hoon [phone] Disposition:  Status is: Observation  The patient will require care spanning > 2 midnights and should be moved to inpatient because: needs work up      Consultants:    Procedures:   Antimicrobials:     Subjective: Awake coherent slow speeach but coherent Wife tells me has been bed-ridden x 2 weeks secondary to knee and leg pains Has not been dizzy   Objective: Vitals:   07/26/21 2156 07/27/21 0205 07/27/21 0556 07/27/21 0927  BP: (!) 149/63 134/74 120/63 (!) 157/67  Pulse: 65 (!) 59 60 67  Resp: 18 18 16    Temp: 98 F (36.7 C) (!) 97.5 F (36.4 C) 98.1 F (36.7 C)   TempSrc: Oral Oral Oral   SpO2: 100% 97% 100%   Weight:      Height:        Intake/Output Summary (Last 24 hours) at 07/27/2021 1047 Last data filed at 07/27/2021 0646 Gross per 24 hour  Intake 1284.19 ml  Output 1751 ml  Net -466.81 ml   Filed Weights   07/25/21 2330 07/26/21 1804  Weight: 70.7 kg 68.6 kg    Examination:  Eomi ncat  Cta b no added sound Abd soft nt nd  Able to slr bilaterally but weak Sensory intact no deficit  Psych  stable and euthymic  Data Reviewed: personally reviewed   CBC    Component Value Date/Time   WBC 13.1 (H) 07/27/2021 0535   RBC 3.55 (L) 07/27/2021 0535   HGB 11.0 (L) 07/27/2021 0535   HGB 13.4 03/13/2021 1347   HCT 33.7 (L) 07/27/2021 0535   HCT 41.9 03/13/2021 1347   PLT 206 07/27/2021 0535   PLT 246 03/13/2021 1347   MCV 94.9  07/27/2021 0535   MCV 94 03/13/2021 1347   MCH 31.0 07/27/2021 0535   MCHC 32.6 07/27/2021 0535   RDW 12.8 07/27/2021 0535   RDW 12.1 03/13/2021 1347   LYMPHSABS 0.8 07/26/2021 0017   LYMPHSABS 1.0 03/13/2021 1347   MONOABS 1.0 07/26/2021 0017   EOSABS 0.2 07/26/2021 0017   EOSABS 0.2 03/13/2021 1347   BASOSABS 0.0 07/26/2021 0017   BASOSABS 0.0 03/13/2021 1347   CMP Latest Ref Rng & Units 07/27/2021 07/26/2021 07/26/2021  Glucose 70 - 99 mg/dL 143(H) 157(H) 197(H)  BUN 8 - 23 mg/dL 35(H) 34(H) 30(H)  Creatinine 0.61 - 1.24 mg/dL 1.74(H) 1.78(H) 2.00(H)  Sodium 135 - 145 mmol/L 144 143 139  Potassium 3.5 - 5.1 mmol/L 3.5 3.3(L) 2.9(L)  Chloride 98 - 111 mmol/L 116(H) 112(H) 108  CO2 22 - 32 mmol/L 18(L) 21(L) 16(L)  Calcium 8.9 - 10.3 mg/dL 8.1(L) 8.7(L) 8.7(L)  Total Protein 6.5 - 8.1 g/dL - - 6.4(L)  Total Bilirubin 0.3 - 1.2 mg/dL - - 1.2  Alkaline Phos 38 - 126 U/L - - 55  AST 15 - 41 U/L - - 19  ALT 0 - 44 U/L - - 12     Radiology Studies: Abd 1 View (KUB)  Result Date: 07/27/2021 CLINICAL DATA:  Constipation EXAM: ABDOMEN - 1 VIEW COMPARISON:  None. FINDINGS: Nonobstructive bowel gas pattern. Mild amount of retained fecal material in the colon. Retained fecal material in the rectum also seen. No suspicious calcifications visualized. IMPRESSION: Retained fecal material mostly in the rectum. Electronically Signed   By: Ofilia Neas M.D.   On: 07/27/2021 08:19   CT Head Wo Contrast  Result Date: 07/26/2021 CLINICAL DATA:  Neck trauma (Age >= 65y); Head trauma, minor (Age >= 65y). Fall, now with gait abnormality and lower extremity weakness EXAM: CT HEAD WITHOUT CONTRAST CT CERVICAL SPINE WITHOUT CONTRAST TECHNIQUE: Multidetector CT imaging of the head and cervical spine was performed following the standard protocol without intravenous contrast. Multiplanar CT image reconstructions of the cervical spine were also generated. COMPARISON:  None. FINDINGS: CT HEAD FINDINGS Brain:  Mild parenchymal volume loss is commensurate with the patient's age. Mild periventricular white matter changes are present likely reflecting the sequela of small vessel ischemia. Hypoattenuation within the a left subinsular and periventricular white matter adjacent to the anterior horn of the left lateral ventricle is unchanged most in keeping with a remote white matter infarct. No evidence of acute intracranial hemorrhage or infarct. 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 No intra or extra-axial fluid collection. Ventricular size is borderline enlarged, likely the sequela of central atrophy. The cerebellum is unremarkable. Vascular: No hyperdense vessel or unexpected calcification. Skull: Normal. Negative for fracture or focal lesion. Sinuses/Orbits: Paranasal sinuses are clear. Ocular lenses have been removed. Orbits are otherwise unremarkable. Other: Mastoid air cells and middle ear cavities are clear. CT CERVICAL SPINE FINDINGS Alignment: Normal. Skull base and vertebrae: No acute fracture. No primary bone lesion or  focal pathologic process. Soft tissues and spinal canal: No prevertebral fluid or swelling. No visible canal hematoma. Disc levels: There is diffuse intervertebral disc space narrowing and endplate remodeling throughout the cervical spine in keeping with changes of diffuse degenerative disc disease. Prevertebral soft tissues are not thickened. Spinal canal demonstrates mild stenosis secondary to a posterior disc osteophyte complex at C5-6 which mildly flattens the thecal sac. AP diameter of the spinal canal in this region is 7-8 mm. Similar changes are seen at C6-7. Multilevel uncovertebral and facet arthrosis results in multilevel mild-to-moderate neuroforaminal narrowing, most severe at C3-4 and C6-7 on the left and C4-5 on the right. Upper chest: Unremarkable Other: None IMPRESSION: No  acute intracranial injury.  No calvarial fracture. No acute fracture or listhesis of the cervical spine. Electronically Signed   By: Fidela Salisbury M.D.   On: 07/26/2021 01:49   CT Cervical Spine Wo Contrast  Result Date: 07/26/2021 CLINICAL DATA:  Neck trauma (Age >= 65y); Head trauma, minor (Age >= 65y). Fall, now with gait abnormality and lower extremity weakness EXAM: CT HEAD WITHOUT CONTRAST CT CERVICAL SPINE WITHOUT CONTRAST TECHNIQUE: Multidetector CT imaging of the head and cervical spine was performed following the standard protocol without intravenous contrast. Multiplanar CT image reconstructions of the cervical spine were also generated. COMPARISON:  None. FINDINGS: CT HEAD FINDINGS Brain: Mild parenchymal volume loss is commensurate with the patient's age. Mild periventricular white matter changes are present likely reflecting the sequela of small vessel ischemia. Hypoattenuation within the a left subinsular and periventricular white matter adjacent to the anterior horn of the left lateral ventricle is unchanged most in keeping with a remote white matter infarct. No evidence of acute intracranial hemorrhage or infarct. 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 No intra or extra-axial fluid collection. Ventricular size is borderline enlarged, likely the sequela of central atrophy. The cerebellum is unremarkable. Vascular: No hyperdense vessel or unexpected calcification. Skull: Normal. Negative for fracture or focal lesion. Sinuses/Orbits: Paranasal sinuses are clear. Ocular lenses have been removed. Orbits are otherwise unremarkable. Other: Mastoid air cells and middle ear cavities are clear. CT CERVICAL SPINE FINDINGS Alignment: Normal. Skull base and vertebrae: No acute fracture. No primary bone lesion or focal pathologic process. Soft tissues and spinal canal: No prevertebral fluid or  swelling. No visible canal hematoma. Disc levels: There is diffuse intervertebral disc space narrowing and endplate remodeling throughout the cervical spine in keeping with changes of diffuse degenerative disc disease. Prevertebral soft tissues are not thickened. Spinal canal demonstrates mild stenosis secondary to a posterior disc osteophyte complex at C5-6 which mildly flattens the thecal sac. AP diameter of the spinal canal in this region is 7-8 mm. Similar changes are seen at C6-7. Multilevel uncovertebral and facet arthrosis results in multilevel mild-to-moderate neuroforaminal narrowing, most severe at C3-4 and C6-7 on the left and C4-5 on the right. Upper chest: Unremarkable Other: None IMPRESSION: No acute intracranial injury.  No calvarial fracture. No acute fracture or listhesis of the cervical spine. Electronically Signed   By: Fidela Salisbury M.D.   On: 07/26/2021 01:49   CT Renal Stone Study  Result Date: 07/26/2021 CLINICAL DATA:  Urinary retention EXAM: CT ABDOMEN AND PELVIS WITHOUT CONTRAST TECHNIQUE: Multidetector CT imaging of the abdomen and pelvis was performed following the standard protocol without IV contrast. COMPARISON:  None. FINDINGS: Lower chest: There is multifocal consolidation of the right lung base  which may relate to infection or aspiration in the acute setting. Small right pleural effusion is present. Extensive coronary artery calcification. Global cardiac size within normal limits. Small hiatal hernia. Hepatobiliary: No focal liver abnormality is seen. No gallstones, gallbladder wall thickening, or biliary dilatation. Pancreas: Unremarkable Spleen: Unremarkable Adrenals/Urinary Tract: The adrenal glands are unremarkable. The kidneys are normal in size and position. 5 mm nonobstructing calculus noted within the upper pole the left kidney. No ureteral calculi. No hydronephrosis. Foley catheter balloon is seen within a decompressed bladder lumen. Stomach/Bowel: There is large volume  stool seen within the rectal vault with circumferential rectal wall thickening and mild perirectal inflammatory stranding suggesting changes of fecal impaction and superimposed stercoral proctitis. The stomach, small bowel, and large bowel are otherwise unremarkable. Appendix absent. Vascular/Lymphatic: Aortic atherosclerosis. No enlarged abdominal or pelvic lymph nodes. Reproductive: Status post prostatectomy. Other: No abdominal wall hernia. Musculoskeletal: No acute bone abnormality. Osseous structures are age-appropriate. IMPRESSION: Multifocal consolidation within the right lung base, infection versus aspiration. Small associated right pleural effusion. Large volume stool within the rectal vault in keeping with probable fecal impaction. 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 Mild left nonobstructing nephrolithiasis. No hydronephrosis. Complete decompression of the bladder with Foley catheter balloon in place. Aortic Atherosclerosis (ICD10-I70.0). Electronically Signed   By: Fidela Salisbury M.D.   On: 07/26/2021 01:55     Scheduled Meds:  aspirin EC  81 mg Oral Q1200   atorvastatin  80 mg Oral Daily   carvedilol  3.125 mg Oral BID WC   Chlorhexidine Gluconate Cloth  6 each Topical Daily   enoxaparin (LOVENOX) injection  30 mg Subcutaneous Q24H   insulin aspart  0-9 Units Subcutaneous TID WC   levothyroxine  100 mcg Oral QAC breakfast   ranolazine  500 mg Oral Daily   Continuous Infusions:  0.9 % NaCl with KCl 40 mEq / L 100 mL/hr at 07/27/21 0939   azithromycin Stopped (07/26/21 2250)   cefTRIAXone (ROCEPHIN)  IV Stopped (07/26/21 2341)     LOS: 0 days   Time spent: 25  Nita Sells, MD [facility] To contact the attending provider between 7A-7P or the covering provider during after hours 7P-7A, please log into the web site [email or web address] and access using universal [place] password for that web site. If you do not have the password, please call the hospital  operator.  07/27/2021, 10:47 AM

## 2021-07-27 NOTE — Evaluation (Signed)
Physical Therapy Evaluation Patient Details Name: Evan Wright MRN: [id number] DOB: March 19, 1925 Today's Date: 07/27/2021  History of Present Illness  88 y.o. male with medical history significant for CAD s/p DES, T2DM with peripheral neuropathy, CKD stage III, HTN, HLD, hypothyroidism, constipation, frequent falls who presented to the ED for evaluation after a fall at home. Dx of PNA.  Clinical Impression  Pt admitted with above diagnosis. Max assist for supine to sit, +2 mod assist for sit to stand from elevated bed with RW, pt incontinent of bowel in standing. He was able to stand ~1 minute for clean up, then LEs fatigued and buckled. ST-SNF recommended.  Pt currently with functional limitations due to the deficits listed below (see PT Problem List). Pt will benefit from skilled PT to increase their independence and safety with mobility to allow discharge to the venue listed below.          Recommendations for follow up therapy are one component of a multi-disciplinary discharge planning process, led by the attending physician.  Recommendations may be updated based on patient status, additional functional criteria and insurance authorization.  Follow Up Recommendations Skilled nursing-short term rehab (<3 hours/day)    Assistance Recommended at Discharge Frequent or constant Supervision/Assistance  Patient can return home with the following  Two people to help with walking and/or transfers;A lot of help with bathing/dressing/bathroom;Assistance with cooking/housework;Direct supervision/assist for medications management;Assist for transportation;Help with stairs or ramp for entrance    Equipment Recommendations None recommended by PT  Recommendations for Other Services       Functional Status Assessment Patient has had a recent decline in their functional status and demonstrates the ability to make significant improvements in function in a reasonable and predictable amount of time.      Precautions / Restrictions Precautions Precautions: Fall Precaution Comments: h/o multiple falls in past 6 months Restrictions Weight Bearing Restrictions: No      Mobility  Bed Mobility Overal bed mobility: Needs Assistance Bed Mobility: Supine to Sit;Sit to Supine     Supine to sit: Max assist Sit to supine: Total assist;+2 for physical assistance   General bed mobility comments: assist to raise trunk, assist to bring LEs into bed and control trunk descent    Transfers Overall transfer level: Needs assistance Equipment used: Rolling walker (2 wheels) Transfers: Sit to/from Stand;Bed to chair/wheelchair/BSC Sit to Stand: +2 physical assistance;+2 safety/equipment;Mod assist;From elevated surface           General transfer comment: sit to stand x 2 for pericare as pt was incontinent of bowel in standing, pt able to stand ~1 minute before LEs buckled; pt took a few side steps towards head of bed. Returned to bed for further cleanup.    Ambulation/Gait                  Stairs            Wheelchair Mobility    Modified Rankin (Stroke Patients Only)       Balance Overall balance assessment: Needs assistance Sitting-balance support: Feet supported;No upper extremity supported Sitting balance-Leahy Scale: Poor Sitting balance - Comments: posterior lean initially, then fair Postural control: Posterior lean Standing balance support: Bilateral upper extremity supported Standing balance-Leahy Scale: Poor Standing balance comment: relies on BUE support                             Pertinent Vitals/Pain Pain Assessment: Faces Faces Pain Scale:  No hurt    Home Living Family/patient expects to be discharged to:: Private residence Living Arrangements: Spouse/significant other Available Help at Discharge: Family Type of Home: House Home Access: Stairs to enter Entrance Stairs-Rails: Right Entrance Stairs-Number of Steps: 3   Home Layout:  Two level;Laundry or work area in [employer]: [place] (4 wheels);Rolling Walker (2 wheels);Transport chair Additional Comments: lives with wife who cannot drive due to vision and hearing issues; 2 daughters live locally and provide transportation to appointments    Prior Function Prior Level of Function : Needs assist             Mobility Comments: was walking with RW but having falls       Hand Dominance        Extremity/Trunk Assessment   Upper Extremity Assessment Upper Extremity Assessment: Defer to OT evaluation    Lower Extremity Assessment Lower Extremity Assessment: Generalized weakness;RLE deficits/detail;LLE deficits/detail RLE Deficits / Details: knee ext 4/5 RLE Sensation: decreased light touch LLE Deficits / Details: knee ext 4/5 LLE Sensation: decreased light touch    Cervical / Trunk Assessment Cervical / Trunk Assessment: Normal  Communication   Communication: No difficulties  Cognition Arousal/Alertness: Awake/alert Behavior During Therapy: WFL for tasks assessed/performed;Anxious Overall Cognitive Status: Within Functional Limits for tasks assessed                                          General Comments      Exercises     Assessment/Plan    PT Assessment Patient needs continued PT services  PT Problem List Decreased activity tolerance;Decreased balance;Decreased mobility;Decreased strength       PT Treatment Interventions Therapeutic activities;Therapeutic exercise;Gait training;Functional mobility training;Balance training;Patient/family education    PT Goals (Current goals can be found in the Care Plan section)  Acute Rehab PT Goals Patient Stated Goal: to get stronger PT Goal Formulation: With patient Time For Goal Achievement: 08/10/21 Potential to Achieve Goals: Fair    Frequency Min 2X/week     Co-evaluation               AM-PAC PT "6 Clicks" Mobility  Outcome Measure Help needed  turning from your back to your side while in a flat bed without using bedrails?: A Lot Help needed moving from lying on your back to sitting on the side of a flat bed without using bedrails?: A Lot Help needed moving to and from a bed to a chair (including a wheelchair)?: A Lot Help needed standing up from a chair using your arms (e.g., wheelchair or bedside chair)?: Total Help needed to walk in hospital room?: Total Help needed climbing 3-5 steps with a railing? : Total 6 Click Score: 9    End of Session Equipment Utilized During Treatment: Gait belt Activity Tolerance: Patient limited by fatigue Patient left: in bed;with bed alarm set;with nursing/sitter in room Nurse Communication: Mobility status PT Visit Diagnosis: Difficulty in walking, not elsewhere classified (R26.2);Muscle weakness (generalized) (M62.81);History of falling (Z91.81)    Time: 0109-3235 PT Time Calculation (min) (ACUTE ONLY): 26 min   Charges:   PT Evaluation $PT Eval Moderate Complexity: 1 Mod PT Treatments $Therapeutic Activity: 8-22 mins       Blondell Reveal Kistler PT 07/27/2021  Acute Rehabilitation Services Pager [phone] Office [phone]

## 2021-07-27 NOTE — Evaluation (Signed)
Occupational Therapy Evaluation Patient Details Name: Evan Wright MRN: [id number] DOB: June 14, 1925 Today's Date: 07/27/2021   History of Present Illness 88 y.o. male with medical history significant for CAD s/p DES, T2DM with peripheral neuropathy, CKD stage III, HTN, HLD, hypothyroidism, constipation, frequent falls who presented to the ED for evaluation after a fall at home. Dx of PNA.   Clinical Impression   Chart reviewed, RN cleared pt for participation in OT evaluation. Pt greeted supine in bed, agreeable to evaluation. PTA pt lives with wife and completes ADL with MOD I with intermittent assist as needed. Pt is noted to have a BM, required MAX A for rolling, TOTAL A for peri care. Pt requires TOTAL A for LB dressing. OOB activity deferred on this date as pt is actively having a loose BM. Pt is able to assist with rolling, MOD A for boost up the bed. Education provided to pt and family re: role of OT/rehab, importance of position changes/ movement in bed, OOB mobility, dc recommendations. All questions answered appropriately. Pt presents with generalized weakness, decreased endurance, affecting safe and independent ADL completion. OT recommends discharge to STR to address functional deficits, if pt were to discharge home he would need 24/7 supervision, and significant assist for ADL and mobility. Pt is left as relieved, NAD, all needs met. OT will continue to follow while admitted.      Recommendations for follow up therapy are one component of a multi-disciplinary discharge planning process, led by the attending physician.  Recommendations may be updated based on patient status, additional functional criteria and insurance authorization.   Follow Up Recommendations  Skilled nursing-short term rehab (<3 hours/day)    Assistance Recommended at Discharge Frequent or constant Supervision/Assistance  Patient can return home with the following Two people to help with walking and/or transfers;A lot  of help with bathing/dressing/bathroom;Assistance with cooking/housework;Direct supervision/assist for medications management;Assist for transportation;A lot of help with walking and/or transfers;Two people to help with bathing/dressing/bathroom;Assistance with feeding;Direct supervision/assist for financial management;Help with stairs or ramp for entrance    Functional Status Assessment  Patient has had a recent decline in their functional status and demonstrates the ability to make significant improvements in function in a reasonable and predictable amount of time.  Equipment Recommendations  BSC/3in1    Recommendations for Other Services       Precautions / Restrictions Precautions Precautions: Fall Precaution Comments: history of falls in the last 6 months Restrictions Weight Bearing Restrictions: No      Mobility Bed Mobility Overal bed mobility: Needs Assistance Bed Mobility: Supine to Sit;Sit to Supine     Supine to sit: Max assist Sit to supine: Total assist;+2 for physical assistance   General bed mobility comments: assist to raise trunk, assist to bring LEs into bed and control trunk descent    Transfers Overall transfer level: Needs assistance Equipment used: Rolling walker (2 wheels) Transfers: Sit to/from Stand;Bed to chair/wheelchair/BSC Sit to Stand: +2 physical assistance;+2 safety/equipment;Mod assist;From elevated surface           General transfer comment: sit to stand x 2 for pericare as pt was incontinent of bowel in standing, pt able to stand ~1 minute before LEs buckled; pt took a few side steps towards head of bed. Returned to bed for further cleanup.      Balance Overall balance assessment: Needs assistance Sitting-balance support: Feet supported;No upper extremity supported Sitting balance-Leahy Scale: Poor Sitting balance - Comments: posterior lean initially, then fair Postural control: Posterior lean  Standing balance support: Bilateral  upper extremity supported Standing balance-Leahy Scale: Poor Standing balance comment: relies on BUE support                           ADL either performed or assessed with clinical judgement   ADL Overall ADL's : Needs assistance/impaired Eating/Feeding: Moderate assistance;Bed level   Grooming: Wash/dry hands;Wash/dry face;Bed level;Set up;Minimal assistance           Upper Body Dressing : Moderate assistance Upper Body Dressing Details (indicate cue type and reason): anticipated Lower Body Dressing: Total assistance;Bed level Lower Body Dressing Details (indicate cue type and reason): socks Toilet Transfer: Maximal assistance Toilet Transfer Details (indicate cue type and reason): does not occur as pt is actively having loose BM, anticipate MAX A Toileting- Clothing Manipulation and Hygiene: Total assistance;Maximal assistance Toileting - Clothing Manipulation Details (indicate cue type and reason): MAX A-TOTAL A for peri care following BM     Functional mobility during ADLs: Moderate assistance;Maximal assistance General ADL Comments: MOD A for boosting in bed (pt utilizing overhead rail to pull up); MOD-MAX A for rolling in bed for peri care, sup>sit does not occur due to pt having a loose BM     Vision Patient Visual Report: No change from baseline       Perception     Praxis      Pertinent Vitals/Pain Pain Assessment: No/denies pain Faces Pain Scale: No hurt     Hand Dominance     Extremity/Trunk Assessment Upper Extremity Assessment Upper Extremity Assessment: Generalized weakness;RUE deficits/detail RUE Deficits / Details: grossly 4-/5 [place] BUE   Lower Extremity Assessment Lower Extremity Assessment: Generalized weakness;RLE deficits/detail;LLE deficits/detail RLE Deficits / Details: knee ext 4/5 RLE Sensation: decreased light touch LLE Deficits / Details: knee ext 4/5 LLE Sensation: decreased light touch;history of peripheral  neuropathy   Cervical / Trunk Assessment Cervical / Trunk Assessment: Normal   Communication Communication Communication: No difficulties   Cognition Arousal/Alertness: Awake/alert Behavior During Therapy: WFL for tasks assessed/performed;Anxious Overall Cognitive Status: Within Functional Limits for tasks assessed                                 General Comments: alert and oriented x4, good historian     General Comments  abrasians noted on B knees from fall    Exercises     Shoulder Instructions      Home Living Family/patient expects to be discharged to:: Skilled nursing facility Living Arrangements: Spouse/significant other Available Help at Discharge: Family Type of Home: House Home Access: Stairs to enter [employer] of Steps: 3 Entrance Stairs-Rails: Right Home Layout: Two level;Laundry or work area in basement     [employer] Shower/Tub: Teacher, early years/pre: Handicapped height     Home Equipment: [place] (4 wheels);Rolling Walker (2 wheels);Transport chair (lift chair)          Prior Functioning/Environment Prior Level of Function : Needs assist       Physical Assist : Mobility (physical);ADLs (physical)   ADLs (physical): Grooming;Bathing;Dressing;Toileting;IADLs Mobility Comments: previously using RW but history of falls ADLs Comments: intermittent assist with ADL PTA; assist from daugthers for IADLs PTA(driving)        OT Problem List: Decreased strength;Decreased cognition;Decreased range of motion;Decreased activity tolerance;Decreased knowledge of use of DME or AE;Impaired UE functional use      OT Treatment/Interventions: Self-care/ADL training;DME  and/or AE instruction;Therapeutic activities;Balance training;Therapeutic exercise;Patient/family education    OT Goals(Current goals can be found in the care plan section) Acute Rehab OT Goals Patient Stated Goal: to walk to his mailbox to get his bills OT  Goal Formulation: With patient Time For Goal Achievement: 08/10/21 Potential to Achieve Goals: Fair ADL Goals Pt Will Perform Grooming: with set-up;sitting Pt Will Perform Upper Body Dressing: with min assist;sitting Pt Will Perform Lower Body Dressing: with min assist;sit to/from stand Pt Will Transfer to Toilet: with min assist Pt Will Perform Toileting - Clothing Manipulation and hygiene: with min assist  OT Frequency: Min 2X/week    Co-evaluation              AM-PAC OT "6 Clicks" Daily Activity     Outcome Measure Help from another person eating meals?: A Little Help from another person taking care of personal grooming?: A Little Help from another person toileting, which includes using toliet, bedpan, or urinal?: Total Help from another person bathing (including washing, rinsing, drying)?: A Lot Help from another person to put on and taking off regular upper body clothing?: A Lot Help from another person to put on and taking off regular lower body clothing?: Total 6 Click Score: 12   End of Session Nurse Communication: Mobility status  Activity Tolerance: Patient tolerated treatment well Patient left: in bed;with call bell/phone within reach;with bed alarm set  OT Visit Diagnosis: Unsteadiness on feet (R26.81);Repeated falls (R29.6);Muscle weakness (generalized) (M62.81)                Time: 6619-6940 OT Time Calculation (min): 45 min Charges:  OT General Charges $OT Visit: 1 Visit OT Evaluation $OT Eval Moderate Complexity: 1 Mod OT Treatments $Self Care/Home Management : 8-22 mins  Shanon Payor, OTD OTR/L  07/27/21, 2:48 PM

## 2021-07-28 LAB — CBC WITH DIFFERENTIAL/PLATELET
Abs Immature Granulocytes: 0.06 10*3/uL (ref 0.00–0.07)
Basophils Absolute: 0 10*3/uL (ref 0.0–0.1)
Basophils Relative: 0 %
Eosinophils Absolute: 0 10*3/uL (ref 0.0–0.5)
Eosinophils Relative: 0 %
HCT: 37.4 % — ABNORMAL LOW (ref 39.0–52.0)
Hemoglobin: 11.7 g/dL — ABNORMAL LOW (ref 13.0–17.0)
Immature Granulocytes: 1 %
Lymphocytes Relative: 6 %
Lymphs Abs: 0.6 10*3/uL — ABNORMAL LOW (ref 0.7–4.0)
MCH: 30.2 pg (ref 26.0–34.0)
MCHC: 31.3 g/dL (ref 30.0–36.0)
MCV: 96.6 fL (ref 80.0–100.0)
Monocytes Absolute: 0.3 10*3/uL (ref 0.1–1.0)
Monocytes Relative: 3 %
Neutro Abs: 9.2 10*3/uL — ABNORMAL HIGH (ref 1.7–7.7)
Neutrophils Relative %: 90 %
Platelets: 223 10*3/uL (ref 150–400)
RBC: 3.87 MIL/uL — ABNORMAL LOW (ref 4.22–5.81)
RDW: 13.1 % (ref 11.5–15.5)
WBC: 10.2 10*3/uL (ref 4.0–10.5)
nRBC: 0 % (ref 0.0–0.2)

## 2021-07-28 LAB — PROCALCITONIN: Procalcitonin: 0.49 ng/mL

## 2021-07-28 LAB — COMPREHENSIVE METABOLIC PANEL
ALT: 27 U/L (ref 0–44)
AST: 34 U/L (ref 15–41)
Albumin: 2.6 g/dL — ABNORMAL LOW (ref 3.5–5.0)
Alkaline Phosphatase: 56 U/L (ref 38–126)
Anion gap: 10 (ref 5–15)
BUN: 31 mg/dL — ABNORMAL HIGH (ref 8–23)
CO2: 19 mmol/L — ABNORMAL LOW (ref 22–32)
Calcium: 8.3 mg/dL — ABNORMAL LOW (ref 8.9–10.3)
Chloride: 114 mmol/L — ABNORMAL HIGH (ref 98–111)
Creatinine, Ser: 1.59 mg/dL — ABNORMAL HIGH (ref 0.61–1.24)
GFR, Estimated: 40 mL/min — ABNORMAL LOW (ref >60)
Glucose, Bld: 143 mg/dL — ABNORMAL HIGH (ref 70–99)
Potassium: 5.1 mmol/L (ref 3.5–5.1)
Sodium: 143 mmol/L (ref 135–145)
Total Bilirubin: 0.6 mg/dL (ref 0.3–1.2)
Total Protein: 5.9 g/dL — ABNORMAL LOW (ref 6.5–8.1)

## 2021-07-28 LAB — GLUCOSE, CAPILLARY
Glucose-Capillary: 125 mg/dL — ABNORMAL HIGH (ref 70–99)
Glucose-Capillary: 140 mg/dL — ABNORMAL HIGH (ref 70–99)
Glucose-Capillary: 138 mg/dL — ABNORMAL HIGH (ref 70–99)
Glucose-Capillary: 136 mg/dL — ABNORMAL HIGH (ref 70–99)

## 2021-07-28 MED ORDER — SODIUM CHLORIDE 0.9 % IV SOLN: INTRAVENOUS | Status: DC

## 2021-07-28 MED ORDER — AZITHROMYCIN 250 MG PO TABS
500.0000 mg | ORAL_TABLET | Freq: Every day | ORAL | Status: DC
  Administered 2021-07-28: 500 mg via ORAL
  Filled 2021-07-28: qty 2

## 2021-07-28 NOTE — Evaluation (Signed)
SLP Cancellation Note  Patient Details Name: Evan Wright MRN: [id number] DOB: 03/07/1925   Cancelled treatment:       Reason Eval/Treat Not Completed: Other (comment) (SLP apologizes for delay in services, Sunday SLP to see pt in am on 07/29/2021, per communication.  RN Advertising account planner) relayed to this SLP via secure chat that the pt "does well" and knows to sit upright with po. She approved deferring until 1/8. Thank you)  Kathleen Lime, MS [facility] SLP [place] Office [phone] Cell [phone]  Macario Golds 07/28/2021, 7:15 PM

## 2021-07-28 NOTE — Progress Notes (Signed)
PROGRESS NOTE   Evan Wright  KGM:[id number] DOB: 1925-03-19 DOA: 07/25/2021 PCP: Evan Held, DO  Brief Narrative:  88 year old white male community dwelling CAD status post PCI 2019 with ischemic cardiomyopathy now currently on only aspirin, HTN HLD Subdural hematoma 09/2018 Onychogryphosis Mild to moderate aortic regurgitation, ascending aortic aneurysm not wishing intervention Neuropathy likely secondary to DM TY 2  Came to emergency room Evan Wright 07/25/2021 with frequent falls-no LOC however had generalized fatigue in the setting of multiple loose BMs recently   Work-up including CT head abdomen etc. showed mainly fecal impaction and distended bladder Patient apparently disimpacted in the ED Foley placed because of retention On CT however there was also findings suggestive of aspiration-patient was started on broad-spectrum antibiotics ceftriaxone and azithromycin KUB was obtained with regards to fecal impaction For AKI patient was started on fluids Potassium was repleted  Hospital-Problem based course  Possible aspiration Pneumonia Found on CT scan continue ceftriaxone/azithromycin today and de-escalate in am once SLP confirms aspiration risk--White count is improved SLP requested to see non-emergently as he doesn't overtly seem to cough with eating Multiple frequent falls Patient lives with wife at home--therapy recs SNF Orthostatics pending Keep on telemetry to ensure no arrhythmia Fecal impaction urinary retention Both likely related--he was manually disimpacted in the ED His KUB still shows persisting stool--we will repeat this on 1/8 ensure no further retention of stool Failed Voiding trial 1/6 Start bowel regimen with MiraLAX and senna Hypokalemia on admission 2.9 Mild metabolic acidosis AKI's / CKD 3B of 30/2.0 Change IVF to NS 75 cc/h without K, as potassium is improved  Reassess labs a.m.--overall improved CAD status post PCI 2019, ischemic  cardiomyopathy Continue Coreg 3.125 cautiously, continue Ranexa 500 daily, continue aspirin 81 daily DM TY 2 Recent A1c 7.1 Taken off metformin recently 2/2 GI issues Will likely require start low dose alternate prior to d/c or in OP May be reasonable given his advanced age to discontinue all glycemic control  DVT prophylaxis: Lovenox Code Status: Full Family Communication:  Disposition:  Status is: Observation  The patient will require care spanning > 2 midnights and should be moved to inpatient because: needs work up  Consultants:    Procedures:   Antimicrobials:     Subjective:  Awake coherent no distress No overt coughing Main complaint is several liquid stools He does not have a fever Him and his family are curious about where he will be discharged to as will need to go to skilled facility  Objective: Vitals:   07/27/21 1749 07/27/21 2038 07/28/21 0439 07/28/21 1352  BP: (!) 144/71 110/61 (!) 143/64 (!) 156/72  Pulse: 66 68 63 73  Resp:  16 20 14   Temp:  97.9 F (36.6 C) 97.7 F (36.5 C) (!) 97.5 F (36.4 C)  TempSrc:  Oral Oral Oral  SpO2:  100% 99% 99%  Weight:      Height:        Intake/Output Summary (Last 24 hours) at 07/28/2021 1431 Last data filed at 07/28/2021 1030 Gross per 24 hour  Intake 2140.12 ml  Output 1400 ml  Net 740.12 ml    Filed Weights   07/25/21 2330 07/26/21 1804  Weight: 70.7 kg 68.6 kg    Examination:  Awake alert white male looks about stated age no distress Chest clear anterolaterally and posterolaterally no rales rhonchi Abdomen soft no rebound no guarding S1-S2 no murmur no rub no gallop Neurologically intact no focal deficit moving 4 limbs equally  but limited by effort and weakness No lower extremity edema  Data Reviewed: personally reviewed   CBC    Component Value Date/Time   WBC 10.2 07/28/2021 0615   RBC 3.87 (L) 07/28/2021 0615   HGB 11.7 (L) 07/28/2021 0615   HGB 13.4 03/13/2021 1347   HCT 37.4 (L)  07/28/2021 0615   HCT 41.9 03/13/2021 1347   PLT 223 07/28/2021 0615   PLT 246 03/13/2021 1347   MCV 96.6 07/28/2021 0615   MCV 94 03/13/2021 1347   MCH 30.2 07/28/2021 0615   MCHC 31.3 07/28/2021 0615   RDW 13.1 07/28/2021 0615   RDW 12.1 03/13/2021 1347   LYMPHSABS 0.6 (L) 07/28/2021 0615   LYMPHSABS 1.0 03/13/2021 1347   MONOABS 0.3 07/28/2021 0615   EOSABS 0.0 07/28/2021 0615   EOSABS 0.2 03/13/2021 1347   BASOSABS 0.0 07/28/2021 0615   BASOSABS 0.0 03/13/2021 1347   CMP Latest Ref Rng & Units 07/28/2021 07/27/2021 07/26/2021  Glucose 70 - 99 mg/dL 143(H) 143(H) 157(H)  BUN 8 - 23 mg/dL 31(H) 35(H) 34(H)  Creatinine 0.61 - 1.24 mg/dL 1.59(H) 1.74(H) 1.78(H)  Sodium 135 - 145 mmol/L 143 144 143  Potassium 3.5 - 5.1 mmol/L 5.1 3.5 3.3(L)  Chloride 98 - 111 mmol/L 114(H) 116(H) 112(H)  CO2 22 - 32 mmol/L 19(L) 18(L) 21(L)  Calcium 8.9 - 10.3 mg/dL 8.3(L) 8.1(L) 8.7(L)  Total Protein 6.5 - 8.1 g/dL 5.9(L) - -  Total Bilirubin 0.3 - 1.2 mg/dL 0.6 - -  Alkaline Phos 38 - 126 U/L 56 - -  AST 15 - 41 U/L 34 - -  ALT 0 - 44 U/L 27 - -     Radiology Studies: DG Chest 2 View  Result Date: 07/27/2021 CLINICAL DATA:  Pneumonia EXAM: CHEST - 2 VIEW COMPARISON:  Chest radiograph dated September 28, 2018 FINDINGS: The heart size and mediastinal contours are within normal limits. Aorta is tortuous with atherosclerotic calcifications. Both lungs are clear without evidence of focal consolidation or pleural effusion. Osteopenia with degenerative disc disease of the thoracic spine. IMPRESSION: No active cardiopulmonary disease. Electronically Signed   By: Keane Police D.O.   On: 07/27/2021 11:54   Abd 1 View (KUB)  Result Date: 07/27/2021 CLINICAL DATA:  Constipation EXAM: ABDOMEN - 1 VIEW COMPARISON:  None. FINDINGS: Nonobstructive bowel gas pattern. Mild amount of retained fecal material in the colon. Retained fecal material in the rectum also seen. No suspicious calcifications visualized. IMPRESSION:  Retained fecal material mostly in the rectum. Electronically Signed   By: Ofilia Neas M.D.   On: 07/27/2021 08:19     Scheduled Meds:  aspirin EC  81 mg Oral Q1200   atorvastatin  80 mg Oral Daily   azithromycin  500 mg Oral q1800   carvedilol  3.125 mg Oral BID WC   Chlorhexidine Gluconate Cloth  6 each Topical Daily   enoxaparin (LOVENOX) injection  30 mg Subcutaneous Q24H   insulin aspart  0-9 Units Subcutaneous TID WC   levothyroxine  100 mcg Oral QAC breakfast   polyethylene glycol  17 g Oral Daily   ranolazine  500 mg Oral Daily   senna-docusate  1 tablet Oral QHS   Continuous Infusions:  sodium chloride     cefTRIAXone (ROCEPHIN)  IV Stopped (07/27/21 2157)     LOS: 1 day   Time spent: [place], MD [facility] To contact the attending provider between 7A-7P or the covering provider during after hours 7P-7A, please  log into the web site [email or web address] and access using universal [place] password for that web site. If you do not have the password, please call the hospital operator.  07/28/2021, 2:31 PM

## 2021-07-28 NOTE — Progress Notes (Signed)
PHARMACIST - PHYSICIAN COMMUNICATION  CONCERNING: Antibiotic IV to Oral Route Change Policy  RECOMMENDATION: This patient is receiving azithromycin by the intravenous route.  Based on criteria approved by the Pharmacy and Therapeutics Committee, the antibiotic(s) is/are being converted to the equivalent oral dose form(s).   DESCRIPTION: These criteria include:  Patient being treated for a respiratory tract infection, urinary tract infection, cellulitis or clostridium difficile associated diarrhea if on metronidazole  The patient is not neutropenic and does not exhibit a GI malabsorption state  The patient is eating (either orally or via tube) and/or has been taking other orally administered medications for a least 24 hours  The patient is improving clinically and has a Tmax < 100.5  If you have questions about this conversion, please contact the Pharmacy Department  []  ( [phone] )  [place] []  ( [phone] )  [facility] []  ( [phone]  [place] []  ( [phone] )  [facility] [x]  ( [phone] )  [facility]  

## 2021-07-29 ENCOUNTER — Inpatient Hospital Stay (HOSPITAL_COMMUNITY): Payer: Medicare Other

## 2021-07-29 LAB — CBC WITH DIFFERENTIAL/PLATELET
Abs Immature Granulocytes: 0.05 10*3/uL (ref 0.00–0.07)
Basophils Absolute: 0 10*3/uL (ref 0.0–0.1)
Basophils Relative: 0 %
Eosinophils Absolute: 0 10*3/uL (ref 0.0–0.5)
Eosinophils Relative: 0 %
HCT: 36.6 % — ABNORMAL LOW (ref 39.0–52.0)
Hemoglobin: 11.4 g/dL — ABNORMAL LOW (ref 13.0–17.0)
Immature Granulocytes: 1 %
Lymphocytes Relative: 6 %
Lymphs Abs: 0.6 10*3/uL — ABNORMAL LOW (ref 0.7–4.0)
MCH: 30.3 pg (ref 26.0–34.0)
MCHC: 31.1 g/dL (ref 30.0–36.0)
MCV: 97.3 fL (ref 80.0–100.0)
Monocytes Absolute: 0.4 10*3/uL (ref 0.1–1.0)
Monocytes Relative: 5 %
Neutro Abs: 8 10*3/uL — ABNORMAL HIGH (ref 1.7–7.7)
Neutrophils Relative %: 88 %
Platelets: 227 10*3/uL (ref 150–400)
RBC: 3.76 MIL/uL — ABNORMAL LOW (ref 4.22–5.81)
RDW: 12.9 % (ref 11.5–15.5)
WBC: 9.1 10*3/uL (ref 4.0–10.5)
nRBC: 0 % (ref 0.0–0.2)

## 2021-07-29 LAB — GLUCOSE, CAPILLARY
Glucose-Capillary: 127 mg/dL — ABNORMAL HIGH (ref 70–99)
Glucose-Capillary: 146 mg/dL — ABNORMAL HIGH (ref 70–99)
Glucose-Capillary: 151 mg/dL — ABNORMAL HIGH (ref 70–99)
Glucose-Capillary: 137 mg/dL — ABNORMAL HIGH (ref 70–99)

## 2021-07-29 LAB — PROCALCITONIN: Procalcitonin: 0.34 ng/mL

## 2021-07-29 LAB — BASIC METABOLIC PANEL WITH GFR
Anion gap: 10 (ref 5–15)
BUN: 24 mg/dL — ABNORMAL HIGH (ref 8–23)
CO2: 17 mmol/L — ABNORMAL LOW (ref 22–32)
Calcium: 8.2 mg/dL — ABNORMAL LOW (ref 8.9–10.3)
Chloride: 116 mmol/L — ABNORMAL HIGH (ref 98–111)
Creatinine, Ser: 1.43 mg/dL — ABNORMAL HIGH (ref 0.61–1.24)
GFR, Estimated: 45 mL/min — ABNORMAL LOW
Glucose, Bld: 140 mg/dL — ABNORMAL HIGH (ref 70–99)
Potassium: 4.1 mmol/L (ref 3.5–5.1)
Sodium: 143 mmol/L (ref 135–145)

## 2021-07-29 MED ORDER — LEVOFLOXACIN 500 MG PO TABS
500.0000 mg | ORAL_TABLET | Freq: Every day | ORAL | Status: DC
  Administered 2021-07-29: 500 mg via ORAL
  Filled 2021-07-29: qty 1

## 2021-07-29 NOTE — Progress Notes (Signed)
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 showed mainly fecal impaction and distended bladder Patient apparently disimpacted in the ED Foley placed because of retention On CT however there was also findings suggestive of aspiration-patient was started on broad-spectrum antibiotics ceftriaxone and azithromycin KUB was obtained with regards to fecal impaction For AKI patient was started on fluids Potassium was repleted  Hospital-Problem based course  Possible aspiration Pneumonia Found on CT scan Ceftriaxone azithromycin converted to, Levaquin stop date = 07/31/2021  SLP input appreciated-they are recommending dysphagia 3 diet Multiple frequent falls Patient lives with wife at home--therapy recs SNF Orthostatics pending Keep on telemetry to ensure no arrhythmia Fecal impaction urinary retention Both likely related--he was manually disimpacted in the ED Repeat KUB 1/8 shows that the bowel was cleaned out continue MiraLAX discontinue senna Failed Voiding trial 1/6-Will need outpatient urology follow-up for urinary catheter management Hypokalemia on admission 2.9 Mild metabolic acidosis AKI's / CKD 3B of 30/2.0 IV fluids DC 1/8  Outpatient labs CAD status post PCI 2019, ischemic cardiomyopathy Continue Coreg 3.125 cautiously, continue Ranexa 500 daily, continue aspirin 81 daily DM TY 2 Recent A1c 7.1 Taken  off metformin recently 2/2 GI issues Will likely require start low dose alternate prior to d/c or in OP May be reasonable given his advanced age to discontinue all glycemic control  DVT prophylaxis: Lovenox Code Status: Full Family Communication:  Disposition:  Status is: Observation  The patient will require care spanning > 2 midnights and should be moved to inpatient because: needs work up  Consultants:    Procedures:   Antimicrobials:     Subjective:  Has a headache this morning otherwise feels fair No fever no chills Not really eating much Speech therapy input noted and placed on dysphagia 3  Objective: Vitals:   07/28/21 1352 07/28/21 2142 07/29/21 0500 07/29/21 0536  BP: (!) 156/72 (!) 144/68  137/61  Pulse: 73 66  63  Resp: 14 16  16   Temp: (!) 97.5 F (36.4 C) (!) 97.4 F (36.3 C)  98.1 F (36.7 C)  TempSrc: Oral Oral  Oral  SpO2: 99% 100%  100%  Weight:   73.5 kg   Height:        Intake/Output Summary (Last 24 hours) at 07/29/2021 1100 Last data filed at 07/29/2021 1058 Gross per 24 hour  Intake 2838.56 ml  Output 2150 ml  Net 688.56 ml    Filed Weights   07/25/21 2330 07/26/21 1804 07/29/21 0500  Weight: 70.7 kg 68.6 kg 73.5 kg    Examination:  Awake alert white male looks about stated age no distress Chest clear anterolaterally and lateral lung fields Abdomen soft no rebound no guarding S1-S2 no murmur no rub no gallop Neurologically intact no focal deficit moving 4 limbs  No lower extremity edema  Data Reviewed: personally reviewed   CBC    Component Value Date/Time   WBC 9.1 07/29/2021 0609   RBC 3.76 (L) 07/29/2021 0609   HGB  11.4 (L) 07/29/2021 0609   HGB 13.4 03/13/2021 1347   HCT 36.6 (L) 07/29/2021 0609   HCT 41.9 03/13/2021 1347   PLT 227 07/29/2021 0609   PLT 246 03/13/2021 1347   MCV 97.3 07/29/2021 0609   MCV 94 03/13/2021 1347   MCH 30.3 07/29/2021 0609   MCHC 31.1 07/29/2021 0609   RDW 12.9 07/29/2021 0609   RDW  12.1 03/13/2021 1347   LYMPHSABS 0.6 (L) 07/29/2021 0609   LYMPHSABS 1.0 03/13/2021 1347   MONOABS 0.4 07/29/2021 0609   EOSABS 0.0 07/29/2021 0609   EOSABS 0.2 03/13/2021 1347   BASOSABS 0.0 07/29/2021 0609   BASOSABS 0.0 03/13/2021 1347   CMP Latest Ref Rng & Units 07/29/2021 07/28/2021 07/27/2021  Glucose 70 - 99 mg/dL 140(H) 143(H) 143(H)  BUN 8 - 23 mg/dL 24(H) 31(H) 35(H)  Creatinine 0.61 - 1.24 mg/dL 1.43(H) 1.59(H) 1.74(H)  Sodium 135 - 145 mmol/L 143 143 144  Potassium 3.5 - 5.1 mmol/L 4.1 5.1 3.5  Chloride 98 - 111 mmol/L 116(H) 114(H) 116(H)  CO2 22 - 32 mmol/L 17(L) 19(L) 18(L)  Calcium 8.9 - 10.3 mg/dL 8.2(L) 8.3(L) 8.1(L)  Total Protein 6.5 - 8.1 g/dL - 5.9(L) -  Total Bilirubin 0.3 - 1.2 mg/dL - 0.6 -  Alkaline Phos 38 - 126 U/L - 56 -  AST 15 - 41 U/L - 34 -  ALT 0 - 44 U/L - 27 -     Radiology Studies: DG Chest 2 View  Result Date: 07/27/2021 CLINICAL DATA:  Pneumonia EXAM: CHEST - 2 VIEW COMPARISON:  Chest radiograph dated September 28, 2018 FINDINGS: The heart size and mediastinal contours are within normal limits. Aorta is tortuous with atherosclerotic calcifications. Both lungs are clear without evidence of focal consolidation or pleural effusion. Osteopenia with degenerative disc disease of the thoracic spine. IMPRESSION: No active cardiopulmonary disease. Electronically Signed   By: Keane Police D.O.   On: 07/27/2021 11:54   DG Abd 1 View  Result Date: 07/29/2021 CLINICAL DATA:  Fecal impaction and urinary retention. EXAM: ABDOMEN - 1 VIEW COMPARISON:  07/27/2021. FINDINGS: Normal bowel gas pattern. No significant increase in colonic stool burden. Rectum mildly distended with stool, decreased compared to the prior exam. IMPRESSION: 1. Interval decrease in rectal stool, rectum now only mildly distended with stool. 2. No significant increase in colonic stool burden, also decreased from the previous exam. Electronically Signed   By: Lajean Manes M.D.   On: 07/29/2021 08:33      Scheduled Meds:  aspirin EC  81 mg Oral Q1200   atorvastatin  80 mg Oral Daily   carvedilol  3.125 mg Oral BID WC   Chlorhexidine Gluconate Cloth  6 each Topical Daily   enoxaparin (LOVENOX) injection  30 mg Subcutaneous Q24H   insulin aspart  0-9 Units Subcutaneous TID WC   levofloxacin  500 mg Oral q1800   levothyroxine  100 mcg Oral QAC breakfast   polyethylene glycol  17 g Oral Daily   ranolazine  500 mg Oral Daily   Continuous Infusions:  sodium chloride 75 mL/hr at 07/29/21 0556     LOS: 2 days   Time spent: [place], MD [facility] To contact the attending provider between 7A-7P or the covering provider during after hours 7P-7A, please log into the web site [email or web address] and access using universal [place] password for that web site. If you do not have the password, please call the hospital operator.  07/29/2021, 11:00 AM

## 2021-07-29 NOTE — Evaluation (Signed)
Clinical/Bedside Swallow Evaluation Patient Details  Name: Evan Wright MRN: [id number] Date of Birth: 03/05/1925  Today's Date: 07/29/2021 Time: SLP Start Time (ACUTE ONLY): 0830 SLP Stop Time (ACUTE ONLY): 0900 SLP Time Calculation (min) (ACUTE ONLY): 30 min  Past Medical History:  Past Medical History:  Diagnosis Date   Abnormal EKG 04/02/2017   Adenomatous colon polyp    Angina pectoris ([place]) 04/2017   CAD (coronary artery disease)    a. Cath 04/29/17-> high grade pLAD with R to L collaterals, high grade 1st dig  2. Cath 05/01/17 s/p ostial to mLAD with DES x 2.    CAD (coronary artery disease), native coronary artery 04/29/2017   Cardiac murmur    Coronary artery disease    De Quervain's tenosynovitis 05/18/2012   Diverticulosis    DIZZINESS 10/15/2007   Qualifier: Diagnosis of  By: [name]     Essential hypertension 09/25/2017   Family history of malignant neoplasm of gastrointestinal tract 05/14/2011   Brother with colon cancer    GASTROENTERITIS 01/20/2008   Qualifier: Diagnosis of  By: [name], [name]     GERD (gastroesophageal reflux disease)    HIATAL HERNIA WITH REFLUX 02/11/2008   Qualifier: Diagnosis of  By: [name]     History of hiatal hernia    Hyperglycemia 09/25/2017   Hyperlipidemia    Hypothyroidism 09/25/2017   Idiopathic peripheral neuropathy 10/11/2016   Intermittent claudication ([place]) 06/02/2020   Ischemic cardiomyopathy 04/10/2017   Loss of height 06/11/2016   Mixed dyslipidemia 04/10/2017   NEVI, MULTIPLE 07/12/2010   Qualifier: Diagnosis of  By: [name]     Nonsustained ventricular tachycardia 04/24/2017   Osteoarthritis    OSTEOARTHRITIS 07/12/2010   Qualifier: Diagnosis of  By: [name]     Osteopenia 09/25/2017   Personal history of colonic polyps 11/17/2007   Qualifier: Diagnosis of  By: [name], [name]    Preventative health care 09/23/2018   Prostate cancer [facility])    PROSTATE CANCER, HX OF 10/15/2007   Qualifier:  Diagnosis of  By: [name]     Proximal leg weakness 10/11/2016   Right arm weakness 09/28/2018   SDH (subdural hematoma) 09/28/2018   Seizure-like activity ([place]) 09/28/2018   Slow transit constipation 08/10/2019   Uncontrolled diabetes mellitus with hyperglycemia ([place]) 09/23/2018   Unspecified vitamin D deficiency 02/11/2008   Qualifier: Diagnosis of  By: [name]     Weakness of both lower extremities 06/11/2016   Past Surgical History:  Past Surgical History:  Procedure Laterality Date   APPENDECTOMY  1948   COLONOSCOPY     CORONARY STENT INTERVENTION N/A 05/01/2017   Procedure: CORONARY STENT INTERVENTION;  Surgeon: [name], [name], MD;  Location: [facility];  Service: Cardiovascular;  Laterality: N/A;   EYE SURGERY  2012   march and April  --Dr [name]   LEFT HEART CATH AND CORONARY ANGIOGRAPHY N/A 04/29/2017   Procedure: LEFT HEART CATH AND CORONARY ANGIOGRAPHY;  Surgeon: [name], MD;  Location: [facility];  Service: Cardiovascular;  Laterality: N/A;   PROSTATECTOMY     TONSILLECTOMY     HPI:  Evan Wright is a 88 y.o. male with medical history significant for CAD s/p DES, T2DM with peripheral neuropathy, CKD stage III, HTN, HLD, hypothyroidism, constipation, frequent falls who presented to the ED for evaluation after a fall at home. Incidental findings of RLL opacity on CT. Pt does confirm  dysphagia to both solids and liquids with h/o esophageal dilations.    Assessment / Plan / Recommendation  Clinical Impression  Evan Wright presents with +s/s aspiration and dysphagia. He has h/o esophageal dysphagia with multiple dilations and daily prevacid. No diagnosed h/o pharyngeal dysphagia. Pt was hyperverbal and distractable during this evaluation. Oral mech exam revealed generalized weakness with very weak cough and reduced breath support. He also has noted xerostomia. Pt reports having to take very small sips or he will choke. He also reports that many solid  consistencies result in regurgitation. His example was cake. He was unable to complete 3 oz water challenge d/t inability to follow directions; however, he was noted with weak cough response to thin liquid intake in 2/5 trials. He did not have s/s aspiration with purees or solids, but is noted with severely prolonged oral phase (difficult to discern if this was d/t distractions and talking t/o trial or functional weakness). He required cues to use liquid wash to clear oral cavity.  Recommend: soft solids, thin liquids, meds whole in puree or crushed in puree, MBSS to further assess swallow function, general aspiration precautions: diligent oral care BID, sit upright at 90 degrees during and 30 mins after all PO intake, small bites/sips, alternate solids/liquids  SLP Visit Diagnosis: Dysphagia, unspecified (R13.10)    Aspiration Risk  Moderate aspiration risk;Risk for inadequate nutrition/hydration    Diet Recommendation Thin liquid;Dysphagia 3 (Mech soft)   Liquid Administration via: Straw Medication Administration: Whole meds with puree Supervision: Patient able to self feed Compensations: Small sips/bites;Slow rate;Follow solids with liquid Postural Changes: Seated upright at 90 degrees;Remain upright for at least 30 minutes after po intake    Other  Recommendations Oral Care Recommendations: Oral care BID;Staff/trained caregiver to provide oral care    Recommendations for follow up therapy are one component of a multi-disciplinary discharge planning process, led by the attending physician.  Recommendations may be updated based on patient status, additional functional criteria and insurance authorization.  Follow up Recommendations Skilled nursing-short term rehab (<3 hours/day)      Assistance Recommended at Discharge Intermittent Supervision/Assistance  Functional Status Assessment Patient has had a recent decline in their functional status and demonstrates the ability to make  significant improvements in function in a reasonable and predictable amount of time.  Frequency and Duration min 2x/week  2 weeks       Prognosis Prognosis for Safe Diet Advancement: Fair      Swallow Study   General Date of Onset: 07/27/21 HPI: Evan Wright is a 88 y.o. male with medical history significant for CAD s/p DES, T2DM with peripheral neuropathy, CKD stage III, HTN, HLD, hypothyroidism, constipation, frequent falls who presented to the ED for evaluation after a fall at home. Incidental findings of RLL opacity on CT. Pt does confirm dysphagia to both solids and liquids with h/o esophageal dilations. Type of Study: Bedside Swallow Evaluation Previous Swallow Assessment: none Diet Prior to this Study: Regular;Thin liquids Temperature Spikes Noted: No Respiratory Status: Room air History of Recent Intubation: No Behavior/Cognition: Alert;Cooperative;Pleasant mood Oral Cavity Assessment: Within Functional Limits Oral Care Completed by SLP: Yes Oral Cavity - Dentition: Adequate natural dentition Vision: Functional for self-feeding Self-Feeding Abilities: Able to feed self Patient Positioning: Upright in bed Baseline Vocal Quality: Low vocal intensity Volitional Cough: Weak Volitional Swallow: Able to elicit    Oral/Motor/Sensory Function Overall Oral Motor/Sensory Function: Generalized oral weakness   Ice Chips Ice chips: Within functional limits Presentation: Spoon   Thin  Liquid Thin Liquid: Within functional limits Presentation: Straw    Nectar Thick Nectar Thick Liquid: Not tested   Honey Thick Honey Thick Liquid: Not tested   Puree Puree: Within functional limits Presentation: Spoon;Self Fed   Solid     Solid: Impaired Presentation: Self Fed Oral Phase Impairments: Impaired mastication Oral Phase Functional Implications: Oral residue;Prolonged oral transit     Evan Wright P. Yeslin Delio, M.S., CCC-SLP Speech-Language Pathologist Acute Rehabilitation  Services Pager: [place] 07/29/2021,9:05 AM

## 2021-07-30 ENCOUNTER — Inpatient Hospital Stay (HOSPITAL_COMMUNITY): Payer: Medicare Other

## 2021-07-30 LAB — COMPREHENSIVE METABOLIC PANEL
ALT: 21 U/L (ref 0–44)
AST: 18 U/L (ref 15–41)
Albumin: 2.5 g/dL — ABNORMAL LOW (ref 3.5–5.0)
Alkaline Phosphatase: 62 U/L (ref 38–126)
Anion gap: 5 (ref 5–15)
BUN: 20 mg/dL (ref 8–23)
CO2: 16 mmol/L — ABNORMAL LOW (ref 22–32)
Calcium: 8.1 mg/dL — ABNORMAL LOW (ref 8.9–10.3)
Chloride: 119 mmol/L — ABNORMAL HIGH (ref 98–111)
Creatinine, Ser: 1.64 mg/dL — ABNORMAL HIGH (ref 0.61–1.24)
GFR, Estimated: 39 mL/min — ABNORMAL LOW (ref >60)
Glucose, Bld: 161 mg/dL — ABNORMAL HIGH (ref 70–99)
Potassium: 3.8 mmol/L (ref 3.5–5.1)
Sodium: 140 mmol/L (ref 135–145)
Total Bilirubin: 0.6 mg/dL (ref 0.3–1.2)
Total Protein: 5.7 g/dL — ABNORMAL LOW (ref 6.5–8.1)

## 2021-07-30 LAB — GLUCOSE, CAPILLARY
Glucose-Capillary: 128 mg/dL — ABNORMAL HIGH (ref 70–99)
Glucose-Capillary: 153 mg/dL — ABNORMAL HIGH (ref 70–99)
Glucose-Capillary: 140 mg/dL — ABNORMAL HIGH (ref 70–99)
Glucose-Capillary: 154 mg/dL — ABNORMAL HIGH (ref 70–99)

## 2021-07-30 MED ORDER — LEVOFLOXACIN 500 MG PO TABS
500.0000 mg | ORAL_TABLET | ORAL | Status: AC
  Administered 2021-07-31: 500 mg via ORAL
  Filled 2021-07-30: qty 1

## 2021-07-30 MED ORDER — LEVOFLOXACIN 500 MG PO TABS: 500.0000 mg | ORAL_TABLET | ORAL | Status: DC

## 2021-07-30 NOTE — Care Management Important Message (Signed)
Important Message  Patient Details IM Letter placed in Patients room. Name: Evan Wright MRN: [id number] Date of Birth: 01-Jun-1925   Medicare Important Message Given:  Yes     Kerin Salen 07/30/2021, 12:06 PM

## 2021-07-30 NOTE — TOC Progression Note (Signed)
Transition of Care [facility]) - Progression Note    Patient Details  Name: Evan Wright MRN: [id number] Date of Birth: [birth year]  Transition of Care [facility] CM/SW Contact  Lashe Oliveira, Marjie Skiff, RN Phone Number: 07/30/2021, 1:33 PM  Clinical Narrative:    Spoke with daughter Malachy Mood to provide SNF bed offers. Malachy Mood encouraged to have a decision as soon as possible as we would like to secure the preferred bed for him. Malachy Mood states that "I cannot go see 6 facilities today so the decision will not be made today". TOC will follow up with Malachy Mood tomorrow for SNF bed choice.    Expected Discharge Plan: Skilled Nursing Facility Barriers to Discharge: Continued Medical Work up  Expected Discharge Plan and Services Expected Discharge Plan: [place]   Discharge Planning Services: CM Consult   Living arrangements for the past 2 months: Single Family Home                   Social Determinants of Health (SDOH) Interventions    Readmission Risk Interventions Readmission Risk Prevention Plan 07/30/2021  Transportation Screening Complete  PCP or Specialist Appt within 3-5 Days Complete  HRI or [place] Complete  Social Work Consult for [place] Planning/Counseling Complete  Palliative Care Screening Not Applicable  Medication Review Press photographer) Complete  Some recent data might be hidden

## 2021-07-30 NOTE — Progress Notes (Signed)
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 showed mainly fecal impaction and distended bladder Patient apparently disimpacted in the ED Foley placed because of retention On CT however there was also findings suggestive of aspiration-patient was started on broad-spectrum antibiotics ceftriaxone and azithromycin KUB was obtained with regards to fecal impaction For AKI patient was started on fluids Potassium was repleted  Patient developed unfortunately some delirium on 1/9  Hospital-Problem based course  Hospital-acquired delirium Reorient as best possible Will get labs to ensure no azotemia causing and follow--expect will improve Possible aspiration Pneumonia Found on CT scan Ceftriaxone azithromycin converted to, Levaquin stop date = 07/31/2021  SLP input appreciated-they are recommending dysphagia 3 diet Multiple frequent falls Patient lives with wife at home--therapy recs SNF Orthostatics pending Keep on telemetry to ensure no arrhythmia Fecal impaction urinary retention Both likely related--he was manually disimpacted in the ED Repeat KUB 1/8 shows that the bowel was cleaned out Stop all laxatives as frequent stools q. hourly  Failed Voiding trial 1/6-Will need outpatient urology follow-up for urinary catheter management Hypokalemia on admission 2.9 Mild metabolic acidosis AKI's / CKD 3B of 30/2.0 IV  fluids DC 1/8  Outpatient labs CAD status post PCI 2019, ischemic cardiomyopathy Continue Coreg 3.125 cautiously, continue Ranexa 500 daily, continue aspirin 81 daily DM TY 2 Recent A1c 7.1 Taken off metformin recently 2/2 GI issues Will likely require start low dose alternate prior to d/c or in OP May be reasonable given his advanced age to discontinue all glycemic control  DVT prophylaxis: Lovenox Code Status: Full Family Communication:  Disposition:  Status is: Observation  The patient will require care spanning > 2 midnights and should be moved to inpatient because: needs work up  Consultants:    Procedures:   Antimicrobials:     Subjective:  he is confused today  Knows where he is but seeing things that are not there and having some hallucinations Can tell me the name of the daughter talking on the phone  Objective: Vitals:   07/29/21 1357 07/29/21 2018 07/30/21 0350 07/30/21 1336  BP: (!) 151/65 (!) 152/66 (!) 150/85 (!) 172/82  Pulse: 67 67 72 66  Resp: 14 18 16 20   Temp: 97.7 F (36.5 C) 98.4 F (36.9 C) 98 F (36.7 C) 98.3 F (36.8 C)  TempSrc: Oral Oral Oral Oral  SpO2: 100% 99% 94% 96%  Weight:      Height:        Intake/Output Summary (Last 24 hours) at 07/30/2021 1401 Last data filed at 07/30/2021 0900 Gross per 24 hour  Intake 765.54 ml  Output 2200 ml  Net -1434.46 ml    Filed Weights   07/25/21 2330 07/26/21 1804 07/29/21 0500  Weight: 70.7 kg 68.6 kg 73.5 kg    Examination:  Awake looks about stated age no distress Chest clear anterolaterally and lateral lung fields Abdomen soft no rebound no guarding S1-S2 no murmur no rub no gallop Neurologically intact no focal deficit moving  4 limbs  No lower extremity edema Skin is intact he has some excoriation on Shins  Data Reviewed: personally reviewed   CBC    Component Value Date/Time   WBC 9.1 07/29/2021 0609   RBC 3.76 (L) 07/29/2021 0609   HGB 11.4 (L) 07/29/2021 0609   HGB  13.4 03/13/2021 1347   HCT 36.6 (L) 07/29/2021 0609   HCT 41.9 03/13/2021 1347   PLT 227 07/29/2021 0609   PLT 246 03/13/2021 1347   MCV 97.3 07/29/2021 0609   MCV 94 03/13/2021 1347   MCH 30.3 07/29/2021 0609   MCHC 31.1 07/29/2021 0609   RDW 12.9 07/29/2021 0609   RDW 12.1 03/13/2021 1347   LYMPHSABS 0.6 (L) 07/29/2021 0609   LYMPHSABS 1.0 03/13/2021 1347   MONOABS 0.4 07/29/2021 0609   EOSABS 0.0 07/29/2021 0609   EOSABS 0.2 03/13/2021 1347   BASOSABS 0.0 07/29/2021 0609   BASOSABS 0.0 03/13/2021 1347   CMP Latest Ref Rng & Units 07/29/2021 07/28/2021 07/27/2021  Glucose 70 - 99 mg/dL 140(H) 143(H) 143(H)  BUN 8 - 23 mg/dL 24(H) 31(H) 35(H)  Creatinine 0.61 - 1.24 mg/dL 1.43(H) 1.59(H) 1.74(H)  Sodium 135 - 145 mmol/L 143 143 144  Potassium 3.5 - 5.1 mmol/L 4.1 5.1 3.5  Chloride 98 - 111 mmol/L 116(H) 114(H) 116(H)  CO2 22 - 32 mmol/L 17(L) 19(L) 18(L)  Calcium 8.9 - 10.3 mg/dL 8.2(L) 8.3(L) 8.1(L)  Total Protein 6.5 - 8.1 g/dL - 5.9(L) -  Total Bilirubin 0.3 - 1.2 mg/dL - 0.6 -  Alkaline Phos 38 - 126 U/L - 56 -  AST 15 - 41 U/L - 34 -  ALT 0 - 44 U/L - 27 -     Radiology Studies: DG Abd 1 View  Result Date: 07/29/2021 CLINICAL DATA:  Fecal impaction and urinary retention. EXAM: ABDOMEN - 1 VIEW COMPARISON:  07/27/2021. FINDINGS: Normal bowel gas pattern. No significant increase in colonic stool burden. Rectum mildly distended with stool, decreased compared to the prior exam. IMPRESSION: 1. Interval decrease in rectal stool, rectum now only mildly distended with stool. 2. No significant increase in colonic stool burden, also decreased from the previous exam. Electronically Signed   By: Lajean Manes M.D.   On: 07/29/2021 08:33     Scheduled Meds:  aspirin EC  81 mg Oral Q1200   atorvastatin  80 mg Oral Daily   carvedilol  3.125 mg Oral BID WC   Chlorhexidine Gluconate Cloth  6 each Topical Daily   enoxaparin (LOVENOX) injection  30 mg Subcutaneous Q24H   insulin aspart   0-9 Units Subcutaneous TID WC   levofloxacin  500 mg Oral q1800   levothyroxine  100 mcg Oral QAC breakfast   ranolazine  500 mg Oral Daily   Continuous Infusions:     LOS: 3 days   Time spent: [place], MD [facility] To contact the attending provider between 7A-7P or the covering provider during after hours 7P-7A, please log into the web site [email or web address] and access using universal [place] password for that web site. If you do not have the password, please call the hospital operator.  07/30/2021, 2:01 PM

## 2021-07-30 NOTE — Progress Notes (Signed)
PHARMACY NOTE:  ANTIMICROBIAL RENAL DOSAGE ADJUSTMENT  Current antimicrobial regimen includes a mismatch between antimicrobial dosage and estimated renal function.  As per policy approved by the Pharmacy & Therapeutics and Medical Executive Committees, the antimicrobial dosage will be adjusted accordingly.  Current antimicrobial dosage:  levaquin 500 mg po qday  Indication: PNA  Renal Function:  Estimated Creatinine Clearance: 28.6 mL/min (A) (by C-G formula based on SCr of 1.64 mg/dL (H)). []      On intermittent HD, scheduled: []      On CRRT    Antimicrobial dosage has been changed to:  levaquin 500 mg po q48. Next & last dose 1/10 at 1800   Thank you for allowing pharmacy to be a part of this patient's care.  Eudelia Bunch, Pharm.D 07/30/2021 5:02 PM

## 2021-07-30 NOTE — [place] FL2 (Signed)
[place] MEDICAID FL2 LEVEL OF CARE SCREENING TOOL     IDENTIFICATION  Patient Name: Evan Wright Birthdate: 11/26/1924 Sex: male Admission Date (Current Location): 07/25/2021  [facility] and [place] Number:  Herbalist and Address:  [facility],  [place], [place]      Provider Number: [id number]  Attending Physician Name and Address:  Nita Sells, MD  Relative Name and Phone Number:       Current Level of Care: Hospital Recommended Level of Care: [place] Prior Approval Number:    Date Approved/Denied:   PASRR Number: [id number]  Discharge Plan: SNF    Current Diagnoses: Patient Active Problem List   Diagnosis Date Noted   PNA (pneumonia) 07/27/2021   Fecal impaction ([place]) 07/26/2021   Acute kidney injury superimposed on CKD ([place]) 07/26/2021   Urinary retention 07/26/2021   Frequent falls 07/26/2021   Hypokalemia 07/26/2021   Right lower lobe pneumonia 07/26/2021   Traumatic subdural hemorrhage with loss of consciousness of unspecified duration, initial encounter ([place]) 06/26/2021   Diarrhea 06/26/2021   Moderate aortic regurgitation 03/13/2021   Ascending aortic aneurysm 03/13/2021   Pedal edema 03/13/2021   Adenomatous colon polyp    CAD (coronary artery disease)    Cardiac murmur    Coronary artery disease    Diverticulosis    GERD (gastroesophageal reflux disease)    History of hiatal hernia    Hyperlipidemia associated with type 2 diabetes mellitus ([place])    Prostate cancer ([place])    Osteoarthritis    Intermittent claudication ([place]) 06/02/2020   Slow transit constipation 08/10/2019   SDH (subdural hematoma) 09/28/2018   Seizure-like activity ([place]) 09/28/2018   Right arm weakness 09/28/2018   Preventative health care 09/23/2018   Type 2 diabetes mellitus ([place]) 09/23/2018   Osteopenia 09/25/2017   Hypertension associated with diabetes ([place]) 09/25/2017   Hyperglycemia 09/25/2017    Hypothyroidism 09/25/2017   CAD (coronary artery disease), native coronary artery 04/29/2017   Nonsustained ventricular tachycardia 04/24/2017   Angina pectoris ([place]) 04/2017   Ischemic cardiomyopathy 04/10/2017   Mixed dyslipidemia 04/10/2017   Abnormal EKG 04/02/2017   Idiopathic peripheral neuropathy 10/11/2016   Proximal leg weakness 10/11/2016   Weakness of both lower extremities 06/11/2016   Loss of height 06/11/2016   De Quervain's tenosynovitis 05/18/2012   Family history of malignant neoplasm of gastrointestinal tract 05/14/2011   NEVI, MULTIPLE 07/12/2010   OSTEOARTHRITIS 07/12/2010   UNSPECIFIED VITAMIN D DEFICIENCY 02/11/2008   HIATAL HERNIA WITH REFLUX 02/11/2008   GASTROENTERITIS 01/20/2008   PERSONAL HISTORY OF COLONIC POLYPS 11/17/2007   DIZZINESS 10/15/2007   PROSTATE CANCER, HX OF 10/15/2007    Orientation RESPIRATION BLADDER Height & Weight     Self, Situation, Time, Place  Normal Continent Weight: 73.5 kg Height:  6' (182.9 cm)  BEHAVIORAL SYMPTOMS/MOOD NEUROLOGICAL BOWEL NUTRITION STATUS      Incontinent Diet (Dysphagia 3)  AMBULATORY STATUS COMMUNICATION OF NEEDS Skin   Extensive Assist Verbally Bruising                       Personal Care Assistance Level of Assistance  Bathing, Feeding, Dressing Bathing Assistance: Limited assistance Feeding assistance: Limited assistance Dressing Assistance: Limited assistance     Functional Limitations Info  Sight, Hearing, Speech Sight Info: Impaired Hearing Info: Impaired Speech Info: Adequate    SPECIAL CARE FACTORS FREQUENCY  PT (By licensed PT), OT (By licensed OT)     PT  Frequency: 5 x weekly OT Frequency: 5 x weekly            Contractures Contractures Info: Not present    Additional Factors Info  Code Status, Allergies Code Status Info: DNR Allergies Info: Penicillins           Current Medications (07/30/2021):  This is the current hospital active medication list Current  Facility-Administered Medications  Medication Dose Route Frequency Provider Last Rate Last Admin   acetaminophen (TYLENOL) tablet 650 mg  650 mg Oral Q6H PRN Lenore Cordia, MD   650 mg at 07/29/21 6203   Or   acetaminophen (TYLENOL) suppository 650 mg  650 mg Rectal Q6H PRN Lenore Cordia, MD       aspirin EC tablet 81 mg  81 mg Oral Q1200 Lenore Cordia, MD   81 mg at 07/29/21 1239   atorvastatin (LIPITOR) tablet 80 mg  80 mg Oral Daily Zada Finders R, MD   80 mg at 07/30/21 1029   carvedilol (COREG) tablet 3.125 mg  3.125 mg Oral BID WC Zada Finders R, MD   3.125 mg at 07/30/21 5597   Chlorhexidine Gluconate Cloth 2 % PADS 6 each  6 each Topical Daily Kristopher Oppenheim, DO   6 each at 07/30/21 1029   enoxaparin (LOVENOX) injection 30 mg  30 mg Subcutaneous Q24H Zada Finders R, MD   30 mg at 07/29/21 2115   insulin aspart (novoLOG) injection 0-9 Units  0-9 Units Subcutaneous TID WC Lenore Cordia, MD   1 Units at 07/30/21 0821   levofloxacin (LEVAQUIN) tablet 500 mg  500 mg Oral q1800 Nita Sells, MD   500 mg at 07/29/21 1708   levothyroxine (SYNTHROID) tablet 100 mcg  100 mcg Oral QAC breakfast Zada Finders R, MD   100 mcg at 07/30/21 0553   ondansetron (ZOFRAN) tablet 4 mg  4 mg Oral Q6H PRN Lenore Cordia, MD       Or   ondansetron (ZOFRAN) injection 4 mg  4 mg Intravenous Q6H PRN Zada Finders R, MD       polyethylene glycol (MIRALAX / GLYCOLAX) packet 17 g  17 g Oral Daily Nita Sells, MD   17 g at 07/29/21 0859   ranolazine (RANEXA) 12 hr tablet 500 mg  500 mg Oral Daily Lenore Cordia, MD   500 mg at 07/30/21 1029     Discharge Medications: Please see discharge summary for a list of discharge medications.  Relevant Imaging Results:  Relevant Lab Results:   Additional Information SS# [id number] Covid vaccinations x 3  Kare Dado, Marjie Skiff, RN

## 2021-07-30 NOTE — Progress Notes (Signed)
Modified Barium Swallow Progress Note  Patient Details  Name: Evan Wright MRN: [id number] Date of Birth: 22-Dec-1924  Today's Date: 07/30/2021  Modified Barium Swallow completed.  Full report located under Chart Review in the Imaging Section.  Brief recommendations include the following:  Clinical Impression  Patient presents with a mild oropharyngeal dysphagia as per this MBS. During oral phase, he exhibited delayed mastication of regular solids, delayed anterior to posterior transit of puree and regular solids as well as delay with barium tablet. He did exhibit premature spillage into vallecular sinus with thin liquid barium and piecemeal swallowing with puree solids. During pharyngeal phase of swallow, patient exhibited swallow initiation delay at level of vallecular sinus aith puree solids and regular solids as well as moderate amount of residuals in vallecular sinus. Sips of thin liquid barium did help to clear majority of puree and regular texture solid residuals. Barium tablet transited pharyngeally without difficulty. Patient exhibited swallow initiation delay at level of vallecular, at times pyriform sinus with thin liquid barium as well as trace to mild vallecular residuals post initial swallow. No instances of penetration or aspiration of any of the tested consistencies observed. Esophageal sweep did not reveal anything significant. (radiologist not present to confirm). SLP is recommending to continue with Dys 3 solids, thin liquids and to take sips of liquids after bites of solids to clear vallecular sinus residuals.   Swallow Evaluation Recommendations       SLP Diet Recommendations: Dysphagia 3 (Mech soft) solids   Liquid Administration via: Cup;Straw   Medication Administration: Whole meds with liquid   Supervision: Patient able to self feed   Compensations: Small sips/bites;Slow rate;Follow solids with liquid   Postural Changes: Seated upright at 90 degrees   Oral Care  Recommendations: Oral care BID;Staff/trained caregiver to provide oral care      Sonia Baller, MA, CCC-SLP Speech Therapy

## 2021-07-30 NOTE — TOC Initial Note (Signed)
Transition of Care [facility]) - Initial/Assessment Note    Patient Details  Name: Evan Wright MRN: [id number] Date of Birth: 19-Jun-1925  [facility] CM/SW Contact:    Lynnell Catalan, RN Phone Number: 07/30/2021, 11:30 AM  Clinical Narrative:                 Spoke with pt at bedside for dc planning. Pt agrees to SNF placement. FL2 faxed out to area SNFs. TOC will provide SNF bed offers when available.  Expected Discharge Plan: Skilled Nursing Facility Barriers to Discharge: Continued Medical Work up   Patient Goals and CMS Choice Patient states their goals for this hospitalization and ongoing recovery are:: To go to rehab      Expected Discharge Plan and Services Expected Discharge Plan: [place]   Discharge Planning Services: CM Consult   Living arrangements for the past 2 months: Single Family Home                     Prior Living Arrangements/Services Living arrangements for the past 2 months: Single Family Home Lives with:: Spouse          Need for Family Participation in Patient Care: Yes (Comment) Care giver support system in place?: Yes (comment)   Criminal Activity/Legal Involvement Pertinent to Current Situation/Hospitalization: No - Comment as needed  Activities of Daily Living Home Assistive Devices/Equipment: None ADL Screening (condition at time of admission) Patient's cognitive ability adequate to safely complete daily activities?: Yes Is the patient deaf or have difficulty hearing?: No Does the patient have difficulty seeing, even when wearing glasses/contacts?: No Does the patient have difficulty concentrating, remembering, or making decisions?: No Patient able to express need for assistance with ADLs?: Yes Does the patient have difficulty dressing or bathing?: Yes Independently performs ADLs?: No Communication: Independent Dressing (OT): Dependent Is this a change from baseline?: Change from baseline, expected to last  <3days Grooming: Needs assistance Is this a change from baseline?: Change from baseline, expected to last <3 days Feeding: Needs assistance Is this a change from baseline?: Change from baseline, expected to last <3 days Bathing: Dependent Is this a change from baseline?: Change from baseline, expected to last <3 days Toileting: Dependent Is this a change from baseline?: Change from baseline, expected to last <3 days In/Out Bed: Dependent Is this a change from baseline?: Change from baseline, expected to last <3 days Walks in Home: Dependent Is this a change from baseline?: Change from baseline, expected to last <3 days Does the patient have difficulty walking or climbing stairs?: Yes Weakness of Legs: Both Weakness of Arms/Hands: Both  Permission Sought/Granted                  Emotional Assessment Appearance:: Appears stated age Attitude/Demeanor/Rapport: Gracious Affect (typically observed): Calm Orientation: : Oriented to Self, Oriented to Place, Oriented to  Time, Oriented to Situation Alcohol / Substance Use: Not Applicable Psych Involvement: No (comment)  Admission diagnosis:  Fecal impaction ([place]) [K56.41] Urine retention [R33.9] 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] PNA (pneumonia) [J18.9] Patient Active Problem List   Diagnosis Date Noted   PNA (pneumonia) 07/27/2021   Fecal impaction ([place]) 07/26/2021   Acute kidney injury superimposed on CKD ([place]) 07/26/2021   Urinary retention 07/26/2021   Frequent falls 07/26/2021   Hypokalemia 07/26/2021   Right lower lobe pneumonia 07/26/2021   Traumatic subdural hemorrhage with loss of consciousness of unspecified duration, initial encounter ([place]) 06/26/2021   Diarrhea 06/26/2021  Moderate aortic regurgitation 03/13/2021   Ascending aortic aneurysm 03/13/2021   Pedal edema 03/13/2021   Adenomatous colon polyp    CAD (coronary artery disease)    Cardiac murmur    Coronary artery disease    Diverticulosis    GERD  (gastroesophageal reflux disease)    History of hiatal hernia    Hyperlipidemia associated with type 2 diabetes mellitus (HCC)    Prostate cancer (HCC)    Osteoarthritis    Intermittent claudication ([place]) 06/02/2020   Slow transit constipation 08/10/2019   SDH (subdural hematoma) 09/28/2018   Seizure-like activity ([place]) 09/28/2018   Right arm weakness 09/28/2018   Preventative health care 09/23/2018   Type 2 diabetes mellitus ([place]) 09/23/2018   Osteopenia 09/25/2017   Hypertension associated with diabetes ([place]) 09/25/2017   Hyperglycemia 09/25/2017   Hypothyroidism 09/25/2017   CAD (coronary artery disease), native coronary artery 04/29/2017   Nonsustained ventricular tachycardia 04/24/2017   Angina pectoris ([place]) 04/2017   Ischemic cardiomyopathy 04/10/2017   Mixed dyslipidemia 04/10/2017   Abnormal EKG 04/02/2017   Idiopathic peripheral neuropathy 10/11/2016   Proximal leg weakness 10/11/2016   Weakness of both lower extremities 06/11/2016   Loss of height 06/11/2016   De Quervain's tenosynovitis 05/18/2012   Family history of malignant neoplasm of gastrointestinal tract 05/14/2011   NEVI, MULTIPLE 07/12/2010   OSTEOARTHRITIS 07/12/2010   UNSPECIFIED VITAMIN D DEFICIENCY 02/11/2008   HIATAL HERNIA WITH REFLUX 02/11/2008   GASTROENTERITIS 01/20/2008   PERSONAL HISTORY OF COLONIC POLYPS 11/17/2007   DIZZINESS 10/15/2007   PROSTATE CANCER, HX OF 10/15/2007   PCP:  [name], DO Pharmacy:   [facility] [place] - [address] [place] [address] [place] [address] Phone: [phone] Fax: [place], [place] - [address] [place] [address] Phone: [phone] Fax: [phone]  [id number] - [place], [address] [place] [address] Phone: [phone] Fax: [phone]  [place] [address], [place] [address] Phone: [phone] Fax: [phone]     Social Determinants of Health (SDOH) Interventions    Readmission Risk Interventions Readmission Risk Prevention Plan 07/30/2021  Transportation Screening Complete  PCP or Specialist Appt within 3-5 Days Complete  HRI or Home Care Consult Complete  Social Work Consult for [place] Planning/Counseling Complete  Palliative Care Screening Not Applicable  Medication Review Press photographer) Complete  Some recent data might be hidden

## 2021-07-31 DIAGNOSIS — Z8546 Personal history of malignant neoplasm of prostate: Secondary | ICD-10-CM | POA: Diagnosis not present

## 2021-07-31 DIAGNOSIS — N200 Calculus of kidney: Secondary | ICD-10-CM | POA: Diagnosis not present

## 2021-07-31 DIAGNOSIS — J690 Pneumonitis due to inhalation of food and vomit: Secondary | ICD-10-CM | POA: Diagnosis not present

## 2021-07-31 DIAGNOSIS — Z741 Need for assistance with personal care: Secondary | ICD-10-CM | POA: Diagnosis not present

## 2021-07-31 DIAGNOSIS — M6281 Muscle weakness (generalized): Secondary | ICD-10-CM | POA: Diagnosis not present

## 2021-07-31 DIAGNOSIS — R5381 Other malaise: Secondary | ICD-10-CM | POA: Diagnosis not present

## 2021-07-31 DIAGNOSIS — K5641 Fecal impaction: Secondary | ICD-10-CM | POA: Diagnosis not present

## 2021-07-31 DIAGNOSIS — R338 Other retention of urine: Secondary | ICD-10-CM | POA: Diagnosis not present

## 2021-07-31 DIAGNOSIS — E039 Hypothyroidism, unspecified: Secondary | ICD-10-CM | POA: Diagnosis not present

## 2021-07-31 DIAGNOSIS — Z466 Encounter for fitting and adjustment of urinary device: Secondary | ICD-10-CM | POA: Diagnosis not present

## 2021-07-31 DIAGNOSIS — E1169 Type 2 diabetes mellitus with other specified complication: Secondary | ICD-10-CM | POA: Diagnosis not present

## 2021-07-31 DIAGNOSIS — R410 Disorientation, unspecified: Secondary | ICD-10-CM | POA: Diagnosis not present

## 2021-07-31 DIAGNOSIS — N1832 Chronic kidney disease, stage 3b: Secondary | ICD-10-CM | POA: Diagnosis not present

## 2021-07-31 DIAGNOSIS — R4182 Altered mental status, unspecified: Secondary | ICD-10-CM | POA: Diagnosis not present

## 2021-07-31 DIAGNOSIS — N390 Urinary tract infection, site not specified: Secondary | ICD-10-CM | POA: Diagnosis not present

## 2021-07-31 DIAGNOSIS — R8271 Bacteriuria: Secondary | ICD-10-CM | POA: Diagnosis not present

## 2021-07-31 DIAGNOSIS — E860 Dehydration: Secondary | ICD-10-CM | POA: Diagnosis not present

## 2021-07-31 DIAGNOSIS — I255 Ischemic cardiomyopathy: Secondary | ICD-10-CM | POA: Diagnosis not present

## 2021-07-31 DIAGNOSIS — J189 Pneumonia, unspecified organism: Secondary | ICD-10-CM | POA: Diagnosis not present

## 2021-07-31 DIAGNOSIS — Z955 Presence of coronary angioplasty implant and graft: Secondary | ICD-10-CM | POA: Diagnosis not present

## 2021-07-31 DIAGNOSIS — I2510 Atherosclerotic heart disease of native coronary artery without angina pectoris: Secondary | ICD-10-CM | POA: Diagnosis not present

## 2021-07-31 DIAGNOSIS — N189 Chronic kidney disease, unspecified: Secondary | ICD-10-CM | POA: Diagnosis not present

## 2021-07-31 DIAGNOSIS — D649 Anemia, unspecified: Secondary | ICD-10-CM | POA: Diagnosis not present

## 2021-07-31 DIAGNOSIS — I129 Hypertensive chronic kidney disease with stage 1 through stage 4 chronic kidney disease, or unspecified chronic kidney disease: Secondary | ICD-10-CM | POA: Diagnosis not present

## 2021-07-31 DIAGNOSIS — R339 Retention of urine, unspecified: Secondary | ICD-10-CM | POA: Diagnosis not present

## 2021-07-31 DIAGNOSIS — E785 Hyperlipidemia, unspecified: Secondary | ICD-10-CM | POA: Diagnosis not present

## 2021-07-31 DIAGNOSIS — R41841 Cognitive communication deficit: Secondary | ICD-10-CM | POA: Diagnosis not present

## 2021-07-31 DIAGNOSIS — N179 Acute kidney failure, unspecified: Secondary | ICD-10-CM | POA: Diagnosis not present

## 2021-07-31 DIAGNOSIS — M542 Cervicalgia: Secondary | ICD-10-CM | POA: Diagnosis not present

## 2021-07-31 DIAGNOSIS — R1312 Dysphagia, oropharyngeal phase: Secondary | ICD-10-CM | POA: Diagnosis not present

## 2021-07-31 DIAGNOSIS — Z9181 History of falling: Secondary | ICD-10-CM | POA: Diagnosis not present

## 2021-07-31 LAB — BASIC METABOLIC PANEL
Anion gap: 8 (ref 5–15)
BUN: 19 mg/dL (ref 8–23)
CO2: 16 mmol/L — ABNORMAL LOW (ref 22–32)
Calcium: 8.4 mg/dL — ABNORMAL LOW (ref 8.9–10.3)
Chloride: 116 mmol/L — ABNORMAL HIGH (ref 98–111)
Creatinine, Ser: 1.65 mg/dL — ABNORMAL HIGH (ref 0.61–1.24)
GFR, Estimated: 38 mL/min — ABNORMAL LOW (ref >60)
Glucose, Bld: 129 mg/dL — ABNORMAL HIGH (ref 70–99)
Potassium: 3.4 mmol/L — ABNORMAL LOW (ref 3.5–5.1)
Sodium: 140 mmol/L (ref 135–145)

## 2021-07-31 LAB — CBC WITH DIFFERENTIAL/PLATELET
Abs Immature Granulocytes: 0.12 10*3/uL — ABNORMAL HIGH (ref 0.00–0.07)
Basophils Absolute: 0 10*3/uL (ref 0.0–0.1)
Basophils Relative: 0 %
Eosinophils Absolute: 0.1 10*3/uL (ref 0.0–0.5)
Eosinophils Relative: 1 %
HCT: 37.1 % — ABNORMAL LOW (ref 39.0–52.0)
Hemoglobin: 11.8 g/dL — ABNORMAL LOW (ref 13.0–17.0)
Immature Granulocytes: 1 %
Lymphocytes Relative: 6 %
Lymphs Abs: 0.6 10*3/uL — ABNORMAL LOW (ref 0.7–4.0)
MCH: 30.3 pg (ref 26.0–34.0)
MCHC: 31.8 g/dL (ref 30.0–36.0)
MCV: 95.4 fL (ref 80.0–100.0)
Monocytes Absolute: 0.6 10*3/uL (ref 0.1–1.0)
Monocytes Relative: 6 %
Neutro Abs: 8.3 10*3/uL — ABNORMAL HIGH (ref 1.7–7.7)
Neutrophils Relative %: 86 %
Platelets: 235 10*3/uL (ref 150–400)
RBC: 3.89 MIL/uL — ABNORMAL LOW (ref 4.22–5.81)
RDW: 12.9 % (ref 11.5–15.5)
WBC: 9.8 10*3/uL (ref 4.0–10.5)
nRBC: 0 % (ref 0.0–0.2)

## 2021-07-31 LAB — GLUCOSE, CAPILLARY
Glucose-Capillary: 113 mg/dL — ABNORMAL HIGH (ref 70–99)
Glucose-Capillary: 150 mg/dL — ABNORMAL HIGH (ref 70–99)
Glucose-Capillary: 131 mg/dL — ABNORMAL HIGH (ref 70–99)

## 2021-07-31 LAB — RESP PANEL BY RT-PCR (FLU A&B, COVID) ARPGX2
Influenza A by PCR: NEGATIVE
Influenza B by PCR: NEGATIVE
SARS Coronavirus 2 by RT PCR: NEGATIVE

## 2021-07-31 MED ORDER — LEVOFLOXACIN 500 MG PO TABS: 500.0000 mg | ORAL_TABLET | ORAL | 0 refills | Status: DC

## 2021-07-31 NOTE — Discharge Summary (Signed)
Physician Discharge Summary  Evan Wright WGY:[id number] DOB: [birth year] DOA: 07/25/2021  PCP: [name], DO  Admit date: 07/25/2021 Discharge date: 07/31/2021  Time spent: 33 minutes  Recommendations for Outpatient Follow-up:  Recommend CBC Chem-12 in 1 week Requires aspiration precautions--please see diet orders Please get x-ray in about 1 month recommend Recommend completion of Levaquin on 1/11 which would be 7 days total of antibiotics Patient requires specialized bowel regimen was quite impacted on admission and would benefit from discussion of this in the outpatient setting and it may be every other day MiraLAX Patient will require Foley catheter voiding trial in the outpatient setting in about 1 to 2 weeks at facility and or referral to urology for removal of Foley and will discharge with Foley catheter Consider either initiation of new medication for diabetes or just watchful waiting and only monitoring of sugars given his advanced age-metformin was discontinued this admission    Discharge Diagnoses:  MAIN problem for hospitalization  Aspiration pneumonia Fecal impaction on admission Acute urinary retention  Please see below for itemized issues addressed in [facility]- refer to other progress notes for clarity if needed  Discharge Condition: Improved  Diet recommendation: Dys 3 solids, thin liquids and to take sips of liquids after bites of solids   Filed Weights   07/25/21 2330 07/26/21 1804 07/29/21 0500  Weight: 70.7 kg 68.6 kg 73.5 kg    History of present illness:   88 year old white male community dwelling CAD status post PCI 2019 with ischemic cardiomyopathy now currently on only aspirin, HTN HLD Subdural hematoma 09/2018 Onychogryphosis Mild to moderate aortic regurgitation, ascending aortic aneurysm not wishing intervention 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 07/25/2021 with frequent falls-no LOC however had  generalized fatigue in the setting of multiple loose BMs recently     Work-up including CT head abdomen etc. showed mainly fecal impaction and distended bladder Patient apparently disimpacted in the ED Foley placed because of retention On CT however there was also findings suggestive of aspiration-patient was started on broad-spectrum antibiotics ceftriaxone and azithromycin KUB was obtained with regards to fecal impaction For AKI patient was started on fluids Potassium was repleted   Patient developed unfortunately some delirium on 1/9  Hospital Course:  Hospital-acquired delirium Reorient as best possible This is resolved on discharge Possible aspiration Pneumonia Found on CT scan Ceftriaxone azithromycin converted to, Levaquin stop date = 07/31/2021  SLP input appreciated-they are recommending dysphagia 3 diet Multiple frequent falls Patient lives with wife at home--therapy recs SNF Orthostatics pending No arrhythmia was noted on admission Fecal impaction urinary retention Both likely related--he was manually disimpacted in the ED Repeat KUB 1/8 shows that the bowel was cleaned out Stop all laxatives as frequent stools q. hourly --- will need  re-visit of this Failed Voiding trial 1/6-Will need outpatient urology follow-up for urinary catheter management Hypokalemia on admission 2.9 Mild metabolic acidosis AKI's / CKD 3B of 30/2.0 IV fluids DC 1/8  Outpatient labs CAD status post PCI 2019, ischemic cardiomyopathy Continue Coreg 3.125 cautiously, continue Ranexa 500 daily, continue aspirin 81 daily DM TY 2 Recent A1c 7.1 Taken off metformin recently 2/2 GI issues Will likely require start low dose alternate prior to d/c or in OP May be reasonable given his advanced age to discontinue all glycemic control   Discharge Exam: Vitals:   07/30/21 2133 07/31/21 0536  BP: (!) 153/73 (!) 174/84  Pulse: 63 70  Resp: 18  16  Temp: 98.2 F (36.8 C) 98 F (36.7 C)  SpO2: 95%  95%    Subj on day of d/c   Awake completely coherent today not confused no pain no fever no chills he is concerned about his mobility and tells me he cannot walk  He has no chest pain or fever but does complain of coughing with eating  General Exam on discharge  EOMI NCAT no focal deficit Chest is clear no rales rhonchi Abdomen is soft He has some bruising to his lower extremities but no swelling Neurologically is intact able to move all 4 limbs equally  Discharge Instructions   Discharge Instructions     Diet - low sodium heart healthy   Complete by: As directed    Increase activity slowly   Complete by: As directed    No wound care   Complete by: As directed       Allergies as of 07/31/2021       Reactions   Penicillins Other (See Comments)   Unknown reaction Did it involve swelling of the face/tongue/throat, SOB, or low BP? Unknown Did it involve sudden or severe rash/hives, skin peeling, or any reaction on the inside of your mouth or nose? Unknown Did you need to seek medical attention at a hospital or doctor's office? Unknown When did it last happen?    1940   If all above answers are NO, may proceed with cephalosporin use.        Medication List     TAKE these medications    acetaminophen 500 MG tablet Commonly known as: TYLENOL Take 1,000 mg by mouth every 6 (six) hours as needed for mild pain.   alendronate 70 MG tablet Commonly known as: FOSAMAX Take 1 tablet (70 mg total) by mouth every 7 (seven) days. Take with a full glass of water on an empty stomach.   aspirin EC 81 MG tablet Take 1 tablet (81 mg total) by mouth daily at 12 noon.   atorvastatin 80 MG tablet Commonly known as: LIPITOR TAKE ONE TABLET (80MG  TOTAL) BY MOUTH DAILY What changed: See the new instructions.   CALCIUM+D3 PO Take 1 tablet by mouth daily at 12 noon.   carboxymethylcellulose 0.5 % Soln Commonly known as: REFRESH PLUS 1 drop daily as needed (dry eyes).    carvedilol 3.125 MG tablet Commonly known as: COREG TAKE ONE TABLET (3.125MG  TOTAL) BY MOUTHTWICE DAILY WITH A MEAL What changed: See the new instructions.   ezetimibe 10 MG tablet Commonly known as: ZETIA TAKE ONE TABLET (10MG  TOTAL) BY MOUTH DAILY   lansoprazole 30 MG capsule Commonly known as: PREVACID Take 1 capsule (30 mg total) by mouth 2 (two) times daily before a meal.   levofloxacin 500 MG tablet Commonly known as: LEVAQUIN Take 1 tablet (500 mg total) by mouth every other day.   levothyroxine 100 MCG tablet Commonly known as: Synthroid Take 1 tablet (100 mcg total) by mouth daily before breakfast. Pt due for office visit   nitroGLYCERIN 0.4 MG SL tablet Commonly known as: NITROSTAT Place 0.4 mg under the tongue every 5 (five) minutes as needed for chest pain.   PreserVision AREDS 2 Caps Take 1 capsule by mouth daily at 12 noon.   ranolazine 500 MG 12 hr tablet Commonly known as: RANEXA TAKE ONE TABLET (500 MG TOTAL) BY MOUTH ONCE DAILY What changed: See the new instructions.   Vitamin D (Ergocalciferol) 1.25 MG (50000 UNIT) Caps capsule Commonly known as: DRISDOL TAKE  ONE CAPSULE (50000 UNITS TOTAL) BY MOUTH EVERY 7 DAYS What changed: See the new instructions.       Allergies  Allergen Reactions   Penicillins Other (See Comments)    Unknown reaction Did it involve swelling of the face/tongue/throat, SOB, or low BP? Unknown Did it involve sudden or severe rash/hives, skin peeling, or any reaction on the inside of your mouth or nose? Unknown Did you need to seek medical attention at a hospital or doctor's office? Unknown When did it last happen?    1940   If all above answers are NO, may proceed with cephalosporin use.      The results of significant diagnostics from this hospitalization (including imaging, microbiology, ancillary and laboratory) are listed below for reference.    Significant Diagnostic Studies: DG Chest 2 View  Result Date:  07/27/2021 CLINICAL DATA:  Pneumonia EXAM: CHEST - 2 VIEW COMPARISON:  Chest radiograph dated September 28, 2018 FINDINGS: The heart size and mediastinal contours are within normal limits. Aorta is tortuous with atherosclerotic calcifications. Both lungs are clear without evidence of focal consolidation or pleural effusion. Osteopenia with degenerative disc disease of the thoracic spine. IMPRESSION: No active cardiopulmonary disease. Electronically Signed   By: Keane Police D.O.   On: 07/27/2021 11:54   DG Abd 1 View  Result Date: 07/29/2021 CLINICAL DATA:  Fecal impaction and urinary retention. EXAM: ABDOMEN - 1 VIEW COMPARISON:  07/27/2021. FINDINGS: Normal bowel gas pattern. No significant increase in colonic stool burden. Rectum mildly distended with stool, decreased compared to the prior exam. IMPRESSION: 1. Interval decrease in rectal stool, rectum now only mildly distended with stool. 2. No significant increase in colonic stool burden, also decreased from the previous exam. Electronically Signed   By: Lajean Manes M.D.   On: 07/29/2021 08:33   Abd 1 View (KUB)  Result Date: 07/27/2021 CLINICAL DATA:  Constipation EXAM: ABDOMEN - 1 VIEW COMPARISON:  None. FINDINGS: Nonobstructive bowel gas pattern. Mild amount of retained fecal material in the colon. Retained fecal material in the rectum also seen. No suspicious calcifications visualized. IMPRESSION: Retained fecal material mostly in the rectum. Electronically Signed   By: Ofilia Neas M.D.   On: 07/27/2021 08:19   CT Head Wo Contrast  Result Date: 07/26/2021 CLINICAL DATA:  Neck trauma (Age >= 65y); Head trauma, minor (Age >= 65y). Fall, now with gait abnormality and lower extremity weakness EXAM: CT HEAD WITHOUT CONTRAST CT CERVICAL SPINE WITHOUT CONTRAST TECHNIQUE: Multidetector CT imaging of the head and cervical spine was performed following the standard protocol without intravenous contrast. Multiplanar CT image reconstructions of the cervical  spine were also generated. COMPARISON:  None. FINDINGS: CT HEAD FINDINGS Brain: Mild parenchymal volume loss is commensurate with the patient's age. Mild periventricular white matter changes are present likely reflecting the sequela of small vessel ischemia. Hypoattenuation within the a left subinsular and periventricular white matter adjacent to the anterior horn of the left lateral ventricle is unchanged most in keeping with a remote white matter infarct. No evidence of acute intracranial hemorrhage or infarct. 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 No intra or extra-axial fluid collection. Ventricular size is borderline enlarged, likely the sequela of central atrophy. The cerebellum is unremarkable. Vascular: No hyperdense vessel or unexpected calcification. Skull: Normal. Negative for fracture or focal lesion. Sinuses/Orbits: Paranasal sinuses are clear. Ocular lenses have been removed. Orbits are otherwise unremarkable. Other:  Mastoid air cells and middle ear cavities are clear. CT CERVICAL SPINE FINDINGS Alignment: Normal. Skull base and vertebrae: No acute fracture. No primary bone lesion or focal pathologic process. Soft tissues and spinal canal: No prevertebral fluid or swelling. No visible canal hematoma. Disc levels: There is diffuse intervertebral disc space narrowing and endplate remodeling throughout the cervical spine in keeping with changes of diffuse degenerative disc disease. Prevertebral soft tissues are not thickened. Spinal canal demonstrates mild stenosis secondary to a posterior disc osteophyte complex at C5-6 which mildly flattens the thecal sac. AP diameter of the spinal canal in this region is 7-8 mm. Similar changes are seen at C6-7. Multilevel uncovertebral and facet arthrosis results in multilevel mild-to-moderate neuroforaminal narrowing, most severe at C3-4 and C6-7 on the left  and C4-5 on the right. Upper chest: Unremarkable Other: None IMPRESSION: No acute intracranial injury.  No calvarial fracture. No acute fracture or listhesis of the cervical spine. Electronically Signed   By: Fidela Salisbury M.D.   On: 07/26/2021 01:49   CT Cervical Spine Wo Contrast  Result Date: 07/26/2021 CLINICAL DATA:  Neck trauma (Age >= 65y); Head trauma, minor (Age >= 65y). Fall, now with gait abnormality and lower extremity weakness EXAM: CT HEAD WITHOUT CONTRAST CT CERVICAL SPINE WITHOUT CONTRAST TECHNIQUE: Multidetector CT imaging of the head and cervical spine was performed following the standard protocol without intravenous contrast. Multiplanar CT image reconstructions of the cervical spine were also generated. COMPARISON:  None. FINDINGS: CT HEAD FINDINGS Brain: Mild parenchymal volume loss is commensurate with the patient's age. Mild periventricular white matter changes are present likely reflecting the sequela of small vessel ischemia. Hypoattenuation within the a left subinsular and periventricular white matter adjacent to the anterior horn of the left lateral ventricle is unchanged most in keeping with a remote white matter infarct. No evidence of acute intracranial hemorrhage or infarct. 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 No intra or extra-axial fluid collection. Ventricular size is borderline enlarged, likely the sequela of central atrophy. The cerebellum is unremarkable. Vascular: No hyperdense vessel or unexpected calcification. Skull: Normal. Negative for fracture or focal lesion. Sinuses/Orbits: Paranasal sinuses are clear. Ocular lenses have been removed. Orbits are otherwise unremarkable. Other: Mastoid air cells and middle ear cavities are clear. CT CERVICAL SPINE FINDINGS Alignment: Normal. Skull base and vertebrae: No acute fracture. No primary bone lesion or focal  pathologic process. Soft tissues and spinal canal: No prevertebral fluid or swelling. No visible canal hematoma. Disc levels: There is diffuse intervertebral disc space narrowing and endplate remodeling throughout the cervical spine in keeping with changes of diffuse degenerative disc disease. Prevertebral soft tissues are not thickened. Spinal canal demonstrates mild stenosis secondary to a posterior disc osteophyte complex at C5-6 which mildly flattens the thecal sac. AP diameter of the spinal canal in this region is 7-8 mm. Similar changes are seen at C6-7. Multilevel uncovertebral and facet arthrosis results in multilevel mild-to-moderate neuroforaminal narrowing, most severe at C3-4 and C6-7 on the left and C4-5 on the right. Upper chest: Unremarkable Other: None IMPRESSION: No acute intracranial injury.  No calvarial fracture. No acute fracture or listhesis of the cervical spine. Electronically Signed   By: Fidela Salisbury M.D.   On: 07/26/2021 01:49   DG Swallowing Func-Speech Pathology  Result Date: 07/30/2021 Table formatting from the original result was not included. Objective Swallowing Evaluation: Type of Study: MBS-Modified Barium Swallow  Study  Patient Details Name: DOYAL SARIC MRN: [id number] Date of Birth: 02-02-1925 Today's Date: 07/30/2021 Time: SLP Start Time (ACUTE ONLY): 1205 -SLP Stop Time (ACUTE ONLY): 4268 SLP Time Calculation (min) (ACUTE ONLY): 25 min Past Medical History: Past Medical History: Diagnosis Date  Abnormal EKG 04/02/2017  Adenomatous colon polyp   Angina pectoris ([place]) 04/2017  CAD (coronary artery disease)   a. Cath 04/29/17-> high grade pLAD with R to L collaterals, high grade 1st dig  2. Cath 05/01/17 s/p ostial to mLAD with DES x 2.   CAD (coronary artery disease), native coronary artery 04/29/2017  Cardiac murmur   Coronary artery disease   De Quervain's tenosynovitis 05/18/2012  Diverticulosis   DIZZINESS 10/15/2007  Qualifier: Diagnosis of  By: [name]    Essential  hypertension 09/25/2017  Family history of malignant neoplasm of gastrointestinal tract 05/14/2011  Brother with colon cancer   GASTROENTERITIS 01/20/2008  Qualifier: Diagnosis of  By: [name], [name]    GERD (gastroesophageal reflux disease)   HIATAL HERNIA WITH REFLUX 02/11/2008  Qualifier: Diagnosis of  By: [name]    History of hiatal hernia   Hyperglycemia 09/25/2017  Hyperlipidemia   Hypothyroidism 09/25/2017  Idiopathic peripheral neuropathy 10/11/2016  Intermittent claudication ([place]) 06/02/2020  Ischemic cardiomyopathy 04/10/2017  Loss of height 06/11/2016  Mixed dyslipidemia 04/10/2017  NEVI, MULTIPLE 07/12/2010  Qualifier: Diagnosis of  By: [name]    Nonsustained ventricular tachycardia 04/24/2017  Osteoarthritis   OSTEOARTHRITIS 07/12/2010  Qualifier: Diagnosis of  By: [name]    Osteopenia 09/25/2017  Personal history of colonic polyps 11/17/2007  Qualifier: Diagnosis of  By: [name], [name]   Preventative health care 09/23/2018  Prostate cancer [facility])   PROSTATE CANCER, HX OF 10/15/2007  Qualifier: Diagnosis of  By: [name]    Proximal leg weakness 10/11/2016  Right arm weakness 09/28/2018  SDH (subdural hematoma) 09/28/2018  Seizure-like activity ([place]) 09/28/2018  Slow transit constipation 08/10/2019  Uncontrolled diabetes mellitus with hyperglycemia ([place]) 09/23/2018  Unspecified vitamin D deficiency 02/11/2008  Qualifier: Diagnosis of  By: [name]    Weakness of both lower extremities 06/11/2016 Past Surgical History: Past Surgical History: Procedure Laterality Date  APPENDECTOMY  1948  COLONOSCOPY    CORONARY STENT INTERVENTION N/A 05/01/2017  Procedure: CORONARY STENT INTERVENTION;  Surgeon: [name], [name], MD;  Location: [facility];  Service: Cardiovascular;  Laterality: N/A;  EYE SURGERY  2012  march and April  --Dr [name]  LEFT HEART CATH AND CORONARY ANGIOGRAPHY N/A 04/29/2017  Procedure: LEFT HEART CATH AND CORONARY ANGIOGRAPHY;  Surgeon: [name], MD;   Location: [facility];  Service: Cardiovascular;  Laterality: N/A;  PROSTATECTOMY    TONSILLECTOMY   HPI: BEUFORD GARCILAZO is a 88 y.o. male with medical history significant for CAD s/p DES, T2DM with peripheral neuropathy, CKD stage III, HTN, HLD, hypothyroidism, constipation, frequent falls who presented to the ED for evaluation after a fall at home. Incidental findings of RLL opacity on CT. Pt does confirm dysphagia to both solids and liquids with h/o esophageal dilations.  Subjective: pleasant, very dysarthric and difficult to understand  Recommendations for follow up therapy are one component of a multi-disciplinary discharge planning process, led by the attending physician.  Recommendations may be updated based on patient status, additional functional criteria and insurance authorization. Assessment / Plan / Recommendation Clinical Impressions 07/30/2021 Clinical Impression Patient presents with a mild oropharyngeal dysphagia as per  this MBS. During oral phase, he exhibited delayed mastication of regular solids, delayed anterior to posterior transit of puree and regular solids as well as delay with barium tablet. He did exhibit premature spillage into vallecular sinus with thin liquid barium and piecemeal swallowing with puree solids. During pharyngeal phase of swallow, patient exhibited swallow initiation delay at level of vallecular sinus aith puree solids and regular solids as well as moderate amount of residuals in vallecular sinus. Sips of thin liquid barium did help to clear majority of puree and regular texture solid residuals. Barium tablet transited pharyngeally without difficulty. Patient exhibited swallow initiation delay at level of vallecular, at times pyriform sinus with thin liquid barium as well as trace to mild vallecular residuals post initial swallow. No instances of penetration or aspiration of any of the tested consistencies observed. Esophageal sweep did not reveal anything significant.  (radiologist not present to confirm). SLP is recommending to continue with Dys 3 solids, thin liquids and to take sips of liquids after bites of solids to clear vallecular sinus residuals. SLP Visit Diagnosis Dysphagia, oropharyngeal phase (R13.12) Attention and concentration deficit following -- Frontal lobe and executive function deficit following -- Impact on safety and function Mild aspiration risk   Treatment Recommendations 07/30/2021 Treatment Recommendations Therapy as outlined in treatment plan below   Prognosis 07/30/2021 Prognosis for Safe Diet Advancement Good Barriers to Reach Goals -- Barriers/Prognosis Comment -- Diet Recommendations 07/30/2021 SLP Diet Recommendations Dysphagia 3 (Mech soft) solids Liquid Administration via Cup;Straw Medication Administration Whole meds with liquid Compensations Small sips/bites;Slow rate;Follow solids with liquid Postural Changes Seated upright at 90 degrees   Other Recommendations 07/30/2021 Recommended Consults -- Oral Care Recommendations Oral care BID;Staff/trained caregiver to provide oral care Other Recommendations -- Follow Up Recommendations Skilled nursing-short term rehab (<3 hours/day) Assistance recommended at discharge Intermittent Supervision/Assistance Functional Status Assessment Patient has had a recent decline in their functional status and demonstrates the ability to make significant improvements in function in a reasonable and predictable amount of time. Frequency and Duration  07/30/2021 Speech Therapy Frequency (ACUTE ONLY) min 1 x/week Treatment Duration 1 week   Oral Phase 07/30/2021 Oral Phase Impaired Oral - Pudding Teaspoon -- Oral - Pudding Cup -- Oral - Honey Teaspoon -- Oral - Honey Cup -- Oral - Nectar Teaspoon -- Oral - Nectar Cup -- Oral - Nectar Straw -- Oral - Thin Teaspoon -- Oral - Thin Cup Premature spillage;Decreased bolus cohesion;Reduced posterior propulsion Oral - Thin Straw -- Oral - Puree Reduced posterior propulsion;Piecemeal  swallowing;Delayed oral transit Oral - Mech Soft -- Oral - Regular Reduced posterior propulsion;Delayed oral transit;Weak lingual manipulation;Impaired mastication Oral - Multi-Consistency -- Oral - Pill Reduced posterior propulsion;Delayed oral transit Oral Phase - Comment --  Pharyngeal Phase 07/30/2021 Pharyngeal Phase Impaired Pharyngeal- Pudding Teaspoon -- Pharyngeal -- Pharyngeal- Pudding Cup -- Pharyngeal -- Pharyngeal- Honey Teaspoon -- Pharyngeal -- Pharyngeal- Honey Cup -- Pharyngeal -- Pharyngeal- Nectar Teaspoon -- Pharyngeal -- Pharyngeal- Nectar Cup -- Pharyngeal -- Pharyngeal- Nectar Straw -- Pharyngeal -- Pharyngeal- Thin Teaspoon -- Pharyngeal -- Pharyngeal- Thin Cup Delayed swallow initiation-vallecula;Delayed swallow initiation-pyriform sinuses;Pharyngeal residue - valleculae Pharyngeal -- Pharyngeal- Thin Straw -- Pharyngeal -- Pharyngeal- Puree Delayed swallow initiation-vallecula;Pharyngeal residue - valleculae Pharyngeal -- Pharyngeal- Mechanical Soft -- Pharyngeal -- Pharyngeal- Regular Delayed swallow initiation-vallecula;Pharyngeal residue - valleculae Pharyngeal -- Pharyngeal- Multi-consistency -- Pharyngeal -- Pharyngeal- Pill WFL Pharyngeal -- Pharyngeal Comment --  Cervical Esophageal Phase  07/30/2021 Cervical Esophageal Phase WFL Pudding Teaspoon -- Pudding Cup -- Honey Teaspoon --  Honey Cup -- Nectar Teaspoon -- Nectar Cup -- Nectar Straw -- Thin Teaspoon -- Thin Cup -- Thin Straw -- Puree -- Mechanical Soft -- Regular -- Multi-consistency -- Pill -- Cervical Esophageal Comment -- Sonia Baller, MA, CCC-SLP Speech Therapy                     CT Renal Stone Study  Result Date: 07/26/2021 CLINICAL DATA:  Urinary retention EXAM: CT ABDOMEN AND PELVIS WITHOUT CONTRAST TECHNIQUE: Multidetector CT imaging of the abdomen and pelvis was performed following the standard protocol without IV contrast. COMPARISON:  None. FINDINGS: Lower chest: There is multifocal consolidation of the right  lung base which may relate to infection or aspiration in the acute setting. Small right pleural effusion is present. Extensive coronary artery calcification. Global cardiac size within normal limits. Small hiatal hernia. Hepatobiliary: No focal liver abnormality is seen. No gallstones, gallbladder wall thickening, or biliary dilatation. Pancreas: Unremarkable Spleen: Unremarkable Adrenals/Urinary Tract: The adrenal glands are unremarkable. The kidneys are normal in size and position. 5 mm nonobstructing calculus noted within the upper pole the left kidney. No ureteral calculi. No hydronephrosis. Foley catheter balloon is seen within a decompressed bladder lumen. Stomach/Bowel: There is large volume stool seen within the rectal vault with circumferential rectal wall thickening and mild perirectal inflammatory stranding suggesting changes of fecal impaction and superimposed stercoral proctitis. The stomach, small bowel, and large bowel are otherwise unremarkable. Appendix absent. Vascular/Lymphatic: Aortic atherosclerosis. No enlarged abdominal or pelvic lymph nodes. Reproductive: Status post prostatectomy. Other: No abdominal wall hernia. Musculoskeletal: No acute bone abnormality. Osseous structures are age-appropriate. IMPRESSION: Multifocal consolidation within the right lung base, infection versus aspiration. Small associated right pleural effusion. Large volume stool within the rectal vault in keeping with probable fecal impaction. 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 Mild left nonobstructing nephrolithiasis. No hydronephrosis. Complete decompression of the bladder with Foley catheter balloon in place. Aortic Atherosclerosis (ICD10-I70.0). Electronically Signed   By: Fidela Salisbury M.D.   On: 07/26/2021 01:55    Microbiology: Recent Results (from the past 240 hour(s))  Resp Panel by RT-PCR (Flu A&B, Covid) Nasopharyngeal Swab     Status: None   Collection Time: 07/26/21  1:33 AM   Specimen:  Nasopharyngeal Swab; Nasopharyngeal(NP) swabs in vial transport medium  Result Value Ref Range Status   SARS Coronavirus 2 by RT PCR NEGATIVE NEGATIVE Final    Comment: (NOTE) SARS-CoV-2 target nucleic acids are NOT DETECTED.  The SARS-CoV-2 RNA is generally detectable in upper respiratory specimens during the acute phase of infection. The lowest concentration of SARS-CoV-2 viral copies this assay can detect is 138 copies/mL. A negative result does not preclude SARS-Cov-2 infection and should not be used as the sole basis for treatment or other patient management decisions. A negative result may occur with  improper specimen collection/handling, submission of specimen other than nasopharyngeal swab, presence of viral mutation(s) within the areas targeted by this assay, and inadequate number of viral copies(<138 copies/mL). A negative result must be combined with clinical observations, patient history, and epidemiological information. The expected result is Negative.  Fact Sheet for Patients:  EntrepreneurPulse.com.au  Fact Sheet for Healthcare Providers:  IncredibleEmployment.be  This test is no t yet approved or cleared by the [place] FDA and  has been authorized for detection and/or diagnosis of SARS-CoV-2 by FDA under an Emergency Use Authorization (EUA). This EUA will remain  in effect (meaning this test can be used) for the duration of the COVID-19 declaration under  Section 564(b)(1) of the Act, 21 U.S.C.section 360bbb-3(b)(1), unless the authorization is terminated  or revoked sooner.       Influenza A by PCR NEGATIVE NEGATIVE Final   Influenza B by PCR NEGATIVE NEGATIVE Final    Comment: (NOTE) The Xpert Xpress SARS-CoV-2/FLU/RSV plus assay is intended as an aid in the diagnosis of influenza from Nasopharyngeal swab specimens and should not be used as a sole basis for treatment. Nasal washings and aspirates are unacceptable for  Xpert Xpress SARS-CoV-2/FLU/RSV testing.  Fact Sheet for Patients: EntrepreneurPulse.com.au  Fact Sheet for Healthcare Providers: IncredibleEmployment.be  This test is not yet approved or cleared by the [place] FDA and has been authorized for detection and/or diagnosis of SARS-CoV-2 by FDA under an Emergency Use Authorization (EUA). This EUA will remain in effect (meaning this test can be used) for the duration of the COVID-19 declaration under Section 564(b)(1) of the Act, 21 U.S.C. section 360bbb-3(b)(1), unless the authorization is terminated or revoked.  Performed at [facility], [address] [place], [place] [address]      Labs: Basic Metabolic Panel: Recent Labs  Lab 07/26/21 1903 07/27/21 0535 07/28/21 0615 07/29/21 0609 07/30/21 1416 07/31/21 0449  NA 143 144 143 143 140 140  K 3.3* 3.5 5.1 4.1 3.8 3.4*  CL 112* 116* 114* 116* 119* 116*  CO2 21* 18* 19* 17* 16* 16*  GLUCOSE 157* 143* 143* 140* 161* 129*  BUN 34* 35* 31* 24* 20 19  CREATININE 1.78* 1.74* 1.59* 1.43* 1.64* 1.65*  CALCIUM 8.7* 8.1* 8.3* 8.2* 8.1* 8.4*  MG 1.7 1.6*  --   --   --   --    Liver Function Tests: Recent Labs  Lab 07/26/21 0017 07/28/21 0615 07/30/21 1416  AST 19 34 18  ALT 12 27 21   ALKPHOS 55 56 62  BILITOT 1.2 0.6 0.6  PROT 6.4* 5.9* 5.7*  ALBUMIN 2.8* 2.6* 2.5*   No results for input(s): LIPASE, AMYLASE in the last 168 hours. No results for input(s): AMMONIA in the last 168 hours. CBC: Recent Labs  Lab 07/26/21 0017 07/26/21 1903 07/27/21 0535 07/28/21 0615 07/29/21 0609 07/31/21 0449  WBC 20.4* 17.1* 13.1* 10.2 9.1 9.8  NEUTROABS 18.3*  --   --  9.2* 8.0* 8.3*  HGB 12.2* 12.3* 11.0* 11.7* 11.4* 11.8*  HCT 35.4* 38.0* 33.7* 37.4* 36.6* 37.1*  MCV 91.0 95.5 94.9 96.6 97.3 95.4  PLT 251 225 206 223 227 235   Cardiac Enzymes: No results for input(s): CKTOTAL, CKMB, CKMBINDEX, TROPONINI in the last 168  hours. BNP: BNP (last 3 results) No results for input(s): BNP in the last 8760 hours.  ProBNP (last 3 results) No results for input(s): PROBNP in the last 8760 hours.  CBG: Recent Labs  Lab 07/30/21 0733 07/30/21 1137 07/30/21 1628 07/30/21 2134 07/31/21 0753  GLUCAP 128* 153* 140* 154* 113*       Signed:  Nita Sells MD   [facility] 07/31/2021, 9:23 AM

## 2021-07-31 NOTE — Progress Notes (Signed)
Call [facility] to give report to [place]. Called daughter to inform of discharge.

## 2021-07-31 NOTE — TOC Progression Note (Addendum)
Transition of Care [facility]) - Progression Note    Patient Details  Name: Evan Wright MRN: [id number] Date of Birth: 12/01/1924  [facility] of [facility]) CM/SW Contact  Noland Pizano, Marjie Skiff, RN Phone Number: 07/31/2021, 1:52 PM  Clinical Narrative:    Spoke with daughter Malachy Mood at 11am for SNF bed choice. She called me back at 11:30 to alert me of their choice of [facility]). [facility] liaison confirmed bed availability for today. Insurance auth started with [employer].  1345: [place] requested additional PT notes. New physical therapy notes faxed to [facility]. Awaiting auth for transport to [facility].  1456: Auth approved by [facility] 1/10-1/12 [id number]. [facility] contacted for transport. Pt to go to room 38 at [facility]. RN to call report to [phone].  Expected Discharge Plan: Skilled Nursing Facility Barriers to Discharge: Continued Medical Work up  Expected Discharge Plan and Services Expected Discharge Plan: [place]   Discharge Planning Services: CM Consult   Living arrangements for the past 2 months: Single Family Home Expected Discharge Date: 07/31/21                                     Social Determinants of Health (SDOH) Interventions    Readmission Risk Interventions Readmission Risk Prevention Plan 07/30/2021  Transportation Screening Complete  PCP or Specialist Appt within 3-5 Days Complete  HRI or [place] Complete  Social Work Consult for [place] Planning/Counseling Complete  Palliative Care Screening Not Applicable  Medication Review Press photographer) Complete  Some recent data might be hidden

## 2021-07-31 NOTE — Progress Notes (Signed)
Physical Therapy Treatment Patient Details Name: Evan Wright MRN: [id number] DOB: 09/16/1924 Today's Date: 07/31/2021   History of Present Illness 88 y.o. male with medical history significant for CAD s/p DES, T2DM with peripheral neuropathy, CKD stage III, HTN, HLD, hypothyroidism, constipation, frequent falls who presented to the ED for evaluation after a fall at home. Dx of PNA.    PT Comments    Patient is making gradual progress with mobility. He continues to require Max+2 assist for bed mobility and for pericare due to incontinence. Pt able to sit up EOB and maintain seated balance at EOB with min guard for safety. Pt completed 2 stands with Mod+2 assist and posture improved with cues for gluteus activation and pt able to take small side steps at EOB. Pt will continue to benefit from skilled PT interventions to progress independence with mobility. SNF remains appropriate setting for follow up rehab.    Recommendations for follow up therapy are one component of a multi-disciplinary discharge planning process, led by the attending physician.  Recommendations may be updated based on patient status, additional functional criteria and insurance authorization.  Follow Up Recommendations  Skilled nursing-short term rehab (<3 hours/day)     Assistance Recommended at Discharge Frequent or constant Supervision/Assistance  Patient can return home with the following Two people to help with walking and/or transfers;A lot of help with bathing/dressing/bathroom;Assistance with cooking/housework;Direct supervision/assist for medications management;Assist for transportation;Help with stairs or ramp for entrance   Equipment Recommendations  None recommended by PT    Recommendations for Other Services       Precautions / Restrictions Precautions Precautions: Fall Precaution Comments: history of falls in the last 6 months Restrictions Weight Bearing Restrictions: No     Mobility  Bed  Mobility Overal bed mobility: Needs Assistance Bed Mobility: Rolling;Supine to Sit;Sit to Supine Rolling: Max assist   Supine to sit: Max assist;+2 for physical assistance;+2 for safety/equipment;HOB elevated Sit to supine: Total assist;+2 for physical assistance;+2 for safety/equipment   General bed mobility comments: Max assist to reach for bed rail and initaite rolling, Max assist to fully roll to side to clean up BM with assist from NT. 2+ Max/Total assist to bring LE's off EOB, pt initiated but unable to complete without assist. Max to raise trunk upright. Total assist EOS to return to supine and pt placed in chair position.    Transfers Overall transfer level: Needs assistance Equipment used: Rolling walker (2 wheels) Transfers: Sit to/from Stand Sit to Stand: +2 physical assistance;+2 safety/equipment;Mod assist;From elevated surface           General transfer comment: 2x sit<>stand from EOB with Mod +2 assist to rise and maintain balance. Cues for hand placement needed and to activate gluteus max in standing to facilitate upright posture/hip extension. Pt able to take small side steps along EOB to move towards HOB, 2 small steps with Mod+2 assist and heavy reliance on RW for external support.    Ambulation/Gait                   Stairs             Wheelchair Mobility    Modified Rankin (Stroke Patients Only)       Balance Overall balance assessment: Needs assistance Sitting-balance support: Feet supported;No upper extremity supported Sitting balance-Leahy Scale: Poor     Standing balance support: Bilateral upper extremity supported Standing balance-Leahy Scale: Poor Standing balance comment: relies on BUE support  Cognition Arousal/Alertness: Awake/alert Behavior During Therapy: WFL for tasks assessed/performed;Anxious Overall Cognitive Status: Within Functional Limits for tasks assessed                                  General Comments: pleasant and cooperative        Exercises      General Comments        Pertinent Vitals/Pain Pain Assessment: No/denies pain    Home Living                          Prior Function            PT Goals (current goals can now be found in the care plan section) Acute Rehab PT Goals Patient Stated Goal: to get stronger PT Goal Formulation: With patient Time For Goal Achievement: 08/10/21 Potential to Achieve Goals: Fair Progress towards PT goals: Progressing toward goals    Frequency    Min 2X/week      PT Plan Current plan remains appropriate    Co-evaluation              AM-PAC PT "6 Clicks" Mobility   Outcome Measure  Help needed turning from your back to your side while in a flat bed without using bedrails?: A Lot Help needed moving from lying on your back to sitting on the side of a flat bed without using bedrails?: A Lot Help needed moving to and from a bed to a chair (including a wheelchair)?: A Lot Help needed standing up from a chair using your arms (e.g., wheelchair or bedside chair)?: Total Help needed to walk in hospital room?: Total Help needed climbing 3-5 steps with a railing? : Total 6 Click Score: 9    End of Session Equipment Utilized During Treatment: Gait belt Activity Tolerance: Patient limited by fatigue Patient left: in bed;with bed alarm set;with nursing/sitter in room Nurse Communication: Mobility status PT Visit Diagnosis: Difficulty in walking, not elsewhere classified (R26.2);Muscle weakness (generalized) (M62.81);History of falling (Z91.81)     Time: 0354-6568 PT Time Calculation (min) (ACUTE ONLY): 28 min  Charges:  $Therapeutic Activity: 23-37 mins                     [name], DPT Acute Rehabilitation Services Office [phone] Pager [phone]    [name] 07/31/2021, 1:26 PM

## 2021-08-01 DIAGNOSIS — K5641 Fecal impaction: Secondary | ICD-10-CM | POA: Diagnosis not present

## 2021-08-01 DIAGNOSIS — J690 Pneumonitis due to inhalation of food and vomit: Secondary | ICD-10-CM | POA: Diagnosis not present

## 2021-08-01 DIAGNOSIS — R410 Disorientation, unspecified: Secondary | ICD-10-CM | POA: Diagnosis not present

## 2021-08-01 DIAGNOSIS — R5381 Other malaise: Secondary | ICD-10-CM | POA: Diagnosis not present

## 2021-08-02 ENCOUNTER — Other Ambulatory Visit: Payer: Self-pay | Admitting: Family Medicine

## 2021-08-02 ENCOUNTER — Other Ambulatory Visit: Payer: Medicare Other

## 2021-08-02 DIAGNOSIS — I25118 Atherosclerotic heart disease of native coronary artery with other forms of angina pectoris: Secondary | ICD-10-CM

## 2021-08-02 DIAGNOSIS — E118 Type 2 diabetes mellitus with unspecified complications: Secondary | ICD-10-CM

## 2021-08-02 DIAGNOSIS — I10 Essential (primary) hypertension: Secondary | ICD-10-CM

## 2021-08-03 ENCOUNTER — Telehealth: Payer: Self-pay | Admitting: *Deleted

## 2021-08-03 NOTE — Chronic Care Management (AMB) (Signed)
°  Chronic Care Management   Outreach Note  08/03/2021 Name: Evan Wright MRN: [id number] DOB: [birth year]  Evan Wright is a 88 y.o. year old male who is a primary care patient of [name], DO. I reached out to Evan Wright by phone today in response to a referral sent by Mr. [name]'s primary care provider.  An unsuccessful telephone outreach was attempted today. The patient was referred to the case management team for assistance with care management and care coordination.   Follow Up Plan: If patient returns call to provider office, please advise to call Embedded Care Management Care Guide Kaizer Dissinger at [place], [place] Management  Direct Dial: [phone]

## 2021-08-09 NOTE — Chronic Care Management (AMB) (Signed)
°  Chronic Care Management   Outreach Note  08/09/2021 Name: [name] MRN: [id number] DOB: Sep 19, 1924  Evan Wright is a 88 y.o. year old male who is a primary care patient of [name], DO. I reached out to Evan Wright by phone today in response to a referral sent by Mr. Judieth Keens Thain's primary care provider.  A second unsuccessful telephone outreach was attempted today. The patient was referred to the case management team for assistance with care management and care coordination.   Follow Up Plan: A HIPAA compliant phone message was left for the patient providing contact information and requesting a return call.  If patient returns call to provider office, please advise to call Embedded Care Management Care Guide Zyshawn Bohnenkamp at [place], [place] Management  Direct Dial: [phone]

## 2021-08-10 NOTE — Chronic Care Management (AMB) (Signed)
Chronic Care Management   Note  08/10/2021 Name: ARDELL MAKAREWICZ MRN: [id number] DOB: Aug 17, 1924  [name] is a 88 y.o. year old male who is a primary care patient of [name], DO. I reached out to [name] by phone today in response to a referral sent by Mr. Judieth Keens Hodgens's PCP.  Mr. Lukasik was given information about Chronic Care Management services today including:  CCM service includes personalized support from designated clinical staff supervised by his physician, including individualized plan of care and coordination with other care providers 24/7 contact phone numbers for assistance for urgent and routine care needs. Service will only be billed when office clinical staff spend 20 minutes or more in a month to coordinate care. Only one practitioner may furnish and bill the service in a calendar month. The patient may stop CCM services at any time (effective at the end of the month) by phone call to the office staff. The patient is responsible for co-pay (up to 20% after annual deductible is met) if co-pay is required by the individual health plan.   Patient agreed to services and verbal consent obtained.   Follow up plan: Telephone appointment with care management team member scheduled for: 09/03/2021  Julian Hy, [place] Management  Direct Dial: [phone]

## 2021-08-13 DIAGNOSIS — D649 Anemia, unspecified: Secondary | ICD-10-CM | POA: Diagnosis not present

## 2021-08-13 DIAGNOSIS — N390 Urinary tract infection, site not specified: Secondary | ICD-10-CM | POA: Diagnosis not present

## 2021-08-13 DIAGNOSIS — R4182 Altered mental status, unspecified: Secondary | ICD-10-CM | POA: Diagnosis not present

## 2021-08-13 DIAGNOSIS — E860 Dehydration: Secondary | ICD-10-CM | POA: Diagnosis not present

## 2021-08-15 DIAGNOSIS — N200 Calculus of kidney: Secondary | ICD-10-CM | POA: Diagnosis not present

## 2021-08-15 DIAGNOSIS — R338 Other retention of urine: Secondary | ICD-10-CM | POA: Diagnosis not present

## 2021-08-15 DIAGNOSIS — R8271 Bacteriuria: Secondary | ICD-10-CM | POA: Diagnosis not present

## 2021-08-15 DIAGNOSIS — Z8546 Personal history of malignant neoplasm of prostate: Secondary | ICD-10-CM | POA: Diagnosis not present

## 2021-08-20 DIAGNOSIS — N390 Urinary tract infection, site not specified: Secondary | ICD-10-CM | POA: Diagnosis not present

## 2021-08-20 DIAGNOSIS — D649 Anemia, unspecified: Secondary | ICD-10-CM | POA: Diagnosis not present

## 2021-08-20 DIAGNOSIS — E860 Dehydration: Secondary | ICD-10-CM | POA: Diagnosis not present

## 2021-08-20 DIAGNOSIS — R5381 Other malaise: Secondary | ICD-10-CM | POA: Diagnosis not present

## 2021-08-22 DIAGNOSIS — N179 Acute kidney failure, unspecified: Secondary | ICD-10-CM | POA: Diagnosis not present

## 2021-08-22 DIAGNOSIS — N189 Chronic kidney disease, unspecified: Secondary | ICD-10-CM | POA: Diagnosis not present

## 2021-08-22 DIAGNOSIS — N390 Urinary tract infection, site not specified: Secondary | ICD-10-CM | POA: Diagnosis not present

## 2021-08-22 DIAGNOSIS — R4182 Altered mental status, unspecified: Secondary | ICD-10-CM | POA: Diagnosis not present

## 2021-08-23 DIAGNOSIS — R338 Other retention of urine: Secondary | ICD-10-CM | POA: Diagnosis not present

## 2021-08-27 ENCOUNTER — Telehealth: Payer: Self-pay | Admitting: Family Medicine

## 2021-08-27 ENCOUNTER — Telehealth: Payer: Self-pay | Admitting: Pharmacist

## 2021-08-27 DIAGNOSIS — I351 Nonrheumatic aortic (valve) insufficiency: Secondary | ICD-10-CM | POA: Diagnosis not present

## 2021-08-27 DIAGNOSIS — E039 Hypothyroidism, unspecified: Secondary | ICD-10-CM | POA: Diagnosis not present

## 2021-08-27 DIAGNOSIS — E1122 Type 2 diabetes mellitus with diabetic chronic kidney disease: Secondary | ICD-10-CM | POA: Diagnosis not present

## 2021-08-27 DIAGNOSIS — I129 Hypertensive chronic kidney disease with stage 1 through stage 4 chronic kidney disease, or unspecified chronic kidney disease: Secondary | ICD-10-CM | POA: Diagnosis not present

## 2021-08-27 DIAGNOSIS — N189 Chronic kidney disease, unspecified: Secondary | ICD-10-CM | POA: Diagnosis not present

## 2021-08-27 DIAGNOSIS — N390 Urinary tract infection, site not specified: Secondary | ICD-10-CM | POA: Diagnosis not present

## 2021-08-27 DIAGNOSIS — I714 Abdominal aortic aneurysm, without rupture, unspecified: Secondary | ICD-10-CM | POA: Diagnosis not present

## 2021-08-27 DIAGNOSIS — J690 Pneumonitis due to inhalation of food and vomit: Secondary | ICD-10-CM | POA: Diagnosis not present

## 2021-08-27 DIAGNOSIS — I25119 Atherosclerotic heart disease of native coronary artery with unspecified angina pectoris: Secondary | ICD-10-CM | POA: Diagnosis not present

## 2021-08-27 DIAGNOSIS — E876 Hypokalemia: Secondary | ICD-10-CM | POA: Diagnosis not present

## 2021-08-27 DIAGNOSIS — I255 Ischemic cardiomyopathy: Secondary | ICD-10-CM | POA: Diagnosis not present

## 2021-08-27 DIAGNOSIS — I252 Old myocardial infarction: Secondary | ICD-10-CM | POA: Diagnosis not present

## 2021-08-27 DIAGNOSIS — N179 Acute kidney failure, unspecified: Secondary | ICD-10-CM | POA: Diagnosis not present

## 2021-08-27 DIAGNOSIS — E1140 Type 2 diabetes mellitus with diabetic neuropathy, unspecified: Secondary | ICD-10-CM | POA: Diagnosis not present

## 2021-08-27 DIAGNOSIS — D631 Anemia in chronic kidney disease: Secondary | ICD-10-CM | POA: Diagnosis not present

## 2021-08-27 DIAGNOSIS — R339 Retention of urine, unspecified: Secondary | ICD-10-CM | POA: Diagnosis not present

## 2021-08-27 NOTE — Telephone Encounter (Signed)
Pt's daughter called to get a refill on medication pt was prescribed at the hospital. Did try to make a hfu but she is unsure if that is necessary. She also wanted to note he is no longer on metformin or zetia. Please advise.   Medication: amlodipine 2.5 mg  Has the patient contacted their pharmacy? No.   Preferred Pharmacy: [place], [place]  [address] [place] [address]  Phone:  [phone]  Fax:  [phone]

## 2021-08-27 NOTE — Chronic Care Management (AMB) (Signed)
Chronic Care Management Pharmacy Assistant   Name: Evan Wright  MRN: [id number] DOB: 06/30/1925  Evan Wright is an 88 y.o. year old male who presents for his initial CCM visit with the clinical pharmacist.  Recent office visits:  06/26/21-Yvonne R. Carollee Herter, DO (PCP) Seen for annual exam visit. Labs ordered. Stop Metformin due to diarrhea. Follow up in 6 months.  Recent consult visits:  07/02/21-(Podiatry) Marzetta Board, DPM. Seen for diabetic foot exam. Follow up in 3 months. 04/02/21-(Podiatry) Marzetta Board, DPM. Seen for nail problem. Follow up in 3 months. 03/13/21-(Cardiology) Reita Cliche. Revankar, MD. Seen for general cardiac follow up visit. Labs ordered. EKG completed. Follow up in 6 months.   Hospital visits:  Admitted to the hospital on 07/25/21 due to Urinary Retention. Discharge date was 07/31/21 . Discharged from [place]?Medications Started at [facility] Discharge:?? -started None noted  Medication Changes at Hospital Discharge: -Changed None noted  Medications Discontinued at Hospital Discharge: -Stopped None noted  Medications that remain the same after Hospital Discharge:??  -All other medications will remain the same.   Admitted to the hospital on 03/08/21 due to a fall . Discharge date was 03/08/21. Discharged from [place]?Medications Started at [facility] Discharge:?? -started None noted  Medication Changes at Hospital Discharge: -Changed None noted  Medications Discontinued at Hospital Discharge: -Stopped None noted  Medications that remain the same after Hospital Discharge:??  -All other medications will remain the same.     Medications: Outpatient Encounter Medications as of 08/27/2021  Medication Sig Note   acetaminophen (TYLENOL) 500 MG tablet Take 1,000 mg by mouth every 6 (six) hours as needed for mild pain.    alendronate (FOSAMAX) 70 MG tablet Take 1 tablet (70 mg  total) by mouth every 7 (seven) days. Take with a full glass of water on an empty stomach. 07/27/2021: Two weeks ago ( not sure of the exact day)   aspirin EC 81 MG tablet Take 1 tablet (81 mg total) by mouth daily at 12 noon.    atorvastatin (LIPITOR) 80 MG tablet TAKE ONE TABLET (80MG  TOTAL) BY MOUTH DAILY (Patient taking differently: Take 80 mg by mouth daily.)    Calcium Carb-Cholecalciferol (CALCIUM+D3 PO) Take 1 tablet by mouth daily at 12 noon.    carboxymethylcellulose (REFRESH PLUS) 0.5 % SOLN 1 drop daily as needed (dry eyes).    carvedilol (COREG) 3.125 MG tablet TAKE ONE TABLET (3.125MG  TOTAL) BY MOUTHTWICE DAILY WITH A MEAL (Patient taking differently: Take 3.125 mg by mouth 2 (two) times daily with a meal.)    ezetimibe (ZETIA) 10 MG tablet TAKE ONE TABLET (10MG  TOTAL) BY MOUTH DAILY (Patient not taking: Reported on 07/27/2021)    lansoprazole (PREVACID) 30 MG capsule Take 1 capsule (30 mg total) by mouth 2 (two) times daily before a meal.    levofloxacin (LEVAQUIN) 500 MG tablet Take 1 tablet (500 mg total) by mouth every other day.    levothyroxine (SYNTHROID) 100 MCG tablet Take 1 tablet (100 mcg total) by mouth daily before breakfast. Pt due for office visit    Multiple Vitamins-Minerals (PRESERVISION AREDS 2) CAPS Take 1 capsule by mouth daily at 12 noon.    nitroGLYCERIN (NITROSTAT) 0.4 MG SL tablet Place 0.4 mg under the tongue every 5 (five) minutes as needed for chest pain.    ranolazine (RANEXA) 500 MG 12 hr tablet TAKE ONE TABLET (500 MG TOTAL) BY  MOUTH ONCE DAILY (Patient taking differently: 500 mg daily.)    Vitamin D, Ergocalciferol, (DRISDOL) 1.25 MG (50000 UNIT) CAPS capsule TAKE ONE CAPSULE (50000 UNITS TOTAL) BY MOUTH EVERY 7 DAYS (Patient taking differently: Take 50,000 Units by mouth every 7 (seven) days.)    No facility-administered encounter medications on file as of 08/27/2021.   Acetaminophen (TYLENOL) 500 MG tablet Last filled:None noted Alendronate (FOSAMAX) 70 MG  tablet Last filled:07/31/21 28 DS Aspirin EC 81 MG tablet Last filled:None noted Atorvastatin (LIPITOR) 80 MG tablet Last filled:07/31/21 30 DS Calcium Carb-Cholecalciferol (CALCIUM+D3 PO) Last filled:None noted Carboxymethylcellulose (REFRESH PLUS) 0.5 % SOLN Last filled:07/31/21 15 DS Carvedilol (COREG) 3.125 MG tablet Last filled:07/31/21 30 DS Ezetimibe (ZETIA) 10 MG tablet Last filled:07/31/21 30 DS Lansoprazole (PREVACID) 30 MG capsule Last filled:07/31/21 30 DS Levofloxacin (LEVAQUIN) 500 MG tablet Last filled:07/31/21 8 DS Levothyroxine (SYNTHROID) 100 MCG tablet Last filled:07/31/21 30 DS NitroGLYCERIN (NITROSTAT) 0.4 MG SL tablet Last filled:07/31/21 8 DS Ranolazine (RANEXA) 500 MG 12 hr tablet Last filled:07/31/21 30 DS Vitamin D, Ergocalciferol, (DRISDOL) 1.25 MG (50000 UNIT) CAPS capsule Last filled:None noted   Care Gaps: TETANUS/TDAP:Never done Zoster Vaccines- Shingrix:Never done COVID-19 Vaccine:Last completed: Jun 09, 2020 OPHTHALMOLOGY EXAM:Last completed: Sep 08, 2019 URINE MICROALBUMIN:Last completed: Jun 02, 2020  Star Rating Drugs: Atorvastatin (LIPITOR) 80 MG tablet Last filled:07/31/21 30 DS  Corrie Mckusick, [place]

## 2021-08-28 ENCOUNTER — Telehealth: Payer: Self-pay | Admitting: Family Medicine

## 2021-08-28 ENCOUNTER — Other Ambulatory Visit: Payer: Self-pay

## 2021-08-28 DIAGNOSIS — R29898 Other symptoms and signs involving the musculoskeletal system: Secondary | ICD-10-CM

## 2021-08-28 DIAGNOSIS — R269 Unspecified abnormalities of gait and mobility: Secondary | ICD-10-CM

## 2021-08-28 DIAGNOSIS — R296 Repeated falls: Secondary | ICD-10-CM

## 2021-08-28 NOTE — Telephone Encounter (Signed)
I reviewed patient's discharge summary and didn't see anything regarding Amlodipine; if patient is supposed to continue or discontinue? Med is not on med list currently but is under the reconcile tab. Could you check behind me please? Please advise

## 2021-08-28 NOTE — Telephone Encounter (Signed)
Pt's daughter is requesting a lightweight wheelchair to help him when they leave the house. They are currently using one that is much heavier and it is hard to take him to appointments. They are hoping to use insurance for this. Please advise.

## 2021-08-28 NOTE — Telephone Encounter (Signed)
Arbie Cookey from [facility] [phone]) verbal orders.  Twice a week this week, once a week for 3 weeks, urine sample next visit, and blood work in 2 weeks for CBC/BMT

## 2021-08-28 NOTE — Telephone Encounter (Signed)
Spoke with patient's daughter. Order has been placed for wheelchair and sent to [facility].

## 2021-08-28 NOTE — Telephone Encounter (Signed)
I don't see it, wouldn't take if it wasn't rx'd. Would rec hosp dc f/u to resolve issues like this. Ty.

## 2021-08-28 NOTE — Telephone Encounter (Signed)
Verbal given 

## 2021-08-29 NOTE — Telephone Encounter (Signed)
Spoke with [facility] and they are faxing over notes from when patient was there.  Awaiting notes,

## 2021-08-29 NOTE — Telephone Encounter (Signed)
Spoke with daughter and she think this was given at the rehab place [facility] in [place].  I will call to get note from them.  [phone]

## 2021-08-30 DIAGNOSIS — N390 Urinary tract infection, site not specified: Secondary | ICD-10-CM | POA: Diagnosis not present

## 2021-08-30 NOTE — Telephone Encounter (Signed)
Received record from [facility] and it stated that amlodipine was started from 08/22/21-08/25/21.  Advised daughter that medication was only used for that time frame.  Also per Dr. Nani Ravens to keep a check on blood pressures until follow up with Dr. Etter Sjogren.  Advised to also keep a log when they get a visit.

## 2021-08-31 DIAGNOSIS — R338 Other retention of urine: Secondary | ICD-10-CM | POA: Diagnosis not present

## 2021-08-31 DIAGNOSIS — Z8546 Personal history of malignant neoplasm of prostate: Secondary | ICD-10-CM | POA: Diagnosis not present

## 2021-09-03 ENCOUNTER — Ambulatory Visit (INDEPENDENT_AMBULATORY_CARE_PROVIDER_SITE_OTHER): Payer: Medicare Other | Admitting: Pharmacist

## 2021-09-03 DIAGNOSIS — M8580 Other specified disorders of bone density and structure, unspecified site: Secondary | ICD-10-CM

## 2021-09-03 DIAGNOSIS — I10 Essential (primary) hypertension: Secondary | ICD-10-CM

## 2021-09-03 DIAGNOSIS — I25118 Atherosclerotic heart disease of native coronary artery with other forms of angina pectoris: Secondary | ICD-10-CM

## 2021-09-03 DIAGNOSIS — N289 Disorder of kidney and ureter, unspecified: Secondary | ICD-10-CM

## 2021-09-03 DIAGNOSIS — E118 Type 2 diabetes mellitus with unspecified complications: Secondary | ICD-10-CM

## 2021-09-03 DIAGNOSIS — E1169 Type 2 diabetes mellitus with other specified complication: Secondary | ICD-10-CM

## 2021-09-03 NOTE — Chronic Care Management (AMB) (Signed)
Chronic Care Management Pharmacy Note  09/03/2021 Name:  Evan Wright MRN:  [id number] DOB:  [birth year]  Summary: Noted decrease in renal function over the last year. Scr was 1.65 / eGFR 38 and estimated creatinine clearance 18 mL/min.  Patient has just returned from SNF stay. Home health is coming out for physical and occupational therapy and nurse care for catheter. Patient will have bladder study 3/3 and sees urology 10/08/21.  Noted that ezetimibe was stopped during SNF stay. Last LDL was 33 - patient is still taking atorvastatin 52m daily . Last A1c was 7.1% but metformin was stopped due to diarrhea/ patient is not checking blood glucose at home currently.   Recommendations/Plan: Recommend holding alendronate due to recent decrease in Creatinine clearance (usually not recommended when CrCL is < 360m min) Consider rechecking CMP in 1 month.  Continue to hold metformin - recommended family check blood glucose 1 to 2 times per week.  Ok to stay off ezetimibe. Recheck lipids in 3 to 6 months. Consider restarting ezetimibe if LDL >70.    Subjective: Evan BYERSs an [age].o. year old male who is a primary patient of LoAnn HeldDO.  The CCM team was consulted for assistance with disease management and care coordination needs.    Engaged with patient's daughter, Evan Wright telephone for initial visit in response to provider referral for pharmacy case management and/or care coordination services.   Consent to Services:  The patient was given the following information about Chronic Care Management services today, agreed to services, and gave verbal consent: 1. CCM service includes personalized support from designated clinical staff supervised by the primary care provider, including individualized plan of care and coordination with other care providers 2. 24/7 contact phone numbers for assistance for urgent and routine care needs. 3. Service will only be billed when office  clinical staff spend 20 minutes or more in a month to coordinate care. 4. Only one practitioner may furnish and bill the service in a calendar month. 5.The patient may stop CCM services at any time (effective at the end of the month) by phone call to the office staff. 6. The patient will be responsible for cost sharing (co-pay) of up to 20% of the service fee (after annual deductible is met). Patient agreed to services and consent obtained.  Patient Care Team: LoCarollee HerterYvAlferd ApaDO as PCP - General DiCalvert CantorMD as Consulting Physician (Ophthalmology) TaCarolan ClinesMD (Inactive) as Consulting Physician (Urology) AqCameron SprangMD as Consulting Physician (Neurology) EcCherre RobinsRPChavesPharmacist)  Recent office visits: 06/26/21-Yvonne R.Gibson RampDO (PCP) Seen for annual exam visit. Labs ordered. Stop Metformin due to diarrhea. Follow up in 6 months.   Recent consult visits:  07/02/21-(Podiatry) JeMarzetta BoardDPM. Seen for diabetic foot exam. Follow up in 3 months. 04/02/21-(Podiatry) JeMarzetta BoardDPM. Seen for nail problem. Follow up in 3 months. 03/13/21-(Cardiology) RaReita ClicheRevankar, MD. Seen for general cardiac follow up visit. Labs ordered. EKG completed. Follow up in 6 months.    Hospital visits:  07/31/2021 thru 08/25/2021 [facility] (Exetimibe no longer on medication list after discharge from SNF. [facility]. Discharged with Foley cath. New medications started: Levaquin 50060mvery OTHER day thru 08/01/2021; mention of Miralax every other day to prevent constipation.    03/08/2021 - ED Visit  for fall New?Medications Started at [facility] Discharge:?? -started None noted Medication Changes at Hospital Discharge: -Changed None noted Medications Discontinued at Hospital  Discharge: -Stopped None noted    Objective:  Lab Results  Component Value Date   CREATININE 1.65 (H) 07/31/2021   CREATININE 1.64 (H) 07/30/2021   CREATININE 1.43 (H) 07/29/2021    Lab Results  Component Value Date   HGBA1C 7.1 (H) 06/26/2021   Last diabetic Eye exam: No results found for: HMDIABEYEEXA  Last diabetic Foot exam: No results found for: HMDIABFOOTEX      Component Value Date/Time   CHOL 101 06/26/2021 1145   CHOL 122 04/26/2021 1048   TRIG 130.0 06/26/2021 1145   HDL 42.20 06/26/2021 1145   HDL 45 04/26/2021 1048   CHOLHDL 2 06/26/2021 1145   VLDL 26.0 06/26/2021 1145   LDLCALC 33 06/26/2021 1145   LDLCALC 56 04/26/2021 1048   LDLDIRECT 165.3 10/08/2010 1008    Hepatic Function Latest Ref Rng & Units 07/30/2021 07/28/2021 07/26/2021  Total Protein 6.5 - 8.1 g/dL 5.7(L) 5.9(L) 6.4(L)  Albumin 3.5 - 5.0 g/dL 2.5(L) 2.6(L) 2.8(L)  AST 15 - 41 U/L 18 34 19  ALT 0 - 44 U/L 21 27 12   Alk Phosphatase 38 - 126 U/L 62 56 55  Total Bilirubin 0.3 - 1.2 mg/dL 0.6 0.6 1.2  Bilirubin, Direct 0.00 - 0.40 mg/dL - - -    Lab Results  Component Value Date/Time   TSH 1.95 06/26/2021 11:45 AM   TSH 4.310 03/13/2021 01:47 PM    CBC Latest Ref Rng & Units 07/31/2021 07/29/2021 07/28/2021  WBC 4.0 - 10.5 K/uL 9.8 9.1 10.2  Hemoglobin 13.0 - 17.0 g/dL 11.8(L) 11.4(L) 11.7(L)  Hematocrit 39.0 - 52.0 % 37.1(L) 36.6(L) 37.4(L)  Platelets 150 - 400 K/uL 235 227 223    Lab Results  Component Value Date/Time   VD25OH 24.39 (L) 06/02/2020 11:17 AM   VD25OH 35 07/12/2010 08:35 PM    Clinical ASCVD: Yes  The ASCVD Risk score (Arnett DK, et al., 2019) failed to calculate for the following reasons:   The 2019 ASCVD risk score is only valid for ages 76 to 11    Other:  DEXA DualFemur Total Mean 09/05/2020 94.0 N/A -1.9 0.767 g/cm2 DualFemur Total Mean 07/01/2018 91.8 N/A -1.7 0.792 g/cm2 DualFemur Total Mean 06/11/2016 89.7 N/A -1.7 0.791 g/cm2   Left Forearm Radius 33%  09/05/2020 94.0 N/A -3.3 0.662 g/cm2 Left Forearm Radius 33% 07/01/2018 91.8 N/A -2.9 0.704 g/cm2 Left Forearm Radius 33% 06/11/2016 89.7 N/A -3.0 0.697 g/cm2  Social History   Tobacco Use  Smoking Status Never  Smokeless Tobacco Never   BP Readings from Last 3 Encounters:  07/31/21 (!) 174/84  06/26/21 124/80  03/13/21 (!) 182/80   Pulse Readings from Last 3 Encounters:  07/31/21 70  06/26/21 67  03/13/21 67   Wt Readings from Last 3 Encounters:  07/29/21 162 lb 0.6 oz (73.5 kg)  06/26/21 151 lb 6.4 oz (68.7 kg)  03/13/21 165 lb 1.3 oz (74.9 kg)    Assessment: Review of patient past medical history, allergies, medications, health status, including review of consultants reports, laboratory and other test data, was performed as part of comprehensive evaluation and provision of chronic care management services.   SDOH:  (Social Determinants of Health) assessments and interventions performed:  SDOH Interventions    Flowsheet Row Most Recent Value  SDOH Interventions   Financial Strain Interventions Intervention Not Indicated  Transportation Interventions Intervention Not Indicated  CCM Care Plan  Allergies  Allergen Reactions   Penicillins Other (See Comments)    Unknown reaction Did it involve swelling of the face/tongue/throat, SOB, or low BP? Unknown Did it involve sudden or severe rash/hives, skin peeling, or any reaction on the inside of your mouth or nose? Unknown Did you need to seek medical attention at a hospital or doctor's office? Unknown When did it last happen?    1940   If all above answers are NO, may proceed with cephalosporin use.    Medications Reviewed Today     Reviewed by [name], RPH-CPP (Pharmacist) on 09/03/21 at [id number]  Med List Status: <None>   Medication Order Taking? Sig Documenting Provider Last Dose Status Informant  acetaminophen (TYLENOL) 500 MG tablet [id number] Yes Take 1,000 mg by mouth every 6 (six) hours as needed  for mild pain. [provider] Taking Active Child  alendronate (FOSAMAX) 70 MG tablet [id number] Yes Take 1 tablet (70 mg total) by mouth every 7 (seven) days. Take with a full glass of water on an empty stomach. [name], DO Taking Active Child           Med Note Barbaraann Boys Sep 03, 2021  9:15 AM)    aspirin EC 81 MG tablet [id number] Yes Take 1 tablet (81 mg total) by mouth daily at 12 noon. Modena Jansky, MD Taking Active Child  atorvastatin (LIPITOR) 80 MG tablet [id number] Yes TAKE ONE TABLET (80MG TOTAL) BY MOUTH DAILY Park Liter, MD Taking Active Child  Calcium Carb-Cholecalciferol (CALCIUM+D3 PO) [id number] Yes Take 500 mg by mouth daily at 12 noon. [provider] Taking Active Child  carboxymethylcellulose (REFRESH PLUS) 0.5 % SOLN [id number] Yes 1 drop daily as needed (dry eyes). [provider] Taking Active Child  carvedilol (COREG) 3.125 MG tablet [id number] Yes TAKE ONE TABLET (3.125MG TOTAL) BY MOUTHTWICE DAILY WITH A MEAL [name], DO Taking Active Child  lansoprazole (PREVACID) 30 MG capsule [id number] Yes Take 1 capsule (30 mg total) by mouth 2 (two) times daily before a meal. Carollee Herter, Alferd Apa, DO Taking Active Child  levothyroxine (SYNTHROID) 100 MCG tablet [id number] Yes Take 1 tablet (100 mcg total) by mouth daily before breakfast. Pt due for office visit [name], DO Taking Active Child  magnesium oxide (MAG-OX) 400 MG tablet [id number] Yes Take 400 mg by mouth daily. [provider] Taking Active   Multiple Vitamins-Minerals (PRESERVISION AREDS 2) CAPS [id number] Yes Take 1 capsule by mouth daily at 12 noon. [provider] Taking Active Child  nitroGLYCERIN (NITROSTAT) 0.4 MG SL tablet [id number] Yes Place 0.4 mg under the tongue every 5 (five) minutes as needed for chest pain. [provider] Taking Active Child  ranolazine (RANEXA) 500 MG 12 hr tablet [id number] Yes  TAKE ONE TABLET (500 MG TOTAL) BY MOUTH ONCE DAILY Revankar, Reita Cliche, MD Taking Active Child  Vitamin D, Ergocalciferol, (DRISDOL) 1.25 MG (50000 UNIT) CAPS capsule [id number] Yes TAKE ONE CAPSULE (50000 UNITS TOTAL) BY MOUTH EVERY 7 DAYS [name], DO Taking Active Child            Patient Active Problem List   Diagnosis Date Noted   PNA (pneumonia) 07/27/2021   Fecal impaction ([place]) 07/26/2021   Acute kidney injury superimposed on CKD ([place]) 07/26/2021   Urinary retention 07/26/2021   Frequent falls 07/26/2021   Hypokalemia 07/26/2021   Right lower lobe pneumonia 07/26/2021  Traumatic subdural hemorrhage with loss of consciousness of unspecified duration, initial encounter ([place]) 06/26/2021   Diarrhea 06/26/2021   Moderate aortic regurgitation 03/13/2021   Ascending aortic aneurysm 03/13/2021   Pedal edema 03/13/2021   Adenomatous colon polyp    CAD (coronary artery disease)    Cardiac murmur    Coronary artery disease    Diverticulosis    GERD (gastroesophageal reflux disease)    History of hiatal hernia    Hyperlipidemia associated with type 2 diabetes mellitus (HCC)    Prostate cancer ([place])    Osteoarthritis    Intermittent claudication ([place]) 06/02/2020   Slow transit constipation 08/10/2019   SDH (subdural hematoma) 09/28/2018   Seizure-like activity ([place]) 09/28/2018   Right arm weakness 09/28/2018   Preventative health care 09/23/2018   Type 2 diabetes mellitus ([place]) 09/23/2018   Osteopenia 09/25/2017   Hypertension associated with diabetes ([place]) 09/25/2017   Hyperglycemia 09/25/2017   Hypothyroidism 09/25/2017   CAD (coronary artery disease), native coronary artery 04/29/2017   Nonsustained ventricular tachycardia 04/24/2017   Angina pectoris ([place]) 04/2017   Ischemic cardiomyopathy 04/10/2017   Mixed dyslipidemia 04/10/2017   Abnormal EKG 04/02/2017   Idiopathic peripheral neuropathy 10/11/2016   Proximal leg weakness 10/11/2016   Weakness of both  lower extremities 06/11/2016   Loss of height 06/11/2016   De Quervain's tenosynovitis 05/18/2012   Family history of malignant neoplasm of gastrointestinal tract 05/14/2011   NEVI, MULTIPLE 07/12/2010   OSTEOARTHRITIS 07/12/2010   UNSPECIFIED VITAMIN D DEFICIENCY 02/11/2008   HIATAL HERNIA WITH REFLUX 02/11/2008   GASTROENTERITIS 01/20/2008   PERSONAL HISTORY OF COLONIC POLYPS 11/17/2007   DIZZINESS 10/15/2007   PROSTATE CANCER, HX OF 10/15/2007    Immunization History  Administered Date(s) Administered   Fluad Quad(high Dose 65+) 04/30/2019, 06/02/2020, 06/26/2021   Influenza Split 05/18/2012, 08/06/2014   Influenza Whole 05/09/2010   Influenza, High Dose Seasonal PF 06/11/2016, 05/02/2017, 04/13/2018   Influenza-Unspecified 05/22/2013   PFIZER(Purple Top)SARS-COV-2 Vaccination 08/26/2019, 09/16/2019, 06/09/2020   Pneumococcal Conjugate-13 08/06/2014   Pneumococcal Polysaccharide-23 07/03/2005    Conditions to be addressed/monitored: CAD, HTN, HLD, DMII, CKD Stage 3b, GERD, Hypothyroidism, and Osteoporosis; aortic aneurysm; ischemic cardiomyopathy; neuropathy  There are no care plans that you recently modified to display for this patient.   Medication Assistance: None required.  Patient affirms current coverage meets needs.  Patient's preferred pharmacy is:  [place], [place] - [address] [place] [address] Phone: [phone] Fax: [place], [place] - [address] [place] [address] Phone: [phone] Fax: [phone]  [id number] [address]: [phone]: [phone] [place] [place]: [phone]: [phone] Uses pill box? Yes Daughter assist with  medication management  Follow Up:  Patient agrees to Care Plan and Follow-up.  Plan: Telephone follow up appointment with care management team member scheduled for:  2 to 4 weeks  TCherre Robins PharmD Clinical Pharmacist [facility]

## 2021-09-03 NOTE — Patient Instructions (Addendum)
Mr. Raden and family It was a pleasure speaking with you today.  I have attached a summary of our visit today and information about your health goals. (See below)  Our next appointment is by telephone on September 25, 2021 at 9am  Please call the care guide team at [phone] if you need to cancel or reschedule your appointment.    If you have any questions or concerns, please feel free to contact me either at the phone number below or with a MyChart message.    [name], PharmD Clinical Pharmacist [facility] [phone] (direct line)  [phone] (main office number)  [place] for Chronic Care Management (created 09/03/2021)  Diabetes: Controlled; Last A1c was 7.1% (given patient's age,  would recommended A1c < 8.0%) current treatment: None currently Metformin stopped in December 2023 due to diarrhea Interventions:  Reviewed A1c goal Reviewed home blood glucose goals  Fasting blood glucose goal (before meals) = 80 to 130 Blood glucose goal after a meal = less than 180  Check blood glucose 1 or 2 times per week Reminded to get annual eye exam  Hypertension: Variable blood pressure goal < 140/90 BP Readings from Last 3 Encounters:  07/31/21 (!) 174/84  06/26/21 124/80  03/13/21 (!) 182/80   Current treatment: Carvedilol 3.125mg  twice a day Interventions:  Discussed blood pressure goal with family Continue current therapy for blood pressure  Check blood pressure at home 2 to 3 times per week and record (home health can check)   Hyperlipidemia / heart disease: Controlled; LDL goal <70 Lab Results  Component Value Date   LDLCALC 33 06/26/2021   Current treatment: Atorvastatin 80mg  daily  Aspirin 81mg  daily  Ranolazine 500mg  once a day Interventions:  Discussed importance of atorvastatin in prevention of secondary heart disease.  Education provided about LDL goal Recommended continue current therapy. Recheck lipids  in 3 to 6 months. If LDL > 70 then consider restarting ezetimibe.   Osteoporosis: Current treatment: Alendronate 70mg  weekly Interventions:  Recommended hold alendronate since kidney function has changed  Patient Goals/Self-Care Activities Over the next 60 days, patient will:  take medications as prescribed,  check glucose 1 to 2 times per week, document, and provide at future appointments,  collaborate with provider on medication access solutions Continue to work with home health with occupation and physical therapy Hold alendronate until further notice.   Follow Up Plan: Telephone follow up appointment with care management team member scheduled for:  2 to 3 weeks   Patient verbalizes understanding of instructions and care plan provided today and agrees to view in [place]. Active MyChart status confirmed with patient.

## 2021-09-07 ENCOUNTER — Telehealth: Payer: Self-pay

## 2021-09-07 ENCOUNTER — Other Ambulatory Visit: Payer: Self-pay

## 2021-09-07 MED ORDER — CEPHALEXIN 500 MG PO CAPS: 500.0000 mg | ORAL_CAPSULE | Freq: Two times a day (BID) | ORAL | 0 refills | Status: DC

## 2021-09-07 NOTE — Telephone Encounter (Signed)
Called patient's daughter and LVM advising that we received results from [place] for pt's urine. Urine showed UTI. Keflex was sent in.

## 2021-09-12 DIAGNOSIS — I129 Hypertensive chronic kidney disease with stage 1 through stage 4 chronic kidney disease, or unspecified chronic kidney disease: Secondary | ICD-10-CM | POA: Diagnosis not present

## 2021-09-12 LAB — BASIC METABOLIC PANEL
BUN: 25 — AB (ref 4–21)
CO2: 17 (ref 13–22)
Chloride: 108 (ref 99–108)
Creatinine: 1.8 — AB (ref 0.6–1.3)
Glucose: 174
Potassium: 3.8 (ref 3.4–5.3)
Sodium: 145 (ref 137–147)

## 2021-09-12 LAB — CBC AND DIFFERENTIAL
HCT: 36 — AB (ref 41–53)
Hemoglobin: 11.7 — AB (ref 13.5–17.5)
Neutrophils Absolute: 4.4
Platelets: 189 (ref 150–399)
WBC: 5.8

## 2021-09-12 LAB — COMPREHENSIVE METABOLIC PANEL
Albumin: 3.4 — AB (ref 3.5–5.0)
Calcium: 9.1 (ref 8.7–10.7)
eGFR: 35

## 2021-09-12 LAB — CBC: RBC: 3.95 (ref 3.87–5.11)

## 2021-09-13 ENCOUNTER — Other Ambulatory Visit: Payer: Self-pay | Admitting: Family Medicine

## 2021-09-13 ENCOUNTER — Telehealth: Payer: Medicare Other | Admitting: Family Medicine

## 2021-09-18 ENCOUNTER — Encounter: Payer: Self-pay | Admitting: *Deleted

## 2021-09-18 DIAGNOSIS — E1169 Type 2 diabetes mellitus with other specified complication: Secondary | ICD-10-CM

## 2021-09-18 DIAGNOSIS — I10 Essential (primary) hypertension: Secondary | ICD-10-CM | POA: Diagnosis not present

## 2021-09-18 DIAGNOSIS — E785 Hyperlipidemia, unspecified: Secondary | ICD-10-CM

## 2021-09-18 DIAGNOSIS — I25118 Atherosclerotic heart disease of native coronary artery with other forms of angina pectoris: Secondary | ICD-10-CM | POA: Diagnosis not present

## 2021-09-18 DIAGNOSIS — E118 Type 2 diabetes mellitus with unspecified complications: Secondary | ICD-10-CM

## 2021-09-21 ENCOUNTER — Ambulatory Visit: Payer: Medicare Other | Admitting: Cardiology

## 2021-09-21 DIAGNOSIS — R338 Other retention of urine: Secondary | ICD-10-CM | POA: Diagnosis not present

## 2021-09-24 ENCOUNTER — Other Ambulatory Visit: Payer: Self-pay

## 2021-09-24 ENCOUNTER — Ambulatory Visit: Payer: Medicare Other | Admitting: Podiatry

## 2021-09-24 ENCOUNTER — Encounter: Payer: Self-pay | Admitting: Podiatry

## 2021-09-24 DIAGNOSIS — B351 Tinea unguium: Secondary | ICD-10-CM | POA: Diagnosis not present

## 2021-09-24 DIAGNOSIS — E1142 Type 2 diabetes mellitus with diabetic polyneuropathy: Secondary | ICD-10-CM | POA: Diagnosis not present

## 2021-09-24 DIAGNOSIS — M79674 Pain in right toe(s): Secondary | ICD-10-CM

## 2021-09-24 DIAGNOSIS — M79675 Pain in left toe(s): Secondary | ICD-10-CM

## 2021-09-25 ENCOUNTER — Ambulatory Visit (INDEPENDENT_AMBULATORY_CARE_PROVIDER_SITE_OTHER): Payer: Medicare Other | Admitting: Pharmacist

## 2021-09-25 DIAGNOSIS — E785 Hyperlipidemia, unspecified: Secondary | ICD-10-CM

## 2021-09-25 DIAGNOSIS — E118 Type 2 diabetes mellitus with unspecified complications: Secondary | ICD-10-CM

## 2021-09-25 DIAGNOSIS — N289 Disorder of kidney and ureter, unspecified: Secondary | ICD-10-CM

## 2021-09-25 DIAGNOSIS — E1169 Type 2 diabetes mellitus with other specified complication: Secondary | ICD-10-CM

## 2021-09-25 DIAGNOSIS — I10 Essential (primary) hypertension: Secondary | ICD-10-CM

## 2021-09-25 DIAGNOSIS — I25118 Atherosclerotic heart disease of native coronary artery with other forms of angina pectoris: Secondary | ICD-10-CM

## 2021-09-25 NOTE — Patient Instructions (Addendum)
Mr. Evan Wright and family,  ?It was a pleasure speaking with you today.  ?I have attached a summary of our visit today and information about your health goals. (See below)  ? ?Patient Goals/Self-Care Activities ?take medications as prescribed,  ?check glucose 1 to 2 times per week, document, and provide at future appointments,  ?collaborate with provider on medication access solutions ?Continue to work with home health with occupation and physical therapy ?Hold alendronate until further notice.  ?Follow up with nephrologist / [name] as planned 10/15/2021 (mention if they feel [name] or [name] might be appropriate medications for kidneys, heart and blood glucose)  ? ?Our next appointment is by telephone on October 24, 2021 at 9:00am ? ?Please call the care guide team at [phone] if you need to cancel or reschedule your appointment.  ? ?If you have any questions or concerns, please feel free to contact me either at the phone number below or with a MyChart message.  ? ? ?[name], PharmD ?Clinical Pharmacist ?[name] ?[name] ?[phone] (direct line)  ?[phone] (main office number) ? ? ? ?Pharmacy Care Plan for Chronic Care Management ?Current Barriers:  ?Unable to independently monitor therapeutic efficacy ?Unable to self administer medications as prescribed ?Recent changes in renal function - need to review medication list for adjustments  ? ?Pharmacist Clinical Goal(s):  ?Over the next 60 days, patient will achieve adherence to monitoring guidelines and medication adherence to achieve therapeutic efficacy ?maintain control of type 2 diabetes as evidenced by A1c < 8.0% (given patient's age and comorbid conditions)  ?Review medication list and need for medication adjustments based on renal function through collaboration with PharmD and provider.  ? ?Interventions: ?1:1 collaboration with Carollee Herter, Alferd Apa, DO regarding development and update of comprehensive plan of  care as evidenced by provider attestation and co-signature ?Inter-disciplinary care team collaboration (see longitudinal plan of care) ?Comprehensive medication review performed; medication list updated in electronic medical record ? ?Diabetes: ?Controlled; Last A1c was 7.1% (given patient's age,  would recommended A1c < 8.0%) ?current treatment: ?None currently ?Interventions:  ?Reviewed A1c goal ?Reviewed home blood glucose goals  ?Fasting blood glucose goal (before meals) = 80 to 130 ?Blood glucose goal after a meal = less than 180  ?Check blood glucose 1 or 2 times per week ?Reminded to get annual eye exam ? ?Hypertension: ?Variable blood pressure goal < 140/90 ?BP Readings from Last 3 Encounters:  ?07/31/21 (!) 174/84  ?06/26/21 124/80  ?03/13/21 (!) 182/80  ? ?Current treatment: ?Carvedilol 3.'125mg'$  twice a day ?Interventions:  ?Discussed blood pressure goal with family ?Continue current therapy for blood pressure  ?Check blood pressure at home 2 to 3 times per week and record (home health can check)  ? ?Hyperlipidemia / heart disease: ?Controlled; LDL goal <70 ?Lab Results  ?Component Value Date  ? [place] 33 06/26/2021  ? ?Current treatment: ?Atorvastatin '80mg'$  daily  ?Aspirin '81mg'$  daily  ?Ranolazine '500mg'$  once a day ?Interventions:  ?Discussed importance of atorvastatin in prevention of secondary heart disease.  ?Education provided about LDL goal ?Recommended continue current therapy. Recheck lipids in 3 to 6 months. If LDL > 70 then consider restarting ezetimibe.  ? ?Osteoporosis: ?Current treatment: ?None - holding Alendronate '70mg'$  weekly ?Interventions:  ?Recommended hold alendronate since kidney function has changed ? ? ?Patient Goals/Self-Care Activities ?Over the next 60 days, patient will:  ?take medications as prescribed,  ?check glucose 1 to 2 times per week, document, and provide at future appointments,  ?collaborate  with provider on medication access solutions ?Continue to work with home health  with occupation and physical therapy ?Hold alendronate until further notice.  ?Follow up with nephrologist / [name] as planned 10/15/2021 (mention if they feel [name] or [name] might be appropriate medications for kidneys, heart and blood glucose)  ? ? ? ?care plan provided today  in MyChart. Active MyChart status confirmed with patient.    ?

## 2021-09-25 NOTE — Chronic Care Management (AMB) (Signed)
Chronic Care Management Pharmacy Note  09/25/2021 Name:  Evan Wright MRN:  [id number] DOB:  [birth year]  Summary: Noted decrease in renal function over the last year. Recent Scr was 1.8 / eGFR 35 and estimated creatinine clearance 17 mL/min.  Patient has just returned from SNF stay. Home health is coming out for physical and occupational therapy and nurse care for catheter. Patient will have bladder study 3/3 and sees urology 10/08/21. Sees cardiologist 10/17/2021 and nephrologist 10/15/2021.   Last A1c was 7.1% but metformin was stopped due to diarrhea/ patient is not checking blood glucose at home currently.   Recommendations/Plan: Recommend holding alendronate due to recent decrease in Creatinine clearance (usually not recommended when CrCL is < 29m/ min) Continue to hold metformin - recommended family check blood glucose 1 to 2 times per week.  Ok to stay off ezetimibe. Recheck lipids in 3 to 6 months. Consider restarting ezetimibe if LDL >70.   Subjective: Evan TEELis an 88y.o. year old male who is a primary patient of Evan Held Evan Wright.  The CCM team was consulted for assistance with disease management and care coordination needs.    Engaged with patient's daughter, Evan Wright by telephone for follow up visit in response to provider referral for pharmacy case management and/or care coordination services.   Consent to Services:  The patient was given information about Chronic Care Management services, agreed to services, and gave verbal consent prior to initiation of services.  Please see initial visit note for detailed documentation.   Patient Care Team: Evan Wright Evan Apa Evan Wright as PCP - General Evan Cantor MD as Consulting Physician (Ophthalmology) TCarolan Clines MD (Inactive) as Consulting Physician (Urology) Evan Sprang MD as Consulting Physician (Neurology) Evan Wright RSt. Wright(Pharmacist)  Recent office visits: 09/07/2021 - [facility]am [facility] - Phone  Call. Started cephalexin 5021mbid for 7 days for UTI.  06/26/21-Evan R. ChCarollee HerterDO (PCP) Seen for annual exam visit. Labs ordered. Stop Metformin due to diarrhea. Follow up in 6 months.   Recent consult visits:  09/24/2021 - Podiatry (Dr GaElisha PonderSeen for routine foot care.  07/02/21-(Podiatry) Evan BoardDPM. Seen for diabetic foot exam. Follow up in 3 months. 04/02/21-(Podiatry) Evan BoardDPM. Seen for nail problem. Follow up in 3 months. 03/13/21-(Cardiology) RaReita ClicheRevankar, MD. Seen for general cardiac follow up visit. Labs ordered. EKG completed. Follow up in 6 months.    Hospital visits:  07/31/2021 thru 08/25/2021 [facility] (Exetimibe no longer on medication list after discharge from SNF. [facility]. Discharged with Foley cath. New medications started: Levaquin 50014mvery OTHER day thru 08/01/2021; mention of Miralax every other day to prevent constipation.    03/08/2021 - ED Visit for fall [facility] None noted Medication Changes at Hospital Discharge: -Changed None noted Medications Discontinued at Hospital Discharge: -Stopped None noted    Objective:  Lab Results  Component Value Date   CREATININE 1.8 (A) 09/12/2021   CREATININE 1.65 (H) 07/31/2021   CREATININE 1.64 (H) 07/30/2021    Lab Results  Component Value Date   HGBA1C 7.1 (H) 06/26/2021   Last diabetic Eye exam: No results found for: HMDIABEYEEXA  Last diabetic Foot exam: No results found for: HMDIABFOOTEX      Component Value Date/Time   CHOL 101 06/26/2021 1145   CHOL  122 04/26/2021 1048   TRIG 130.0 06/26/2021 1145   HDL 42.20 06/26/2021 1145   HDL 45 04/26/2021 1048   CHOLHDL 2 06/26/2021 1145   VLDL 26.0 06/26/2021 1145   LDLCALC 33 06/26/2021 1145    LDLCALC 56 04/26/2021 1048   LDLDIRECT 165.3 10/08/2010 1008    Hepatic Function Latest Ref Rng & Units 09/12/2021 07/30/2021 07/28/2021  Total Protein 6.5 - 8.1 g/dL - 5.7(L) 5.9(L)  Albumin 3.5 - 5.0 3.4(A) 2.5(L) 2.6(L)  AST 15 - 41 U/L - 18 34  ALT 0 - 44 U/L - 21 27  Alk Phosphatase 38 - 126 U/L - 62 56  Total Bilirubin 0.3 - 1.2 mg/dL - 0.6 0.6  Bilirubin, Direct 0.00 - 0.40 mg/dL - - -    Lab Results  Component Value Date/Time   TSH 1.95 06/26/2021 11:45 AM   TSH 4.310 03/13/2021 01:47 PM    CBC Latest Ref Rng & Units 09/12/2021 07/31/2021 07/29/2021  WBC - 5.8 9.8 9.1  Hemoglobin 13.5 - 17.5 11.7(A) 11.8(L) 11.4(L)  Hematocrit 41 - 53 36(A) 37.1(L) 36.6(L)  Platelets 150 - 399 189 235 227    Lab Results  Component Value Date/Time   VD25OH 24.39 (L) 06/02/2020 11:17 AM   VD25OH 35 07/12/2010 08:35 PM    Clinical ASCVD: Yes  The ASCVD Risk score (Arnett DK, et al., 2019) failed to calculate for the following reasons:   The 2019 ASCVD risk score is only valid for ages 5 to 49    Other:  DEXA DualFemur Total Mean 09/05/2020 94.0 N/A -1.9 0.767 g/cm2 DualFemur Total Mean 07/01/2018 91.8 N/A -1.7 0.792 g/cm2 DualFemur Total Mean 06/11/2016 89.7 N/A -1.7 0.791 g/cm2   Left Forearm Radius 33% 09/05/2020 94.0 N/A -3.3 0.662 g/cm2 Left Forearm Radius 33% 07/01/2018 91.8 N/A -2.9 0.704 g/cm2 Left Forearm Radius 33% 06/11/2016 89.7 N/A -3.0 0.697 g/cm2  Social History   Tobacco Use  Smoking Status Never  Smokeless Tobacco Never   BP Readings from Last 3 Encounters:  07/31/21 (!) 174/84  06/26/21 124/80  03/13/21 (!) 182/80   Pulse Readings from Last 3 Encounters:  07/31/21 70  06/26/21 67  03/13/21 67   Wt Readings from Last 3 Encounters:  07/29/21 162 lb 0.6 oz (73.5 kg)  06/26/21 151 lb 6.4 oz (68.7 kg)  03/13/21 165 lb 1.3 oz (74.9 kg)    Assessment: Review of patient past medical history, allergies, medications, health status, including review of  consultants reports, laboratory and other test data, was performed as part of comprehensive evaluation and provision of chronic care management services.   SDOH:  (Social Determinants of Health) assessments and interventions performed:     CCM Care Plan  Allergies  Allergen Reactions   Penicillins Other (See Comments)    Unknown reaction Did it involve swelling of the face/tongue/throat, SOB, or low BP? Unknown Did it involve sudden or severe rash/hives, skin peeling, or any reaction on the inside of your mouth or nose? Unknown Did you need to seek medical attention at a hospital or doctor's office? Unknown When did it last happen?    1940   If all above answers are NO, may proceed with cephalosporin use.    Medications Reviewed Today     Reviewed by Evan Wright, RPH-CPP (Pharmacist) on 09/25/21 at [place] List Status: <None>   Medication Order Taking? Sig Documenting Provider Last Dose Status Informant  acetaminophen (TYLENOL) 500 MG tablet [id number] Yes Take 1,000 mg by mouth every  6 (six) hours as needed for mild pain. [provider] Taking Active Child  alendronate (FOSAMAX) 70 MG tablet [id number] No Take 1 tablet (70 mg total) by mouth every 7 (seven) days. Take with a full glass of water on an empty stomach.  Patient not taking: Reported on 09/25/2021   [name], Evan Wright Not Taking Active Child           Med Note [facility] [place] Sep 25, 2021  9:19 AM) Holding due to renal function  aspirin EC 81 MG tablet [id number] Yes Take 1 tablet (81 mg total) by mouth daily at 12 noon. Modena Jansky, MD Taking Active Child  atorvastatin (LIPITOR) 80 MG tablet [id number] Yes TAKE ONE TABLET (80MG TOTAL) BY MOUTH DAILY Park Liter, MD Taking Active Child  Calcium Carb-Cholecalciferol (CALCIUM+D3 PO) [id number] Yes Take 500 mg by mouth daily at 12 noon. [provider] Taking Active Child  carboxymethylcellulose (REFRESH PLUS) 0.5 % SOLN [id number]  Yes 1 drop daily as needed (dry eyes). [provider] Taking Active Child  carvedilol (COREG) 3.125 MG tablet [id number] Yes TAKE ONE TABLET (3.125MG TOTAL) BY MOUTHTWICE DAILY WITH A MEAL Lowne Koren Shiver, Evan Wright Taking Active Child  lansoprazole (PREVACID) 30 MG capsule [id number] Yes TAKE ONE CAPSULE (30MG TOTAL) BY MOUTH TWICE DAILY BEFORE A MEAL Roma Schanz R, Evan Wright Taking Active   levothyroxine (SYNTHROID) 100 MCG tablet [id number] Yes TAKE ONE TABLET (100MCG TOTAL) BY MOUTH DAILY BEFORE BREAKFAST. *NEED OFFICE VISIT* [name], Evan Wright Taking Active   magnesium oxide (MAG-OX) 400 MG tablet [id number] Yes Take 400 mg by mouth daily. [provider] Taking Active   Multiple Vitamins-Minerals (PRESERVISION AREDS 2) CAPS [id number] Yes Take 1 capsule by mouth daily at 12 noon. [provider] Taking Active Child  nitroGLYCERIN (NITROSTAT) 0.4 MG SL tablet [id number] Yes Place 0.4 mg under the tongue every 5 (five) minutes as needed for chest pain. [provider] Taking Active Child  ranolazine (RANEXA) 500 MG 12 hr tablet [id number] Yes TAKE ONE TABLET (500 MG TOTAL) BY MOUTH ONCE DAILY Revankar, Reita Cliche, MD Taking Active Child  Vitamin D, Ergocalciferol, (DRISDOL) 1.25 MG (50000 UNIT) CAPS capsule [id number] Yes TAKE ONE CAPSULE (50000 UNITS TOTAL) BY MOUTH EVERY 7 DAYS [name], Evan Wright Taking Active Child            Patient Active Problem List   Diagnosis Date Noted   PNA (pneumonia) 07/27/2021   Fecal impaction ([place]) 07/26/2021   Acute kidney injury superimposed on CKD ([place]) 07/26/2021   Urinary retention 07/26/2021   Frequent falls 07/26/2021   Hypokalemia 07/26/2021   Right lower lobe pneumonia 07/26/2021   Traumatic subdural hemorrhage with loss of consciousness of unspecified duration, initial encounter ([place]) 06/26/2021   Diarrhea 06/26/2021   Moderate aortic regurgitation 03/13/2021   Ascending aortic aneurysm 03/13/2021    Pedal edema 03/13/2021   Adenomatous colon polyp    CAD (coronary artery disease)    Cardiac murmur    Coronary artery disease    Diverticulosis    GERD (gastroesophageal reflux disease)    History of hiatal hernia    Hyperlipidemia associated with type 2 diabetes mellitus (HCC)    Prostate cancer ([place])    Osteoarthritis    Intermittent claudication ([place]) 06/02/2020   Slow transit constipation 08/10/2019   SDH (subdural hematoma) 09/28/2018   Seizure-like activity ([place]) 09/28/2018   Right arm weakness 09/28/2018  Preventative health care 09/23/2018   Type 2 diabetes mellitus ([place]) 09/23/2018   Osteopenia 09/25/2017   Hypertension associated with diabetes ([place]) 09/25/2017   Hyperglycemia 09/25/2017   Hypothyroidism 09/25/2017   CAD (coronary artery disease), native coronary artery 04/29/2017   Nonsustained ventricular tachycardia 04/24/2017   Angina pectoris ([place]) 04/2017   Ischemic cardiomyopathy 04/10/2017   Mixed dyslipidemia 04/10/2017   Abnormal EKG 04/02/2017   Idiopathic peripheral neuropathy 10/11/2016   Proximal leg weakness 10/11/2016   Weakness of both lower extremities 06/11/2016   Loss of height 06/11/2016   De Quervain's tenosynovitis 05/18/2012   Family history of malignant neoplasm of gastrointestinal tract 05/14/2011   NEVI, MULTIPLE 07/12/2010   OSTEOARTHRITIS 07/12/2010   UNSPECIFIED VITAMIN D DEFICIENCY 02/11/2008   HIATAL HERNIA WITH REFLUX 02/11/2008   GASTROENTERITIS 01/20/2008   PERSONAL HISTORY OF COLONIC POLYPS 11/17/2007   DIZZINESS 10/15/2007   PROSTATE CANCER, HX OF 10/15/2007    Immunization History  Administered Date(s) Administered   Fluad Quad(high Dose 65+) 04/30/2019, 06/02/2020, 06/26/2021   Influenza Split 05/18/2012, 08/06/2014   Influenza Whole 05/09/2010   Influenza, High Dose Seasonal PF 06/11/2016, 05/02/2017, 04/13/2018   Influenza-Unspecified 05/22/2013   PFIZER(Purple Top)SARS-COV-2 Vaccination 08/26/2019, 09/16/2019,  06/09/2020   Pneumococcal Conjugate-13 08/06/2014   Pneumococcal Polysaccharide-23 07/03/2005    Conditions to be addressed/monitored: CAD, HTN, HLD, DMII, CKD Stage 3b, GERD, Hypothyroidism, and Osteoporosis; aortic aneurysm; ischemic cardiomyopathy; neuropathy  Care Plan : General Pharmacy (Adult)  Updates made by Evan Wright, RPH-CPP since 09/25/2021 12:00 AM     Problem: Chronic conditions: type 2 DM; HTN; HDL; CAD; ischemic cardiomyopathy; aortic aneurysm; CKD - 3b; osteoporosis; hypothyroidism; GERD   Priority: High  Onset Date: 09/03/2021     Long-Range Goal: Provide education, support and care coordination for medication therapy and chronic conditions   Start Date: 09/03/2021  Priority: High  Note:   Current Barriers:  Unable to independently monitor therapeutic efficacy Unable to maintain control of Type 2 diabetes Unable to self administer medications as prescribed Recent changes in renal function - need to review medication list for adjustments   Pharmacist Clinical Goal(s):  Over the next 60 days, patient will achieve adherence to monitoring guidelines and medication adherence to achieve therapeutic efficacy maintain control of type 2 diabetes as evidenced by A1c < 8.0% (given patient's age and comorbid conditions)  Review medication list and need for medication adjustments based on renal function  through collaboration with PharmD and provider.   Interventions: 1:1 collaboration with Carollee Wright, Evan Apa, Evan Wright regarding development and update of comprehensive plan of care as evidenced by provider attestation and co-signature Inter-disciplinary care team collaboration (see longitudinal plan of care) Comprehensive medication review performed; medication list updated in electronic medical record  Diabetes: Controlled; Last A1c was 7.1% (give patient's age and co-morbid conditions would recommended A1c < 8.0%) current treatment: None currently Metformin stopped in  December 2023 due to diarrhea Current glucose readings:checked 1 or 2 times per week by home health nurse. Daughter only knew last reading that was 183 (09/19/2021) Last Scr was 1.8 and estimated eGFR was 35 (from labs 09/12/2021) Noted to have cardiomyopathy. Last EF was 55 to 60% (02/11/2019) Interventions:  Educated about goal A1c Reviewed home blood glucose goals  Fasting blood glucose goal (before meals) = 80 to 130 Blood glucose goal after a meal = less than 180  Continue to check blood glucose 1 or 2 times per week and record If pharmacotherapy is started could consider SGLT2 due to  decreased renal function though at lower eGFR SGLTs have less blood glucose lowering effect.  Patient has [facility] with [facility] 10/15/2021 and cardiologist 10/17/2021 [name]:  If estimated glomerular filtration rate (eGFR) is 25 mL/min/1.73 m(2) or greater: 10 mg orally once daily, but improvements in glycemic control likely to be ineffective if eGFR is less than 45 mL/min/1.73 m(2).  [place]: Estimated GFR 20 mL/min/1.73 m(2) or greater: 10 mg orally once daily may have a beneficial effect on cardiovascular health and slow the rate of kidney function decline; Glycemic Control in Patients Without Established Cardiovascular Disease or Cardiovascular Risk Factors. Estimated GFR less than 30 mL/min/1.73 m(2): Use not recommended  Hypertension: Controlled; blood pressure goal < 140/90 Current treatment: Carvedilol 3.166m twice a day Current home readings: daughter did not have specific readings but reported that home health nurse checks 1 to 2 times per week and "blood pressure has been good" Denies hypotensive/hypertensive symptoms Interventions:  Discussed blood pressure goal with family Continue current therapy for blood pressure   Hyperlipidemia / CAD: Controlled; LDL goal <70 Current treatment: Atorvastatin 811mdaily  Aspirin 8161maily  Ranolazine 500m66mce a  day Patient had taken ezetimibe 10mg5mly in past but per family they were told when he left SNF that ezetimibe was not needed.  Last LDL was 33 (when he was taking both atorvastatin and ezetimibe)  Interventions:  Discussed importance of atorvastatin in prevention of secondary heart diseases.  Education provided about LDL goal Recommended continue current therapy. Recheck lipids around May or June 2023. If LDL > 70 then consider restarting ezetimibe.   Osteoporosis: Current treatment: Alendronate 70mg 38mly (currently on hold due to decrease in renal function)  Last DEXA:  DualFemur Total Mean 09/05/2020 -1.9  DualFemur Total Mean 07/01/2018  -1.7  DualFemur Total Mean 06/11/2016 -1.7    Left Forearm Radius 33% 09/05/2020  -3.3  Left Forearm Radius 33% 07/01/2018 -2.9  Left Forearm Radius 33% 06/11/2016  -3.0  Last Scr = 1.65 and estimate GFR was 38; Estimated creatinine Clearance = 18 mL/min Interventions:  Continue to hold alendronate since CrCl is less than 35mL/m49m Chronic Kidney Disease - 3B Last Scr = 1.8 and estimate GFR was 35; Estimated creatinine Clearance = 17 mL/min Interventions:  Continue to hold alendronate since CrCl is less than 35mL/mi41mllow up with [place] planned 10/15/2021  Patient Goals/Self-Care Activities Over the next 60 days, patient will:  take medications as prescribed,  check glucose 1 to 2 times per week, document, and provide at future appointments,  collaborate with provider on medication access solutions Continue to work with home health with occupation and physical therapy Follow up with nephrologist / [place] planned 10/15/2021 (mention if they feel Farxiga Wilder GladeiancVania Reae appropriate medications for kidneys, heart and blood glucose)   Follow Up Plan: Telephone follow up appointment with care management team member scheduled for:  3 to 4 weeks        Medication Assistance: None required.  Patient affirms  current coverage meets needs.  [place]:  [place] [phone] [id number]-[place]ene [phone] #[id number] - [id number] [phone]  [facility] [address], [place] [address] Phone: [phone] Fax: [phone]  Uses pill box? Yes Daughter assist with medication management  Follow Up:  Patient agrees to Care Plan and Follow-up.  Plan: Telephone follow up appointment with care management team member scheduled for:  4 weeks  Evan Wright, PharmD Clinical Pharmacist [facility]

## 2021-09-26 DIAGNOSIS — N390 Urinary tract infection, site not specified: Secondary | ICD-10-CM | POA: Diagnosis not present

## 2021-09-26 DIAGNOSIS — I252 Old myocardial infarction: Secondary | ICD-10-CM | POA: Diagnosis not present

## 2021-09-26 DIAGNOSIS — D631 Anemia in chronic kidney disease: Secondary | ICD-10-CM | POA: Diagnosis not present

## 2021-09-26 DIAGNOSIS — E1140 Type 2 diabetes mellitus with diabetic neuropathy, unspecified: Secondary | ICD-10-CM | POA: Diagnosis not present

## 2021-09-26 DIAGNOSIS — I714 Abdominal aortic aneurysm, without rupture, unspecified: Secondary | ICD-10-CM | POA: Diagnosis not present

## 2021-09-26 DIAGNOSIS — J690 Pneumonitis due to inhalation of food and vomit: Secondary | ICD-10-CM | POA: Diagnosis not present

## 2021-09-26 DIAGNOSIS — N189 Chronic kidney disease, unspecified: Secondary | ICD-10-CM | POA: Diagnosis not present

## 2021-09-26 DIAGNOSIS — I255 Ischemic cardiomyopathy: Secondary | ICD-10-CM | POA: Diagnosis not present

## 2021-09-26 DIAGNOSIS — I129 Hypertensive chronic kidney disease with stage 1 through stage 4 chronic kidney disease, or unspecified chronic kidney disease: Secondary | ICD-10-CM | POA: Diagnosis not present

## 2021-09-26 DIAGNOSIS — I25119 Atherosclerotic heart disease of native coronary artery with unspecified angina pectoris: Secondary | ICD-10-CM | POA: Diagnosis not present

## 2021-09-26 DIAGNOSIS — R339 Retention of urine, unspecified: Secondary | ICD-10-CM | POA: Diagnosis not present

## 2021-09-26 DIAGNOSIS — E039 Hypothyroidism, unspecified: Secondary | ICD-10-CM | POA: Diagnosis not present

## 2021-09-26 DIAGNOSIS — E1122 Type 2 diabetes mellitus with diabetic chronic kidney disease: Secondary | ICD-10-CM | POA: Diagnosis not present

## 2021-09-26 DIAGNOSIS — I351 Nonrheumatic aortic (valve) insufficiency: Secondary | ICD-10-CM | POA: Diagnosis not present

## 2021-09-26 DIAGNOSIS — N179 Acute kidney failure, unspecified: Secondary | ICD-10-CM | POA: Diagnosis not present

## 2021-09-26 DIAGNOSIS — E876 Hypokalemia: Secondary | ICD-10-CM | POA: Diagnosis not present

## 2021-09-30 NOTE — Progress Notes (Signed)
?  Subjective:  ?Patient ID: Evan Wright, male    DOB: 03/22/1925,  MRN: [id number] ? ?Evan Wright presents to clinic today for at risk foot care with history of diabetic neuropathy and painful elongated mycotic toenails 1-5 bilaterally which are tender when wearing enclosed shoe gear. Pain is relieved with periodic professional debridement. ? ?He is accompanied by his daughter on today's visit. ? ?New problem(s): None.  ? ?PCP is Carollee Herter, Alferd Apa, DO , and last visit was June 26, 2021. ? ?Allergies  ?Allergen Reactions  ? Penicillins Other (See Comments)  ?  Unknown reaction ?Did it involve swelling of the face/tongue/throat, SOB, or low BP? Unknown ?Did it involve sudden or severe rash/hives, skin peeling, or any reaction on the inside of your mouth or nose? Unknown ?Did you need to seek medical attention at a hospital or doctor's office? Unknown ?When did it last happen?    1940   ?If all above answers are ?NO?, may proceed with cephalosporin use.  ? ? ?Review of Systems: Negative except as noted in the HPI. ? ?Objective: ?LANI MENDIOLA is a pleasant 88 y.o. male in NAD. AAO X 3. ? ?Vascular Examination: ?CFT <3 seconds b/l LE. Palpable PT pulse(s) b/l LE. Faintly palpable DP pulses b/l LE. Pedal hair sparse. No pain with calf compression b/l. No edema noted b/l LE. No cyanosis or clubbing noted b/l LE. ? ?Dermatological Examination: ?No open wounds b/l LE. No interdigital macerations noted b/l LE. Toenails 1-5 b/l elongated, discolored, dystrophic, thickened, crumbly with subungual debris and tenderness to dorsal palpation. Pedal skin noted to be dry b/l lower extremities. ? ?Musculoskeletal Examination: ?Muscle strength 5/5 to all lower extremity muscle groups bilaterally. HAV with bunion deformity noted b/l LE. Hammertoe deformity noted 2-5 b/l. Utilizes wheelchair for mobility assistance.  ? ?Neurological Examination: ?Protective sensation decreased with 10 gram monofilament b/l. ? ?Hemoglobin A1C  Latest Ref Rng & Units 06/26/2021  ?HGBA1C 4.6 - 6.5 % 7.1(H)  ?Some recent data might be hidden  ?Assessment/Plan: ?1. Pain due to onychomycosis of toenails of both feet   ?2. Diabetic peripheral neuropathy associated with type 2 diabetes mellitus ([place])   ?-Patient was evaluated and treated. All patient's and/or POA's questions/concerns answered on today's visit. ?-Consent given for treatment as described below: ?-Patient to continue soft, supportive shoe gear daily. ?-Toenails 1-5 b/l were debrided in length and girth with sterile nail nippers and dremel without iatrogenic bleeding.  ?-Patient/POA to call should there be question/concern in the interim.  ? ?Return in about 3 months (around 12/25/2021). ? ?Marzetta Board, DPM  ?

## 2021-10-08 DIAGNOSIS — R338 Other retention of urine: Secondary | ICD-10-CM | POA: Diagnosis not present

## 2021-10-08 DIAGNOSIS — N319 Neuromuscular dysfunction of bladder, unspecified: Secondary | ICD-10-CM | POA: Diagnosis not present

## 2021-10-09 ENCOUNTER — Encounter: Payer: Self-pay | Admitting: Family Medicine

## 2021-10-12 ENCOUNTER — Other Ambulatory Visit: Payer: Self-pay | Admitting: Family Medicine

## 2021-10-15 DIAGNOSIS — R338 Other retention of urine: Secondary | ICD-10-CM | POA: Diagnosis not present

## 2021-10-15 DIAGNOSIS — N319 Neuromuscular dysfunction of bladder, unspecified: Secondary | ICD-10-CM | POA: Diagnosis not present

## 2021-10-15 DIAGNOSIS — R310 Gross hematuria: Secondary | ICD-10-CM | POA: Diagnosis not present

## 2021-10-16 ENCOUNTER — Other Ambulatory Visit: Payer: Self-pay

## 2021-10-16 DIAGNOSIS — N1832 Chronic kidney disease, stage 3b: Secondary | ICD-10-CM | POA: Diagnosis not present

## 2021-10-16 DIAGNOSIS — N139 Obstructive and reflux uropathy, unspecified: Secondary | ICD-10-CM | POA: Diagnosis not present

## 2021-10-16 DIAGNOSIS — E1122 Type 2 diabetes mellitus with diabetic chronic kidney disease: Secondary | ICD-10-CM | POA: Diagnosis not present

## 2021-10-16 DIAGNOSIS — I129 Hypertensive chronic kidney disease with stage 1 through stage 4 chronic kidney disease, or unspecified chronic kidney disease: Secondary | ICD-10-CM | POA: Diagnosis not present

## 2021-10-17 ENCOUNTER — Encounter: Payer: Self-pay | Admitting: Cardiology

## 2021-10-17 ENCOUNTER — Other Ambulatory Visit: Payer: Self-pay

## 2021-10-17 ENCOUNTER — Ambulatory Visit: Payer: Medicare Other | Admitting: Cardiology

## 2021-10-17 VITALS — BP 130/60 | HR 72 | Ht 71.0 in | Wt 143.0 lb

## 2021-10-17 DIAGNOSIS — E1169 Type 2 diabetes mellitus with other specified complication: Secondary | ICD-10-CM | POA: Diagnosis not present

## 2021-10-17 DIAGNOSIS — I2510 Atherosclerotic heart disease of native coronary artery without angina pectoris: Secondary | ICD-10-CM | POA: Diagnosis not present

## 2021-10-17 DIAGNOSIS — I10 Essential (primary) hypertension: Secondary | ICD-10-CM

## 2021-10-17 DIAGNOSIS — E782 Mixed hyperlipidemia: Secondary | ICD-10-CM

## 2021-10-17 DIAGNOSIS — I351 Nonrheumatic aortic (valve) insufficiency: Secondary | ICD-10-CM

## 2021-10-17 DIAGNOSIS — I7121 Aneurysm of the ascending aorta, without rupture: Secondary | ICD-10-CM | POA: Diagnosis not present

## 2021-10-17 DIAGNOSIS — E785 Hyperlipidemia, unspecified: Secondary | ICD-10-CM

## 2021-10-17 NOTE — Patient Instructions (Signed)

## 2021-10-17 NOTE — Progress Notes (Signed)
?Cardiology Office Note:   ? ?Date:  10/17/2021  ? ?ID:  Evan Wright, DOB 1924/08/05, MRN [id number] ? ?PCP:  [name], DO  ?Cardiologist:  Jenean Lindau, MD  ? ?Referring MD: Carollee Herter, Alferd Apa, *  ? ? ?ASSESSMENT:   ? ?1. Aneurysm of ascending aorta without rupture   ?2. Coronary artery disease involving native coronary artery of native heart without angina pectoris   ?3. Essential hypertension   ?4. Hyperlipidemia associated with type 2 diabetes mellitus ([place])   ?5. Moderate aortic regurgitation   ?6. Mixed dyslipidemia   ? ?PLAN:   ? ?In order of problems listed above: ? ?Coronary artery disease: Secondary prevention stressed with the patient.  Importance of compliance with diet and medication stressed and she vocalized understanding. ?Ascending aortic aneurysm: Stable at this time.  Patient not keen on further evaluation because of his age and advanced comorbidities.  I respect his wishes. ?Essential hypertension: Blood pressure stable and diet was emphasized. ?Mixed dyslipidemia: On statin therapy.  Lipids reviewed and discussed with the patient.  They are stable. ?Patient will be seen in follow-up appointment in 9 months or earlier if the patient has any concerns ? ? ? ?Medication Adjustments/Labs and Tests Ordered: ?Current medicines are reviewed at length with the patient today.  Concerns regarding medicines are outlined above.  ?No orders of the defined types were placed in this encounter. ? ?No orders of the defined types were placed in this encounter. ? ? ? ?No chief complaint on file. ?  ? ?History of Present Illness:   ? ?Evan Wright is a 88 y.o. male.  Patient has past medical history of coronary artery disease, ascending aortic aneurysm, essential hypertension dyslipidemia.  He denies any problems at this time and takes care of activities of daily living.  He ambulates age appropriately.  Has not been very stable with his diet because of generalized weakness.  His daughter  accompanies him for this visit and his family is very supportive.  At the time of my evaluation, the patient is alert awake oriented and in no distress. ? ?Past Medical History:  ?Diagnosis Date  ? Abnormal EKG 04/02/2017  ? Acute kidney injury superimposed on CKD ([place]) 07/26/2021  ? Adenomatous colon polyp   ? Angina pectoris ([place]) 04/2017  ? Ascending aortic aneurysm 03/13/2021  ? CAD (coronary artery disease)   ? a. Cath 04/29/17-> high grade pLAD with R to L collaterals, high grade 1st dig  2. Cath 05/01/17 s/p ostial to mLAD with DES x 2.   ? CAD (coronary artery disease), native coronary artery 04/29/2017  ? Cardiac murmur   ? Coronary artery disease   ? De Quervain's tenosynovitis 05/18/2012  ? Diarrhea 06/26/2021  ? Diverticulosis   ? DIZZINESS 10/15/2007  ? Qualifier: Diagnosis of  By: [name]    ? Essential hypertension 09/25/2017  ? Family history of malignant neoplasm of gastrointestinal tract 05/14/2011  ? Brother with colon cancer   ? Fecal impaction ([place]) 07/26/2021  ? Frequent falls 07/26/2021  ? GASTROENTERITIS 01/20/2008  ? Qualifier: Diagnosis of  By: [name], [name]    ? GERD (gastroesophageal reflux disease)   ? HIATAL HERNIA WITH REFLUX 02/11/2008  ? Qualifier: Diagnosis of  By: [name]    ? History of hiatal hernia   ? Hyperglycemia 09/25/2017  ? Hyperlipidemia associated with type 2 diabetes mellitus ([place])   ? Hypertension associated with diabetes ([place]) 09/25/2017  ?  Hypokalemia 07/26/2021  ? Hypothyroidism 09/25/2017  ? Idiopathic peripheral neuropathy 10/11/2016  ? Intermittent claudication ([place]) 06/02/2020  ? Ischemic cardiomyopathy 04/10/2017  ? Loss of height 06/11/2016  ? Mixed dyslipidemia 04/10/2017  ? Moderate aortic regurgitation 03/13/2021  ? NEVI, MULTIPLE 07/12/2010  ? Qualifier: Diagnosis of  By: [name]    ? Nonsustained ventricular tachycardia 04/24/2017  ? Osteoarthritis   ? OSTEOARTHRITIS 07/12/2010  ? Qualifier: Diagnosis of  By: [name]    ?  Osteopenia 09/25/2017  ? Pedal edema 03/13/2021  ? Personal history of colonic polyps 11/17/2007  ? Qualifier: Diagnosis of  By: [name], [name]   ? PNA (pneumonia) 07/27/2021  ? Preventative health care 09/23/2018  ? Prostate cancer ([place])   ? PROSTATE CANCER, HX OF 10/15/2007  ? Qualifier: Diagnosis of  By: [name]    ? Proximal leg weakness 10/11/2016  ? Right arm weakness 09/28/2018  ? Right lower lobe pneumonia 07/26/2021  ? SDH (subdural hematoma) 09/28/2018  ? Seizure-like activity ([place]) 09/28/2018  ? Slow transit constipation 08/10/2019  ? Traumatic subdural hemorrhage with loss of consciousness of unspecified duration, initial encounter ([place]) 06/26/2021  ? Type 2 diabetes mellitus ([place]) 09/23/2018  ? Uncontrolled diabetes mellitus with hyperglycemia ([place]) 09/23/2018  ? Unspecified vitamin D deficiency 02/11/2008  ? Qualifier: Diagnosis of  By: [name]    ? Urinary retention 07/26/2021  ? Weakness of both lower extremities 06/11/2016  ? ? ?Past Surgical History:  ?Procedure Laterality Date  ? APPENDECTOMY  1948  ? COLONOSCOPY    ? CORONARY STENT INTERVENTION N/A 05/01/2017  ? Procedure: CORONARY STENT INTERVENTION;  Surgeon: [name], [name], MD;  Location: [facility];  Service: Cardiovascular;  Laterality: N/A;  ? EYE SURGERY  2012  ? march and April  --Dr [name]  ? LEFT HEART CATH AND CORONARY ANGIOGRAPHY N/A 04/29/2017  ? Procedure: LEFT HEART CATH AND CORONARY ANGIOGRAPHY;  Surgeon: [name], MD;  Location: [facility];  Service: Cardiovascular;  Laterality: N/A;  ? PROSTATECTOMY    ? TONSILLECTOMY    ? ? ?Current Medications: ?Current Meds  ?Medication Sig  ? acetaminophen (TYLENOL) 500 MG tablet Take 1,000 mg by mouth every 6 (six) hours as needed for mild pain.  ? alendronate (FOSAMAX) 70 MG tablet Take 1 tablet (70 mg total) by mouth every 7 (seven) days. Take with a full glass of water on an empty stomach.  ? amLODipine (NORVASC) 2.5 MG tablet Take 2.5 mg by mouth daily.   ? aspirin EC 81 MG tablet Take 1 tablet (81 mg total) by mouth daily at 12 noon.  ? atorvastatin (LIPITOR) 80 MG tablet TAKE ONE TABLET ('80MG'$  TOTAL) BY MOUTH DAILY  ? Calcium Carb-Cholecalciferol (CALCIUM+D3 PO) Take 500 mg by mouth daily at 12 noon.  ? carboxymethylcellulose (REFRESH PLUS) 0.5 % SOLN 1 drop daily as needed (dry eyes).  ? carvedilol (COREG) 6.25 MG tablet Take 6.25 mg by mouth 2 (two) times daily.  ? lansoprazole (PREVACID) 30 MG capsule TAKE ONE CAPSULE ('30MG'$  TOTAL) BY MOUTH TWICE DAILY BEFORE A MEAL  ? levothyroxine (SYNTHROID) 100 MCG tablet Take 1 tablet (100 mcg total) by mouth daily before breakfast.  ? magnesium oxide (MAG-OX) 400 MG tablet Take 400 mg by mouth daily.  ? Multiple Vitamins-Minerals (PRESERVISION AREDS 2) CAPS Take 1 capsule by mouth daily at 12 noon.  ? nitroGLYCERIN (NITROSTAT) 0.4 MG SL tablet Place 0.4 mg under the tongue  every 5 (five) minutes as needed for chest pain.  ? ranolazine (RANEXA) 500 MG 12 hr tablet TAKE ONE TABLET (500 MG TOTAL) BY MOUTH ONCE DAILY  ? Vitamin D, Ergocalciferol, (DRISDOL) 1.25 MG (50000 UNIT) CAPS capsule TAKE ONE CAPSULE (50000 UNITS TOTAL) BY MOUTH EVERY 7 DAYS  ?  ? ?Allergies:   Penicillins  ? ?Social History  ? ?Socioeconomic History  ? Marital status: Married  ?  Spouse name: Not on file  ? Number of children: 2  ? Years of education: Not on file  ? Highest education level: Not on file  ?Occupational History  ? Occupation: retired  ?  Employer: RETIRED  ?Tobacco Use  ? Smoking status: Never  ? Smokeless tobacco: Never  ?Vaping Use  ? Vaping Use: Never used  ?Substance and Sexual Activity  ? Alcohol use: Never  ? Drug use: No  ? Sexual activity: Not on file  ?Other Topics Concern  ? Not on file  ?Social History Narrative  ? Right handed  ? Lives with family  ? ?Social Determinants of Health  ? ?Financial Resource Strain: Low Risk   ? Difficulty of Paying Living Expenses: Not hard at all  ?Food Insecurity: Not on file  ?Transportation  Needs: No Transportation Needs  ? Lack of Transportation (Medical): No  ? Lack of Transportation (Non-Medical): No  ?Physical Activity: Not on file  ?Stress: Not on file  ?Social Connections: Not on file  ?  ? ?Famil

## 2021-10-19 DIAGNOSIS — E118 Type 2 diabetes mellitus with unspecified complications: Secondary | ICD-10-CM

## 2021-10-19 DIAGNOSIS — I10 Essential (primary) hypertension: Secondary | ICD-10-CM | POA: Diagnosis not present

## 2021-10-19 DIAGNOSIS — I25118 Atherosclerotic heart disease of native coronary artery with other forms of angina pectoris: Secondary | ICD-10-CM

## 2021-10-19 DIAGNOSIS — E1169 Type 2 diabetes mellitus with other specified complication: Secondary | ICD-10-CM | POA: Diagnosis not present

## 2021-10-19 DIAGNOSIS — E785 Hyperlipidemia, unspecified: Secondary | ICD-10-CM

## 2021-10-20 ENCOUNTER — Other Ambulatory Visit: Payer: Self-pay | Admitting: Family Medicine

## 2021-10-24 ENCOUNTER — Ambulatory Visit (INDEPENDENT_AMBULATORY_CARE_PROVIDER_SITE_OTHER): Payer: Medicare Other | Admitting: Pharmacist

## 2021-10-24 DIAGNOSIS — E782 Mixed hyperlipidemia: Secondary | ICD-10-CM

## 2021-10-24 DIAGNOSIS — M8580 Other specified disorders of bone density and structure, unspecified site: Secondary | ICD-10-CM

## 2021-10-24 DIAGNOSIS — E1165 Type 2 diabetes mellitus with hyperglycemia: Secondary | ICD-10-CM

## 2021-10-24 DIAGNOSIS — I25118 Atherosclerotic heart disease of native coronary artery with other forms of angina pectoris: Secondary | ICD-10-CM

## 2021-10-24 DIAGNOSIS — N289 Disorder of kidney and ureter, unspecified: Secondary | ICD-10-CM

## 2021-10-24 DIAGNOSIS — I10 Essential (primary) hypertension: Secondary | ICD-10-CM

## 2021-10-24 NOTE — Chronic Care Management (AMB) (Signed)
? ? ?Chronic Care Management ?Pharmacy Note ? ?10/31/2021 ?Name:  Evan Wright MRN:  [id number] DOB:  10/30/1924 ? ?Summary: ?Patient has seen nephrology and urology regarding hematuria and changes in serum Wright. Nephrology is monitoring Scr and running more tests.  ?Blood glucose has been more elevated since metformin was stopped due to diarrhea and rising serum Wright. Last A1c was 7.1% before metformin was stopped.  ?[facility] coming out. Daughter reports it is difficult to get patient into office for appointments.  ?Patient's daughters help with medication administration.  ? ?Recommendations/Plan: ?Continue to hold alendronate due to recent decrease in Wright clearance (usually not recommended when CrCL is < 47m/ min) ?Continue off metformin. Will try to get home health to draw A1c.   ?Ok to stay off ezetimibe. Recheck lipids in 2 to 3 months. Consider restarting ezetimibe if LDL >70.  ? ?Subjective: ?Evan MANGIONEis an 88y.o. year old male who is a primary patient of Evan Wright.  The CCM team was consulted for assistance with disease management and care coordination needs.   ? ?Engaged with patient's daughter, CMalachy Moodby Wright for follow Wright visit in response to provider referral for pharmacy case management and/or care coordination services.  ? ?Consent to Services:  ?The patient was given information about Chronic Care Management services, agreed to services, and gave verbal consent prior to initiation of services.  Please see initial visit note for detailed documentation.  ? ?Patient Care Team: ?Evan Wright Evan Wright as PCP - General ?DCalvert Cantor Wright as Consulting Physician (Ophthalmology) ?Evan Wright (Inactive) as Consulting Physician (Urology) ?Evan Wright as Consulting Physician (Neurology) ?Evan Wright Wright (Pharmacist) ? ?Recent office visits: ?09/07/2021 - [facility] - Phone Call. Started cephalexin 5025mbid for 7 days for UTI.   ?06/26/21-Evan Wright (PCP) Seen for annual exam visit. Labs ordered. Stop Metformin due to diarrhea. Follow Wright in 6 months. ?  ?Recent consult visits:  ?10/22/2021 - Nephrology (Evan Wright. Checked labs - Scr was increased more. Repeat CMP in 2 weeks. Als onoted small M Spike, considering referral to hematology for evaluation. ?10/17/2021 - Cardio (Evan Wright aneurysm of ascending aorta and CAD. Disucssed secondary prevention. Aneurysm stable. No medication changes. F/U 9 months.  ?10/15/2021 - Urology. Seen for gross hematuria. . Thought to be related to indwelling catheter. Checked urine culture and sensitivity. F/U 1 month ?10/08/2021 - Urology. Seen for f/u urinary retention. Foley catherter placed during hospitaliztion to relieve urinary retention.  ?09/24/2021 - Podiatry (Evan Evan Wright for routine foot care.  ?07/02/21-(Podiatry) Evan Wright. Seen for diabetic foot exam. Follow Wright in 3 months. ? ?  ?Hospital visits:  ?07/31/2021 thru [facility]25/20[facility] (Exetimibe no longer on medication list after discharge from SNF. ?07/25/2021 to 07/31/2021 [facility]. Discharged with Foley cath. ?New medications started: Levaquin 50015mvery OTHER day thru 08/01/2021; mention of Miralax every other day to prevent constipation.  ?  ? ?Objective: ? ?Lab Results  ?Component Value Date  ? Wright 1.8 (A) 09/12/2021  ? Wright 1.65 (H) 07/31/2021  ? Wright 1.64 (H) 07/30/2021  ? ? ?Lab Results  ?Component Value Date  ? HGBA1C 7.1 (H) 06/26/2021  ? ?Last diabetic Eye exam: No results found for: HMDIABEYEEXA  ?Last diabetic Foot exam: No results  found for: HMDIABFOOTEX  ? ?   ?Component Value Date/Time  ? CHOL 101 06/26/2021 1145  ? CHOL 122 04/26/2021 1048  ? TRIG 130.0 06/26/2021 1145  ? HDL  42.20 06/26/2021 1145  ? HDL 45 04/26/2021 1048  ? CHOLHDL 2 06/26/2021 1145  ? VLDL 26.0 06/26/2021 1145  ? [place] 33 06/26/2021 1145  ? LDLCALC 56 04/26/2021 1048  ? LDLDIRECT 165.3 10/08/2010 1008  ? ? ? ?  Latest Ref Rng & Units 09/12/2021  ? 12:00 AM 07/30/2021  ?  2:16 PM 07/28/2021  ?  6:15 AM  ?Hepatic Function  ?Total Protein 6.5 - 8.1 g/dL  5.7   5.9    ?Albumin 3.5 - 5.0 3.4      2.5   2.6    ?AST 15 - 41 U/L  18   34    ?ALT 0 - 44 U/L  21   27    ?Alk Phosphatase 38 - 126 U/L  62   56    ?Total Bilirubin 0.3 - 1.2 mg/dL  0.6   0.6    ?  ? This result is from an external source.  ? ? ?Lab Results  ?Component Value Date/Time  ? TSH 1.95 06/26/2021 11:45 AM  ? TSH 4.310 03/13/2021 01:47 PM  ? ? ? ?  Latest Ref Rng & Units 09/12/2021  ? 12:00 AM 07/31/2021  ?  4:49 AM 07/29/2021  ?  6:09 AM  ?CBC  ?WBC  5.8      9.8   9.1    ?Hemoglobin 13.5 - 17.5 11.7      11.8   11.4    ?Hematocrit 41 - 53 36      37.1   36.6    ?Platelets 150 - 399 189      235   227    ?  ? This result is from an external source.  ? ? ?Lab Results  ?Component Value Date/Time  ? VD25OH 24.39 (L) 06/02/2020 11:17 AM  ? VD25OH 35 07/12/2010 08:35 PM  ? ? ?Clinical ASCVD: Yes  ?The ASCVD Risk score (Arnett DK, et al., 2019) failed to calculate for the following reasons: ?  The 2019 ASCVD risk score is only valid for ages 76 to 76   ? ?Other:  ?DEXA ?DualFemur Total Mean 09/05/2020 94.0 N/A -1.9 0.767 g/cm2 ?DualFemur Total Mean 07/01/2018 91.8 N/A -1.7 0.792 g/cm2 ?DualFemur Total Mean 06/11/2016 89.7 N/A -1.7 0.791 g/cm2 ?  ?Left Forearm Radius 33% 09/05/2020 94.0 N/A -3.3 0.662 g/cm2 ?Left Forearm Radius 33% 07/01/2018 91.8 N/A -2.9 0.704 g/cm2 ?Left Forearm Radius 33% 06/11/2016 89.7 N/A -3.0 0.697 g/cm2 ? ?Social History  ? ?Tobacco Use  ?Smoking Status Never  ?Smokeless Tobacco Never  ? ?BP Readings from Last 3 Encounters:  ?10/17/21 130/60  ?07/31/21 (!) 174/84  ?06/26/21 124/80  ? ?Pulse Readings from Last 3 Encounters:  ?10/17/21 72   ?07/31/21 70  ?06/26/21 67  ? ?Wt Readings from Last 3 Encounters:  ?10/17/21 143 lb (64.9 kg)  ?07/29/21 162 lb 0.6 oz (73.5 kg)  ?06/26/21 151 lb 6.4 oz (68.7 kg)  ? ? ?Assessment: Review of patient past medical history, allergies, medications, health status, including review of consultants reports, laboratory and other test data, was performed as part of comprehensive evaluation and provision of chronic care management services.  ? ?SDOH:  (Social Determinants of Health) assessments and interventions performed:  ? ? ? ?CCM Care Plan ? ?Allergies  ?Allergen Reactions  ?  Penicillins Other (See Comments)  ?  Unknown reaction ?Did it involve swelling of the face/tongue/throat, SOB, or low BP? Unknown ?Did it involve sudden or severe rash/hives, skin peeling, or any reaction on the inside of your mouth or nose? Unknown ?Did you need to seek medical attention at a hospital or doctor's office? Unknown ?When did it last happen?    1940   ?If all above answers are ?NO?, may proceed with cephalosporin use.  ? ? ?Medications Reviewed Today   ? ? Reviewed by Evan Wright, Wright (Pharmacist) on 10/24/21 at [phone]  Med List Status: <None>  ? ?Medication Order Taking? Sig Documenting Provider Last Dose Status Informant  ?acetaminophen (TYLENOL) 500 MG tablet [id number] Yes Take 1,000 mg by mouth every 6 (six) hours as needed for mild pain. Provider, Historical, Wright Taking Active Child  ?aspirin EC 81 MG tablet [id number] Yes Take 1 tablet (81 mg total) by mouth daily at 12 noon.  ?Patient taking differently: Take 81 mg by mouth every morning.  ? Modena Jansky, Wright Taking Active Child  ?atorvastatin (LIPITOR) 80 MG tablet [id number] Yes TAKE ONE TABLET (80MG TOTAL) BY MOUTH DAILY [place], Wright Taking Active Child  ?Calcium Carb-Cholecalciferol (CALCIUM+D3 PO) [id number] Yes Take 500 mg by mouth every evening. Also contains magnesium Provider, Historical, Wright Taking Active Child  ?carboxymethylcellulose (REFRESH PLUS)  0.5 % SOLN [id number] Yes 1 drop daily as needed (dry eyes). Provider, Historical, Wright Taking Active Child  ?carvedilol (COREG) 3.125 MG tablet [id number] Yes TAKE ONE TABLET (3.125MG TOTAL) BY MOUTHTWICE DAILY WITH

## 2021-10-26 DIAGNOSIS — E1140 Type 2 diabetes mellitus with diabetic neuropathy, unspecified: Secondary | ICD-10-CM | POA: Diagnosis not present

## 2021-10-26 DIAGNOSIS — D631 Anemia in chronic kidney disease: Secondary | ICD-10-CM | POA: Diagnosis not present

## 2021-10-26 DIAGNOSIS — I252 Old myocardial infarction: Secondary | ICD-10-CM | POA: Diagnosis not present

## 2021-10-26 DIAGNOSIS — I714 Abdominal aortic aneurysm, without rupture, unspecified: Secondary | ICD-10-CM | POA: Diagnosis not present

## 2021-10-26 DIAGNOSIS — N189 Chronic kidney disease, unspecified: Secondary | ICD-10-CM | POA: Diagnosis not present

## 2021-10-26 DIAGNOSIS — R339 Retention of urine, unspecified: Secondary | ICD-10-CM | POA: Diagnosis not present

## 2021-10-26 DIAGNOSIS — E1122 Type 2 diabetes mellitus with diabetic chronic kidney disease: Secondary | ICD-10-CM | POA: Diagnosis not present

## 2021-10-26 DIAGNOSIS — I129 Hypertensive chronic kidney disease with stage 1 through stage 4 chronic kidney disease, or unspecified chronic kidney disease: Secondary | ICD-10-CM | POA: Diagnosis not present

## 2021-10-26 DIAGNOSIS — E039 Hypothyroidism, unspecified: Secondary | ICD-10-CM | POA: Diagnosis not present

## 2021-10-26 DIAGNOSIS — M542 Cervicalgia: Secondary | ICD-10-CM | POA: Diagnosis not present

## 2021-10-26 DIAGNOSIS — K5900 Constipation, unspecified: Secondary | ICD-10-CM | POA: Diagnosis not present

## 2021-10-26 DIAGNOSIS — M1990 Unspecified osteoarthritis, unspecified site: Secondary | ICD-10-CM | POA: Diagnosis not present

## 2021-10-26 DIAGNOSIS — I25119 Atherosclerotic heart disease of native coronary artery with unspecified angina pectoris: Secondary | ICD-10-CM | POA: Diagnosis not present

## 2021-10-26 DIAGNOSIS — M810 Age-related osteoporosis without current pathological fracture: Secondary | ICD-10-CM | POA: Diagnosis not present

## 2021-10-26 DIAGNOSIS — I255 Ischemic cardiomyopathy: Secondary | ICD-10-CM | POA: Diagnosis not present

## 2021-10-26 DIAGNOSIS — I351 Nonrheumatic aortic (valve) insufficiency: Secondary | ICD-10-CM | POA: Diagnosis not present

## 2021-10-31 NOTE — Patient Instructions (Signed)
Evan Wright ?It was a pleasure speaking with you  ?Below is a summary of your health goals and care plan ? ?Patient Goals/Self-Care Activities ?take medications as prescribed,  ?check glucose 1 to 2 times per week, document, and provide at future appointments,  ?collaborate with provider on medication access solutions ?Continue to work with home health with occupation and physical therapy ?Follow up with nephrologist / [name] as planned ?Separate levothyroxine (take each morning) and calcium supplement (take in evening) ? ? ?If you have any questions or concerns, please feel free to contact me either at the phone number below or with a MyChart message.  ? ?Keep up the good work! ? ?Evan Wright, PharmD ?Clinical Pharmacist ?[facility] ?[place] ?[phone] (direct line)  ?[phone] (main office number) ? ? ?Pharmacy Care Plan for Chronic Care Management ?Current Barriers:  ?Unable to independently monitor therapeutic efficacy ?Unable to self administer medications as prescribed ?Recent changes in renal function - need to review medication list for adjustments  ? ?Pharmacist Clinical Goal(s):  ?Over the next 60 days, patient will achieve adherence to monitoring guidelines and medication adherence to achieve therapeutic efficacy ?maintain control of type 2 diabetes as evidenced by A1c < 8.0% (given patient's age and comorbid conditions)  ?Review medication list and need for medication adjustments based on renal function through collaboration with PharmD and provider.  ? ?Interventions: ?1:1 collaboration with Carollee Herter, Alferd Apa, DO regarding development and update of comprehensive plan of care as evidenced by provider attestation and co-signature ?Inter-disciplinary care team collaboration (see longitudinal plan of care) ?Comprehensive medication review performed; medication list updated in electronic medical record ? ?Diabetes: ?Controlled; Last A1c was 7.1% (given patient's  age,  would recommended A1c < 8.0%) ?current treatment: ?None currently ?Interventions:  ?Reviewed A1c goal - will see if home health can draw A1c ?Reviewed home blood glucose goals  ?Fasting blood glucose goal (before meals) = 80 to 130 ?Blood glucose goal after a meal = less than 180  ?Check blood glucose 1 or 2 times per week ?Reminded to get annual eye exam ? ?Hypertension: ?Variable blood pressure goal < 140/90 ?BP Readings from Last 3 Encounters:  ?07/31/21 (!) 174/84  ?06/26/21 124/80  ?03/13/21 (!) 182/80  ? ?Current treatment: ?Carvedilol 3.'125mg'$  twice a day ?Interventions:  ?Discussed blood pressure goal with family ?Continue current therapy for blood pressure  ?Check blood pressure at home 2 to 3 times per week and record (home health can check)  ? ?Hyperlipidemia / heart disease: ?Controlled; LDL goal <70 ?Lab Results  ?Component Value Date  ? [place] 33 06/26/2021  ? ?Current treatment: ?Atorvastatin '80mg'$  daily  ?Aspirin '81mg'$  daily  ?Ranolazine '500mg'$  once a day ?Interventions:  ?Discussed importance of atorvastatin in prevention of secondary heart disease.  ?Education provided about LDL goal ?Recommended continue current therapy. Recheck lipids in 3 months. If LDL > 70 then consider restarting ezetimibe.  ? ?Osteoporosis: ?Current treatment: ?None - holding Alendronate '70mg'$  weekly ?Interventions:  ?Recommended hold alendronate since kidney function has changed ? ? ?Patient verbalizes understanding of instructions and care plan provided today and agrees to view in [place]. Active MyChart status confirmed with patient.    ?

## 2021-11-08 ENCOUNTER — Telehealth: Payer: Self-pay | Admitting: Pharmacist

## 2021-11-08 ENCOUNTER — Other Ambulatory Visit: Payer: Self-pay | Admitting: Family Medicine

## 2021-11-08 DIAGNOSIS — E1165 Type 2 diabetes mellitus with hyperglycemia: Secondary | ICD-10-CM | POA: Diagnosis not present

## 2021-11-08 DIAGNOSIS — R338 Other retention of urine: Secondary | ICD-10-CM | POA: Diagnosis not present

## 2021-11-08 DIAGNOSIS — N319 Neuromuscular dysfunction of bladder, unspecified: Secondary | ICD-10-CM | POA: Diagnosis not present

## 2021-11-08 DIAGNOSIS — N1832 Chronic kidney disease, stage 3b: Secondary | ICD-10-CM | POA: Diagnosis not present

## 2021-11-08 NOTE — Telephone Encounter (Signed)
Patient's daughter called office to request order for A1c blood draw. They are currently at [facility] on [address] having labs checked for nephrologist.  ?Faxed order for A1c.   ? ?

## 2021-11-09 ENCOUNTER — Telehealth: Payer: Self-pay | Admitting: Pharmacist

## 2021-11-09 LAB — HGB A1C W/O EAG: Hgb A1c MFr Bld: 6.7 % — ABNORMAL HIGH (ref 4.8–5.6)

## 2021-11-09 NOTE — Telephone Encounter (Signed)
Daughter notified of A1c results. No need to restart pharmacotherapy for diabetes at this time. Recheck A1c in 3 to 6 months.  ?

## 2021-11-15 ENCOUNTER — Inpatient Hospital Stay: Payer: Medicare Other

## 2021-11-15 ENCOUNTER — Other Ambulatory Visit: Payer: Self-pay

## 2021-11-15 ENCOUNTER — Inpatient Hospital Stay: Payer: Medicare Other | Attending: Hematology | Admitting: Hematology

## 2021-11-15 VITALS — BP 132/69 | HR 66 | Temp 97.6°F | Resp 18

## 2021-11-15 DIAGNOSIS — I255 Ischemic cardiomyopathy: Secondary | ICD-10-CM | POA: Insufficient documentation

## 2021-11-15 DIAGNOSIS — D472 Monoclonal gammopathy: Secondary | ICD-10-CM | POA: Diagnosis not present

## 2021-11-15 DIAGNOSIS — R011 Cardiac murmur, unspecified: Secondary | ICD-10-CM | POA: Insufficient documentation

## 2021-11-15 DIAGNOSIS — I209 Angina pectoris, unspecified: Secondary | ICD-10-CM | POA: Insufficient documentation

## 2021-11-15 DIAGNOSIS — Z8546 Personal history of malignant neoplasm of prostate: Secondary | ICD-10-CM | POA: Diagnosis not present

## 2021-11-15 DIAGNOSIS — I7121 Aneurysm of the ascending aorta, without rupture: Secondary | ICD-10-CM | POA: Insufficient documentation

## 2021-11-15 DIAGNOSIS — I2510 Atherosclerotic heart disease of native coronary artery without angina pectoris: Secondary | ICD-10-CM | POA: Diagnosis not present

## 2021-11-15 DIAGNOSIS — I739 Peripheral vascular disease, unspecified: Secondary | ICD-10-CM | POA: Insufficient documentation

## 2021-11-15 DIAGNOSIS — N189 Chronic kidney disease, unspecified: Secondary | ICD-10-CM | POA: Insufficient documentation

## 2021-11-15 DIAGNOSIS — E1122 Type 2 diabetes mellitus with diabetic chronic kidney disease: Secondary | ICD-10-CM | POA: Insufficient documentation

## 2021-11-15 DIAGNOSIS — K219 Gastro-esophageal reflux disease without esophagitis: Secondary | ICD-10-CM | POA: Insufficient documentation

## 2021-11-15 DIAGNOSIS — I129 Hypertensive chronic kidney disease with stage 1 through stage 4 chronic kidney disease, or unspecified chronic kidney disease: Secondary | ICD-10-CM | POA: Insufficient documentation

## 2021-11-15 DIAGNOSIS — Z79899 Other long term (current) drug therapy: Secondary | ICD-10-CM | POA: Diagnosis not present

## 2021-11-15 LAB — CBC WITH DIFFERENTIAL/PLATELET
Abs Immature Granulocytes: 0.03 10*3/uL (ref 0.00–0.07)
Basophils Absolute: 0 10*3/uL (ref 0.0–0.1)
Basophils Relative: 0 %
Eosinophils Absolute: 0.1 10*3/uL (ref 0.0–0.5)
Eosinophils Relative: 1 %
HCT: 37.6 % — ABNORMAL LOW (ref 39.0–52.0)
Hemoglobin: 12.5 g/dL — ABNORMAL LOW (ref 13.0–17.0)
Immature Granulocytes: 0 %
Lymphocytes Relative: 9 %
Lymphs Abs: 0.7 10*3/uL (ref 0.7–4.0)
MCH: 30.6 pg (ref 26.0–34.0)
MCHC: 33.2 g/dL (ref 30.0–36.0)
MCV: 91.9 fL (ref 80.0–100.0)
Monocytes Absolute: 0.5 10*3/uL (ref 0.1–1.0)
Monocytes Relative: 6 %
Neutro Abs: 6.3 10*3/uL (ref 1.7–7.7)
Neutrophils Relative %: 84 %
Platelets: 228 10*3/uL (ref 150–400)
RBC: 4.09 MIL/uL — ABNORMAL LOW (ref 4.22–5.81)
RDW: 13.2 % (ref 11.5–15.5)
WBC: 7.6 10*3/uL (ref 4.0–10.5)
nRBC: 0 % (ref 0.0–0.2)

## 2021-11-15 LAB — CMP (CANCER CENTER ONLY)
ALT: 12 U/L (ref 0–44)
AST: 13 U/L — ABNORMAL LOW (ref 15–41)
Albumin: 4.2 g/dL (ref 3.5–5.0)
Alkaline Phosphatase: 81 U/L (ref 38–126)
Anion gap: 8 (ref 5–15)
BUN: 44 mg/dL — ABNORMAL HIGH (ref 8–23)
CO2: 21 mmol/L — ABNORMAL LOW (ref 22–32)
Calcium: 10 mg/dL (ref 8.9–10.3)
Chloride: 110 mmol/L (ref 98–111)
Creatinine: 2.21 mg/dL — ABNORMAL HIGH (ref 0.61–1.24)
GFR, Estimated: 27 mL/min — ABNORMAL LOW (ref >60)
Glucose, Bld: 173 mg/dL — ABNORMAL HIGH (ref 70–99)
Potassium: 4.1 mmol/L (ref 3.5–5.1)
Sodium: 139 mmol/L (ref 135–145)
Total Bilirubin: 0.6 mg/dL (ref 0.3–1.2)
Total Protein: 7.6 g/dL (ref 6.5–8.1)

## 2021-11-15 LAB — SEDIMENTATION RATE: Sed Rate: 29 mm/hr — ABNORMAL HIGH (ref 0–16)

## 2021-11-15 LAB — LACTATE DEHYDROGENASE: LDH: 106 U/L (ref 98–192)

## 2021-11-15 NOTE — Progress Notes (Signed)
? ? ?HEMATOLOGY/ONCOLOGY CONSULTATION NOTE ? ?Date of Service: 11/15/2021 ? ?Patient Care Team: ?Carollee Herter, Alferd Apa, DO as PCP - General ?Calvert Cantor, MD as Consulting Physician (Ophthalmology) ?Carolan Clines, MD (Inactive) as Consulting Physician (Urology) ?Cameron Sprang, MD as Consulting Physician (Neurology) ?[name], RPH-CPP (Pharmacist) ? ?CHIEF COMPLAINTS/PURPOSE OF CONSULTATION:  ?Evaluation and consultation of abnormal SPEP. ? ?HISTORY OF PRESENTING ILLNESS:  ? ?Evan Wright is a wonderful 88 y.o. male who has been referred to [place] by Dr Carollee Herter, Alferd Apa, DO for evaluation and consultation of abnormal SPEP. He presents today accompanied by his daughter. He reports He is doing well. ? ?His daughter notes that his bladder is not emptying properly. We discussed that this could just be age-related urinary incontinence. ? ?His daughter notes that his appetite has decreased roughly 20 lbs since Fall 2022. and he has been losing weight. ? ?He reports that he has had 4 UTIs since placement of catheter. ? ?He notes difficulty with bowel movements such as diarrhea. We discussed limiting medications that can cause diarrhea. We further discussed potential sacral, peripheral and central nerve-related issues and potential treatments based on evaluative decisions based on how far pt would like to pursue this at this time. We discussed potentially starting Calcium Polycarbophil or another kind of stool hardener. We discussed stopping stool softener, magnesium oxide, and metformin was previously stopped by PCP. ? ?We discussed getting labs today which he was agreeable to. ? ?He reports some issues with his back. He notes he uses a cane. He reports that when walking with cane one day his knee gave out. We discussed that he may have age-related muscular atrophy.  ? ?We discussed likelihood that this represents MGUS  ?He reports no back injuries or major falls. ? ?We discussed, based on his chart and  available labs, that he may elect to take a conservative approach which he was agreeable to. ? ?No new focal bone pains ?No other new or acute focal symptoms. ? ?We discussed labs done 09/12/2021. ?CBC stable. ?CMP stable with creatinine of 1.78 consistent with chronic kidney disease. ? ?MEDICAL HISTORY:  ?Past Medical History:  ?Diagnosis Date  ? Abnormal EKG 04/02/2017  ? Acute kidney injury superimposed on CKD ([place]) 07/26/2021  ? Adenomatous colon polyp   ? Angina pectoris ([place]) 04/2017  ? Ascending aortic aneurysm 03/13/2021  ? CAD (coronary artery disease)   ? a. Cath 04/29/17-> high grade pLAD with R to L collaterals, high grade 1st dig  2. Cath 05/01/17 s/p ostial to mLAD with DES x 2.   ? CAD (coronary artery disease), native coronary artery 04/29/2017  ? Cardiac murmur   ? Coronary artery disease   ? De Quervain's tenosynovitis 05/18/2012  ? Diarrhea 06/26/2021  ? Diverticulosis   ? DIZZINESS 10/15/2007  ? Qualifier: Diagnosis of  By: [name]    ? Essential hypertension 09/25/2017  ? Family history of malignant neoplasm of gastrointestinal tract 05/14/2011  ? Brother with colon cancer   ? Fecal impaction ([place]) 07/26/2021  ? Frequent falls 07/26/2021  ? GASTROENTERITIS 01/20/2008  ? Qualifier: Diagnosis of  By: [name], [name]    ? GERD (gastroesophageal reflux disease)   ? HIATAL HERNIA WITH REFLUX 02/11/2008  ? Qualifier: Diagnosis of  By: [name]    ? History of hiatal hernia   ? Hyperglycemia 09/25/2017  ? Hyperlipidemia associated with type 2 diabetes mellitus ([place])   ? Hypertension associated with diabetes ([place]) 09/25/2017  ?  Hypokalemia 07/26/2021  ? Hypothyroidism 09/25/2017  ? Idiopathic peripheral neuropathy 10/11/2016  ? Intermittent claudication ([place]) 06/02/2020  ? Ischemic cardiomyopathy 04/10/2017  ? Loss of height 06/11/2016  ? Mixed dyslipidemia 04/10/2017  ? Moderate aortic regurgitation 03/13/2021  ? NEVI, MULTIPLE 07/12/2010  ? Qualifier: Diagnosis of  By: [name]    ?  Nonsustained ventricular tachycardia 04/24/2017  ? Osteoarthritis   ? OSTEOARTHRITIS 07/12/2010  ? Qualifier: Diagnosis of  By: [name]    ? Osteopenia 09/25/2017  ? Pedal edema 03/13/2021  ? Personal history of colonic polyps 11/17/2007  ? Qualifier: Diagnosis of  By: [name], [name]   ? PNA (pneumonia) 07/27/2021  ? Preventative health care 09/23/2018  ? Prostate cancer ([place])   ? PROSTATE CANCER, HX OF 10/15/2007  ? Qualifier: Diagnosis of  By: [name]    ? Proximal leg weakness 10/11/2016  ? Right arm weakness 09/28/2018  ? Right lower lobe pneumonia 07/26/2021  ? SDH (subdural hematoma) 09/28/2018  ? Seizure-like activity ([place]) 09/28/2018  ? Slow transit constipation 08/10/2019  ? Traumatic subdural hemorrhage with loss of consciousness of unspecified duration, initial encounter ([place]) 06/26/2021  ? Type 2 diabetes mellitus ([place]) 09/23/2018  ? Uncontrolled diabetes mellitus with hyperglycemia ([place]) 09/23/2018  ? Unspecified vitamin D deficiency 02/11/2008  ? Qualifier: Diagnosis of  By: [name]    ? Urinary retention 07/26/2021  ? Weakness of both lower extremities 06/11/2016  ? ? ?SURGICAL HISTORY: ?Past Surgical History:  ?Procedure Laterality Date  ? APPENDECTOMY  1948  ? COLONOSCOPY    ? CORONARY STENT INTERVENTION N/A 05/01/2017  ? Procedure: CORONARY STENT INTERVENTION;  Surgeon: [name], [name], MD;  Location: [facility];  Service: Cardiovascular;  Laterality: N/A;  ? EYE SURGERY  2012  ? march and April  --Dr [name]  ? LEFT HEART CATH AND CORONARY ANGIOGRAPHY N/A 04/29/2017  ? Procedure: LEFT HEART CATH AND CORONARY ANGIOGRAPHY;  Surgeon: [name], MD;  Location: [facility];  Service: Cardiovascular;  Laterality: N/A;  ? PROSTATECTOMY    ? TONSILLECTOMY    ? ? ?SOCIAL HISTORY: ?Social History  ? ?Socioeconomic History  ? Marital status: Married  ?  Spouse name: Not on file  ? Number of children: 2  ? Years of education: Not on file  ? Highest education level: Not  on file  ?Occupational History  ? Occupation: retired  ?  Employer: RETIRED  ?Tobacco Use  ? Smoking status: Never  ? Smokeless tobacco: Never  ?Vaping Use  ? Vaping Use: Never used  ?Substance and Sexual Activity  ? Alcohol use: Never  ? Drug use: No  ? Sexual activity: Not on file  ?Other Topics Concern  ? Not on file  ?Social History Narrative  ? Right handed  ? Lives with family  ? ?Social Determinants of Health  ? ?Financial Resource Strain: Low Risk   ? Difficulty of Paying Living Expenses: Not hard at all  ?Food Insecurity: Not on file  ?Transportation Needs: No Transportation Needs  ? Lack of Transportation (Medical): No  ? Lack of Transportation (Non-Medical): No  ?Physical Activity: Not on file  ?Stress: Not on file  ?Social Connections: Not on file  ?Intimate Partner Violence: Not on file  ? ? ?FAMILY HISTORY: ?Family History  ?Problem Relation Age of Onset  ? Colon cancer Brother   ? Lung cancer Father   ? Prostate cancer Father   ? Colitis Mother   ? ? ?  ALLERGIES:  is allergic to penicillins. ? ?MEDICATIONS:  ?Current Outpatient Medications  ?Medication Sig Dispense Refill  ? acetaminophen (TYLENOL) 500 MG tablet Take 1,000 mg by mouth every 6 (six) hours as needed for mild pain.    ? aspirin EC 81 MG tablet Take 1 tablet (81 mg total) by mouth daily at 12 noon. (Patient taking differently: Take 81 mg by mouth every morning.)    ? atorvastatin (LIPITOR) 80 MG tablet TAKE ONE TABLET ('80MG'$  TOTAL) BY MOUTH DAILY 90 tablet 2  ? Calcium Carb-Cholecalciferol (CALCIUM+D3 PO) Take 500 mg by mouth every evening. Also contains magnesium    ? carboxymethylcellulose (REFRESH PLUS) 0.5 % SOLN 1 drop daily as needed (dry eyes).    ? carvedilol (COREG) 3.125 MG tablet TAKE ONE TABLET (3.'125MG'$  TOTAL) BY MOUTHTWICE DAILY WITH A MEAL 180 tablet 1  ? lansoprazole (PREVACID) 30 MG capsule TAKE ONE CAPSULE ('30MG'$  TOTAL) BY MOUTH TWICE DAILY BEFORE A MEAL 90 capsule 3  ? levothyroxine (SYNTHROID) 100 MCG tablet Take 1 tablet  (100 mcg total) by mouth daily before breakfast. 90 tablet 0  ? magnesium oxide (MAG-OX) 400 MG tablet Take 400 mg by mouth daily. (Patient not taking: Reported on 10/24/2021)    ? Multiple Vitamins-Minerals (PR

## 2021-11-16 LAB — KAPPA/LAMBDA LIGHT CHAINS
Kappa free light chain: 195.3 mg/L — ABNORMAL HIGH (ref 3.3–19.4)
Kappa, lambda light chain ratio: 7.66 — ABNORMAL HIGH (ref 0.26–1.65)
Lambda free light chains: 25.5 mg/L (ref 5.7–26.3)

## 2021-11-16 LAB — BETA 2 MICROGLOBULIN, SERUM: Beta-2 Microglobulin: 4.8 mg/L — ABNORMAL HIGH (ref 0.6–2.4)

## 2021-11-18 DIAGNOSIS — E782 Mixed hyperlipidemia: Secondary | ICD-10-CM | POA: Diagnosis not present

## 2021-11-18 DIAGNOSIS — E1165 Type 2 diabetes mellitus with hyperglycemia: Secondary | ICD-10-CM

## 2021-11-18 DIAGNOSIS — I25118 Atherosclerotic heart disease of native coronary artery with other forms of angina pectoris: Secondary | ICD-10-CM

## 2021-11-18 DIAGNOSIS — I10 Essential (primary) hypertension: Secondary | ICD-10-CM | POA: Diagnosis not present

## 2021-11-19 LAB — MULTIPLE MYELOMA PANEL, SERUM
Albumin SerPl Elph-Mcnc: 3.5 g/dL (ref 2.9–4.4)
Albumin/Glob SerPl: 1.2 (ref 0.7–1.7)
Alpha 1: 0.3 g/dL (ref 0.0–0.4)
Alpha2 Glob SerPl Elph-Mcnc: 0.8 g/dL (ref 0.4–1.0)
B-Globulin SerPl Elph-Mcnc: 0.7 g/dL (ref 0.7–1.3)
Gamma Glob SerPl Elph-Mcnc: 1.2 g/dL (ref 0.4–1.8)
Globulin, Total: 3 g/dL (ref 2.2–3.9)
IgA: 188 mg/dL (ref 61–437)
IgG (Immunoglobin G), Serum: 1195 mg/dL (ref 603–1613)
IgM (Immunoglobulin M), Srm: 76 mg/dL (ref 15–143)
M Protein SerPl Elph-Mcnc: 0.6 g/dL — ABNORMAL HIGH
Total Protein ELP: 6.5 g/dL (ref 6.0–8.5)

## 2021-11-20 ENCOUNTER — Telehealth: Payer: Self-pay | Admitting: Family Medicine

## 2021-11-20 NOTE — Telephone Encounter (Signed)
Patients daughter would like to know if [facility] can provide [place] orders for PT  ? ?Patient is needing HH for strength and balance  ? ?Santiago Glad- pt's daughter [phone] please contact to discuss options  ?

## 2021-11-21 ENCOUNTER — Other Ambulatory Visit: Payer: Self-pay | Admitting: Family Medicine

## 2021-11-21 NOTE — Telephone Encounter (Signed)
Called Pt LVM to call our office back to discuss / make appointment  ?

## 2021-11-21 NOTE — Telephone Encounter (Signed)
Appointment was made

## 2021-11-25 DIAGNOSIS — I351 Nonrheumatic aortic (valve) insufficiency: Secondary | ICD-10-CM | POA: Diagnosis not present

## 2021-11-25 DIAGNOSIS — E039 Hypothyroidism, unspecified: Secondary | ICD-10-CM | POA: Diagnosis not present

## 2021-11-25 DIAGNOSIS — I255 Ischemic cardiomyopathy: Secondary | ICD-10-CM | POA: Diagnosis not present

## 2021-11-25 DIAGNOSIS — R339 Retention of urine, unspecified: Secondary | ICD-10-CM | POA: Diagnosis not present

## 2021-11-25 DIAGNOSIS — I25119 Atherosclerotic heart disease of native coronary artery with unspecified angina pectoris: Secondary | ICD-10-CM | POA: Diagnosis not present

## 2021-11-25 DIAGNOSIS — M542 Cervicalgia: Secondary | ICD-10-CM | POA: Diagnosis not present

## 2021-11-25 DIAGNOSIS — N189 Chronic kidney disease, unspecified: Secondary | ICD-10-CM | POA: Diagnosis not present

## 2021-11-25 DIAGNOSIS — E1122 Type 2 diabetes mellitus with diabetic chronic kidney disease: Secondary | ICD-10-CM | POA: Diagnosis not present

## 2021-11-25 DIAGNOSIS — E1140 Type 2 diabetes mellitus with diabetic neuropathy, unspecified: Secondary | ICD-10-CM | POA: Diagnosis not present

## 2021-11-25 DIAGNOSIS — K5900 Constipation, unspecified: Secondary | ICD-10-CM | POA: Diagnosis not present

## 2021-11-25 DIAGNOSIS — I714 Abdominal aortic aneurysm, without rupture, unspecified: Secondary | ICD-10-CM | POA: Diagnosis not present

## 2021-11-25 DIAGNOSIS — M1990 Unspecified osteoarthritis, unspecified site: Secondary | ICD-10-CM | POA: Diagnosis not present

## 2021-11-25 DIAGNOSIS — I129 Hypertensive chronic kidney disease with stage 1 through stage 4 chronic kidney disease, or unspecified chronic kidney disease: Secondary | ICD-10-CM | POA: Diagnosis not present

## 2021-11-25 DIAGNOSIS — I252 Old myocardial infarction: Secondary | ICD-10-CM | POA: Diagnosis not present

## 2021-11-25 DIAGNOSIS — M810 Age-related osteoporosis without current pathological fracture: Secondary | ICD-10-CM | POA: Diagnosis not present

## 2021-11-25 DIAGNOSIS — D631 Anemia in chronic kidney disease: Secondary | ICD-10-CM | POA: Diagnosis not present

## 2021-11-26 DIAGNOSIS — N1832 Chronic kidney disease, stage 3b: Secondary | ICD-10-CM | POA: Diagnosis not present

## 2021-11-29 ENCOUNTER — Telehealth (INDEPENDENT_AMBULATORY_CARE_PROVIDER_SITE_OTHER): Payer: Medicare Other | Admitting: Family Medicine

## 2021-11-29 ENCOUNTER — Other Ambulatory Visit: Payer: Medicare Other

## 2021-11-29 ENCOUNTER — Ambulatory Visit (INDEPENDENT_AMBULATORY_CARE_PROVIDER_SITE_OTHER): Payer: Medicare Other | Admitting: Pharmacist

## 2021-11-29 ENCOUNTER — Encounter: Payer: Self-pay | Admitting: Family Medicine

## 2021-11-29 DIAGNOSIS — R2689 Other abnormalities of gait and mobility: Secondary | ICD-10-CM

## 2021-11-29 DIAGNOSIS — E1140 Type 2 diabetes mellitus with diabetic neuropathy, unspecified: Secondary | ICD-10-CM | POA: Diagnosis not present

## 2021-11-29 DIAGNOSIS — E1165 Type 2 diabetes mellitus with hyperglycemia: Secondary | ICD-10-CM

## 2021-11-29 DIAGNOSIS — R197 Diarrhea, unspecified: Secondary | ICD-10-CM

## 2021-11-29 DIAGNOSIS — I10 Essential (primary) hypertension: Secondary | ICD-10-CM

## 2021-11-29 DIAGNOSIS — E039 Hypothyroidism, unspecified: Secondary | ICD-10-CM

## 2021-11-29 DIAGNOSIS — E1169 Type 2 diabetes mellitus with other specified complication: Secondary | ICD-10-CM

## 2021-11-29 DIAGNOSIS — M8580 Other specified disorders of bone density and structure, unspecified site: Secondary | ICD-10-CM

## 2021-11-29 DIAGNOSIS — I25118 Atherosclerotic heart disease of native coronary artery with other forms of angina pectoris: Secondary | ICD-10-CM

## 2021-11-29 DIAGNOSIS — E785 Hyperlipidemia, unspecified: Secondary | ICD-10-CM

## 2021-11-29 DIAGNOSIS — R29898 Other symptoms and signs involving the musculoskeletal system: Secondary | ICD-10-CM

## 2021-11-29 DIAGNOSIS — E782 Mixed hyperlipidemia: Secondary | ICD-10-CM

## 2021-11-29 HISTORY — DX: Other abnormalities of gait and mobility: R26.89

## 2021-11-29 NOTE — Assessment & Plan Note (Signed)
Well controlled, no changes to meds. Encouraged heart healthy diet such as the DASH diet and exercise as tolerated.  ?HH to check bp tomorrow ?

## 2021-11-29 NOTE — Assessment & Plan Note (Signed)
Home health for pt  ?And nurse for foley changes  ?

## 2021-11-29 NOTE — Progress Notes (Signed)
? ? ?MyChart Video Visit ? ? ? ?Virtual Visit via Video Note  ? ?This visit type was conducted due to national recommendations for restrictions regarding the COVID-19 Pandemic (e.g. social distancing) in an effort to limit this patient's exposure and mitigate transmission in our community. This patient is at least at moderate risk for complications without adequate follow up. This format is felt to be most appropriate for this patient at this time. Physical exam was limited by quality of the video and audio technology used for the visit. Alinda Dooms was able to get the patient set up on a video visit. ? ?Patient location: Home Patient and provider in visit ?Provider location: Office ? ?I discussed the limitations of evaluation and management by telemedicine and the availability of in person appointments. The patient expressed understanding and agreed to proceed. ? ?Visit Date: 11/29/2021 ? ?Today's healthcare provider: [name], DO  ? ? ? ?Subjective:  ? ? Patient ID: Evan Wright, male    DOB: [birth year], 88 y.o.   MRN: [id number] ? ?Chief Complaint  ?Patient presents with  ? Home Health  ?  Pt's daughter would like to discuss home health for therapy for his leg and would like to set up his cather to be changed at home.  ? ? ?HPI ?Patient is in today for a video visit.  ? ?He continues struggling getting to the bathroom for his bowel movements. His daughter reports he is having diarrhea bowel movements at least 3 daily. His hematologist switched his medication to magnesium based medication and she reports mild improvement. He has difficulty walking and getting out of his chair. He feels like his legs will give out while walking. His daughter denies any falls. His daughter would like to have home health physical therapy to help regain his leg strength so he can walk and get out of his chair easier.  ?Her daughter is also requesting to change his catheter at home.  ? ? ?Past Medical History:   ?Diagnosis Date  ? Abnormal EKG 04/02/2017  ? Acute kidney injury superimposed on CKD ([place]) 07/26/2021  ? Adenomatous colon polyp   ? Angina pectoris ([place]) 04/2017  ? Ascending aortic aneurysm ([place]) 03/13/2021  ? CAD (coronary artery disease)   ? a. Cath 04/29/17-> high grade pLAD with R to L collaterals, high grade 1st dig  2. Cath 05/01/17 s/p ostial to mLAD with DES x 2.   ? CAD (coronary artery disease), native coronary artery 04/29/2017  ? Cardiac murmur   ? Coronary artery disease   ? De Quervain's tenosynovitis 05/18/2012  ? Diarrhea 06/26/2021  ? Diverticulosis   ? DIZZINESS 10/15/2007  ? Qualifier: Diagnosis of  By: [name]    ? Essential hypertension 09/25/2017  ? Family history of malignant neoplasm of gastrointestinal tract 05/14/2011  ? Brother with colon cancer   ? Fecal impaction ([place]) 07/26/2021  ? Frequent falls 07/26/2021  ? GASTROENTERITIS 01/20/2008  ? Qualifier: Diagnosis of  By: [name], [name]    ? GERD (gastroesophageal reflux disease)   ? HIATAL HERNIA WITH REFLUX 02/11/2008  ? Qualifier: Diagnosis of  By: [name]    ? History of hiatal hernia   ? Hyperglycemia 09/25/2017  ? Hyperlipidemia associated with type 2 diabetes mellitus ([place])   ? Hypertension associated with diabetes ([place]) 09/25/2017  ? Hypokalemia 07/26/2021  ? Hypothyroidism 09/25/2017  ? Idiopathic peripheral neuropathy 10/11/2016  ? Intermittent claudication ([place]) 06/02/2020  ? Ischemic cardiomyopathy 04/10/2017  ?  Loss of height 06/11/2016  ? Mixed dyslipidemia 04/10/2017  ? Moderate aortic regurgitation 03/13/2021  ? NEVI, MULTIPLE 07/12/2010  ? Qualifier: Diagnosis of  By: [name]    ? Nonsustained ventricular tachycardia ([place]) 04/24/2017  ? Osteoarthritis   ? OSTEOARTHRITIS 07/12/2010  ? Qualifier: Diagnosis of  By: [name]    ? Osteopenia 09/25/2017  ? Pedal edema 03/13/2021  ? Personal history of colonic polyps 11/17/2007  ? Qualifier: Diagnosis of  By: [name], [name]   ? PNA (pneumonia)  07/27/2021  ? Preventative health care 09/23/2018  ? Prostate cancer ([place])   ? PROSTATE CANCER, HX OF 10/15/2007  ? Qualifier: Diagnosis of  By: [name]    ? Proximal leg weakness 10/11/2016  ? Right arm weakness 09/28/2018  ? Right lower lobe pneumonia 07/26/2021  ? SDH (subdural hematoma) ([place]) 09/28/2018  ? Seizure-like activity ([place]) 09/28/2018  ? Slow transit constipation 08/10/2019  ? Traumatic subdural hemorrhage with loss of consciousness of unspecified duration, initial encounter ([place]) 06/26/2021  ? Type 2 diabetes mellitus ([place]) 09/23/2018  ? Uncontrolled diabetes mellitus with hyperglycemia ([place]) 09/23/2018  ? Unspecified vitamin D deficiency 02/11/2008  ? Qualifier: Diagnosis of  By: [name]    ? Urinary retention 07/26/2021  ? Weakness of both lower extremities 06/11/2016  ? ? ?Past Surgical History:  ?Procedure Laterality Date  ? APPENDECTOMY  1948  ? COLONOSCOPY    ? CORONARY STENT INTERVENTION N/A 05/01/2017  ? Procedure: CORONARY STENT INTERVENTION;  Surgeon: [name], [name], MD;  Location: [facility];  Service: Cardiovascular;  Laterality: N/A;  ? EYE SURGERY  2012  ? march and April  --Dr [name]  ? LEFT HEART CATH AND CORONARY ANGIOGRAPHY N/A 04/29/2017  ? Procedure: LEFT HEART CATH AND CORONARY ANGIOGRAPHY;  Surgeon: [name], MD;  Location: [facility];  Service: Cardiovascular;  Laterality: N/A;  ? PROSTATECTOMY    ? TONSILLECTOMY    ? ? ?Family History  ?Problem Relation Age of Onset  ? Colon cancer Brother   ? Lung cancer Father   ? Prostate cancer Father   ? Colitis Mother   ? ? ?Social History  ? ?Socioeconomic History  ? Marital status: Married  ?  Spouse name: Not on file  ? Number of children: 2  ? Years of education: Not on file  ? Highest education level: Not on file  ?Occupational History  ? Occupation: retired  ?  Employer: RETIRED  ?Tobacco Use  ? Smoking status: Never  ? Smokeless tobacco: Never  ?Vaping Use  ? Vaping Use: Never used  ?Substance and  Sexual Activity  ? Alcohol use: Never  ? Drug use: No  ? Sexual activity: Not on file  ?Other Topics Concern  ? Not on file  ?Social History Narrative  ? Right handed  ? Lives with family  ? ?Social Determinants of Health  ? ?Financial Resource Strain: Low Risk   ? Difficulty of Paying Living Expenses: Not hard at all  ?Food Insecurity: Not on file  ?Transportation Needs: No Transportation Needs  ? Lack of Transportation (Medical): No  ? Lack of Transportation (Non-Medical): No  ?Physical Activity: Not on file  ?Stress: Not on file  ?Social Connections: Not on file  ?Intimate Partner Violence: Not on file  ? ? ?Outpatient Medications Prior to Visit  ?Medication Sig Dispense Refill  ? acetaminophen (TYLENOL) 500 MG tablet Take 1,000 mg by mouth every 6 (six)  hours as needed for mild pain.    ? aspirin EC 81 MG tablet Take 1 tablet (81 mg total) by mouth daily at 12 noon. (Patient taking differently: Take 81 mg by mouth every morning.)    ? atorvastatin (LIPITOR) 80 MG tablet TAKE ONE TABLET ('80MG'$  TOTAL) BY MOUTH DAILY 90 tablet 2  ? Calcium Carb-Cholecalciferol (CALCIUM+D3 PO) Take 500 mg by mouth every evening. Also contains magnesium    ? carboxymethylcellulose (REFRESH PLUS) 0.5 % SOLN 1 drop daily as needed (dry eyes).    ? carvedilol (COREG) 3.125 MG tablet TAKE ONE TABLET (3.'125MG'$  TOTAL) BY MOUTHTWICE DAILY WITH A MEAL 180 tablet 1  ? lansoprazole (PREVACID) 30 MG capsule TAKE ONE CAPSULE ('30MG'$  TOTAL) BY MOUTH TWICE DAILY BEFORE A MEAL (Patient taking differently: Take 30 mg by mouth daily.) 90 capsule 3  ? levothyroxine (SYNTHROID) 100 MCG tablet Take 1 tablet (100 mcg total) by mouth daily before breakfast. 90 tablet 0  ? Multiple Vitamins-Minerals (PRESERVISION AREDS 2) CAPS Take 1 capsule by mouth in the morning.    ? nitroGLYCERIN (NITROSTAT) 0.4 MG SL tablet Place 0.4 mg under the tongue every 5 (five) minutes as needed for chest pain.    ? ranolazine (RANEXA) 500 MG 12 hr tablet TAKE ONE TABLET (500 MG  TOTAL) BY MOUTH ONCE DAILY 90 tablet 3  ? Vitamin D, Ergocalciferol, (DRISDOL) 1.25 MG (50000 UNIT) CAPS capsule TAKE ONE CAPSULE (50000 UNITS TOTAL) BY MOUTH EVERY 7 DAYS 12 capsule 1  ? ?No facility-administered med

## 2021-11-29 NOTE — Patient Instructions (Signed)
Mr Seliga and family,  ? ?It was a pleasure speaking with you  ?Below is a summary of your health goals and care plan ? ?Patient Goals/Self-Care Activities ?take medications as prescribed,  ?check glucose 1 to 2 times per week, document, and provide at future appointments,  ?collaborate with provider on medication access solutions ?Continue to work with home health with occupation and physical therapy ?Follow up with nephrologist / [name] as planned ?Separate levothyroxine (take each morning) and calcium supplement (take in evening) ?Discussed easier to swallow calcium. Try either chewable, Caltrate Minis or Citracal petite or generic equilivant) ? ? ?If you have any questions or concerns, please feel free to contact me either at the phone number below or with a MyChart message.  ? ?Keep up the good work! ? ?[name], PharmD ?Clinical Pharmacist ?[facility] ?[place] ?[phone] (direct line)  ?[phone] (main office number) ? ? ?Pharmacy Care Plan for Chronic Care Management ?Current Barriers:  ?Unable to independently monitor therapeutic efficacy ?Unable to self administer medications as prescribed ?Recent changes in renal function - need to review medication list for adjustments  ? ?Pharmacist Clinical Goal(s):  ?Over the next 60 days, patient will achieve adherence to monitoring guidelines and medication adherence to achieve therapeutic efficacy ?maintain control of type 2 diabetes as evidenced by A1c < 8.0% (given patient's age and comorbid conditions)  ?Review medication list and need for medication adjustments based on renal function through collaboration with PharmD and provider.  ? ?Interventions: ?1:1 collaboration with Carollee Herter, Alferd Apa, DO regarding development and update of comprehensive plan of care as evidenced by provider attestation and co-signature ?Inter-disciplinary care team collaboration (see longitudinal plan of care) ?Comprehensive medication  review performed; medication list updated in electronic medical record ? ?Diabetes: ?Controlled;  ?Lab Results  ?Component Value Date  ? HGBA1C 6.7 (H) 11/08/2021  ? ?current treatment: ?None currently ?Interventions:  ?Reviewed A1c goal - will see if home health can draw A1c ?Reviewed home blood glucose goals  ?Fasting blood glucose goal (before meals) = 80 to 130 ?Blood glucose goal after a meal = less than 180  ?Check blood glucose 1 or 2 times per week ?Reminded to get annual eye exam ? ?Hypertension: ?Variable blood pressure goal < 140/90 ?BP Readings from Last 3 Encounters:  ?11/15/21 132/69  ?10/17/21 130/60  ?07/31/21 (!) 174/84  ? ?Current treatment: ?Carvedilol 3.'125mg'$  twice a day ?Interventions:  ?Discussed blood pressure goal with family ?Continue current therapy for blood pressure  ?Check blood pressure at home 2 to 3 times per week and record (home health can check)  ? ?Hyperlipidemia / heart disease: ?Controlled; LDL goal <70 ?Lab Results  ?Component Value Date  ? [place] 33 06/26/2021  ? ?Current treatment: ?Atorvastatin '80mg'$  daily  ?Aspirin '81mg'$  daily  ?Ranolazine '500mg'$  once a day ?Interventions:  ?Discussed importance of atorvastatin in prevention of secondary heart disease.  ?Education provided about LDL goal ?Recommended continue current therapy. Recheck lipids in 3 months. If LDL > 70 then consider restarting ezetimibe.  ? ?Osteoporosis: ?Current treatment: ?Calcium '500mg'$  daily  ?Vitamin D weekly ?Interventions:  ?Continue calcium and vitamin D supplementation. ? ?Follow Up Plan: Telephone follow up appointment with care management team member scheduled for:  2 to 3 months.   ? ?Patient verbalizes understanding of instructions and care plan provided today and agrees to view in [place]. Active MyChart status confirmed with patient.    ?

## 2021-11-29 NOTE — Assessment & Plan Note (Signed)
Check labs con't synthroid 

## 2021-11-29 NOTE — Assessment & Plan Note (Signed)
Check labs  hgba1c to be checked, minimize simple carbs. Increase exercise as tolerated. Continue current meds  

## 2021-11-29 NOTE — Assessment & Plan Note (Signed)
Encourage heart healthy diet such as MIND or DASH diet, increase exercise, avoid trans fats, simple carbohydrates and processed foods, consider a krill or fish or flaxseed oil cap daily.  °

## 2021-11-29 NOTE — Chronic Care Management (AMB) (Signed)
? ? ?Chronic Care Management ?Pharmacy Note ? ?11/29/2021 ?Name:  Evan Wright MRN:  [id number] DOB:  [birth year] ? ?Summary: ?Patient has seen nephrology and urology regarding hematuria and changes in serum creatinine. Nephrology is monitoring Scr and running more tests.  ?Patient has see hematology for consult regarding possible MGUS.Dr Sonny Dandy reocmmended d/c magnesium and stool softener to see if diarrhea improved. Today patient's daughter report diarrhea has improved.  ?She also asks about an easier to swallow calcium supplement. Recommended try either chewable calcium, Caltrate minis or Citracal petites.  ?A1c was checked 04/20/203 and was 6.7%. Still some elevated blood glucose reading at home but patient is doing well given again and concurrent medical conditions since stopping metformin. Recommend continue to check blood glucose 2 to 3 times per week. Contact office if > 200. ?Patient's daughters help with medication administration.  ? ? ?Subjective: ?Evan Wright is an 88 y.o. year old male who is a primary patient of [name], DO.  The CCM team was consulted for assistance with disease management and care coordination needs.   ? ?Engaged with patient's daughter, Malachy Mood by telephone for follow up visit in response to provider referral for pharmacy case management and/or care coordination services.  ? ?Consent to Services:  ?The patient was given information about Chronic Care Management services, agreed to services, and gave verbal consent prior to initiation of services.  Please see initial visit note for detailed documentation.  ? ?Patient Care Team: ?Carollee Herter, Alferd Apa, DO as PCP - General ?Calvert Cantor, MD as Consulting Physician (Ophthalmology) ?Carolan Clines, MD (Inactive) as Consulting Physician (Urology) ?Cameron Sprang, MD as Consulting Physician (Neurology) ?[name], RPH-CPP (Pharmacist) ? ?Recent office visits: ?09/07/2021 - [facility] - Phone Call. Started cephalexin 564m  bid for 7 days for UTI.  ?06/26/21-Yvonne R. CCarollee Herter DO (PCP) Seen for annual exam visit. Labs ordered. Stop Metformin due to diarrhea. Follow up in 6 months. ?  ?Recent consult visits:  ?11/15/2021 - Oncology (Dr KIrene Limbo F/U abnormal SPEP. Discussed stool softener and magnesium due to c/o diarrhea. Consider stool hardener like calcium polycarbophil. Suspects MGUS. Labs checked. F/U 4 months.  ?10/22/2021 - Nephrology (Dr PZenda Alpers Initial work up of elevated serum creatinine. Checked labs - Scr was increased more. Repeat CMP in 2 weeks. Also noted small M Spike, considering referral to hematology for evaluation. ?10/17/2021 - Cardio (Dr RGeraldo Pitter F/U aneurysm of ascending aorta and CAD. Disucssed secondary prevention. Aneurysm stable. No medication changes. F/U 9 months.  ?10/15/2021 - Urology. Seen for gross hematuria. . Thought to be related to indwelling catheter. Checked urine culture and sensitivity. F/U 1 month ?10/08/2021 - Urology. Seen for f/u urinary retention. Foley catherter placed during hospitaliztion to relieve urinary retention.  ?09/24/2021 - Podiatry (Dr GElisha Ponder Seen for routine foot care.  ?07/02/21-(Podiatry) JMarzetta Board DPM. Seen for diabetic foot exam. Follow up in 3 months. ? ?  ?Hospital visits:  ?07/31/2021 thru [facility] (Exetimibe no longer on medication list after discharge from SNF. ?07/25/2021 to 07/31/2021 [facility]. Discharged with Foley cath. ?New medications started: Levaquin 5040mevery OTHER day thru 08/01/2021; mention of Miralax every other day to prevent constipation.  ?  ? ?Objective: ? ?Lab Results  ?Component Value Date  ? CREATININE 2.21 (H) 11/15/2021  ? CREATININE 1.8 (A) 09/12/2021  ? CREATININE 1.65 (H) 07/31/2021  ? ? ?Lab  Results  ?Component Value Date  ? HGBA1C 6.7 (H) 11/08/2021  ? ?Last diabetic Eye  exam: No results found for: HMDIABEYEEXA  ?Last diabetic Foot exam: No results found for: HMDIABFOOTEX  ? ?   ?Component Value Date/Time  ? CHOL 101 06/26/2021 1145  ? CHOL 122 04/26/2021 1048  ? TRIG 130.0 06/26/2021 1145  ? HDL 42.20 06/26/2021 1145  ? HDL 45 04/26/2021 1048  ? CHOLHDL 2 06/26/2021 1145  ? VLDL 26.0 06/26/2021 1145  ? [place] 33 06/26/2021 1145  ? LDLCALC 56 04/26/2021 1048  ? LDLDIRECT 165.3 10/08/2010 1008  ? ? ? ?  Latest Ref Rng & Units 11/15/2021  ? 12:43 PM 09/12/2021  ? 12:00 AM 07/30/2021  ?  2:16 PM  ?Hepatic Function  ?Total Protein 6.5 - 8.1 g/dL 7.6    5.7    ?Albumin 3.5 - 5.0 g/dL 4.2   3.4      2.5    ?AST 15 - 41 U/L 13    18    ?ALT 0 - 44 U/L 12    21    ?Alk Phosphatase 38 - 126 U/L 81    62    ?Total Bilirubin 0.3 - 1.2 mg/dL 0.6    0.6    ?  ? This result is from an external source.  ? ? ?Lab Results  ?Component Value Date/Time  ? TSH 1.95 06/26/2021 11:45 AM  ? TSH 4.310 03/13/2021 01:47 PM  ? ? ? ?  Latest Ref Rng & Units 11/15/2021  ? 12:43 PM 09/12/2021  ? 12:00 AM 07/31/2021  ?  4:49 AM  ?CBC  ?WBC 4.0 - 10.5 K/uL 7.6   5.8      9.8    ?Hemoglobin 13.0 - 17.0 g/dL 12.5   11.7      11.8    ?Hematocrit 39.0 - 52.0 % 37.6   36      37.1    ?Platelets 150 - 400 K/uL 228   189      235    ?  ? This result is from an external source.  ? ? ?Lab Results  ?Component Value Date/Time  ? VD25OH 24.39 (L) 06/02/2020 11:17 AM  ? VD25OH 35 07/12/2010 08:35 PM  ? ? ?Clinical ASCVD: Yes  ?The ASCVD Risk score (Arnett DK, et al., 2019) failed to calculate for the following reasons: ?  The 2019 ASCVD risk score is only valid for ages 70 to 57   ? ?Other:  ?DEXA ?DualFemur Total Mean 09/05/2020 94.0 N/A -1.9 0.767 g/cm2 ?DualFemur Total Mean 07/01/2018 91.8 N/A -1.7 0.792 g/cm2 ?DualFemur Total Mean 06/11/2016 89.7 N/A -1.7 0.791 g/cm2 ?  ?Left Forearm Radius 33% 09/05/2020 94.0 N/A -3.3 0.662 g/cm2 ?Left Forearm Radius 33% 07/01/2018 91.8 N/A -2.9 0.704 g/cm2 ?Left Forearm Radius 33% 06/11/2016  89.7 N/A -3.0 0.697 g/cm2 ? ?Social History  ? ?Tobacco Use  ?Smoking Status Never  ?Smokeless Tobacco Never  ? ?BP Readings from Last 3 Encounters:  ?11/15/21 132/69  ?10/17/21 130/60  ?07/31/21 (!) 174/84  ? ?Pulse Readings from Last 3 Encounters:  ?11/15/21 66  ?10/17/21 72  ?07/31/21 70  ? ?Wt Readings from Last 3 Encounters:  ?10/17/21 143 lb (64.9 kg)  ?07/29/21 162 lb 0.6 oz (73.5 kg)  ?06/26/21 151 lb 6.4 oz (68.7 kg)  ? ? ?Assessment: Review of patient past medical history, allergies, medications, health status, including review of consultants reports, laboratory and other test data, was performed as part of  comprehensive evaluation and provision of chronic care management services.  ? ?SDOH:  (Social Determinants of Health) assessments and interventions performed:  ? ? ? ?CCM Care Plan ? ?Allergies  ?Allergen Reactions  ? Penicillins Other (See Comments)  ?  Unknown reaction ?Did it involve swelling of the face/tongue/throat, SOB, or low BP? Unknown ?Did it involve sudden or severe rash/hives, skin peeling, or any reaction on the inside of your mouth or nose? Unknown ?Did you need to seek medical attention at a hospital or doctor's office? Unknown ?When did it last happen?    1940   ?If all above answers are ?NO?, may proceed with cephalosporin use.  ? ? ?Medications Reviewed Today   ? ? Reviewed by [name], RPH-CPP (Pharmacist) on 11/29/21 at 1021  Med List Status: <None>  ? ?Medication Order Taking? Sig Documenting Provider Last Dose Status Informant  ?acetaminophen (TYLENOL) 500 MG tablet [id number] Yes Take 1,000 mg by mouth every 6 (six) hours as needed for mild pain. [provider] Taking Active Child  ?aspirin EC 81 MG tablet [id number] Yes Take 1 tablet (81 mg total) by mouth daily at 12 noon.  ?Patient taking differently: Take 81 mg by mouth every morning.  ? Modena Jansky, MD Taking Active Child  ?atorvastatin (LIPITOR) 80 MG tablet [id number] Yes TAKE ONE TABLET (80MG TOTAL) BY  MOUTH DAILY Park Liter, MD Taking Active Child  ?Calcium Carb-Cholecalciferol (CALCIUM+D3 PO) [id number] Yes Take 500 mg by mouth every evening. Also contains magnesium [provider] T

## 2021-11-30 ENCOUNTER — Encounter: Payer: Self-pay | Admitting: Family Medicine

## 2021-12-03 ENCOUNTER — Telehealth: Payer: Self-pay | Admitting: Hematology

## 2021-12-03 DIAGNOSIS — E11621 Type 2 diabetes mellitus with foot ulcer: Secondary | ICD-10-CM | POA: Diagnosis not present

## 2021-12-03 DIAGNOSIS — E039 Hypothyroidism, unspecified: Secondary | ICD-10-CM | POA: Diagnosis not present

## 2021-12-03 DIAGNOSIS — Z466 Encounter for fitting and adjustment of urinary device: Secondary | ICD-10-CM | POA: Diagnosis not present

## 2021-12-03 DIAGNOSIS — I351 Nonrheumatic aortic (valve) insufficiency: Secondary | ICD-10-CM | POA: Diagnosis not present

## 2021-12-03 DIAGNOSIS — E782 Mixed hyperlipidemia: Secondary | ICD-10-CM | POA: Diagnosis not present

## 2021-12-03 DIAGNOSIS — E1142 Type 2 diabetes mellitus with diabetic polyneuropathy: Secondary | ICD-10-CM | POA: Diagnosis not present

## 2021-12-03 DIAGNOSIS — L97511 Non-pressure chronic ulcer of other part of right foot limited to breakdown of skin: Secondary | ICD-10-CM | POA: Diagnosis not present

## 2021-12-03 DIAGNOSIS — E1159 Type 2 diabetes mellitus with other circulatory complications: Secondary | ICD-10-CM | POA: Diagnosis not present

## 2021-12-03 DIAGNOSIS — E1165 Type 2 diabetes mellitus with hyperglycemia: Secondary | ICD-10-CM | POA: Diagnosis not present

## 2021-12-03 DIAGNOSIS — I2510 Atherosclerotic heart disease of native coronary artery without angina pectoris: Secondary | ICD-10-CM | POA: Diagnosis not present

## 2021-12-03 DIAGNOSIS — I472 Ventricular tachycardia, unspecified: Secondary | ICD-10-CM | POA: Diagnosis not present

## 2021-12-03 DIAGNOSIS — I10 Essential (primary) hypertension: Secondary | ICD-10-CM | POA: Diagnosis not present

## 2021-12-03 DIAGNOSIS — E1169 Type 2 diabetes mellitus with other specified complication: Secondary | ICD-10-CM | POA: Diagnosis not present

## 2021-12-03 DIAGNOSIS — I255 Ischemic cardiomyopathy: Secondary | ICD-10-CM | POA: Diagnosis not present

## 2021-12-03 DIAGNOSIS — E1122 Type 2 diabetes mellitus with diabetic chronic kidney disease: Secondary | ICD-10-CM | POA: Diagnosis not present

## 2021-12-03 DIAGNOSIS — N189 Chronic kidney disease, unspecified: Secondary | ICD-10-CM | POA: Diagnosis not present

## 2021-12-03 NOTE — Telephone Encounter (Signed)
Left message with follow-up appointments per 4/27 los. ?

## 2021-12-04 ENCOUNTER — Ambulatory Visit: Payer: Medicare Other | Admitting: Podiatry

## 2021-12-04 ENCOUNTER — Ambulatory Visit (INDEPENDENT_AMBULATORY_CARE_PROVIDER_SITE_OTHER): Payer: Medicare Other

## 2021-12-04 ENCOUNTER — Other Ambulatory Visit: Payer: Medicare Other

## 2021-12-04 ENCOUNTER — Encounter: Payer: Self-pay | Admitting: Podiatry

## 2021-12-04 DIAGNOSIS — M79675 Pain in left toe(s): Secondary | ICD-10-CM

## 2021-12-04 DIAGNOSIS — E1142 Type 2 diabetes mellitus with diabetic polyneuropathy: Secondary | ICD-10-CM

## 2021-12-04 DIAGNOSIS — E11621 Type 2 diabetes mellitus with foot ulcer: Secondary | ICD-10-CM | POA: Diagnosis not present

## 2021-12-04 DIAGNOSIS — M79674 Pain in right toe(s): Secondary | ICD-10-CM

## 2021-12-04 DIAGNOSIS — L97519 Non-pressure chronic ulcer of other part of right foot with unspecified severity: Secondary | ICD-10-CM

## 2021-12-04 DIAGNOSIS — B351 Tinea unguium: Secondary | ICD-10-CM

## 2021-12-04 NOTE — Progress Notes (Signed)
Subjective: ?Patient presents today at risk foot care with history of diabetic neuropathy and painful thick toenails that are difficult to trim. Pain interferes with ambulation. Aggravating factors include wearing enclosed shoe gear. Pain is relieved with periodic professional debridement. ? ?New concern today: Location: Patient states he has some discomfort of right great toe and right 2nd toe. Seems right great toenail is rubbing skin of right 2nd digit. He denies any redness, drainage or swelling of digits. ? ?His daughter is present during today's visit. ? ?Evan Held, DO is patient's PCP. Last visit was 11/29/2021. ? ?Current Outpatient Medications on File Prior to Visit  ?Medication Sig Dispense Refill  ? acetaminophen (TYLENOL) 500 MG tablet Take 1,000 mg by mouth every 6 (six) hours as needed for mild pain.    ? aspirin EC 81 MG tablet Take 1 tablet (81 mg total) by mouth daily at 12 noon. (Patient taking differently: Take 81 mg by mouth every morning.)    ? atorvastatin (LIPITOR) 80 MG tablet TAKE ONE TABLET ('80MG'$  TOTAL) BY MOUTH DAILY 90 tablet 2  ? Calcium Carb-Cholecalciferol (CALCIUM+D3 PO) Take 500 mg by mouth every evening. Also contains magnesium    ? carboxymethylcellulose (REFRESH PLUS) 0.5 % SOLN 1 drop daily as needed (dry eyes).    ? carvedilol (COREG) 3.125 MG tablet TAKE ONE TABLET (3.'125MG'$  TOTAL) BY MOUTHTWICE DAILY WITH A MEAL 180 tablet 1  ? lansoprazole (PREVACID) 30 MG capsule TAKE ONE CAPSULE ('30MG'$  TOTAL) BY MOUTH TWICE DAILY BEFORE A MEAL (Patient taking differently: Take 30 mg by mouth daily.) 90 capsule 3  ? levothyroxine (SYNTHROID) 100 MCG tablet Take 1 tablet (100 mcg total) by mouth daily before breakfast. 90 tablet 0  ? Multiple Vitamins-Minerals (PRESERVISION AREDS 2) CAPS Take 1 capsule by mouth in the morning.    ? nitroGLYCERIN (NITROSTAT) 0.4 MG SL tablet Place 0.4 mg under the tongue every 5 (five) minutes as needed for chest pain.    ? ranolazine (RANEXA) 500 MG  12 hr tablet TAKE ONE TABLET (500 MG TOTAL) BY MOUTH ONCE DAILY 90 tablet 3  ? Vitamin D, Ergocalciferol, (DRISDOL) 1.25 MG (50000 UNIT) CAPS capsule TAKE ONE CAPSULE (50000 UNITS TOTAL) BY MOUTH EVERY 7 DAYS 12 capsule 1  ? ?No current facility-administered medications on file prior to visit.  ?  ? ?Allergies  ?Allergen Reactions  ? Penicillins Other (See Comments)  ?  Unknown reaction ?Did it involve swelling of the face/tongue/throat, SOB, or low BP? Unknown ?Did it involve sudden or severe rash/hives, skin peeling, or any reaction on the inside of your mouth or nose? Unknown ?Did you need to seek medical attention at a hospital or doctor's office? Unknown ?When did it last happen?    1940   ?If all above answers are ?NO?, may proceed with cephalosporin use.  ?  ? ?Objective: ?There were no vitals filed for this visit. ? ?Evan Wright is a pleasant 88 y.o. male thin build in NAD.[name] AAO X 3. ? ?Vascular Examination: ?CFT <3 seconds b/l LE. Faintly palpable pedal pulses b/l. Faintly palpable PT pulse(s) b/l LE. Pedal hair absent. No pain with calf compression b/l. Lower extremity skin temperature gradient within normal limits. No edema noted b/l LE. No ischemia or gangrene noted b/l LE. No cyanosis or clubbing noted b/l LE. ? ?Dermatological Examination: ?Pedal skin thin and atrophic b/l LE. No interdigital macerations b/l lower extremities. Toenails 1-5 b/l elongated, discolored, dystrophic, thickened, crumbly with subungual debris and tenderness to  dorsal palpation. No hyperkeratotic nor porokeratotic lesions present on today's visit. Pedal skin noted to be dry b/l lower extremities. Right great toenails abutting skin of right 2nd toe. No break in skin, no erythema, no edema, no drainage, no fluctuance. Patient unaware of wounds between right 4th and 5th digits and states they are not painful confirming neuropathy diagnosis. ? ? ? ?Wound Location: R 5th toe ?There is a minimal amount of devitalized tissue present  in the wound. ?Wound Measurement:  0.5  x 0.5 x 0.2 cm ?Postdebridement Wound Measurement: N/A; wound not debrided ?Wound Base: Granular/Healthy ?Peri-wound: Normal ?Exudate: None: wound tissue dry ?Blood Loss during debridement: N/A; wound not debrided. ?Sign(s) of clinical bacterial infection: no clinical signs of infection noted on examination today.  ? ?Wound Location: R 4th toe ?There is a minimal amount of devitalized tissue present in the wound. ?Wound Measurementx : 0.3 x 0.2 x 0.1 cm ?Postdebridement Wound Measurement: N/A; wound not debrided ?Wound Base: Granular/Healthy ?Peri-wound: Normal ?Exudate: None: wound tissue dry ?Blood Loss during debridement: N/A; wound not debrided. ?Sign(s) of clinical bacterial infection: no clinical signs of infection noted on examination today.  ? ?Musculoskeletal Examination: ?Noted disuse atrophy bilaterally. HAV with bunion deformity noted b/l LE. Hammertoe deformity noted 2-5 b/l. Patient using wheelchair for mobility assistance. ? ?Neurological Examination: ?Protective sensation diminished with 10g monofilament b/l. ? ?Xray findings right lower extremity: ?No gas in tissues right foot. ?Contracted digits 1-5 right foot. ?No bone erosion noted at location of ulceration right lower extremity and right foot. ?No evidence of fracture right foot. ?No foreign body evident right foot. ? ?Assessment and Plan: ?1. Pain due to onychomycosis of toenails of both feet   ?2. Diabetic ulcer of toe of right foot associated with type 2 diabetes mellitus, unspecified ulcer stage ([place])   ?3. Diabetic peripheral neuropathy associated with type 2 diabetes mellitus ([place])   ?  ?1. Diabetic ulcer of toe of right foot associated with type 2 diabetes mellitus, unspecified ulcer stage ([place]): Medihoney dressing changes ?- DG Foot Complete Right ?-Patient was evaluated and treated and all questions answered.  ?-Patient/POA/Family member educated on diagnosis and treatment plan of routine ulcer  debridement/wound care.  ?-Xrays of right foot taken in office and results given to daughter. Negative for signs of bone infection. ?-Ulceration cleansed with wound cleanser. silvadene cream applied to base of ulceration and secured with light dressing. Dispensed toe separator for right 4th webspace to keep digits separated. ?-Wound responded well to today's debridement. ?-Patient risk factors affecting healing of ulcer: diabetes, diabetic neuropathy ?-[name] given written instructions on daily wound care for R 4th toe and R 5th toe ulceration ?-Orders for Home Health Wound care to be faxed to:  ?[facility] ?[address] ?[place], [place] ?[phone] ?Fax: [phone] ?-Radiology ordered today: X-Ray: right foot. ?-Toenails 1-5 b/l were debrided in length and girth with sterile nail nippers and dremel without iatrogenic bleeding.  ?-Patient scheduled to see Dr. Celesta Gentile in 3 weeks per family availability for follow up of diabetic ulcer right 4th and 5th digits. ?-Patient/POA to call should there be question/concern in the interim. ? ?Return in about 3 weeks (around 12/25/2021). ? ?Marzetta Board, DPM  ? ?  ?

## 2021-12-04 NOTE — Patient Instructions (Signed)
DRESSING CHANGES right foot:  ? ?PHARMACY SHOPPING LIST: ?Saline or Wound Cleanser for cleaning wound ?2 x 2 inch sterile gauze for cleaning wound ?Medihoney Gel ? ?A. IF DISPENSED, WEAR SURGICAL SHOE OR WALKING BOOT AT ALL TIMES. ? ?B. IF PRESCRIBED ORAL ANTIBIOTICS, TAKE ALL MEDICATION AS PRESCRIBED UNTIL ALL ARE GONE. ? ?C. IF DOCTOR HAS DESIGNATED NONWEIGHTBEARING STATUS, PLEASE ADHERE TO INSTRUCTIONS. ? ?1. KEEP RIGHT GOOT DRY AT ALL TIMES!!!! ? ?2. CLEANSE ULCER WITH SALINE OR WOUND CLEANSER. ? ?3. DAB DRY WITH GAUZE SPONGE. ? ?4. APPLY A LIGHT AMOUNT OF Medihoney Gel TO BASE OF ULCER. ? ?5. APPLY OUTER DRESSING. ? ?DO NOT WALK BAREFOOT!!! ? ?IF YOU EXPERIENCE ANY FEVER, CHILLS, NIGHTSWEATS, NAUSEA OR VOMITING, ELEVATED OR LOW BLOOD SUGARS, REPORT TO EMERGENCY ROOM. ? ?9. IF YOU EXPERIENCE INCREASED REDNESS, PAIN, SWELLING, DISCOLORATION, ODOR, PUS, DRAINAGE OR WARMTH OF YOUR FOOT, REPORT TO EMERGENCY ROOM.  ?

## 2021-12-05 ENCOUNTER — Telehealth: Payer: Self-pay | Admitting: *Deleted

## 2021-12-05 NOTE — Telephone Encounter (Signed)
Faxed  ?Ambulatory referral to [facility], confirmation received 12/05/21 ?

## 2021-12-10 ENCOUNTER — Telehealth: Payer: Self-pay | Admitting: Family Medicine

## 2021-12-10 NOTE — Telephone Encounter (Signed)
Caller/Agency: Mickel Ba[facility] ([facility]) Callback Number: [phone] Requesting OT/PT/Skilled Nursing/Social Work/Speech Therapy: PT Frequency: 2 w 4, 1 w 4

## 2021-12-10 NOTE — Telephone Encounter (Signed)
Verbal given 

## 2021-12-11 ENCOUNTER — Telehealth: Payer: Self-pay | Admitting: Family Medicine

## 2021-12-11 NOTE — Telephone Encounter (Signed)
Mickel Baas [facility]) called to inform the provider that the pt had a missed visit for today. Pt was not feeling well.

## 2021-12-12 ENCOUNTER — Other Ambulatory Visit (INDEPENDENT_AMBULATORY_CARE_PROVIDER_SITE_OTHER): Payer: Medicare Other

## 2021-12-12 DIAGNOSIS — E1169 Type 2 diabetes mellitus with other specified complication: Secondary | ICD-10-CM

## 2021-12-12 DIAGNOSIS — E785 Hyperlipidemia, unspecified: Secondary | ICD-10-CM

## 2021-12-12 DIAGNOSIS — E1140 Type 2 diabetes mellitus with diabetic neuropathy, unspecified: Secondary | ICD-10-CM | POA: Diagnosis not present

## 2021-12-12 DIAGNOSIS — E039 Hypothyroidism, unspecified: Secondary | ICD-10-CM

## 2021-12-12 DIAGNOSIS — I10 Essential (primary) hypertension: Secondary | ICD-10-CM

## 2021-12-12 LAB — MICROALBUMIN / CREATININE URINE RATIO
Creatinine,U: 53.4 mg/dL
Microalb Creat Ratio: 29.5 mg/g (ref 0.0–30.0)
Microalb, Ur: 15.8 mg/dL — ABNORMAL HIGH (ref 0.0–1.9)

## 2021-12-12 LAB — CBC WITH DIFFERENTIAL/PLATELET
Basophils Absolute: 0 10*3/uL (ref 0.0–0.1)
Basophils Relative: 0.3 % (ref 0.0–3.0)
Eosinophils Absolute: 0.1 10*3/uL (ref 0.0–0.7)
Eosinophils Relative: 1.5 % (ref 0.0–5.0)
HCT: 39.7 % (ref 39.0–52.0)
Hemoglobin: 12.9 g/dL — ABNORMAL LOW (ref 13.0–17.0)
Lymphocytes Relative: 11.1 % — ABNORMAL LOW (ref 12.0–46.0)
Lymphs Abs: 0.7 10*3/uL (ref 0.7–4.0)
MCHC: 32.6 g/dL (ref 30.0–36.0)
MCV: 93.6 fl (ref 78.0–100.0)
Monocytes Absolute: 0.4 10*3/uL (ref 0.1–1.0)
Monocytes Relative: 6.1 % (ref 3.0–12.0)
Neutro Abs: 5.2 10*3/uL (ref 1.4–7.7)
Neutrophils Relative %: 81 % — ABNORMAL HIGH (ref 43.0–77.0)
Platelets: 213 10*3/uL (ref 150.0–400.0)
RBC: 4.25 Mil/uL (ref 4.22–5.81)
RDW: 13.8 % (ref 11.5–15.5)
WBC: 6.5 10*3/uL (ref 4.0–10.5)

## 2021-12-12 LAB — LIPID PANEL
Cholesterol: 110 mg/dL (ref 0–200)
HDL: 41.2 mg/dL (ref >39.00)
LDL Cholesterol: 39 mg/dL (ref 0–99)
NonHDL: 68.41
Total CHOL/HDL Ratio: 3
Triglycerides: 149 mg/dL (ref 0.0–149.0)
VLDL: 29.8 mg/dL (ref 0.0–40.0)

## 2021-12-12 LAB — COMPREHENSIVE METABOLIC PANEL
ALT: 10 U/L (ref 0–53)
AST: 10 U/L (ref 0–37)
Albumin: 4 g/dL (ref 3.5–5.2)
Alkaline Phosphatase: 79 U/L (ref 39–117)
BUN: 40 mg/dL — ABNORMAL HIGH (ref 6–23)
CO2: 22 mEq/L (ref 19–32)
Calcium: 10.3 mg/dL (ref 8.4–10.5)
Chloride: 109 mEq/L (ref 96–112)
Creatinine, Ser: 2.14 mg/dL — ABNORMAL HIGH (ref 0.40–1.50)
GFR: 25.71 mL/min — ABNORMAL LOW (ref >60.00)
Glucose, Bld: 156 mg/dL — ABNORMAL HIGH (ref 70–99)
Potassium: 4.1 mEq/L (ref 3.5–5.1)
Sodium: 142 mEq/L (ref 135–145)
Total Bilirubin: 0.6 mg/dL (ref 0.2–1.2)
Total Protein: 6.9 g/dL (ref 6.0–8.3)

## 2021-12-12 LAB — HEMOGLOBIN A1C: Hgb A1c MFr Bld: 6.7 % — ABNORMAL HIGH (ref 4.6–6.5)

## 2021-12-12 LAB — TSH: TSH: 0.19 u[IU]/mL — ABNORMAL LOW (ref 0.35–5.50)

## 2021-12-12 NOTE — Telephone Encounter (Signed)
FYI

## 2021-12-14 ENCOUNTER — Telehealth: Payer: Self-pay | Admitting: Family Medicine

## 2021-12-14 NOTE — Telephone Encounter (Signed)
FYI

## 2021-12-14 NOTE — Telephone Encounter (Signed)
Evan Wright called from [facility] regarding pt's recent fall on 5/24 around 8pm. Pt reported he did not hit his head and no injury. Tele: [phone]

## 2021-12-18 ENCOUNTER — Telehealth: Payer: Self-pay | Admitting: Family Medicine

## 2021-12-18 NOTE — Telephone Encounter (Signed)
[facility]) called asking if orders could be put in for a specimen collection regarding burning and sedimentation during urination.

## 2021-12-19 DIAGNOSIS — I25118 Atherosclerotic heart disease of native coronary artery with other forms of angina pectoris: Secondary | ICD-10-CM

## 2021-12-19 DIAGNOSIS — E119 Type 2 diabetes mellitus without complications: Secondary | ICD-10-CM

## 2021-12-19 DIAGNOSIS — E782 Mixed hyperlipidemia: Secondary | ICD-10-CM

## 2021-12-19 DIAGNOSIS — I10 Essential (primary) hypertension: Secondary | ICD-10-CM

## 2021-12-19 NOTE — Telephone Encounter (Signed)
Verbal given 

## 2021-12-21 ENCOUNTER — Telehealth: Payer: Self-pay | Admitting: Podiatry

## 2021-12-21 ENCOUNTER — Other Ambulatory Visit: Payer: Self-pay | Admitting: Family Medicine

## 2021-12-21 DIAGNOSIS — E039 Hypothyroidism, unspecified: Secondary | ICD-10-CM

## 2021-12-21 NOTE — Telephone Encounter (Signed)
Spoke to pts daughter Malachy Mood, and she will have home health fax over the documentation requested to your attention. She cancelled the appt for 6/6.   Thank you

## 2021-12-21 NOTE — Telephone Encounter (Signed)
Pt's daughter called and states pt has appt with Dr Elisha Ponder on 6/6 but pts toes have really gotten better and his home health nurse dont think he need this appt. She wanted to get your recommendation as to if she should cancel his appt or not.  Please advise

## 2021-12-25 ENCOUNTER — Telehealth: Payer: Self-pay | Admitting: Pharmacist

## 2021-12-25 ENCOUNTER — Ambulatory Visit: Payer: Medicare Other | Admitting: Podiatry

## 2021-12-25 MED ORDER — LEVOTHYROXINE SODIUM 88 MCG PO TABS: 88.0000 ug | ORAL_TABLET | Freq: Every day | ORAL | 2 refills | Status: DC

## 2021-12-25 NOTE — Telephone Encounter (Signed)
I called to discussed Mrs. Petrak's last labs and patient's daughter Santiago Glad asked about her fathers levothyroxine. My Chart message states that he was to decrease to 44[place]  Looks like new dose has not been sent in yet.  Sent updated dose to pharmacy.

## 2021-12-26 ENCOUNTER — Ambulatory Visit: Payer: Medicare Other | Admitting: Podiatry

## 2021-12-26 ENCOUNTER — Telehealth: Payer: Self-pay | Admitting: Family Medicine

## 2021-12-26 NOTE — Telephone Encounter (Signed)
Megan [facility]) called asking for verbal ok to take orders from the pt's podiatrist and urologist.

## 2021-12-27 ENCOUNTER — Ambulatory Visit: Payer: Medicare Other | Admitting: Podiatry

## 2021-12-27 DIAGNOSIS — I739 Peripheral vascular disease, unspecified: Secondary | ICD-10-CM | POA: Diagnosis not present

## 2021-12-27 DIAGNOSIS — L97512 Non-pressure chronic ulcer of other part of right foot with fat layer exposed: Secondary | ICD-10-CM | POA: Diagnosis not present

## 2021-12-27 MED ORDER — DOXYCYCLINE HYCLATE 100 MG PO TABS: 100.0000 mg | ORAL_TABLET | Freq: Two times a day (BID) | ORAL | 0 refills | Status: DC

## 2021-12-27 NOTE — Telephone Encounter (Signed)
Verbal given 

## 2021-12-27 NOTE — Patient Instructions (Signed)
He can wash feet with soap and water, dry thoroughly.  Make sure you dry very well in between the toes.  Apply a small amount of Betadine to the wound on the right fourth toe, wrapped with a piece of gauze to help protect the wound.  We will start doxycycline  If you notice any increase swelling, redness or any signs of infection go immediately to the emergency room.

## 2021-12-28 ENCOUNTER — Telehealth: Payer: Self-pay | Admitting: *Deleted

## 2021-12-28 ENCOUNTER — Telehealth: Payer: Self-pay | Admitting: Family Medicine

## 2021-12-28 NOTE — Telephone Encounter (Signed)
[facility] would like to inform pcp of pt's missed appointment on 6/8

## 2021-12-28 NOTE — Telephone Encounter (Signed)
Patient's daughter is calling and would like for the physician to call her concerning the vein specialist referral. What if there is no blood flow to the feet, what does this mean for the patient at 88 years old. Please advise.

## 2021-12-30 NOTE — Progress Notes (Signed)
Subjective: 88 year old male presents the office today for follow-up evaluation of wounds on both of his feet and between his fourth and fifth toes.  He was last seen by Dr. Adah Perl for this.  They have been continue with daily dressing changes and applying Silvadene to the wound.  No drainage or pus at this time.  Denies any fevers or chills.   Objective: AAO x3, NAD DP/PT pulses decreased bilaterally Protective sensation decreased. There is no open lesions of the left foot.  There are some macerated tissue interdigitally which is able to clean but there is no open lesion noted. The right foot along the lateral fourth digit there is a fibrogranular wound measuring 0.8 x 1.5 x 0.2 cm and appears to have worsened compared to last appointment.  There is no fluctuance or crepitation.  Minimal edema to the toe without any ascending cellulitis. No pain with calf compression, swelling, warmth, erythema  Assessment: Right foot ulceration, PAD  Plan: -All treatment options discussed with the patient including all alternatives, risks, complications.  -I recommend arterial studies and ABI was ordered.  We discussed using Betadine on the wound daily.  Discussed washing feet with soap and water, drying thoroughly in between the toes.  Prescribed doxycycline.  Monitoring signs or symptoms of worsening infection or worsening of the ulcer report to the emergency room should any occur. -Discussed offloading shoe however given concern for instability with his gait and we will hold off on this. -Patient encouraged to call the office with any questions, concerns, change in symptoms.   Trula Slade DPM

## 2022-01-02 DIAGNOSIS — I2510 Atherosclerotic heart disease of native coronary artery without angina pectoris: Secondary | ICD-10-CM | POA: Diagnosis not present

## 2022-01-02 DIAGNOSIS — I472 Ventricular tachycardia, unspecified: Secondary | ICD-10-CM | POA: Diagnosis not present

## 2022-01-02 DIAGNOSIS — E1169 Type 2 diabetes mellitus with other specified complication: Secondary | ICD-10-CM | POA: Diagnosis not present

## 2022-01-02 DIAGNOSIS — E1142 Type 2 diabetes mellitus with diabetic polyneuropathy: Secondary | ICD-10-CM | POA: Diagnosis not present

## 2022-01-02 DIAGNOSIS — I10 Essential (primary) hypertension: Secondary | ICD-10-CM | POA: Diagnosis not present

## 2022-01-02 DIAGNOSIS — E1159 Type 2 diabetes mellitus with other circulatory complications: Secondary | ICD-10-CM | POA: Diagnosis not present

## 2022-01-02 DIAGNOSIS — L97511 Non-pressure chronic ulcer of other part of right foot limited to breakdown of skin: Secondary | ICD-10-CM | POA: Diagnosis not present

## 2022-01-02 DIAGNOSIS — N189 Chronic kidney disease, unspecified: Secondary | ICD-10-CM | POA: Diagnosis not present

## 2022-01-02 DIAGNOSIS — Z466 Encounter for fitting and adjustment of urinary device: Secondary | ICD-10-CM | POA: Diagnosis not present

## 2022-01-02 DIAGNOSIS — I255 Ischemic cardiomyopathy: Secondary | ICD-10-CM | POA: Diagnosis not present

## 2022-01-02 DIAGNOSIS — E11621 Type 2 diabetes mellitus with foot ulcer: Secondary | ICD-10-CM | POA: Diagnosis not present

## 2022-01-02 DIAGNOSIS — E039 Hypothyroidism, unspecified: Secondary | ICD-10-CM | POA: Diagnosis not present

## 2022-01-02 DIAGNOSIS — E1165 Type 2 diabetes mellitus with hyperglycemia: Secondary | ICD-10-CM | POA: Diagnosis not present

## 2022-01-02 DIAGNOSIS — I351 Nonrheumatic aortic (valve) insufficiency: Secondary | ICD-10-CM | POA: Diagnosis not present

## 2022-01-02 DIAGNOSIS — E1122 Type 2 diabetes mellitus with diabetic chronic kidney disease: Secondary | ICD-10-CM | POA: Diagnosis not present

## 2022-01-02 DIAGNOSIS — E782 Mixed hyperlipidemia: Secondary | ICD-10-CM | POA: Diagnosis not present

## 2022-01-04 DIAGNOSIS — E876 Hypokalemia: Secondary | ICD-10-CM

## 2022-01-04 DIAGNOSIS — E11621 Type 2 diabetes mellitus with foot ulcer: Secondary | ICD-10-CM | POA: Diagnosis not present

## 2022-01-04 DIAGNOSIS — I351 Nonrheumatic aortic (valve) insufficiency: Secondary | ICD-10-CM

## 2022-01-04 DIAGNOSIS — M654 Radial styloid tenosynovitis [de Quervain]: Secondary | ICD-10-CM

## 2022-01-04 DIAGNOSIS — K5641 Fecal impaction: Secondary | ICD-10-CM

## 2022-01-04 DIAGNOSIS — E039 Hypothyroidism, unspecified: Secondary | ICD-10-CM

## 2022-01-04 DIAGNOSIS — E1122 Type 2 diabetes mellitus with diabetic chronic kidney disease: Secondary | ICD-10-CM | POA: Diagnosis not present

## 2022-01-04 DIAGNOSIS — E1165 Type 2 diabetes mellitus with hyperglycemia: Secondary | ICD-10-CM

## 2022-01-04 DIAGNOSIS — K5790 Diverticulosis of intestine, part unspecified, without perforation or abscess without bleeding: Secondary | ICD-10-CM

## 2022-01-04 DIAGNOSIS — E1142 Type 2 diabetes mellitus with diabetic polyneuropathy: Secondary | ICD-10-CM

## 2022-01-04 DIAGNOSIS — Z466 Encounter for fitting and adjustment of urinary device: Secondary | ICD-10-CM | POA: Diagnosis not present

## 2022-01-04 DIAGNOSIS — E1169 Type 2 diabetes mellitus with other specified complication: Secondary | ICD-10-CM

## 2022-01-04 DIAGNOSIS — M1990 Unspecified osteoarthritis, unspecified site: Secondary | ICD-10-CM

## 2022-01-04 DIAGNOSIS — K5901 Slow transit constipation: Secondary | ICD-10-CM

## 2022-01-04 DIAGNOSIS — I472 Ventricular tachycardia, unspecified: Secondary | ICD-10-CM

## 2022-01-04 DIAGNOSIS — K449 Diaphragmatic hernia without obstruction or gangrene: Secondary | ICD-10-CM

## 2022-01-04 DIAGNOSIS — L97511 Non-pressure chronic ulcer of other part of right foot limited to breakdown of skin: Secondary | ICD-10-CM | POA: Diagnosis not present

## 2022-01-04 DIAGNOSIS — Z8701 Personal history of pneumonia (recurrent): Secondary | ICD-10-CM

## 2022-01-04 DIAGNOSIS — K219 Gastro-esophageal reflux disease without esophagitis: Secondary | ICD-10-CM

## 2022-01-04 DIAGNOSIS — I255 Ischemic cardiomyopathy: Secondary | ICD-10-CM

## 2022-01-04 DIAGNOSIS — E1159 Type 2 diabetes mellitus with other circulatory complications: Secondary | ICD-10-CM

## 2022-01-04 DIAGNOSIS — E782 Mixed hyperlipidemia: Secondary | ICD-10-CM

## 2022-01-04 DIAGNOSIS — I2510 Atherosclerotic heart disease of native coronary artery without angina pectoris: Secondary | ICD-10-CM

## 2022-01-04 DIAGNOSIS — I10 Essential (primary) hypertension: Secondary | ICD-10-CM

## 2022-01-04 DIAGNOSIS — N189 Chronic kidney disease, unspecified: Secondary | ICD-10-CM

## 2022-01-08 ENCOUNTER — Telehealth: Payer: Self-pay | Admitting: Family Medicine

## 2022-01-08 NOTE — Telephone Encounter (Signed)
Verbal given 

## 2022-01-08 NOTE — Telephone Encounter (Signed)
Caller/Agency: [facility] Number: [phone] Requesting OT/PT/Skilled Nursing/Social Work/Speech Therapy: extended orders for nursing Frequency: 2x for 4 weeks.

## 2022-01-09 ENCOUNTER — Telehealth: Payer: Self-pay | Admitting: *Deleted

## 2022-01-09 NOTE — Telephone Encounter (Signed)
Evan Wright w/ [facility] is calling to get verbal orders to treat a new area on the right  5th toe,they are currently treating the 4th toe with Betadine, dry dressing, changing daily. Please advise.

## 2022-01-11 ENCOUNTER — Other Ambulatory Visit: Payer: Self-pay | Admitting: Family Medicine

## 2022-01-15 ENCOUNTER — Encounter (HOSPITAL_COMMUNITY): Payer: Medicare Other

## 2022-01-15 ENCOUNTER — Ambulatory Visit (HOSPITAL_COMMUNITY)
Admission: RE | Admit: 2022-01-15 | Discharge: 2022-01-15 | Disposition: A | Discharge status: Home / Self Care | Payer: Medicare Other | Source: Ambulatory Visit | Attending: Cardiology | Admitting: Cardiology

## 2022-01-15 ENCOUNTER — Other Ambulatory Visit: Payer: Self-pay | Admitting: Podiatry

## 2022-01-15 DIAGNOSIS — L97512 Non-pressure chronic ulcer of other part of right foot with fat layer exposed: Secondary | ICD-10-CM | POA: Diagnosis not present

## 2022-01-15 DIAGNOSIS — I739 Peripheral vascular disease, unspecified: Secondary | ICD-10-CM

## 2022-01-17 ENCOUNTER — Ambulatory Visit: Payer: Medicare Other | Admitting: Podiatry

## 2022-01-17 DIAGNOSIS — L97512 Non-pressure chronic ulcer of other part of right foot with fat layer exposed: Secondary | ICD-10-CM

## 2022-01-17 DIAGNOSIS — I739 Peripheral vascular disease, unspecified: Secondary | ICD-10-CM | POA: Diagnosis not present

## 2022-01-18 DIAGNOSIS — I5042 Chronic combined systolic (congestive) and diastolic (congestive) heart failure: Secondary | ICD-10-CM | POA: Diagnosis not present

## 2022-01-18 DIAGNOSIS — N1832 Chronic kidney disease, stage 3b: Secondary | ICD-10-CM | POA: Diagnosis not present

## 2022-01-18 DIAGNOSIS — I50812 Chronic right heart failure: Secondary | ICD-10-CM | POA: Diagnosis not present

## 2022-01-18 DIAGNOSIS — I130 Hypertensive heart and chronic kidney disease with heart failure and stage 1 through stage 4 chronic kidney disease, or unspecified chronic kidney disease: Secondary | ICD-10-CM | POA: Diagnosis not present

## 2022-01-20 NOTE — Progress Notes (Signed)
Subjective: 88 year old male presents the office today for follow-up evaluation of wounds on both of his feet and between his fourth and fifth toes.  Wound of the fourth toe is getting better but lesion on the fifth toenail.  Moderate decrease on arterial studies noted and referral to vascular surgery was placed however is difficult to get him to and from appointments.  Currently denies any fevers or chills.  Objective: AAO x3, NAD DP/PT pulses decreased bilaterally Protective sensation decreased. There is no open lesions of the left foot.   Granular wound present on the lateral aspect of the fourth digit on the right foot which appears to be smaller.  Superficial skin breakdown, granular wound present on the fifth toe without any probing, undermining or tunneling.  There is no sign of erythema, drainage or pus or any fluctuation or crepitation but there is no malodor. No pain with calf compression, swelling, warmth, erythema  Assessment: Right foot ulceration, PAD  Plan: -All treatment options discussed with the patient including all alternatives, risks, complications.  -Although arterial studies are abnormal is difficult with the patient to and from appointments.  If needed will see vascular surgery however the wounds continue to heal and hold off on this. -We will use collagen silver dressing which I dispensed to them today to apply between the toes.  Keep the toes clean and dry.  Continue offloading. -Monitor for any clinical signs or symptoms of infection and directed to call the office immediately should any occur or go to the ER.  Trula Slade DPM

## 2022-01-24 ENCOUNTER — Telehealth: Payer: Self-pay | Admitting: Podiatry

## 2022-01-24 NOTE — Telephone Encounter (Signed)
I do not see anywhere in the previous notes about alginate around the toes.  Simply Betadine with light dressings.  Continue.  Alginate will not hurt however so that is fine to apply alginate around the toes with the dressing changes.  Please contact Megan  with [facility]... okay to apply the alginate. thanks, Dr. Amalia Hailey

## 2022-01-24 NOTE — Telephone Encounter (Signed)
Megan with [facility] called requesting clarification on wound care orders. She states she was told by Mr. Coate daughter that you would like them to apply alginate around the toes.   Please advise.

## 2022-01-28 ENCOUNTER — Telehealth: Payer: Self-pay | Admitting: *Deleted

## 2022-01-28 ENCOUNTER — Telehealth: Payer: Self-pay | Admitting: Family Medicine

## 2022-01-28 NOTE — Telephone Encounter (Signed)
Caller/Agency: [facility] Number: [phone] Requesting OT/PT/Skilled Nursing/Social Work/Speech Therapy: re start PT 7/17 Frequency: 2x 3w, 1x 4w

## 2022-01-28 NOTE — Telephone Encounter (Signed)
"  I had called last week and left a message.  I'm asking for clarification orders.  Patient is now using Alginate Silver along with Betadine in between toes.  I just need clarification on that so I can write the order and send it over to you guys as a verbal.  If you could, just give me a call back."

## 2022-01-28 NOTE — Telephone Encounter (Signed)
Verbal given 

## 2022-01-30 DIAGNOSIS — E1122 Type 2 diabetes mellitus with diabetic chronic kidney disease: Secondary | ICD-10-CM | POA: Diagnosis not present

## 2022-01-30 DIAGNOSIS — N1832 Chronic kidney disease, stage 3b: Secondary | ICD-10-CM | POA: Diagnosis not present

## 2022-01-30 DIAGNOSIS — I129 Hypertensive chronic kidney disease with stage 1 through stage 4 chronic kidney disease, or unspecified chronic kidney disease: Secondary | ICD-10-CM | POA: Diagnosis not present

## 2022-01-30 DIAGNOSIS — N139 Obstructive and reflux uropathy, unspecified: Secondary | ICD-10-CM | POA: Diagnosis not present

## 2022-02-01 NOTE — Telephone Encounter (Signed)
I left Evan Wright a message in regards to Evan Wright.  Dr. Jacqualyn Posey stated, "At his last appt we did do a silver collagen dressing that we had in the office. It is ok to use the alginate. We were just putting it between the toes."  I advised her to call if she has any further questions or concerns.

## 2022-02-04 ENCOUNTER — Ambulatory Visit: Payer: Medicare Other | Admitting: Podiatry

## 2022-02-04 DIAGNOSIS — I10 Essential (primary) hypertension: Secondary | ICD-10-CM | POA: Diagnosis not present

## 2022-02-04 DIAGNOSIS — E1169 Type 2 diabetes mellitus with other specified complication: Secondary | ICD-10-CM | POA: Diagnosis not present

## 2022-02-04 DIAGNOSIS — N189 Chronic kidney disease, unspecified: Secondary | ICD-10-CM | POA: Diagnosis not present

## 2022-02-04 DIAGNOSIS — E782 Mixed hyperlipidemia: Secondary | ICD-10-CM | POA: Diagnosis not present

## 2022-02-04 DIAGNOSIS — E1142 Type 2 diabetes mellitus with diabetic polyneuropathy: Secondary | ICD-10-CM | POA: Diagnosis not present

## 2022-02-04 DIAGNOSIS — I351 Nonrheumatic aortic (valve) insufficiency: Secondary | ICD-10-CM | POA: Diagnosis not present

## 2022-02-04 DIAGNOSIS — L97511 Non-pressure chronic ulcer of other part of right foot limited to breakdown of skin: Secondary | ICD-10-CM | POA: Diagnosis not present

## 2022-02-04 DIAGNOSIS — E039 Hypothyroidism, unspecified: Secondary | ICD-10-CM | POA: Diagnosis not present

## 2022-02-04 DIAGNOSIS — I255 Ischemic cardiomyopathy: Secondary | ICD-10-CM | POA: Diagnosis not present

## 2022-02-04 DIAGNOSIS — E1122 Type 2 diabetes mellitus with diabetic chronic kidney disease: Secondary | ICD-10-CM | POA: Diagnosis not present

## 2022-02-04 DIAGNOSIS — E1165 Type 2 diabetes mellitus with hyperglycemia: Secondary | ICD-10-CM | POA: Diagnosis not present

## 2022-02-04 DIAGNOSIS — Z466 Encounter for fitting and adjustment of urinary device: Secondary | ICD-10-CM | POA: Diagnosis not present

## 2022-02-04 DIAGNOSIS — E1159 Type 2 diabetes mellitus with other circulatory complications: Secondary | ICD-10-CM | POA: Diagnosis not present

## 2022-02-04 DIAGNOSIS — E11621 Type 2 diabetes mellitus with foot ulcer: Secondary | ICD-10-CM | POA: Diagnosis not present

## 2022-02-04 DIAGNOSIS — I2510 Atherosclerotic heart disease of native coronary artery without angina pectoris: Secondary | ICD-10-CM | POA: Diagnosis not present

## 2022-02-04 DIAGNOSIS — I472 Ventricular tachycardia, unspecified: Secondary | ICD-10-CM | POA: Diagnosis not present

## 2022-02-12 ENCOUNTER — Other Ambulatory Visit: Payer: Medicare Other

## 2022-02-12 ENCOUNTER — Encounter: Payer: Self-pay | Admitting: Podiatry

## 2022-02-12 ENCOUNTER — Ambulatory Visit (INDEPENDENT_AMBULATORY_CARE_PROVIDER_SITE_OTHER): Payer: Medicare Other | Admitting: Podiatry

## 2022-02-12 ENCOUNTER — Telehealth: Payer: Medicare Other

## 2022-02-12 DIAGNOSIS — M79675 Pain in left toe(s): Secondary | ICD-10-CM

## 2022-02-12 DIAGNOSIS — E1142 Type 2 diabetes mellitus with diabetic polyneuropathy: Secondary | ICD-10-CM

## 2022-02-12 DIAGNOSIS — B351 Tinea unguium: Secondary | ICD-10-CM | POA: Diagnosis not present

## 2022-02-12 DIAGNOSIS — I739 Peripheral vascular disease, unspecified: Secondary | ICD-10-CM

## 2022-02-12 DIAGNOSIS — Z872 Personal history of diseases of the skin and subcutaneous tissue: Secondary | ICD-10-CM | POA: Diagnosis not present

## 2022-02-12 DIAGNOSIS — M79674 Pain in right toe(s): Secondary | ICD-10-CM

## 2022-02-13 ENCOUNTER — Ambulatory Visit (INDEPENDENT_AMBULATORY_CARE_PROVIDER_SITE_OTHER): Payer: Medicare Other | Admitting: Pharmacist

## 2022-02-13 DIAGNOSIS — E039 Hypothyroidism, unspecified: Secondary | ICD-10-CM

## 2022-02-13 DIAGNOSIS — I10 Essential (primary) hypertension: Secondary | ICD-10-CM

## 2022-02-13 DIAGNOSIS — E1169 Type 2 diabetes mellitus with other specified complication: Secondary | ICD-10-CM

## 2022-02-13 NOTE — Chronic Care Management (AMB) (Signed)
Chronic Care Management Pharmacy Note  02/13/2022 Name:  Evan Wright MRN:  [id number] DOB:  January 01, 1925  Summary: Patient has seen nephrology and urology regarding hematuria and changes in serum creatinine. Nephrology is monitoring Scr. At last appointment nephrologist suggested family discuss medications and goals of therapy. Nephrologist felt there were some medications patient might be able to discontinue.  Reviewed medication list with patient's daughter. Her biggest concern if that patient has difficulty swallowing large tablets (especially calcium supplement - even petites were too large, atorvastatin 64m and ranolazine 5013mtablets) Recommended stopping calcium supplement.  Discussed that patient could stop ranolazine. Also could stop atorvastatin but I would rather change to smaller tablet like either atorvastatin 4061mr rosuvastatin 62m69mould need to discuss with Dr KrasDrue Duntient's daughter states she will call Dr KrasGlynda Jaegerice to discuss options.  She also asked about stopping carvedilol 6.25mg40mce daily since patient's blood pressure has been at goal - usually around 120/70. I suggested she should discuss with Dr KraswNash Mantisell as carvedilol has other benefits for patient's with previous MI and angina. Also blood pressure could likely increase with discontinuation of carvedilol but might only be a small change. Carvedilol is a small tablet and daughter reports he has no problem swallowing she was just unsure if carvedilol was necessary.  Also discussed decreasing lansoprazole but daughter report patient needs it for GERD and does not wish to stop.  Discussed benefits of optimal thyroid replacement. Daughter was unsure if patient is taking 100mcg24m88mcg 16my. She will check and if he is not on 88mcg w75mrequest from Evan Wright recheck TSH later in August.   Subjective: Evan W ALDRIC WENZLER7 y.o. 45ar old male who is a primary patient of Lowne  CAnn Heldhe CCM team was consulted for assistance with disease management and care coordination needs.    Engaged with patient's daughter, Evan Wright for follow up visit in response to provider referral for pharmacy case management and/or care coordination services.   Consent to Services:  The patient was given information about Chronic Care Management services, agreed to services, and gave verbal consent prior to initiation of services.  Please see initial visit note for detailed documentation.   Patient Care Team: Lowne ChCarollee Herter RAlferd ApaPCP - General Digby, DCalvert CantorConsulting Physician (Ophthalmology) TannenbaCarolan Clinesactive) as Consulting Physician (Urology) Aquino, Cameron SprangConsulting Physician (Neurology) Klay Sobotka, Cherre RobinsP (Pharmacist)  Recent office visits: 12/12/2021 - Fam Med (Dr Lowne-ChCarollee HerterVisit for balance issues. Labs checked. TSH was low - dose of synthroid lowered to 88mcg da21m Physical therapy with home health ordered.  09/07/2021 - Fam Med - Phone Call. Started cephalexin 500mg bid 57m7 days for UTI.     Recent consult visits:  01/17/2022 - Podiatry (Dr Wagoner) SJacqualyn Posey toe ulcer. Moderate decrease on arterial studies noted and referral to vascular surgery was placed however is difficult to get him to and from appointments. Using collagen silver dressing to apply to toes.  12/27/2021 - Podiatry (Dr Wagoner) SJacqualyn Posey toe ulcer between 4th and 5th toe on both feet. Ordered arterial and ABI studies Recommended cleaning wound with Betadine daily. Prescribed doxycycline 100mg twice62may for 7 days.  12/04/2021 - Podiatry (Dr Galaway) SeElisha Ponderonychomycosis and diabetic ulcer of toe. Debrided and applied silvadene cream. Ordered wound care with home health. F/U 3 weeks 11/15/2021 - Oncology (Dr  Kale) F/U abnormal SPEP. Discussed stool softener and magnesium due to c/o diarrhea. Consider stool hardener like calcium  polycarbophil. Suspects MGUS. Labs checked. F/U 4 months.  10/22/2021 - Nephrology (Dr Zenda Alpers) Initial work up of elevated serum creatinine. Checked labs - Scr was increased more. Repeat CMP in 2 weeks. Also noted small M Spike, considering referral to hematology for evaluation. 10/17/2021 - Cardio (Dr Geraldo Pitter) F/U aneurysm of ascending aorta and CAD. Disucssed secondary prevention. Aneurysm stable. No medication changes. F/U 9 months.  10/15/2021 - Urology. Seen for gross hematuria. . Thought to be related to indwelling catheter. Checked urine culture and sensitivity. F/U 1 month 10/08/2021 - Urology. Seen for f/u urinary retention. Foley catherter placed during hospitaliztion to relieve urinary retention.  09/24/2021 - Podiatry (Dr Elisha Ponder) Seen for routine foot care.    [facility] visits:  07/31/2021 thru 08/25/2021 - In SNF at [place] [facility] following hospitalization. (Exetimibe no longer on medication list after discharge from SNF. 07/25/2021 to 07/31/2021 [place] [facility]mission at [facility] for urinary retention/ aspiration pneumonia/ fecal impaction. Discharged with Foley cath. New medications started: Levaquin 529m every OTHER day thru 08/01/2021; mention of Miralax every other day to prevent constipation.     Objective:  Lab Results  Component Value Date   CREATININE 2.14 (H) 12/12/2021   CREATININE 2.21 (H) 11/15/2021   CREATININE 1.8 (A) 09/12/2021    Lab Results  Component Value Date   HGBA1C 6.7 (H) 12/12/2021   Last diabetic Eye exam: No results found for: "HMDIABEYEEXA"  Last diabetic Foot exam: No results found for: "HMDIABFOOTEX"      Component Value Date/Time   CHOL 110 12/12/2021 0959   CHOL 122 04/26/2021 1048   TRIG 149.0 12/12/2021 0959   HDL 41.20 12/12/2021 0959   HDL 45 04/26/2021 1048   CHOLHDL 3 12/12/2021 0959   VLDL 29.8 12/12/2021 0959   LDLCALC 39 12/12/2021 0959   LDLCALC 56 04/26/2021 1048   LDLDIRECT 165.3 10/08/2010 1008        Latest Ref Rng & Units 12/12/2021    9:59 AM 11/15/2021   12:43 PM 09/12/2021   12:00 AM  Hepatic Function  Total Protein 6.0 - 8.3 g/dL 6.9  7.6    Albumin 3.5 - 5.2 g/dL 4.0  4.2  3.4      AST 0 - 37 U/L 10  13    ALT 0 - 53 U/L 10  12    Alk Phosphatase 39 - 117 U/L 79  81    Total Bilirubin 0.2 - 1.2 mg/dL 0.6  0.6       This result is from an external source.    Lab Results  Component Value Date/Time   TSH 0.19 (L) 12/12/2021 09:59 AM   TSH 1.95 06/26/2021 11:45 AM       Latest Ref Rng & Units 12/12/2021    9:59 AM 11/15/2021   12:43 PM 09/12/2021   12:00 AM  CBC  WBC 4.0 - 10.5 K/uL 6.5  7.6  5.8      Hemoglobin 13.0 - 17.0 g/dL 12.9  12.5  11.7      Hematocrit 39.0 - 52.0 % 39.7  37.6  36      Platelets 150.0 - 400.0 K/uL 213.0  228  189         This result is from an external source.    Lab Results  Component Value Date/Time   VD25OH 24.39 (L) 06/02/2020 11:17 AM   VD25OH 35 07/12/2010  08:35 PM    Clinical ASCVD: Yes  The ASCVD Risk score (Arnett DK, et al., 2019) failed to calculate for the following reasons:   The 2019 ASCVD risk score is only valid for ages 79 to 17    Other:  DEXA DualFemur Total Mean 09/05/2020 94.0 N/A -1.9 0.767 g/cm2 DualFemur Total Mean 07/01/2018 91.8 N/A -1.7 0.792 g/cm2 DualFemur Total Mean 06/11/2016 89.7 N/A -1.7 0.791 g/cm2   Left Forearm Radius 33% 09/05/2020 94.0 N/A -3.3 0.662 g/cm2 Left Forearm Radius 33% 07/01/2018 91.8 N/A -2.9 0.704 g/cm2 Left Forearm Radius 33% 06/11/2016 89.7 N/A -3.0 0.697 g/cm2  Social History   Tobacco Use  Smoking Status Never  Smokeless Tobacco Never   BP Readings from Last 3 Encounters:  11/15/21 132/69  10/17/21 130/60  07/31/21 (!) 174/84   Pulse Readings from Last 3 Encounters:  11/15/21 66  10/17/21 72  07/31/21 70   Wt Readings from Last 3 Encounters:  10/17/21 143 lb (64.9 kg)  07/29/21 162 lb 0.6 oz (73.5 kg)  06/26/21 151 lb 6.4 oz (68.7 kg)    Assessment: Review of  patient past medical history, allergies, medications, health status, including review of consultants reports, laboratory and other test data, was performed as part of comprehensive evaluation and provision of chronic care management services.   SDOH:  (Social Determinants of Health) assessments and interventions performed:  SDOH Interventions    Flowsheet Row Most Recent Value  SDOH Interventions   Financial Strain Interventions Intervention Not Indicated        CCM Care Plan  Allergies  Allergen Reactions   Penicillins Other (See Comments)    Unknown reaction Did it involve swelling of the face/tongue/throat, SOB, or low BP? Unknown Did it involve sudden or severe rash/hives, skin peeling, or any reaction on the inside of your mouth or nose? Unknown Did you need to seek medical attention at a [facility] or doctor's office? Unknown When did it last happen?    1940   If all above answers are "NO", may proceed with cephalosporin use.    Medications Reviewed Today     Reviewed by [name], RPH-CPP (Pharmacist) on 02/13/22 at 53  Med List Status: <None>   Medication Order Taking? Sig Documenting Provider Last Dose Status Informant  acetaminophen (TYLENOL) 500 MG tablet [id number] Yes Take 1,000 mg by mouth every 6 (six) hours as needed for mild pain. [provider] Taking Active Child  aspirin EC 81 MG tablet [id number] Yes Take 1 tablet (81 mg total) by mouth daily at 12 noon.  Patient taking differently: Take 81 mg by mouth every morning.   Modena Jansky, MD Taking Active Child  atorvastatin (LIPITOR) 80 MG tablet [id number] Yes TAKE ONE TABLET (80MG TOTAL) BY MOUTH DAILY Park Liter, MD Taking Active Child  carboxymethylcellulose (REFRESH PLUS) 0.5 % SOLN [id number] Yes 1 drop daily as needed (dry eyes). [provider] Taking Active Child  carvedilol (COREG) 3.125 MG tablet [id number] Yes TAKE ONE TABLET (3.125MG TOTAL) BY MOUTHTWICE DAILY WITH A  MEAL Lowne Koren Shiver, DO Taking Active   lansoprazole (PREVACID) 30 MG capsule [id number] Yes TAKE ONE CAPSULE (30MG TOTAL) BY MOUTH TWICE DAILY BEFORE A MEAL [name], DO Taking Active   levothyroxine (SYNTHROID) 88 MCG tablet [id number] No Take 1 tablet (88 mcg total) by mouth daily. [name], DO Unknown Active            Med Note (Antony Contras, Myranda Pavone B  Wed Feb 13, 2022 11:16 AM) Patient's daughter Malachy Mood will check to see if patient is taking 167mg or 827m  Multiple Vitamins-Minerals (PRESERVISION AREDS 2) CAPS [id number] Take 1 capsule by mouth in the morning. [provider] Taking Active Child  nitroGLYCERIN (NITROSTAT) 0.4 MG SL tablet [id number] Place 0.4 mg under the tongue every 5 (five) minutes as needed for chest pain. [provider] Taking Active Child  ranolazine (RANEXA) 500 MG 12 hr tablet [id number] TAKE ONE TABLET (500 MG TOTAL) BY MOUTH ONCE DAILY Revankar, RaReita ClicheMD Taking Active Child  Vitamin D, Ergocalciferol, (DRISDOL) 1.25 MG (50000 UNIT) CAPS capsule [id number] TAKE ONE CAPSULE (50000 UNITS TOTAL) BY MOUTH EVERY 7 DAYS LoAnn HeldDO Taking Active            Med Note (EAntony ContrasTAMMY B   Wed Oct 24, 2021  9:34 AM) Takes Monday mornings            Patient Active Problem List   Diagnosis Date Noted   Balance problem 11/29/2021   PNA (pneumonia) 07/27/2021   Fecal impaction (HCNew Munich01/11/2021   Acute kidney injury superimposed on CKD (HCMilan01/11/2021   Urinary retention 07/26/2021   Frequent falls 07/26/2021   Hypokalemia 07/26/2021   Right lower lobe pneumonia 07/26/2021   Traumatic subdural hemorrhage with loss of consciousness of unspecified duration, initial encounter (HCRed Bank12/12/2020   Diarrhea 06/26/2021   Moderate aortic regurgitation 03/13/2021   Ascending aortic aneurysm (HCKindred08/23/2022   Pedal edema 03/13/2021   Adenomatous colon polyp    CAD (coronary artery disease)    Cardiac  murmur    Coronary artery disease    Diverticulosis    GERD (gastroesophageal reflux disease)    History of hiatal hernia    Hyperlipidemia associated with type 2 diabetes mellitus (HCVernon   Prostate cancer (HCCharlton Heights   Osteoarthritis    Intermittent claudication (HCMagnolia11/06/2020   Slow transit constipation 08/10/2019   SDH (subdural hematoma) (HCMount Vernon03/03/2019   Seizure-like activity (HCTulare03/03/2019   Right arm weakness 09/28/2018   Preventative health care 09/23/2018   Type 2 diabetes mellitus (HCWadsworth03/10/2018   Uncontrolled diabetes mellitus with hyperglycemia (HCAustell03/10/2018   Osteopenia 09/25/2017   Hypertension associated with diabetes (HCRennerdale03/01/2018   Hyperglycemia 09/25/2017   Hypothyroidism 09/25/2017   Primary hypertension 09/25/2017   CAD (coronary artery disease), native coronary artery 04/29/2017   Nonsustained ventricular tachycardia (HCShawnee10/10/2016   Angina pectoris (HCBannock10/2018   Ischemic cardiomyopathy 04/10/2017   Mixed dyslipidemia 04/10/2017   Abnormal EKG 04/02/2017   Idiopathic peripheral neuropathy 10/11/2016   Proximal leg weakness 10/11/2016   Weakness of both lower extremities 06/11/2016   Loss of height 06/11/2016   De Quervain's tenosynovitis 05/18/2012   Family history of malignant neoplasm of gastrointestinal tract 05/14/2011   NEVI, MULTIPLE 07/12/2010   OSTEOARTHRITIS 07/12/2010   UNSPECIFIED VITAMIN D DEFICIENCY 02/11/2008   HIATAL HERNIA WITH REFLUX 02/11/2008   GASTROENTERITIS 01/20/2008   PERSONAL HISTORY OF COLONIC POLYPS 11/17/2007   DIZZINESS 10/15/2007   PROSTATE CANCER, HX OF 10/15/2007    Immunization History  Administered Date(s) Administered   Fluad Quad(high Dose 65+) 04/30/2019, 06/02/2020, 06/26/2021   Influenza Split 05/18/2012, 08/06/2014   Influenza Whole 05/09/2010   Influenza, High Dose Seasonal PF 06/11/2016, 05/02/2017, 04/13/2018   Influenza-Unspecified 05/22/2013   PFIZER(Purple Top)SARS-COV-2 Vaccination  08/26/2019, 09/16/2019, 06/09/2020   Pneumococcal Conjugate-13 08/06/2014   Pneumococcal Polysaccharide-23 07/03/2005    Conditions  to be addressed/monitored: CAD, HTN, HLD, DMII, CKD Stage 3b, GERD, Hypothyroidism, and Osteoporosis; aortic aneurysm; ischemic cardiomyopathy; neuropathy  Care Plan : General Pharmacy (Adult)  Updates made by [name], RPH-CPP since 02/13/2022 12:00 AM     Problem: Chronic conditions: type 2 DM; HTN; HDL; CAD; ischemic cardiomyopathy; aortic aneurysm; CKD - 3b; osteoporosis; hypothyroidism; GERD   Priority: High  Onset Date: 09/03/2021     Long-Range Goal: Provide education, support and care coordination for medication therapy and chronic conditions   Start Date: 09/03/2021  Priority: High  Note:   Current Barriers:  Unable to independently monitor therapeutic efficacy Unable to maintain control of Type 2 diabetes Unable to self administer medications as prescribed   Multiple co morbidities Complex medication regimen  Pharmacist Clinical Goal(s):  Over the next 60 days, patient will achieve adherence to monitoring guidelines and medication adherence to achieve therapeutic efficacy maintain control of type 2 diabetes as evidenced by A1c < 8.0% (given patient's age and comorbid conditions)  Review medication list and need for medication adjustments based on renal function  through collaboration with PharmD and provider.   Interventions: 1:1 collaboration with Carollee Herter, Alferd Apa, DO regarding development and update of comprehensive plan of care as evidenced by provider attestation and co-signature Inter-disciplinary care team collaboration (see longitudinal plan of care) Comprehensive medication review performed; medication list updated in electronic medical record  Diabetes: Controlled; Last A1c was 6.7% (give patient's age and co-morbid conditions would recommended A1c < 8.0%) current treatment: None currently Metformin stopped in December  2022 due to diarrhea Current glucose readings: checked 1 or 2 times per week by home health nurse.  Noted to have cardiomyopathy. Last EF was 55 to 60% (02/11/2019) Interventions:  Educated about goal A1c Reviewed home blood glucose goals  Fasting blood glucose goal (before meals) = 80 to 130 Blood glucose goal after a meal = less than 180  Continue to check blood glucose 1 or 2 times per week and record If pharmacotherapy is started could consider SGLT2 due to decreased renal function though at lower eGFR SGLTs have less blood glucose lowering effect.  Patient see nephrologist at [facility] and followed by cardiology Farxiga:  If estimated glomerular filtration rate (eGFR) is 25 mL/min/1.73 m(2) or greater: 10 mg orally once daily, but improvements in glycemic control likely to be ineffective if eGFR is less than 45 mL/min/1.73 m(2).  Jardiance: Estimated GFR 20 mL/min/1.73 m(2) or greater: 10 mg orally once daily may have a beneficial effect on cardiovascular health and slow the rate of kidney function decline; Glycemic Control in Patients Without Established Cardiovascular Disease or Cardiovascular Risk Factors. Estimated GFR less than 30 mL/min/1.73 m(2): Use not recommended  Hypertension: Controlled; blood pressure goal < 140/90 Current treatment: Carvedilol 3.18m twice a day Current home readings: daughter did not have specific readings but reported that home health nurse checks 1 to 2 times per week and "blood pressure has been good" Denies hypotensive/hypertensive symptoms Interventions:  Discussed blood pressure goal with family Continue current therapy for blood pressure   Hyperlipidemia / CAD: Controlled; LDL goal <70 Current treatment: Atorvastatin 881mdaily  Aspirin 8110maily  Ranolazine 500m28mce a day Patient had taken ezetimibe 10mg83mly in past but per family they were told when he left SNF that ezetimibe was not needed.  Last LDL was 33 (when he was taking  both atorvastatin and ezetimibe)  Daughter report difficulty swallowing large tablets like atorvastatin 80mg 24mranolazine Interventions:  Discussed  benefits of atorvastatin / statins. Suggested could change to smaller atorvastatin 85m tablet or rosuvastatin 236mtablet. Patients daughter to discuss with cardiologist. Also recommended she discuss possible trial off ranolazine.  Education provided about LDL goal  Osteoporosis: Current treatment: Calcium carbonate 50045maily  Vitamin D 50,000 units weekly Last DEXA:  DualFemur Total Mean 09/05/2020 -1.9  DualFemur Total Mean 07/01/2018  -1.7  DualFemur Total Mean 06/11/2016 -1.7  Left Forearm Radius 33% 09/05/2020  -3.3  Left Forearm Radius 33% 07/01/2018 -2.9  Left Forearm Radius 33% 06/11/2016  -3.0  Last Scr = 1.65 and estimate GFR was 38; Estimated creatinine Clearance = 18 mL/min Having difficulty swallowing even petite sized calcium supplement.  Interventions:  Continue off alendronate since CrCl is less than 36m14mn Recommended stopping calcium supplement.  Continue vitamin D weekly   Chronic Kidney Disease - 3B Last Scr = 2.16 and estimate GFR was 28 Interventions:  Continue off alendronate since CrCl is less than 36mL52m Follow up with CarolCommodoreey as planned   Medication management Pharmacist Clinical Goal(s): Over the next 90 days, patient will work with PharmD and providers to maintain optimal medication adherence [facility] coverage. Cost now with penalties would be high. He is on generics and per daughter cost is affordable.  Per pharmacy last filled levothyroxine 100mcg57m patient is suppose to take 88mcg 106my.  Interventions Comprehensive medication review performed. Reviewed refill history and assessed adherence Continue current medication management strategy Spoke with daughter and she will verify which strength of levothyroxine patient  has been taking. If not 88mcg w4mstart. Due to recheck TSH in 4 weeks.   Patient Goals/Self-Care Activities Over the next 60 days, patient will:  take medications as prescribed,  check glucose 1 to 2 times per week, document, and provide at future appointments,  collaborate with provider on medication access solutions Continue to work with home health with occupation and physical therapy Follow up with nephrologist / [place] planned Separate levothyroxine (take each morning) and calcium supplement (take in evening). Take levothyroxine 88mcg da41m Stop calcium supplement since difficult to swallow  Follow Up Plan: Telephone follow up appointment with care management team member scheduled for:  2 to 3 months.           Medication Assistance: None required.  Patient affirms current coverage meets needs.  [place] [id number] [phone] RODelene Ruffini [phone] [address][id number] - [id number],[place] [phone] [place]HiWhite [phone]? Yes Daughter assist with medication management  Follow Up:  Patient agrees to Care Plan and Follow-up.  Plan: Telephone follow up appointment with care management team member scheduled for:  8 weeks  Kaylani Fromme EckaCherre Robinslinical Pharmacist [place] [facility]

## 2022-02-13 NOTE — Patient Instructions (Signed)
Mr. Serviss It was a pleasure speaking with you  Below is a summary of your health goals and care plan   Patient Goals/Self-Care Activities take medications as prescribed,  check glucose 1 to 2 times per week, document, and provide at future appointments,  collaborate with provider on medication access solutions Continue to work with home health with occupation and physical therapy Follow up with nephrologist / [name] as planned Separate levothyroxine (take each morning) and calcium supplement (take in evening). Take levothyroxine 45mg daily  Stop calcium supplement since difficult to swallow   If you have any questions or concerns, please feel free to contact me either at the phone number below or with a MyChart message.   Keep up the good work!  TCherre Robins PharmD Clinical Pharmacist LM[facility] HospitalPrimary [facility] SW [phone](direct line)  [phone](main office number)   PPeach [place]  Interventions: 1:1 collaboration with LCarollee Herter YAlferd Apa DO regarding development and update of comprehensive plan of care as evidenced by provider attestation and co-signature Inter-disciplinary care team collaboration (see longitudinal plan of care) Comprehensive medication review performed; medication list updated in electronic medical record  Diabetes: Controlled; Lab Results  Component Value Date   HGBA1C 6.7 (H) 12/12/2021   current treatment: None currently Interventions:  Reviewed A1c goal - will see if home health can draw A1c Reviewed home blood glucose goals  Fasting blood glucose goal (before meals) = 80 to 130 Blood glucose goal after a meal = less than 180  Check blood glucose 1 or 2 times per week  Hypertension: Variable blood pressure goal < 140/90 BP Readings from Last 3 Encounters:  11/15/21 132/69  10/17/21 130/60  07/31/21 (!) 174/84   Current treatment: Carvedilol 3.'125mg'$  twice a  day Interventions:  Discussed blood pressure goal with family Continue current therapy for blood pressure  Check blood pressure at home 2 to 3 times per week and record (home health can check)   Hyperlipidemia / heart disease: Controlled; LDL goal <70 Lab Results  Component Value Date   LDLCALC 39 12/12/2021   Current treatment: Atorvastatin '80mg'$  daily  Aspirin '81mg'$  daily  Ranolazine '500mg'$  once a day Interventions:  Discussed importance of atorvastatin / statin therapy. Discussed with cardiologist about changing to smaller tablet - atorvastatin '40mg'$  or rosuvastatin '20mg'$ ; Also discussed with cardiologist about trial off ranolazine.   Education provided about LDL goal  Osteoporosis: Current treatment: Calcium '500mg'$  daily  Vitamin D weekly Interventions:  Continue vitamin D supplementation. Stop calcium supplement since difficult to swallow    Patient verbalizes understanding of instructions and care plan provided today and agrees to view in [place] status and patient understanding of how to access instructions and care plan via MyChart confirmed with patient.

## 2022-02-17 NOTE — Progress Notes (Signed)
Subjective:  Patient ID: Evan Wright, male    DOB: [birth year],  MRN: [id number]  Evan Wright presents to clinic today for follow up ulcer left 5th toe, at risk foot care with history of diabetic neuropathy, and painful elongated mycotic toenails 1-5 bilaterally which are tender when wearing enclosed shoe gear. Pain is relieved with periodic professional debridement.  Last A1c was 6.7%.  Patient did not check blood glucose today.  New problem(s): None.   His daughter is present during today's visit. They have purchased new diabetic house slippers with velcro closures. Mr. Rosenfield states he likes them. He relates no foot pain today. Home Health has been coming out to change his dressings.  PCP is Carollee Herter, Alferd Apa, DO , and last visit was  Nov 29, 2021  Allergies  Allergen Reactions   Penicillins Other (See Comments)    Unknown reaction Did it involve swelling of the face/tongue/throat, SOB, or low BP? Unknown Did it involve sudden or severe rash/hives, skin peeling, or any reaction on the inside of your mouth or nose? Unknown Did you need to seek medical attention at a hospital or doctor's office? Unknown When did it last happen?    1940   If all above answers are "NO", may proceed with cephalosporin use.    Review of Systems: Negative except as noted in the HPI.  Objective: No changes noted in today's physical examination.  There were no vitals filed for this visit.  Evan Wright is a pleasant 88 y.o. male thin build in NAD.[name] AAO X 3.  Vascular Examination: CFT <3 seconds b/l LE. Faintly palpable pedal pulses b/l. Faintly palpable PT pulse(s) b/l LE. Pedal hair absent. No pain with calf compression b/l. Lower extremity skin temperature gradient within normal limits. No edema noted b/l LE. No ischemia or gangrene noted b/l LE. No cyanosis or clubbing noted b/l LE.  Dermatological Examination: Pedal skin thin and atrophic b/l LE. No interdigital macerations b/l lower  extremities. Toenails 1-5 b/l elongated, discolored, dystrophic, thickened, crumbly with subungual debris and tenderness to dorsal palpation. No hyperkeratotic nor porokeratotic lesions present on today's visit. Pedal skin noted to be dry b/l lower extremities. Right great toenails abutting skin of right 2nd toe. No break in skin, no erythema, no edema, no drainage, no fluctuance.   Wounds right 4th and 5th digits completely epithelialized.   Musculoskeletal Examination: Noted disuse atrophy bilaterally. HAV with bunion deformity noted b/l LE. Hammertoe deformity noted 2-5 b/l. Patient using wheelchair for mobility assistance.  Neurological Examination: Protective sensation diminished with 10g monofilament b/l.    Latest Ref Rng & Units 12/12/2021    9:59 AM 11/08/2021    3:05 PM 06/26/2021   11:45 AM  Hemoglobin A1C  Hemoglobin-A1c 4.6 - 6.5 % 6.7  6.7  7.1    Assessment/Plan: 1. Pain due to onychomycosis of toenails of both feet   2. Healed ulcer of left foot   3. PAD (peripheral artery disease) ([place])   4. Diabetic peripheral neuropathy associated with type 2 diabetes mellitus ([place])     -Patient was evaluated and treated. All patient's and/or POA's questions/concerns answered on today's visit. -Examined patient. -Right 4th and 5th toe ulcers have healed. Apply betadine solution to toes once daily for one week. Continue foam toe separator once daily. Continue house slippers. -Mycotic toenails 1-5 bilaterally were debrided in length and girth with sterile nail nippers and dremel without incident. -Patient/POA to call should there be question/concern in the interim.  Return in about 9 weeks (around 04/16/2022).  Marzetta Board, DPM

## 2022-02-18 DIAGNOSIS — E785 Hyperlipidemia, unspecified: Secondary | ICD-10-CM | POA: Diagnosis not present

## 2022-02-18 DIAGNOSIS — E039 Hypothyroidism, unspecified: Secondary | ICD-10-CM

## 2022-02-18 DIAGNOSIS — I10 Essential (primary) hypertension: Secondary | ICD-10-CM

## 2022-02-18 DIAGNOSIS — E1169 Type 2 diabetes mellitus with other specified complication: Secondary | ICD-10-CM | POA: Diagnosis not present

## 2022-02-27 ENCOUNTER — Telehealth: Payer: Self-pay | Admitting: Family Medicine

## 2022-02-27 NOTE — Telephone Encounter (Signed)
Caller/Agency: Marjean Donna Number: [phone] Requesting OT/PT/Skilled Nursing/Social Work/Speech Therapy: urinalysis  Frequency: Lorriane Shire would like verbal order for urinalysis tomorrow when she goes out due to patient complaining of burning issues with catheter.   Please call to advise

## 2022-02-28 ENCOUNTER — Encounter: Payer: Self-pay | Admitting: Family Medicine

## 2022-02-28 DIAGNOSIS — R300 Dysuria: Secondary | ICD-10-CM | POA: Diagnosis not present

## 2022-02-28 DIAGNOSIS — N189 Chronic kidney disease, unspecified: Secondary | ICD-10-CM | POA: Diagnosis not present

## 2022-02-28 NOTE — Telephone Encounter (Signed)
Venessa called back to follow up on verbal orders. Heather gave verbals to her in passing.

## 2022-03-04 ENCOUNTER — Other Ambulatory Visit (INDEPENDENT_AMBULATORY_CARE_PROVIDER_SITE_OTHER): Payer: Medicare Other

## 2022-03-04 DIAGNOSIS — E039 Hypothyroidism, unspecified: Secondary | ICD-10-CM | POA: Diagnosis not present

## 2022-03-05 LAB — THYROID PANEL WITH TSH
Free Thyroxine Index: 3 (ref 1.4–3.8)
T3 Uptake: 32 % (ref 22–35)
T4, Total: 9.5 ug/dL (ref 4.9–10.5)
TSH: 0.82 mIU/L (ref 0.40–4.50)

## 2022-03-07 ENCOUNTER — Telehealth: Payer: Self-pay | Admitting: Hematology

## 2022-03-07 NOTE — Telephone Encounter (Signed)
Per 8/17 phone line pt called to cancel lab   lab will be done thought at home nurse, ok per Nurse

## 2022-03-08 DIAGNOSIS — E1159 Type 2 diabetes mellitus with other circulatory complications: Secondary | ICD-10-CM | POA: Diagnosis not present

## 2022-03-08 DIAGNOSIS — N189 Chronic kidney disease, unspecified: Secondary | ICD-10-CM | POA: Diagnosis not present

## 2022-03-08 DIAGNOSIS — E1142 Type 2 diabetes mellitus with diabetic polyneuropathy: Secondary | ICD-10-CM | POA: Diagnosis not present

## 2022-03-08 DIAGNOSIS — E1122 Type 2 diabetes mellitus with diabetic chronic kidney disease: Secondary | ICD-10-CM | POA: Diagnosis not present

## 2022-03-08 DIAGNOSIS — E1169 Type 2 diabetes mellitus with other specified complication: Secondary | ICD-10-CM | POA: Diagnosis not present

## 2022-03-08 DIAGNOSIS — L97511 Non-pressure chronic ulcer of other part of right foot limited to breakdown of skin: Secondary | ICD-10-CM | POA: Diagnosis not present

## 2022-03-08 DIAGNOSIS — I10 Essential (primary) hypertension: Secondary | ICD-10-CM | POA: Diagnosis not present

## 2022-03-08 DIAGNOSIS — E782 Mixed hyperlipidemia: Secondary | ICD-10-CM | POA: Diagnosis not present

## 2022-03-08 DIAGNOSIS — I2510 Atherosclerotic heart disease of native coronary artery without angina pectoris: Secondary | ICD-10-CM | POA: Diagnosis not present

## 2022-03-08 DIAGNOSIS — E1165 Type 2 diabetes mellitus with hyperglycemia: Secondary | ICD-10-CM | POA: Diagnosis not present

## 2022-03-08 DIAGNOSIS — I255 Ischemic cardiomyopathy: Secondary | ICD-10-CM | POA: Diagnosis not present

## 2022-03-08 DIAGNOSIS — E039 Hypothyroidism, unspecified: Secondary | ICD-10-CM | POA: Diagnosis not present

## 2022-03-08 DIAGNOSIS — I472 Ventricular tachycardia, unspecified: Secondary | ICD-10-CM | POA: Diagnosis not present

## 2022-03-08 DIAGNOSIS — E11621 Type 2 diabetes mellitus with foot ulcer: Secondary | ICD-10-CM | POA: Diagnosis not present

## 2022-03-08 DIAGNOSIS — Z466 Encounter for fitting and adjustment of urinary device: Secondary | ICD-10-CM | POA: Diagnosis not present

## 2022-03-08 DIAGNOSIS — I351 Nonrheumatic aortic (valve) insufficiency: Secondary | ICD-10-CM | POA: Diagnosis not present

## 2022-03-08 MED ORDER — NITROFURANTOIN MONOHYD MACRO 100 MG PO CAPS: 100.0000 mg | ORAL_CAPSULE | Freq: Two times a day (BID) | ORAL | 0 refills | Status: DC

## 2022-03-08 NOTE — Telephone Encounter (Signed)
Received labs on 03/07/22. Per Dr. Etter Sjogren send in [place]. Rx sent to [facility]. Per Lowne Recheck in 10--14 days. LVM on pt's daughter cell.

## 2022-03-08 NOTE — Addendum Note (Signed)
Addended by: Sanda Linger on: 03/08/2022 03:54 PM   Modules accepted: Orders

## 2022-03-11 ENCOUNTER — Inpatient Hospital Stay: Payer: Medicare Other

## 2022-03-14 ENCOUNTER — Encounter: Payer: Self-pay | Admitting: *Deleted

## 2022-03-14 ENCOUNTER — Encounter: Payer: Self-pay | Admitting: Hematology

## 2022-03-14 ENCOUNTER — Ambulatory Visit: Payer: Self-pay | Admitting: *Deleted

## 2022-03-14 DIAGNOSIS — E11621 Type 2 diabetes mellitus with foot ulcer: Secondary | ICD-10-CM | POA: Diagnosis not present

## 2022-03-14 NOTE — Patient Outreach (Signed)
  Care Coordination   Initial Visit Note   03/14/2022 Name: Evan Wright MRN: [id number] DOB: 1925-07-03  Evan Wright is a 88 y.o. year old male who sees Carollee Herter, Alferd Apa, DO for primary care. I spoke with  Evan Wright daughter/ caregiver Malachy Mood, on [facility] DPR by phone today  What matters to the patients health and wellness today?  Per caregiver/ daughter Malachy Mood, on [facility] DPR: "Overall things are going well; he has private duty caregivers at home each day, and home health is coming out weekly for PT and nurse visits; we are managing well and understand to call his PCP for any needs or problems"    Goals Addressed             This Visit's Progress    COMPLETED: Care Coordination Activities- no follow up required       Care Coordination Interventions: Evaluation of current treatment plan related to health maintenance in aging adult and patient's adherence to plan as established by provider Advised patient to provide appropriate vaccination information to provider or CM team member at next visit Reviewed scheduled/upcoming provider appointments including 03/18/22- oncology provider; 03/26/22- clinic pharmacy telephone Assessed social determinant of health barriers Provided education around need for annual flu vaccine, purpose/ value of Medicare Annual Wellness visit- caregiver reports patient currently receiving home health services; she will consider scheduling but declines today- states patient has regular (weekly) home health visits and private duty caregivers daily; she denies unmet care coordination needs           SDOH assessments and interventions completed:  Yes  SDOH Interventions Today    Flowsheet Row Most Recent Value  SDOH Interventions   Food Insecurity Interventions Intervention Not Indicated  Transportation Interventions Intervention Not Indicated  [family provides transportation]       Care Coordination Interventions Activated:  Yes  Care Coordination  Interventions:  Yes, provided   Follow up plan: No further intervention required.   Encounter Outcome:  Pt. Visit Completed   Oneta Rack, RN, BSN, [place] Management [phone]: direct office

## 2022-03-14 NOTE — Patient Instructions (Signed)
Visit Information  Thank you for taking time to visit with me today. Please don't hesitate to contact me if I can be of assistance to you.   Following are the goals we discussed today:   Goals Addressed             This Visit's Progress    COMPLETED: Care Coordination Activities- no follow up required       Care Coordination Interventions: Evaluation of current treatment plan related to health maintenance in aging adult and patient's adherence to plan as established by provider Advised patient to provide appropriate vaccination information to provider or CM team member at next visit Reviewed scheduled/upcoming provider appointments including 03/18/22- oncology provider; 03/26/22- clinic pharmacy telephone Assessed social determinant of health barriers Provided education around need for annual flu vaccine, purpose/ value of Medicare Annual Wellness visit- caregiver reports patient currently receiving home health services; she will consider scheduling but declines today- states patient has regular (weekly) home health visits and private duty caregivers daily; she denies unmet care coordination needs           If you are experiencing a Mental Health or [place] or need someone to talk to, please  call the Suicide and Crisis Lifeline: 988 call the [place] National Suicide Prevention Lifeline: 417-375-6644 or TTY: (989) 153-8702 TTY 507-419-8516) to talk to a trained counselor call 1-800-273-TALK (toll free, 24 hour hotline) go to [facility] [address] [place] [phone]) call the [place]: 406-297-7435 call 911   The patient verbalized understanding of instructions, educational materials, and care plan provided today and DECLINED offer to receive copy of patient instructions, educational materials, and care plan.   No further follow up required: NO ongoing care coordination needs  Oneta Rack,  RN, BSN, [place] Management [phone]: direct office

## 2022-03-18 ENCOUNTER — Inpatient Hospital Stay: Payer: Medicare Other | Admitting: Hematology

## 2022-03-22 ENCOUNTER — Telehealth: Payer: Self-pay | Admitting: Family Medicine

## 2022-03-22 NOTE — Telephone Encounter (Signed)
Verbal given 

## 2022-03-22 NOTE — Telephone Encounter (Signed)
Caller/Agency: Mickel Baas (PT) [place] Number: [phone] Requesting OT/PT/Skilled Nursing/Social Work/Speech Therapy: Patient missed his Physical Therapy appointment today because he's not feeling well. Requesting verbal orders to move appointment to next week.  Frequency: n/a

## 2022-03-26 ENCOUNTER — Telehealth: Payer: Medicare Other

## 2022-03-27 ENCOUNTER — Telehealth: Payer: Self-pay | Admitting: Family Medicine

## 2022-03-27 NOTE — Telephone Encounter (Signed)
Caller/Agency: Cristino Martes, [place] Number: [phone] Requesting OT/PT/Skilled Nursing/Social Work/Speech Therapy: Home health to continue for 8 weeks Frequency: 8 weeks

## 2022-03-28 NOTE — Telephone Encounter (Signed)
Verbal given 

## 2022-03-29 ENCOUNTER — Telehealth: Payer: Self-pay | Admitting: Family Medicine

## 2022-03-29 ENCOUNTER — Ambulatory Visit (INDEPENDENT_AMBULATORY_CARE_PROVIDER_SITE_OTHER): Payer: Medicare Other | Admitting: Pharmacist

## 2022-03-29 DIAGNOSIS — E1169 Type 2 diabetes mellitus with other specified complication: Secondary | ICD-10-CM

## 2022-03-29 DIAGNOSIS — R29898 Other symptoms and signs involving the musculoskeletal system: Secondary | ICD-10-CM

## 2022-03-29 DIAGNOSIS — I10 Essential (primary) hypertension: Secondary | ICD-10-CM

## 2022-03-29 DIAGNOSIS — I25118 Atherosclerotic heart disease of native coronary artery with other forms of angina pectoris: Secondary | ICD-10-CM

## 2022-03-29 NOTE — Telephone Encounter (Signed)
Verbal order given for UA and culture. FYI

## 2022-03-29 NOTE — Patient Instructions (Signed)
Mr. Hartel It was a pleasure speaking with you today.  Below is a summary of your health goals and summary of our recent visit. You can also view your updated Chronic Care Management Care plan through your MyChart account.    Patient Goals/Self-Care Activities Over the next 60 days, patient will:  take medications as prescribed,  check glucose 1 to 2 times per week, document, and provide at future appointments,  collaborate with provider on medication access solutions I will check with cardiologist about atorvastatin and ranolazine since you are having difficulty swallowing these larger tablets.  Continue to work with home health with occupation and physical therapy Ask if [place] might be able to provide flu vaccine in home. If not call me and I will check other options.  Follow up with nephrologist / [name] as planned Separate levothyroxine (take each morning) and calcium supplement (take in evening). Take levothyroxine 43mg daily  Try either Ensure Plant based replacement and Owyn (both are available in some grocery stores and thru [employer]  Follow Up Plan: Telephone follow up appointment with care management team member scheduled for:  2 to 3 months.     As always if you have any questions or concerns especially regarding medications, please feel free to contact me either at the phone number below or with a MyChart message.   Keep up the good work!  TCherre Robins PharmD Clinical Pharmacist [facility] LLCPrimary Care SW M[phone](direct line)  [phone](main office number)   {CCM PT PRINT INSTRUCTIONS:[id number]}

## 2022-03-29 NOTE — Chronic Care Management (AMB) (Unsigned)
Chronic Care Management Pharmacy Note  03/29/2022 Name:  Evan Wright MRN:  [id number] DOB:  May 20, 1925  Summary: Evan Wright with patient's daughter, Evan Wright. She reports patient still having difficulty swallowing some tablets. She has not discussed possibly changing atorvastatin 65m - which is a large tablet or possibly holding ranolazine since that tablet is also large / difficult to swallow.  KSantiago Gladalso expresses that Evan Wright not eating much. They have tried several different liquid supplements but Evan Wright that they hurt his stomach because he is lactose intolerant.  They continue to have difficulty getting patient to medical appointments - difficult to get in and out of car.  Recommendations:  Will check with cardiologist about atorvastatin and ranolazine since Evan Wright having difficulty swallowing these larger tablets.  Possibly could change atorvastatin 835mto either 4065mablet or rosuvastatin 71m90mily.  Discussed vaccines history and recommended annual flu vaccine. Suggested they ssk if [facility] be able to provide flu vaccine in home. If not call me and I will check other options.  Recommended trial of either Ensure Plant based replacement and Owyn (both are available in some grocery stores and thru [employer]ollow Up Plan: Telephone follow up appointment with care management team member scheduled for:  2 to 3 months.    Subjective: Evan WEGMANNan 89 y35. year old male who is a primary patient of LownAnn Held.  The CCM team was consulted for assistance with disease management and care coordination needs.    Engaged with patient's daughter, Evan Wright for follow up visit in response to provider referral for pharmacy case management and/or care coordination services.   Consent to Services:  The patient was given information about Chronic Care Management services, agreed to services, and gave verbal consent prior to initiation of  services.  Please see initial visit note for detailed documentation.   Patient Care Team: LownCarollee HerteronAlferd Apa as PCP - General DigbCalvert Cantor as Consulting Physician (Ophthalmology) TannCarolan Clines (Inactive) as Consulting Physician (Urology) AquiCameron Sprang as Consulting Physician (Neurology) EckaCherre RobinsH-Millers[place]armacist)  Recent office visits: 03/08/2022 - Phone Call - Prescribed Macrobid 100mg48mce a day for 7 days for UTI. Recheck urine culture in 10 to 14 days (order to home health)  12/12/2021 - Fam Med (Dr LowneCarollee Hertereo Visit for balance issues. Labs checked. TSH was low - dose of synthroid lowered to 88mcg63mly. Physical therapy with home health ordered.     Recent consult visits:  01/17/2022 - Podiatry (Dr WagoneJacqualyn Posey for toe ulcer. Moderate decrease on arterial studies noted and referral to vascular surgery was placed however is difficult to get him to and from appointments. Using collagen silver dressing to apply to toes.  12/27/2021 - Podiatry (Dr WagoneJacqualyn Posey for toe ulcer between 4th and 5th toe on both feet. Ordered arterial and ABI studies Recommended cleaning wound with Betadine daily. Prescribed doxycycline 100mg t48m a day for 7 days.  12/04/2021 - Podiatry (Dr GalawayElisha Ponderfor onychomycosis and diabetic ulcer of toe. Debrided and applied silvadene cream. Ordered wound care with home health. F/U 3 weeks 11/15/2021 - Oncology (Dr Kale) FIrene Limbobnormal SPEP. Discussed stool softener and magnesium due to c/o diarrhea. Consider stool hardener like calcium polycarbophil. Suspects MGUS. Labs checked. F/U 4 months.  10/22/2021 - Nephrology (Dr PeeblesZenda Alpersal work up of elevated serum creatinine. Checked labs - Scr was increased more. Repeat CMP  in 2 weeks. Also noted small M Spike, considering referral to hematology for evaluation. 10/17/2021 - Cardio (Dr Geraldo Pitter) F/U aneurysm of ascending aorta and CAD. Disucssed secondary prevention. Aneurysm  stable. No medication changes. F/U 9 months.  10/15/2021 - Urology. Seen for gross hematuria. . Thought to be related to indwelling catheter. Checked urine culture and sensitivity. F/U 1 month 10/08/2021 - Urology. Seen for f/u urinary retention. Foley catherter placed during hospitaliztion to relieve urinary retention.    Hospital visits:  None in last 6 months.     Objective:  Lab Results  Component Value Date   CREATININE 2.14 (H) 12/12/2021   CREATININE 2.21 (H) 11/15/2021   CREATININE 1.8 (A) 09/12/2021    Lab Results  Component Value Date   HGBA1C 6.7 (H) 12/12/2021   Last diabetic Eye exam: No results found for: "HMDIABEYEEXA"  Last diabetic Foot exam: No results found for: "HMDIABFOOTEX"      Component Value Date/Time   CHOL 110 12/12/2021 0959   CHOL 122 04/26/2021 1048   TRIG 149.0 12/12/2021 0959   HDL 41.20 12/12/2021 0959   HDL 45 04/26/2021 1048   CHOLHDL 3 12/12/2021 0959   VLDL 29.8 12/12/2021 0959   LDLCALC 39 12/12/2021 0959   LDLCALC 56 04/26/2021 1048   LDLDIRECT 165.3 10/08/2010 1008       Latest Ref Rng & Units 12/12/2021    9:59 AM 11/15/2021   12:43 PM 09/12/2021   12:00 AM  Hepatic Function  Total Protein 6.0 - 8.3 g/dL 6.9  7.6    Albumin 3.5 - 5.2 g/dL 4.0  4.2  3.4      AST 0 - 37 U/L 10  13    ALT 0 - 53 U/L 10  12    Alk Phosphatase 39 - 117 U/L 79  81    Total Bilirubin 0.2 - 1.2 mg/dL 0.6  0.6       This result is from an external source.    Lab Results  Component Value Date/Time   TSH 0.82 03/04/2022 10:53 AM   TSH 0.19 (L) 12/12/2021 09:59 AM       Latest Ref Rng & Units 12/12/2021    9:59 AM 11/15/2021   12:43 PM 09/12/2021   12:00 AM  CBC  WBC 4.0 - 10.5 K/uL 6.5  7.6  5.8      Hemoglobin 13.0 - 17.0 g/dL 12.9  12.5  11.7      Hematocrit 39.0 - 52.0 % 39.7  37.6  36      Platelets 150.0 - 400.0 K/uL 213.0  228  189         This result is from an external source.    Lab Results  Component Value Date/Time   VD25OH  24.39 (L) 06/02/2020 11:17 AM   VD25OH 35 07/12/2010 08:35 PM    Clinical ASCVD: Yes  The ASCVD Risk score (Arnett DK, et al., 2019) failed to calculate for the following reasons:   The 2019 ASCVD risk score is only valid for ages 53 to 61    Other:  DEXA DualFemur Total Mean 09/05/2020 94.0 N/A -1.9 0.767 g/cm2 DualFemur Total Mean 07/01/2018 91.8 N/A -1.7 0.792 g/cm2 DualFemur Total Mean 06/11/2016 89.7 N/A -1.7 0.791 g/cm2   Left Forearm Radius 33% 09/05/2020 94.0 N/A -3.3 0.662 g/cm2 Left Forearm Radius 33% 07/01/2018 91.8 N/A -2.9 0.704 g/cm2 Left Forearm Radius 33% 06/11/2016 89.7 N/A -3.0 0.697 g/cm2  Social History   Tobacco Use  Smoking Status  Never  Smokeless Tobacco Never   BP Readings from Last 3 Encounters:  11/15/21 132/69  10/17/21 130/60  07/31/21 (!) 174/84   Pulse Readings from Last 3 Encounters:  11/15/21 66  10/17/21 72  07/31/21 70   Wt Readings from Last 3 Encounters:  10/17/21 143 lb (64.9 kg)  07/29/21 162 lb 0.6 oz (73.5 kg)  06/26/21 151 lb 6.4 oz (68.7 kg)    Assessment: Review of patient past medical history, allergies, medications, health status, including review of consultants reports, laboratory and other test data, was performed as part of comprehensive evaluation and provision of chronic care management services.   SDOH:  (Social Determinants of Health) assessments and interventions performed:  SDOH Interventions    Flowsheet Row Care Coordination from 03/14/2022 in [place] Management from 02/13/2022 in [place] at [place] Management from 09/03/2021 in [place] at [place] Interventions     Food Insecurity Interventions Intervention Not Indicated -- --  Transportation Interventions Intervention Not Indicated  [family provides transportation] -- Intervention Not Indicated  Financial Strain  Interventions -- Intervention Not Indicated Intervention Not Indicated        CCM Care Plan  Allergies  Allergen Reactions   Lactose Intolerance (Gi) Diarrhea   Penicillins Other (See Comments)    Unknown reaction Did it involve swelling of the face/tongue/throat, SOB, or low BP? Unknown Did it involve sudden or severe rash/hives, skin peeling, or any reaction on the inside of your mouth or nose? Unknown Did you need to seek medical attention at a hospital or doctor's office? Unknown When did it last happen?    1940   If all above answers are "NO", may proceed with cephalosporin use.    Medications Reviewed Today     Reviewed by [name], RPH-CPP (Pharmacist) on 03/29/22 at 1131  Med List Status: <None>   Medication Order Taking? Sig Documenting Provider Last Dose Status Informant  acetaminophen (TYLENOL) 500 MG tablet [id number] Yes Take 1,000 mg by mouth every 6 (six) hours as needed for mild pain. [provider] Taking Active Child  aspirin EC 81 MG tablet [id number] Yes Take 1 tablet (81 mg total) by mouth daily at 12 noon.  Patient taking differently: Take 81 mg by mouth every morning.   Modena Jansky, MD Taking Active Child  atorvastatin (LIPITOR) 80 MG tablet [id number] Yes TAKE ONE TABLET (80MG TOTAL) BY MOUTH DAILY Park Liter, MD Taking Active Child  carboxymethylcellulose (REFRESH PLUS) 0.5 % SOLN [id number] Yes 1 drop daily as needed (dry eyes). [provider] Taking Active Child  carvedilol (COREG) 3.125 MG tablet [id number] Yes TAKE ONE TABLET (3.125MG TOTAL) BY MOUTHTWICE DAILY WITH A MEAL Lowne Koren Shiver, DO Taking Active   lansoprazole (PREVACID) 30 MG capsule [id number] Yes TAKE ONE CAPSULE (30MG TOTAL) BY MOUTH TWICE DAILY BEFORE A MEAL Roma Schanz R, DO Taking Active   levothyroxine (SYNTHROID) 88 MCG tablet [id number] Yes Take 1 tablet (88 mcg total) by mouth daily. [name], DO Taking Active             Med Note Antony Contras, Marchele Decock B   Fri Mar 29, 2022 11:31 AM)    Multiple Vitamins-Minerals (PRESERVISION AREDS 2) CAPS [id number] Yes Take 1 capsule by mouth in the morning. [provider] Taking Active Child  nitroGLYCERIN (NITROSTAT) 0.4 MG SL tablet [id number] Yes Place 0.4 mg  under the tongue every 5 (five) minutes as needed for chest pain. [provider] Taking Active Child  ranolazine (RANEXA) 500 MG 12 hr tablet [id number] Yes TAKE ONE TABLET (500 MG TOTAL) BY MOUTH ONCE DAILY Revankar, Reita Cliche, MD Taking Active Child  Vitamin D, Ergocalciferol, (DRISDOL) 1.25 MG (50000 UNIT) CAPS capsule [id number] Yes TAKE ONE CAPSULE (50000 UNITS TOTAL) BY MOUTH EVERY 7 DAYS [name], DO Taking Active            Med Note Antony Contras, Dessa Phi Oct 24, 2021  9:34 AM) Takes Monday mornings            Patient Active Problem List   Diagnosis Date Noted   Balance problem 11/29/2021   PNA (pneumonia) 07/27/2021   Fecal impaction ([place]) 07/26/2021   Acute kidney injury superimposed on CKD ([place]) 07/26/2021   Urinary retention 07/26/2021   Frequent falls 07/26/2021   Hypokalemia 07/26/2021   Right lower lobe pneumonia 07/26/2021   Traumatic subdural hemorrhage with loss of consciousness of unspecified duration, initial encounter ([place]) 06/26/2021   Diarrhea 06/26/2021   Moderate aortic regurgitation 03/13/2021   Ascending aortic aneurysm ([place]) 03/13/2021   Pedal edema 03/13/2021   Adenomatous colon polyp    CAD (coronary artery disease)    Cardiac murmur    Coronary artery disease    Diverticulosis    GERD (gastroesophageal reflux disease)    History of hiatal hernia    Hyperlipidemia associated with type 2 diabetes mellitus (HCC)    Prostate cancer ([place])    Osteoarthritis    Intermittent claudication ([place]) 06/02/2020   Slow transit constipation 08/10/2019   SDH (subdural hematoma) ([place]) 09/28/2018   Seizure-like activity ([place]) 09/28/2018   Right arm weakness  09/28/2018   Preventative health care 09/23/2018   Type 2 diabetes mellitus ([place]) 09/23/2018   Uncontrolled diabetes mellitus with hyperglycemia ([place]) 09/23/2018   Osteopenia 09/25/2017   Hypertension associated with diabetes ([place]) 09/25/2017   Hyperglycemia 09/25/2017   Hypothyroidism 09/25/2017   Primary hypertension 09/25/2017   CAD (coronary artery disease), native coronary artery 04/29/2017   Nonsustained ventricular tachycardia ([place]) 04/24/2017   Angina pectoris ([place]) 04/2017   Ischemic cardiomyopathy 04/10/2017   Mixed dyslipidemia 04/10/2017   Abnormal EKG 04/02/2017   Idiopathic peripheral neuropathy 10/11/2016   Proximal leg weakness 10/11/2016   Weakness of both lower extremities 06/11/2016   Loss of height 06/11/2016   De Quervain's tenosynovitis 05/18/2012   Family history of malignant neoplasm of gastrointestinal tract 05/14/2011   NEVI, MULTIPLE 07/12/2010   OSTEOARTHRITIS 07/12/2010   UNSPECIFIED VITAMIN D DEFICIENCY 02/11/2008   HIATAL HERNIA WITH REFLUX 02/11/2008   GASTROENTERITIS 01/20/2008   PERSONAL HISTORY OF COLONIC POLYPS 11/17/2007   DIZZINESS 10/15/2007   PROSTATE CANCER, HX OF 10/15/2007    Immunization History  Administered Date(s) Administered   Fluad Quad(high Dose 65+) 04/30/2019, 06/02/2020, 06/26/2021   Influenza Split 05/18/2012, 08/06/2014   Influenza Whole 05/09/2010   Influenza, High Dose Seasonal PF 06/11/2016, 05/02/2017, 04/13/2018   Influenza-Unspecified 05/22/2013   PFIZER(Purple Top)SARS-COV-2 Vaccination 08/26/2019, 09/16/2019, 06/09/2020   Pneumococcal Conjugate-13 08/06/2014   Pneumococcal Polysaccharide-23 07/03/2005    Conditions to be addressed/monitored: CAD, HTN, HLD, DMII, CKD Stage 3b, GERD, Hypothyroidism, and Osteoporosis; aortic aneurysm; ischemic cardiomyopathy; neuropathy  Care Plan : General Pharmacy (Adult)  Updates made by [name], RPH-CPP since 03/29/2022 12:00 AM     Problem: Chronic conditions: type  2 DM; HTN; HDL; CAD; ischemic cardiomyopathy; aortic aneurysm; CKD -  3b; osteoporosis; hypothyroidism; GERD   Priority: High  Onset Date: 09/03/2021     Long-Range Goal: Provide education, support and care coordination for medication therapy and chronic conditions   Start Date: 09/03/2021  Priority: High  Note:   Current Barriers:  Unable to independently monitor therapeutic efficacy Unable to maintain control of Type 2 diabetes Unable to self administer medications as prescribed   Multiple co morbidities Complex medication regimen Difficulty swallowing tablets  Pharmacist Clinical Goal(s):  Over the next 60 days, patient will achieve adherence to monitoring guidelines and medication adherence to achieve therapeutic efficacy maintain control of type 2 diabetes as evidenced by A1c < 8.0% (given patient's age and comorbid conditions)  Review medication list and need for medication adjustments based on renal function  through collaboration with PharmD and provider.   Interventions: 1:1 collaboration with Carollee Herter, Alferd Apa, DO regarding development and update of comprehensive plan of care as evidenced by provider attestation and co-signature Inter-disciplinary care team collaboration (see longitudinal plan of care) Comprehensive medication review performed; medication list updated in electronic medical record  Diabetes: Controlled; Last A1c was 6.7% (give patient's age and co-morbid conditions would recommended A1c < 8.0%) current treatment: None currently Metformin stopped in December 2022 due to diarrhea Current glucose readings: checked 1 or 2 times per week by home health nurse.  Blood glucose values not available.  Noted to have cardiomyopathy. Last EF was 55 to 60% (02/11/2019) Interventions:  Educated about goal A1c Reviewed home blood glucose goals  Fasting blood glucose goal (before meals) = 80 to 130 Blood glucose goal after a meal = less than 180  Continue to check  blood glucose 1 or 2 times per week and record If pharmacotherapy is started could consider SGLT2 due to decreased renal function though at lower eGFR SGLTs have less blood glucose lowering effect.  [facility] and followed by cardiology Farxiga: 10 mg orally once daily, but improvements in glycemic control likely to be ineffective if eGFR is less than 45 mL/min/1.73 m.  Jardiance: Estimated GFR 20 mL/min/1.73 m(2) or greater: 10 mg orally once daily may have a beneficial effect on cardiovascular health and slow the rate of kidney function decline; Glycemic Control in Patients Without Established Cardiovascular Disease or Cardiovascular Risk Factors. Estimated GFR less than 30 mL/min/1.73 m: Use not recommended  Hypertension: Controlled; blood pressure goal < 140/90 Current treatment: Carvedilol 3.198m twice a day Current home readings: daughter did not have specific readings but reported that home health nurse checks 1 to 2 times per week and "blood pressure has been good" Denies hypotensive/hypertensive symptoms Interventions:  Discussed blood pressure goal with family Continue current therapy for blood pressure   Hyperlipidemia / CAD: Controlled; LDL goal <70 Current treatment: Atorvastatin 835mdaily  Aspirin 8131maily  Ranolazine 500m30mce a day Patient had taken ezetimibe 10mg43mly in past but per family they were told when he left SNF that ezetimibe was not needed.  Last LDL was 33 (when he was taking both atorvastatin and ezetimibe)  Daughter report difficulty swallowing large tablets like atorvastatin 80mg 55mranolazine Interventions:  Discussed benefits of atorvastatin / statins. Suggested could change to smaller atorvastatin 40mg t36mt or rosuvastatin 20mg ta73m. Plan to discuss with cardiologist.  Since also having difficulty swallowing ranolazine will also discuss with cardiologist if trial off ranolazine is possible. Education provided  about LDL goal  Osteoporosis: Current treatment: Vitamin D 50,000 units weekly Last DEXA:  DualFemur Total Mean 09/05/2020 -  1.9  DualFemur Total Mean 07/01/2018  -1.7  DualFemur Total Mean 06/11/2016 -1.7  Left Forearm Radius 33% 09/05/2020  -3.3  Left Forearm Radius 33% 07/01/2018 -2.9  Left Forearm Radius 33% 06/11/2016  -3.0  Last Scr = 1.98 and estimate GFR was 31; Estimated creatinine Clearance = 20 mL/min Stopped calcium supplement in 2023 due to difficulty swallowing even petite sized calcium supplement.  Interventions:  Continue off alendronate since CrCl is less than 52m/min Continue vitamin D weekly   Chronic Kidney Disease - 3B Lab Results  Component Value Date   CREATININE 2.14 (H) 12/12/2021   BUN 40 (H) 12/12/2021   NA 142 12/12/2021   K 4.1 12/12/2021   CL 109 12/12/2021   CO2 22 12/12/2021  Interventions:  Continue off alendronate since CrCl is less than 324mmin Follow up with Ca[facility] as planned   Health Maintenance:  Reviewed vaccination history and discussed benefits of annual flu vaccine Daughter mentions patient eats very little. He is lactose intolerant and avoids Ensure type supplements. Tried fruit punch Ensure but did not like it.  Daughter to [facility], then we might see if an independent pharmcy might agree to give vaccine to patient in car. Very difficulty for family to get patient in and out of car and to use wheelchair.  Suggested a few nutritional supplements that are plant based / lactose free - Ensure Plant based replacement and Owyn (both are available in some grocery stores and thru [employer] Medication management Pharmacist Clinical Goal(s): Over the next 90 days, patient will work with PharmD and providers to maintain optimal medication adherence [facility] does not have Medicare Part D coverage. Cost now with penalties would  be high. He is on generics and per daughter cost is affordable.  Confirmed patient is taking levothyroxine 8825m- Last TSH was WNL.   Interventions Comprehensive medication review performed. Reviewed refill history and assessed adherence Continue current medication management strategy  Patient Goals/Self-Care Activities Over the next 60 days, patient will:  take medications as prescribed,  check glucose 1 to 2 times per week, document, and provide at future appointments,  collaborate with provider on medication access solutions I will check with cardiologist about atorvastatin and ranolazine since you are having difficulty swallowing these larger tablets.  Continue to work with home health with occupation and physical therapy Ask if [facility] be able to provide flu vaccine in home. If not call me and I will check other options.  Follow up with nephrologist / [facility] as planned Separate levothyroxine (take each morning) and calcium supplement (take in evening). Take levothyroxine 18m76maily  Try either Ensure Plant based replacement and Owyn (both are available in some grocery stores and thru [employer]ollow Up Plan: Telephone follow up appointment with care management team member scheduled for:  2 to 3 months.           Medication Assistance: None required.  Patient affirms current coverage meets needs.  Patient's [facility] pharmacy is:  [facility] [address]: [phone]: [address] [address]: [phone]: [phone]/pharmacy #[id number] [place] [place]: [phone]  Fax: [facility] [address], [place]  [address] Phone: [phone] Fax: [phone]  Uses pill box? Yes Daughter assist with medication management  Follow Up:  Patient agrees to Care Plan and Follow-up.  Plan: Telephone follow up appointment with care management team member scheduled for:  8 weeks  [name], PharmD Clinical Pharmacist [facility]

## 2022-03-29 NOTE — Telephone Encounter (Signed)
[facility] calling to see if she can get Verbal orders for Urinalysis to take to [facility]. Daughter called her this morning and said that patient is having pain with urination and it's cloudy with white clumpy sedimentation in the catheter bag. Please call at [phone]. She asked that the request be urgent due to the pain.

## 2022-03-30 DIAGNOSIS — N390 Urinary tract infection, site not specified: Secondary | ICD-10-CM | POA: Diagnosis not present

## 2022-04-02 ENCOUNTER — Ambulatory Visit: Payer: Medicare Other | Admitting: Podiatry

## 2022-04-05 ENCOUNTER — Telehealth: Payer: Self-pay | Admitting: Pharmacist

## 2022-04-05 ENCOUNTER — Telehealth: Payer: Self-pay | Admitting: Cardiology

## 2022-04-05 ENCOUNTER — Inpatient Hospital Stay: Payer: Medicare Other | Admitting: Hematology

## 2022-04-05 NOTE — Telephone Encounter (Signed)
Pt c/o medication issue:  1. Name of Medication: atorvastatin (LIPITOR) 80 MG tablet  ranolazine (RANEXA) 500 MG 12 hr tablet  2. How are you currently taking this medication (dosage and times per day)?    3. Are you having a reaction (difficulty breathing--STAT)? no  4. What is your medication issue? Calling to see what the dr thoughts are to adjust some of the patient medication. Patient having difficult swallowing the medication. Please advise

## 2022-04-05 NOTE — Telephone Encounter (Signed)
Recommendations reviewed with Malachy Mood per DPR as per Dr. Julien Nordmann note.  cheryl verbalized understanding and had no additional questions. Routed to PCP.

## 2022-04-05 NOTE — Telephone Encounter (Signed)
Called cardiologist office regarding patient's daughter's report that he was having difficulty swallowing large pills - atorvastatin '80mg'$  and ranozoline '500mg'$ .  Left message for cardiology office to get Dr Sumner Boast recommendation Could change to rosuvastatin 20 ot '40mg'$  which are smaller tablets.  Not sure there is a substitute for the ranolazine.

## 2022-04-08 ENCOUNTER — Telehealth: Payer: Self-pay | Admitting: Family Medicine

## 2022-04-09 ENCOUNTER — Inpatient Hospital Stay: Payer: Medicare Other | Attending: Hematology | Admitting: Hematology

## 2022-04-09 ENCOUNTER — Telehealth: Payer: Self-pay | Admitting: Family Medicine

## 2022-04-09 ENCOUNTER — Ambulatory Visit: Payer: Medicare Other | Admitting: Podiatry

## 2022-04-09 ENCOUNTER — Other Ambulatory Visit: Payer: Self-pay

## 2022-04-09 DIAGNOSIS — D472 Monoclonal gammopathy: Secondary | ICD-10-CM | POA: Diagnosis not present

## 2022-04-09 DIAGNOSIS — R197 Diarrhea, unspecified: Secondary | ICD-10-CM | POA: Insufficient documentation

## 2022-04-09 DIAGNOSIS — C9000 Multiple myeloma not having achieved remission: Secondary | ICD-10-CM | POA: Insufficient documentation

## 2022-04-09 MED ORDER — NITROFURANTOIN MONOHYD MACRO 100 MG PO CAPS
100.0000 mg | ORAL_CAPSULE | Freq: Two times a day (BID) | ORAL | 0 refills | Status: DC
Start: 2022-04-09 — End: 2022-04-09

## 2022-04-09 MED ORDER — NITROFURANTOIN MONOHYD MACRO 100 MG PO CAPS: 100.0000 mg | ORAL_CAPSULE | Freq: Two times a day (BID) | ORAL | 0 refills | Status: DC

## 2022-04-09 NOTE — Telephone Encounter (Signed)
Spoke with DOD. Advised to send in Rx. Spoke with pt's daughter and advised medication was being sent in.

## 2022-04-09 NOTE — Telephone Encounter (Signed)
Returned call and LVM. Pt was advised that duplicate medication was denied. Med resent to make sure pharmacy got received.

## 2022-04-09 NOTE — Progress Notes (Signed)
HEMATOLOGY/ONCOLOGY PHONE VISIT NOTE  Date of Service: 04/09/2022  Patient Care Team: Carollee Herter, Alferd Apa, DO as PCP - General Calvert Cantor, MD as Consulting Physician (Ophthalmology) Carolan Clines, MD (Inactive) as Consulting Physician (Urology) Cameron Sprang, MD as Consulting Physician (Neurology) [name], Abie (Pharmacist) Revankar, Reita Cliche, MD as Consulting Physician (Cardiology)  CHIEF COMPLAINTS/PURPOSE OF CONSULTATION:  Evaluation and consultation of abnormal SPEP.  HISTORY OF PRESENTING ILLNESS:  Evan Wright is a wonderful 88 y.o. male who has been referred to [place] by Dr Carollee Herter, Alferd Apa, DO for evaluation and consultation of abnormal SPEP. He presents today accompanied by his daughter. He reports He is doing well.  His daughter notes that his bladder is not emptying properly. We discussed that this could just be age-related urinary incontinence.  His daughter notes that his appetite has decreased roughly 20 lbs since Fall 2022. and he has been losing weight.  He reports that he has had 4 UTIs since placement of catheter.  He notes difficulty with bowel movements such as diarrhea. We discussed limiting medications that can cause diarrhea. We further discussed potential sacral, peripheral and central nerve-related issues and potential treatments based on evaluative decisions based on how far pt would like to pursue this at this time. We discussed potentially starting Calcium Polycarbophil or another kind of stool hardener. We discussed stopping stool softener, magnesium oxide, and metformin was previously stopped by PCP.  We discussed getting labs today which he was agreeable to.  He reports some issues with his back. He notes he uses a cane. He reports that when walking with cane one day his knee gave out. We discussed that he may have age-related muscular atrophy.   We discussed, based on his chart and available labs, that he may elect to take a  conservative approach which he was agreeable to.  No new focal bone pains No other new or acute focal symptoms.  We discussed labs done 09/12/2021. CBC stable. CMP stable with creatinine of 1.78 consistent with chronic kidney disease.  INTERVAL HISTORY:  Evan Wright is a 88 y.o. male who was contacted via phone to discuss recent labs for evaluation of previous abnormal SPEP. He reports He is doing well with no new symptoms or concerns.  Myeloma labs from 01/18/2022 are stable. We discussed that his labs show no signs of disease progression at this time.   He is advised to continue to f/u with pcp to simplify his care.  No new focal bone pains No other new or acute focal symptoms.  We discussed labs done 03/14/2022 in detail.  MEDICAL HISTORY:  Past Medical History:  Diagnosis Date   Abnormal EKG 04/02/2017   Acute kidney injury superimposed on CKD ([place]) 07/26/2021   Adenomatous colon polyp    Angina pectoris ([place]) 04/2017   Ascending aortic aneurysm ([place]) 03/13/2021   CAD (coronary artery disease)    a. Cath 04/29/17-> high grade pLAD with R to L collaterals, high grade 1st dig  2. Cath 05/01/17 s/p ostial to mLAD with DES x 2.    CAD (coronary artery disease), native coronary artery 04/29/2017   Cardiac murmur    Coronary artery disease    De Quervain's tenosynovitis 05/18/2012   Diarrhea 06/26/2021   Diverticulosis    DIZZINESS 10/15/2007   Qualifier: Diagnosis of  By: [name]     Essential hypertension 09/25/2017   Family history of malignant neoplasm of gastrointestinal tract 05/14/2011   Brother  with colon cancer    Fecal impaction ([place]) 07/26/2021   Frequent falls 07/26/2021   GASTROENTERITIS 01/20/2008   Qualifier: Diagnosis of  By: [name], [name]     GERD (gastroesophageal reflux disease)    HIATAL HERNIA WITH REFLUX 02/11/2008   Qualifier: Diagnosis of  By: [name]     History of hiatal hernia    Hyperglycemia 09/25/2017   Hyperlipidemia  associated with type 2 diabetes mellitus ([place])    Hypertension associated with diabetes ([place]) 09/25/2017   Hypokalemia 07/26/2021   Hypothyroidism 09/25/2017   Idiopathic peripheral neuropathy 10/11/2016   Intermittent claudication ([place]) 06/02/2020   Ischemic cardiomyopathy 04/10/2017   Loss of height 06/11/2016   Mixed dyslipidemia 04/10/2017   Moderate aortic regurgitation 03/13/2021   NEVI, MULTIPLE 07/12/2010   Qualifier: Diagnosis of  By: [name]     Nonsustained ventricular tachycardia ([place]) 04/24/2017   Osteoarthritis    OSTEOARTHRITIS 07/12/2010   Qualifier: Diagnosis of  By: [name]     Osteopenia 09/25/2017   Pedal edema 03/13/2021   Personal history of colonic polyps 11/17/2007   Qualifier: Diagnosis of  By: [name], [name]    PNA (pneumonia) 07/27/2021   Preventative health care 09/23/2018   Prostate cancer ([employer])    PROSTATE CANCER, HX OF 10/15/2007   Qualifier: Diagnosis of  By: [name]     Proximal leg weakness 10/11/2016   Right arm weakness 09/28/2018   Right lower lobe pneumonia 07/26/2021   SDH (subdural hematoma) ([place]) 09/28/2018   Seizure-like activity ([place]) 09/28/2018   Slow transit constipation 08/10/2019   Traumatic subdural hemorrhage with loss of consciousness of unspecified duration, initial encounter ([place]) 06/26/2021   Type 2 diabetes mellitus ([place]) 09/23/2018   Uncontrolled diabetes mellitus with hyperglycemia ([place]) 09/23/2018   Unspecified vitamin D deficiency 02/11/2008   Qualifier: Diagnosis of  By: [name]     Urinary retention 07/26/2021   Weakness of both lower extremities 06/11/2016    SURGICAL HISTORY: Past Surgical History:  Procedure Laterality Date   APPENDECTOMY  1948   COLONOSCOPY     CORONARY STENT INTERVENTION N/A 05/01/2017   Procedure: CORONARY STENT INTERVENTION;  Surgeon: [name], [name], MD;  Location: [facility];  Service: Cardiovascular;  Laterality: N/A;   EYE SURGERY  2012   march and  April  --Dr [name]   LEFT HEART CATH AND CORONARY ANGIOGRAPHY N/A 04/29/2017   Procedure: LEFT HEART CATH AND CORONARY ANGIOGRAPHY;  Surgeon: [name], MD;  Location: [facility];  Service: Cardiovascular;  Laterality: N/A;   PROSTATECTOMY     TONSILLECTOMY      SOCIAL HISTORY: Social History   Socioeconomic History   Marital status: Married    Spouse name: Not on file   Number of children: 2   Years of education: Not on file   Highest education level: Not on file  Occupational History   Occupation: retired    Fish farm manager: RETIRED  Tobacco Use   Smoking status: Never   Smokeless tobacco: Never  Vaping Use   Vaping Use: Never used  Substance and Sexual Activity   Alcohol use: Never   Drug use: No   Sexual activity: Not on file  Other Topics Concern   Not on file  Social History Narrative   Right handed   Lives with family   Social Determinants of Health   Financial Resource Strain: Low Risk  (02/13/2022)   Overall Financial  Resource Strain (CARDIA)    Difficulty of Paying Living Expenses: Not very hard  Food Insecurity: No Food Insecurity (03/14/2022)   Hunger Vital Sign    Worried About Running Out of Food in the Last Year: Never true    Ran Out of Food in the Last Year: Never true  Transportation Needs: No Transportation Needs (03/14/2022)   PRAPARE - Hydrologist (Medical): No    Lack of Transportation (Non-Medical): No  Physical Activity: Not on file  Stress: Not on file  Social Connections: Not on file  Intimate Partner Violence: Not on file    FAMILY HISTORY: Family History  Problem Relation Age of Onset   Colon cancer Brother    Lung cancer Father    Prostate cancer Father    Colitis Mother     ALLERGIES:  is allergic to lactose intolerance (gi) and penicillins.  MEDICATIONS:  Current Outpatient Medications  Medication Sig Dispense Refill   acetaminophen (TYLENOL) 500 MG tablet Take 1,000 mg by mouth every 6  (six) hours as needed for mild pain.     aspirin EC 81 MG tablet Take 1 tablet (81 mg total) by mouth daily at 12 noon. (Patient taking differently: Take 81 mg by mouth every morning.)     atorvastatin (LIPITOR) 80 MG tablet TAKE ONE TABLET ('80MG'$  TOTAL) BY MOUTH DAILY 90 tablet 2   carboxymethylcellulose (REFRESH PLUS) 0.5 % SOLN 1 drop daily as needed (dry eyes).     carvedilol (COREG) 3.125 MG tablet TAKE ONE TABLET (3.'125MG'$  TOTAL) BY MOUTHTWICE DAILY WITH A MEAL 180 tablet 1   lansoprazole (PREVACID) 30 MG capsule TAKE ONE CAPSULE ('30MG'$  TOTAL) BY MOUTH TWICE DAILY BEFORE A MEAL 90 capsule 3   levothyroxine (SYNTHROID) 88 MCG tablet Take 1 tablet (88 mcg total) by mouth daily. 30 tablet 2   Multiple Vitamins-Minerals (PRESERVISION AREDS 2) CAPS Take 1 capsule by mouth in the morning.     nitrofurantoin, macrocrystal-monohydrate, (MACROBID) 100 MG capsule Take 1 capsule (100 mg total) by mouth 2 (two) times daily. 20 capsule 0   nitroGLYCERIN (NITROSTAT) 0.4 MG SL tablet Place 0.4 mg under the tongue every 5 (five) minutes as needed for chest pain.     ranolazine (RANEXA) 500 MG 12 hr tablet TAKE ONE TABLET (500 MG TOTAL) BY MOUTH ONCE DAILY 90 tablet 3   Vitamin D, Ergocalciferol, (DRISDOL) 1.25 MG (50000 UNIT) CAPS capsule TAKE ONE CAPSULE (50000 UNITS TOTAL) BY MOUTH EVERY 7 DAYS 12 capsule 1   No current facility-administered medications for this visit.    REVIEW OF SYSTEMS:    10 Point review of Systems was done is negative except as noted above.  PHYSICAL EXAMINATION:  Telemedicine appointment  LABORATORY DATA:  I have reviewed the data as listed  .    Latest Ref Rng & Units 12/12/2021    9:59 AM 11/15/2021   12:43 PM 09/12/2021   12:00 AM  CBC  WBC 4.0 - 10.5 K/uL 6.5  7.6  5.8      Hemoglobin 13.0 - 17.0 g/dL 12.9  12.5  11.7      Hematocrit 39.0 - 52.0 % 39.7  37.6  36      Platelets 150.0 - 400.0 K/uL 213.0  228  189         This result is from an external source.     .    Latest Ref Rng & Units 12/12/2021    9:59 AM 11/15/2021  12:43 PM 09/12/2021   12:00 AM  CMP  Glucose 70 - 99 mg/dL 156  173    BUN 6 - 23 mg/dL 40  44  25      Creatinine 0.40 - 1.50 mg/dL 2.14  2.21  1.8      Sodium 135 - 145 mEq/L 142  139  145      Potassium 3.5 - 5.1 mEq/L 4.1  4.1  3.8      Chloride 96 - 112 mEq/L 109  110  108      CO2 19 - 32 mEq/L '22  21  17      '$ Calcium 8.4 - 10.5 mg/dL 10.3  10.0  9.1      Total Protein 6.0 - 8.3 g/dL 6.9  7.6    Total Bilirubin 0.2 - 1.2 mg/dL 0.6  0.6    Alkaline Phos 39 - 117 U/L 79  81    AST 0 - 37 U/L 10  13    ALT 0 - 53 U/L 10  12       This result is from an external source.     RADIOGRAPHIC STUDIES: I have personally reviewed the radiological images as listed and agreed with the findings in the report. No results found.  ASSESSMENT & PLAN:   88 y.o. very pleasant male with  1. Monoclonal paraproteinemia   -Continue f/u with nephrology for chronic kidney disease.  2. 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 CBC stable. CMP stable with creatinine of 1.98 consistent with chronic kidney disease. -Myeloma labs from 01/18/2022 show no change in M protein spike at 0.6. -Patient's chart reviewed in detail -We discussed limiting medications that can cause diarrhea. -He has elected to take a conservative approach. -We discussed that he may have age-related muscular atrophy.  -f/u with nephrology  for continued management and evaluation of CKD -We discussed that his labs show no signs of disease progression at this time.  -He is advised to continue to f/u with pcp to simplify his care.  2)  Patient Active Problem List   Diagnosis Date Noted   Balance problem 11/29/2021   PNA (pneumonia) 07/27/2021   Fecal impaction ([place]) 07/26/2021   Acute kidney injury superimposed on CKD ([place])  07/26/2021   Urinary retention 07/26/2021   Frequent falls 07/26/2021   Hypokalemia 07/26/2021   Right lower lobe pneumonia 07/26/2021   Traumatic subdural hemorrhage with loss of consciousness of unspecified duration, initial encounter ([place]) 06/26/2021   Diarrhea 06/26/2021   Moderate aortic regurgitation 03/13/2021   Ascending aortic aneurysm ([place]) 03/13/2021   Pedal edema 03/13/2021   Adenomatous colon polyp    CAD (coronary artery disease)    Cardiac murmur    Coronary artery disease    Diverticulosis    GERD (gastroesophageal reflux disease)    History of hiatal hernia    Hyperlipidemia associated with type 2 diabetes mellitus (HCC)    Prostate cancer ([place])    Osteoarthritis    Intermittent claudication ([place]) 06/02/2020   Slow transit constipation 08/10/2019   SDH (subdural hematoma) ([place]) 09/28/2018   Seizure-like activity ([place]) 09/28/2018   Right arm weakness 09/28/2018   Preventative health care 09/23/2018   Type 2 diabetes mellitus ([place]) 09/23/2018   Uncontrolled diabetes mellitus with hyperglycemia ([place]) 09/23/2018   Osteopenia 09/25/2017   Hypertension associated with diabetes ([place]) 09/25/2017   Hyperglycemia 09/25/2017  Hypothyroidism 09/25/2017   Primary hypertension 09/25/2017   CAD (coronary artery disease), native coronary artery 04/29/2017   Nonsustained ventricular tachycardia ([place]) 04/24/2017   Angina pectoris ([place]) 04/2017   Ischemic cardiomyopathy 04/10/2017   Mixed dyslipidemia 04/10/2017   Abnormal EKG 04/02/2017   Idiopathic peripheral neuropathy 10/11/2016   Proximal leg weakness 10/11/2016   Weakness of both lower extremities 06/11/2016   Loss of height 06/11/2016   De Quervain's tenosynovitis 05/18/2012   Family history of malignant neoplasm of gastrointestinal tract 05/14/2011   NEVI, MULTIPLE 07/12/2010   OSTEOARTHRITIS 07/12/2010   UNSPECIFIED VITAMIN D DEFICIENCY 02/11/2008   HIATAL HERNIA WITH REFLUX 02/11/2008   GASTROENTERITIS  01/20/2008   PERSONAL HISTORY OF COLONIC POLYPS 11/17/2007   DIZZINESS 10/15/2007   PROSTATE CANCER, HX OF 10/15/2007    Follow up: RTC with PCP  . No orders of the defined types were placed in this encounter.  .The total time spent in the appointment was 15 minutes* .  All of the patient's questions were answered with apparent satisfaction. The patient knows to call the clinic with any problems, questions or concerns.   Sullivan Lone MD MS AAHIVMS [facility] Hematology/Oncology Physician [facility]  .*Total Encounter Time as defined by the Centers for Medicare and Medicaid Services includes, in addition to the face-to-face time of a patient visit (documented in the note above) non-face-to-face time: obtaining and reviewing outside history, ordering and reviewing medications, tests or procedures, care coordination (communications with other health care professionals or caregivers) and documentation in the medical record.  I, Melene Muller, am acting as scribe for Dr. Sullivan Lone, MD.  .I have reviewed the above documentation for accuracy and completeness, and I agree with the above.Brunetta Genera MD

## 2022-04-09 NOTE — Telephone Encounter (Signed)
Malachy Mood (daughter DPR OK) called stating that [facility] had refaxed the order over for antibiotics in reference to lab orders that were done and showed a UTI. Verified that order is in [address] drive and awaiting provider review. She stated that she would like to have this looked into ASAP as he has been dealing with this UTI for some time. She would like a call back once the antibiotic has been sent off.

## 2022-04-09 NOTE — Telephone Encounter (Signed)
Evan Wright (daughter DPR OK) called to ask why this medication was denied. She would like a call back when info is available on this matter.

## 2022-04-09 NOTE — Addendum Note (Signed)
Addended by: Sanda Linger on: 04/09/2022 10:48 AM   Modules accepted: Orders

## 2022-04-19 ENCOUNTER — Other Ambulatory Visit: Payer: Self-pay | Admitting: Family Medicine

## 2022-04-20 DIAGNOSIS — I2510 Atherosclerotic heart disease of native coronary artery without angina pectoris: Secondary | ICD-10-CM

## 2022-04-20 DIAGNOSIS — E1159 Type 2 diabetes mellitus with other circulatory complications: Secondary | ICD-10-CM

## 2022-04-20 DIAGNOSIS — I1310 Hypertensive heart and chronic kidney disease without heart failure, with stage 1 through stage 4 chronic kidney disease, or unspecified chronic kidney disease: Secondary | ICD-10-CM | POA: Diagnosis not present

## 2022-04-20 DIAGNOSIS — E785 Hyperlipidemia, unspecified: Secondary | ICD-10-CM | POA: Diagnosis not present

## 2022-04-20 DIAGNOSIS — M810 Age-related osteoporosis without current pathological fracture: Secondary | ICD-10-CM

## 2022-04-20 DIAGNOSIS — N1832 Chronic kidney disease, stage 3b: Secondary | ICD-10-CM

## 2022-04-23 ENCOUNTER — Telehealth: Payer: Self-pay | Admitting: Family Medicine

## 2022-04-23 DIAGNOSIS — C61 Malignant neoplasm of prostate: Secondary | ICD-10-CM

## 2022-04-23 DIAGNOSIS — R627 Adult failure to thrive: Secondary | ICD-10-CM

## 2022-04-23 NOTE — Telephone Encounter (Signed)
Evan Wright) called stating that due to pt's declining health, they would like to request a Hospice or palliative care consult

## 2022-04-24 ENCOUNTER — Telehealth: Payer: Self-pay | Admitting: Family Medicine

## 2022-04-24 ENCOUNTER — Telehealth: Payer: Self-pay | Admitting: Cardiology

## 2022-04-24 MED ORDER — ATORVASTATIN CALCIUM 80 MG PO TABS: ORAL_TABLET | ORAL | 1 refills | Status: DC

## 2022-04-24 NOTE — Telephone Encounter (Signed)
Evan Wright from [facility] hh states pt is not feeling well and missed his visit this week, but their nurse has been helping him.

## 2022-04-24 NOTE — Telephone Encounter (Signed)
Noted  

## 2022-04-24 NOTE — Telephone Encounter (Signed)
*  STAT* If patient is at the pharmacy, call can be transferred to refill team.  1. Which medications need to be refilled? (please list name of each medication and dose if known)   atorvastatin (LIPITOR) 80 MG tablet  2. Which pharmacy/location (including street and city if local pharmacy) is medication to be sent to?  [place], [place]  3. Do they need a 30 day or 90 day supply? 90 day  Daughter stated patient is almost out of this medication.  Daughter noted patient is too weak to leave his house at this time.

## 2022-04-24 NOTE — Telephone Encounter (Signed)
Okay to place order? Dx code?

## 2022-04-24 NOTE — Telephone Encounter (Signed)
Refill sent to pharmacy.   

## 2022-04-25 NOTE — Telephone Encounter (Signed)
HH called again regarding referral. I advised that I needed a Dx code and [place] provided that patient isn't doing well and not eating/drinking much and had prostate cancer. Hospice referral placed.

## 2022-05-06 ENCOUNTER — Other Ambulatory Visit: Payer: Self-pay | Admitting: Family Medicine

## 2022-05-14 ENCOUNTER — Telehealth: Payer: Self-pay | Admitting: Family Medicine

## 2022-05-14 NOTE — Telephone Encounter (Signed)
[facility] h[facility] states pt has a stage 1 pressure ulcer on his sacral area and they are needing treatment orders. Stated she will give more information about the orders when she speaks with a nurse. Her number is listed in contacts.

## 2022-05-14 NOTE — Telephone Encounter (Signed)
Returned call. Evan Wright states pt has stage 1 pressure wound on the sacral area. She states very close to being a stage 2. HH requests sacral bone cleaning and dressing twice weekly. Verbal given. FYI

## 2022-06-04 ENCOUNTER — Ambulatory Visit: Payer: Medicare Other | Admitting: Podiatry

## 2022-06-24 ENCOUNTER — Other Ambulatory Visit: Payer: Self-pay | Admitting: Family Medicine

## 2022-07-16 ENCOUNTER — Telehealth: Payer: Self-pay | Admitting: Cardiology

## 2022-07-16 NOTE — Telephone Encounter (Signed)
Daughter states she cannot get pt in and out of car anymore and would like to sch a mychart video visit instead for pt. Please advise if this is okay

## 2022-07-17 ENCOUNTER — Other Ambulatory Visit: Payer: Self-pay

## 2022-07-17 DIAGNOSIS — I10 Essential (primary) hypertension: Secondary | ICD-10-CM | POA: Insufficient documentation

## 2022-07-24 ENCOUNTER — Ambulatory Visit: Payer: Medicare Other | Attending: Cardiology | Admitting: Cardiology

## 2022-07-24 ENCOUNTER — Encounter: Payer: Self-pay | Admitting: Cardiology

## 2022-07-24 VITALS — BP 145/79 | HR 67 | Temp 98.0°F | Resp 20 | Ht 72.0 in

## 2022-07-24 DIAGNOSIS — I10 Essential (primary) hypertension: Secondary | ICD-10-CM

## 2022-07-24 DIAGNOSIS — I2510 Atherosclerotic heart disease of native coronary artery without angina pectoris: Secondary | ICD-10-CM

## 2022-07-24 DIAGNOSIS — I7121 Aneurysm of the ascending aorta, without rupture: Secondary | ICD-10-CM

## 2022-07-24 NOTE — Telephone Encounter (Signed)
  Patient Consent for Virtual Visit        Evan Wright has provided verbal consent on 07/24/2022 for a virtual visit (video or telephone).   CONSENT FOR VIRTUAL VISIT FOR:  Evan Wright  By participating in this virtual visit I agree to the following:  I hereby voluntarily request, consent and authorize [facility] and its employed or contracted physicians, physician assistants, nurse practitioners or other licensed health care professionals (the Practitioner), to provide me with telemedicine health care services (the "Services") as deemed necessary by the treating Practitioner. I acknowledge and consent to receive the Services by the Practitioner via telemedicine. I understand that the telemedicine visit will involve communicating with the Practitioner through live audiovisual communication technology and the disclosure of certain medical information by electronic transmission. I acknowledge that I have been given the opportunity to request an in-person assessment or other available alternative prior to the telemedicine visit and am voluntarily participating in the telemedicine visit.  I understand that I have the right to withhold or withdraw my consent to the use of telemedicine in the course of my care at any time, without affecting my right to future care or treatment, and that the Practitioner or I may terminate the telemedicine visit at any time. I understand that I have the right to inspect all information obtained and/or recorded in the course of the telemedicine visit and may receive copies of available information for a reasonable fee.  I understand that some of the potential risks of receiving the Services via telemedicine include:  Delay or interruption in medical evaluation due to technological equipment failure or disruption; Information transmitted may not be sufficient (e.g. poor resolution of images) to allow for appropriate medical decision making by the Practitioner;  and/or  In rare instances, security protocols could fail, causing a breach of personal health information.  Furthermore, I acknowledge that it is my responsibility to provide information about my medical history, conditions and care that is complete and accurate to the best of my ability. I acknowledge that Practitioner's advice, recommendations, and/or decision may be based on factors not within their control, such as incomplete or inaccurate data provided by me or distortions of diagnostic images or specimens that may result from electronic transmissions. I understand that the practice of medicine is not an exact science and that Practitioner makes no warranties or guarantees regarding treatment outcomes. I acknowledge that a copy of this consent can be made available to me via my patient portal ([facility]), or I can request a printed copy by calling the office of [place].    I understand that my insurance will be billed for this visit.   I have read or had this consent read to me. I understand the contents of this consent, which adequately explains the benefits and risks of the Services being provided via telemedicine.  I have been provided ample opportunity to ask questions regarding this consent and the Services and have had my questions answered to my satisfaction. I give my informed consent for the services to be provided through the use of telemedicine in my medical care

## 2022-07-24 NOTE — Progress Notes (Signed)
Virtual Visit via Video Note   Because of Evan Wright's co-morbid illnesses, he is at least at moderate risk for complications without adequate follow up.  This format is felt to be most appropriate for this patient at this time.  All issues noted in this document were discussed and addressed.  A limited physical exam was performed with this format.  Please refer to Evan Wright patient's chart for his consent to telehealth for Evan Wright,Evan Wright.       Date:  07/24/2022   ID:  Evan Wright, DOB [birth year], MRN [id number] Evan Wright patient was identified using 2 identifiers.  Patient Location: Home Provider Location: Office/Clinic   PCP:  Carollee Herter, [place] Providers Cardiologist:  None     Evaluation Performed:  Follow-Up Visit  Chief Complaint: Coronary artery disease for follow-up  History of Present Illness:    Evan Wright is a 89 y.o. male with past medical history of coronary artery disease, ascending aortic aneurysm, mixed dyslipidemia.  He takes care of activities of daily living.  Overall he ambulates age appropriately at home.  This was a video visit because Evan Wright patient is having difficulty getting out of his home and Evan Wright daughter and Evan Wright family requested it.  At Evan Wright time of my evaluation, Evan Wright patient is alert awake oriented and in no distress.   Past Medical History:  Diagnosis Date   Abnormal EKG 04/02/2017   Acute kidney injury superimposed on CKD ([place]) 07/26/2021   Adenomatous colon polyp    Angina pectoris ([place]) 04/2017   Ascending aortic aneurysm ([place]) 03/13/2021   Balance problem 11/29/2021   CAD (coronary artery disease)    a. Cath 04/29/17-> high grade pLAD with R to L collaterals, high grade 1st dig  2. Cath 05/01/17 s/p ostial to mLAD with DES x 2.    CAD (coronary artery disease), native coronary artery 04/29/2017   Cardiac murmur    Coronary artery disease    De Quervain's tenosynovitis 05/18/2012   Diarrhea 06/26/2021    Diverticulosis    DIZZINESS 10/15/2007   Qualifier: Diagnosis of  By: [name], [place] hypertension 09/25/2017   Family history of malignant neoplasm of gastrointestinal tract 05/14/2011   Brother with colon cancer    Fecal impaction ([place]) 07/26/2021   Frequent falls 07/26/2021   GASTROENTERITIS 01/20/2008   Qualifier: Diagnosis of  By: [name], [name]     GERD (gastroesophageal reflux disease)    HIATAL HERNIA WITH REFLUX 02/11/2008   Qualifier: Diagnosis of  By: [name]     History of hiatal hernia    Hyperglycemia 09/25/2017   Hyperlipidemia associated with type 2 diabetes mellitus ([place])    Hypertension associated with diabetes ([place]) 09/25/2017   Hypokalemia 07/26/2021   Hypothyroidism 09/25/2017   Idiopathic peripheral neuropathy 10/11/2016   Intermittent claudication ([place]) 06/02/2020   Ischemic cardiomyopathy 04/10/2017   Loss of height 06/11/2016   Mixed dyslipidemia 04/10/2017   Moderate aortic regurgitation 03/13/2021   NEVI, MULTIPLE 07/12/2010   Qualifier: Diagnosis of  By: [name]     Nonsustained ventricular tachycardia ([place]) 04/24/2017   Osteoarthritis    OSTEOARTHRITIS 07/12/2010   Qualifier: Diagnosis of  By: [name]     Osteopenia 09/25/2017   Pedal edema 03/13/2021   Personal history of colonic polyps 11/17/2007   Qualifier: Diagnosis of  By: [name], [name]    PNA (pneumonia) 07/27/2021  Preventative health care 09/23/2018   Primary hypertension 09/25/2017   Prostate cancer ([place])    PROSTATE CANCER, HX OF 10/15/2007   Qualifier: Diagnosis of  By: Etter Sjogren DO, Kendrick Fries     Proximal leg weakness 10/11/2016   Right arm weakness 09/28/2018   Right lower lobe pneumonia 07/26/2021   SDH (subdural hematoma) ([place]) 09/28/2018   Seizure-like activity ([place]) 09/28/2018   Slow transit constipation 08/10/2019   Traumatic subdural hemorrhage with loss of consciousness of unspecified duration, initial encounter ([place])  06/26/2021   Type 2 diabetes mellitus ([place]) 09/23/2018   Uncontrolled diabetes mellitus with hyperglycemia ([place]) 09/23/2018   Unspecified vitamin D deficiency 02/11/2008   Qualifier: Diagnosis of  By: [name]     Urinary retention 07/26/2021   Weakness of both lower extremities 06/11/2016   Past Surgical History:  Procedure Laterality Date   APPENDECTOMY  1948   COLONOSCOPY     CORONARY STENT INTERVENTION N/A 05/01/2017   Procedure: CORONARY STENT INTERVENTION;  Surgeon: [name], [name], MD;  Location: [facility];  Service: Cardiovascular;  Laterality: N/A;   EYE SURGERY  2012   march and April  --Dr [name]   LEFT HEART CATH AND CORONARY ANGIOGRAPHY N/A 04/29/2017   Procedure: LEFT HEART CATH AND CORONARY ANGIOGRAPHY;  Surgeon: [name], MD;  Location: [facility];  Service: Cardiovascular;  Laterality: N/A;   PROSTATECTOMY     TONSILLECTOMY       Current Meds  Medication Sig   acetaminophen (TYLENOL) 500 MG tablet Take 1,000 mg by mouth every 6 (six) hours as needed for mild pain.   aspirin EC 81 MG tablet Take 1 tablet (81 mg total) by mouth daily at 12 noon.   atorvastatin (LIPITOR) 80 MG tablet TAKE ONE TABLET ('80MG'$  TOTAL) BY MOUTH DAILY   carboxymethylcellulose (REFRESH PLUS) 0.5 % SOLN Place 1 drop into both eyes daily as needed (dry eyes).   carvedilol (COREG) 3.125 MG tablet TAKE ONE TABLET (3.'125MG'$  TOTAL) BY MOUTHTWICE DAILY WITH A MEAL   levothyroxine (SYNTHROID) 88 MCG tablet TAKE ONE TABLET BY MOUTH ONCE DAILY   Multiple Vitamins-Minerals (PRESERVISION AREDS 2) CAPS Take 1 capsule by mouth in Evan Wright morning.   nitroGLYCERIN (NITROSTAT) 0.4 MG SL tablet Place 0.4 mg under Evan Wright tongue every 5 (five) minutes as needed for chest pain.   Omeprazole-Sodium Bicarbonate (ZEGERID OTC PO) Take 20 mg by mouth daily.   oxybutynin (DITROPAN) 5 MG tablet Take 5 mg by mouth daily.   PYRIDIUM 100 MG tablet Take 100 mg by mouth daily.   ranolazine (RANEXA) 500 MG  12 hr tablet TAKE ONE TABLET (500 MG TOTAL) BY MOUTH ONCE DAILY   Vitamin D, Ergocalciferol, 50000 units CAPS TAKE ONE CAPSULE (50000 UNITS TOTAL) BY MOUTH EVERY 7 DAYS     Allergies:   Lactose intolerance (gi) and Penicillins   Social History   Tobacco Use   Smoking status: Never   Smokeless tobacco: Never  Vaping Use   Vaping Use: Never used  Substance Use Topics   Alcohol use: Never   Drug use: No     Family Hx: Evan Wright patient's family history includes Colitis in his mother; Colon cancer in his brother; Lung cancer in his father; Prostate cancer in his father.  ROS:   Please see Evan Wright history of present illness.    Patient is doing is doing well. All other systems reviewed and are negative.   Prior CV studies:   Evan Wright following studies  were reviewed today:  I reviewed previous echocardiogram and catheter report.  Labs/Other Tests and Data Reviewed:    EKG:   Prior EKGs were reviewed.  Recent Labs: 07/27/2021: Magnesium 1.6 12/12/2021: ALT 10; BUN 40; Creatinine, Ser 2.14; Hemoglobin 12.9; Platelets 213.0; Potassium 4.1; Sodium 142 03/04/2022: TSH 0.82   Recent Lipid Panel Lab Results  Component Value Date/Time   CHOL 110 12/12/2021 09:59 AM   CHOL 122 04/26/2021 10:48 AM   TRIG 149.0 12/12/2021 09:59 AM   HDL 41.20 12/12/2021 09:59 AM   HDL 45 04/26/2021 10:48 AM   CHOLHDL 3 12/12/2021 09:59 AM   LDLCALC 39 12/12/2021 09:59 AM   LDLCALC 56 04/26/2021 10:48 AM   LDLDIRECT 165.3 10/08/2010 10:08 AM    Wt Readings from Last 3 Encounters:  10/17/21 143 lb (64.9 kg)  07/29/21 162 lb 0.6 oz (73.5 kg)  06/26/21 151 lb 6.4 oz (68.7 kg)     Risk Assessment/Calculations:          Objective:    Vital Signs:  Ht 6' (1.829 m)   BMI 19.39 kg/m    VITAL SIGNS:  reviewed GEN:  no acute distress  ASSESSMENT & PLAN:    Coronary artery disease: Stable at this time.  Patient ambulates age appropriately within normal and has no symptoms.  He is happy about it.  His  daughter confirms. Essential hypertension: Blood pressure is stable and diet was emphasized. Mixed hyperlipidemia: On lipid-lowering medications.  Lipids followed by primary care.  Patient mentions to me that they will talk to primary care about sending her home health nurse for blood work. Ascending aortic aneurysm: Stable.  Patient and family do not want to be further evaluated for this because he is not a candidate for surgery anyway.  He is frail and advanced age.  I respect their wishes.            Time:   Today, I have spent 20 minutes with Evan Wright patient with telehealth technology discussing Evan Wright above problems.     Medication Adjustments/Labs and Tests Ordered: Current medicines are reviewed at length with Evan Wright patient today.  Concerns regarding medicines are outlined above.   Tests Ordered: No orders of Evan Wright defined types were placed in this encounter.   Medication Changes: No orders of Evan Wright defined types were placed in this encounter.   Follow Up:  Virtual Visit   9 months  Signed, Jenean Lindau, MD  07/24/2022 3:15 PM    [place]

## 2022-07-27 ENCOUNTER — Other Ambulatory Visit: Payer: Self-pay | Admitting: Family Medicine

## 2022-07-27 ENCOUNTER — Other Ambulatory Visit: Payer: Self-pay | Admitting: Cardiology

## 2022-08-05 ENCOUNTER — Other Ambulatory Visit: Payer: Self-pay | Admitting: Cardiology

## 2022-08-06 ENCOUNTER — Ambulatory Visit: Payer: Medicare Other | Admitting: Podiatry

## 2022-08-07 ENCOUNTER — Other Ambulatory Visit: Payer: Self-pay | Admitting: Cardiology

## 2022-08-19 ENCOUNTER — Other Ambulatory Visit: Payer: Self-pay | Admitting: Cardiology

## 2022-08-30 ENCOUNTER — Other Ambulatory Visit: Payer: Self-pay | Admitting: Cardiology

## 2022-09-10 ENCOUNTER — Telehealth: Payer: Self-pay | Admitting: Family Medicine

## 2022-09-10 NOTE — Telephone Encounter (Signed)
Wyatt Portela with [employer] is following up on order for urologic supplies that requires provider signature. Fax received 09/05/22 and is in provider's box. Advised would send follow up message to provider for review.

## 2022-11-20 DEATH — deceased

## 2023-01-20 DEATH — deceased
